# Patient Record
Sex: Female | Born: 1995 | Race: Black or African American | Hispanic: No | Marital: Single | Smoking: Never smoker
Health system: Southern US, Community
[De-identification: ages and names within clinical notes are randomized; demographics above are authoritative.]

## PROBLEM LIST (undated history)

## (undated) ENCOUNTER — Inpatient Hospital Stay (HOSPITAL_COMMUNITY): Payer: Self-pay

## (undated) DIAGNOSIS — M810 Age-related osteoporosis without current pathological fracture: Secondary | ICD-10-CM

## (undated) DIAGNOSIS — K56609 Unspecified intestinal obstruction, unspecified as to partial versus complete obstruction: Secondary | ICD-10-CM

## (undated) DIAGNOSIS — K5090 Crohn's disease, unspecified, without complications: Secondary | ICD-10-CM

## (undated) DIAGNOSIS — H209 Unspecified iridocyclitis: Secondary | ICD-10-CM

## (undated) DIAGNOSIS — E559 Vitamin D deficiency, unspecified: Secondary | ICD-10-CM

## (undated) DIAGNOSIS — D649 Anemia, unspecified: Secondary | ICD-10-CM

## (undated) DIAGNOSIS — Z3400 Encounter for supervision of normal first pregnancy, unspecified trimester: Secondary | ICD-10-CM

## (undated) HISTORY — DX: Vitamin D deficiency, unspecified: E55.9

## (undated) HISTORY — PX: WISDOM TOOTH EXTRACTION: SHX21

## (undated) HISTORY — PX: INCISION AND DRAINAGE: SHX5863

## (undated) HISTORY — DX: Unspecified intestinal obstruction, unspecified as to partial versus complete obstruction: K56.609

## (undated) HISTORY — DX: Unspecified iridocyclitis: H20.9

## (undated) HISTORY — PX: SMALL INTESTINE SURGERY: SHX150

---
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
## 2013-01-23 LAB — OB RESULTS CONSOLE GC/CHLAMYDIA
Chlamydia: NEGATIVE
Gonorrhea: NEGATIVE

## 2013-01-23 LAB — OB RESULTS CONSOLE ABO/RH: RH Type: POSITIVE

## 2013-01-23 LAB — OB RESULTS CONSOLE VARICELLA ZOSTER ANTIBODY, IGG: Varicella: IMMUNE

## 2013-01-23 LAB — OB RESULTS CONSOLE TSH: TSH: 2.19

## 2013-01-23 LAB — OB RESULTS CONSOLE HIV ANTIBODY (ROUTINE TESTING): HIV: NONREACTIVE

## 2013-01-23 LAB — OB RESULTS CONSOLE RPR: RPR: NONREACTIVE

## 2013-01-23 LAB — OB RESULTS CONSOLE ANTIBODY SCREEN: Antibody Screen: NEGATIVE

## 2013-01-23 LAB — OB RESULTS CONSOLE HEPATITIS B SURFACE ANTIGEN: Hepatitis B Surface Ag: NEGATIVE

## 2013-01-23 LAB — OB RESULTS CONSOLE PLATELET COUNT: Platelets: 226 10*3/uL

## 2013-01-23 LAB — OB RESULTS CONSOLE HGB/HCT, BLOOD
HCT: 33 %
Hemoglobin: 11 g/dL

## 2013-01-23 LAB — OB RESULTS CONSOLE RUBELLA ANTIBODY, IGM: Rubella: UNDETERMINED

## 2013-04-27 ENCOUNTER — Encounter (HOSPITAL_COMMUNITY): Payer: Self-pay | Admitting: Emergency Medicine

## 2013-04-27 ENCOUNTER — Inpatient Hospital Stay (EMERGENCY_DEPARTMENT_HOSPITAL)
Admission: AD | Admit: 2013-04-27 | Discharge: 2013-04-27 | Disposition: A | Discharge status: Home / Self Care | Payer: Medicaid - Out of State | Source: Ambulatory Visit | Attending: Obstetrics & Gynecology | Admitting: Obstetrics & Gynecology

## 2013-04-27 ENCOUNTER — Encounter (HOSPITAL_COMMUNITY): Payer: Self-pay | Admitting: *Deleted

## 2013-04-27 ENCOUNTER — Emergency Department (HOSPITAL_COMMUNITY)
Admission: EM | Admit: 2013-04-27 | Discharge: 2013-04-27 | Disposition: A | Discharge status: Home / Self Care | Payer: Medicaid - Out of State | Attending: Emergency Medicine | Admitting: Emergency Medicine

## 2013-04-27 DIAGNOSIS — Y833 Surgical operation with formation of external stoma as the cause of abnormal reaction of the patient, or of later complication, without mention of misadventure at the time of the procedure: Secondary | ICD-10-CM

## 2013-04-27 DIAGNOSIS — O4700 False labor before 37 completed weeks of gestation, unspecified trimester: Secondary | ICD-10-CM

## 2013-04-27 DIAGNOSIS — O9989 Other specified diseases and conditions complicating pregnancy, childbirth and the puerperium: Secondary | ICD-10-CM | POA: Insufficient documentation

## 2013-04-27 DIAGNOSIS — O99891 Other specified diseases and conditions complicating pregnancy: Secondary | ICD-10-CM | POA: Insufficient documentation

## 2013-04-27 DIAGNOSIS — K9403 Colostomy malfunction: Secondary | ICD-10-CM | POA: Insufficient documentation

## 2013-04-27 DIAGNOSIS — K9413 Enterostomy malfunction: Secondary | ICD-10-CM | POA: Insufficient documentation

## 2013-04-27 DIAGNOSIS — K5090 Crohn's disease, unspecified, without complications: Secondary | ICD-10-CM | POA: Insufficient documentation

## 2013-04-27 DIAGNOSIS — K91858 Other complications of intestinal pouch: Secondary | ICD-10-CM | POA: Insufficient documentation

## 2013-04-27 DIAGNOSIS — L259 Unspecified contact dermatitis, unspecified cause: Secondary | ICD-10-CM | POA: Insufficient documentation

## 2013-04-27 DIAGNOSIS — Z8719 Personal history of other diseases of the digestive system: Secondary | ICD-10-CM | POA: Insufficient documentation

## 2013-04-27 DIAGNOSIS — L309 Dermatitis, unspecified: Secondary | ICD-10-CM

## 2013-04-27 HISTORY — DX: Crohn's disease, unspecified, without complications: K50.90

## 2013-04-27 LAB — URINE MICROSCOPIC-ADD ON

## 2013-04-27 LAB — URINALYSIS, ROUTINE W REFLEX MICROSCOPIC
Bilirubin Urine: NEGATIVE
Glucose, UA: NEGATIVE mg/dL
Hgb urine dipstick: NEGATIVE
Ketones, ur: NEGATIVE mg/dL
Nitrite: NEGATIVE
Protein, ur: NEGATIVE mg/dL
Specific Gravity, Urine: 1.025 (ref 1.005–1.030)
Urobilinogen, UA: 0.2 mg/dL (ref 0.0–1.0)
pH: 6 (ref 5.0–8.0)

## 2013-04-27 MED ORDER — LIDOCAINE HCL 2 % EX GEL
Freq: Once | CUTANEOUS | Status: AC
  Administered 2013-04-27: 10 via TOPICAL
  Filled 2013-04-27: qty 10

## 2013-04-27 NOTE — ED Notes (Signed)
Pt c/o pain to ostomy site x 1 week. Pt evaluated at [facility], and sent here for further evaluation, pt is 27 weeks preg.

## 2013-04-27 NOTE — MAU Provider Note (Signed)
History     CSN: [id number]  Arrival date and time: 04/27/13 0027   None     No chief complaint on file.  HPI Jessica [name] is an 17yo female G1P0 at 27.1wks who presents for eval of concerns re stoma site. She has an ostomy due to Crohn's Dx and states that there is now leaking x 2wks and possible bldg near the stoma. Also c/o superficial discomfort in her right abd above the stoma. Denies dysuria, ctx, leak or bldg. She moved here from [facility] x 2wks ago and is awaiting [place] MCD prior to establishing care.  OB History   Grav Para Term Preterm Abortions TAB SAB Ect Mult Living   1               Past Medical History  Diagnosis Date  . Crohn's disease     Past Surgical History  Procedure Laterality Date  . Small intestine surgery      Family History  Problem Relation Age of Onset  . Diabetes Mother   . Heart disease Mother   . Hypertension Mother   . Kidney disease Mother   . Cancer Father   . Hyperlipidemia Father   . Stroke Neg Hx     History  Substance Use Topics  . Smoking status: Never Smoker   . Smokeless tobacco: Not on file  . Alcohol Use: No    Allergies: No Known Allergies  Prescriptions prior to admission  Medication Sig Dispense Refill  . Prenatal Vit-Fe Fumarate-FA (MULTIVITAMIN-PRENATAL) 27-0.8 MG TABS Take 1 tablet by mouth daily at 12 noon.        ROS Physical Exam   Blood pressure 118/73, pulse 97, temperature 98.2 F (36.8 C), temperature source Oral, resp. rate 18, height 5' 5"  (1.651 m), weight 180 lb (81.647 kg).  Physical Exam  Constitutional: She is oriented to person, place, and time. She appears well-developed.  HENT:  Head: Normocephalic.  Cardiovascular: Normal rate.   Respiratory: Effort normal.  GI: Soft.  FHR 135 +accels, no decels, occ mi variables; appropriate for gest age No ctx per toco  Ostomy bag in RUQ, stoma pink, sm bldg noted  Genitourinary: Vagina normal.  Cx closed/long/high  Musculoskeletal: Normal range of  motion.  Neurological: She is alert and oriented to person, place, and time.  Skin: Skin is warm and dry.  Psychiatric: She has a normal mood and affect. Her behavior is normal. Thought content normal.   Urinalysis    Component Value Date/Time   COLORURINE YELLOW 04/27/2013 0040   APPEARANCEUR CLEAR 04/27/2013 0040   LABSPEC 1.025 04/27/2013 0040   PHURINE 6.0 04/27/2013 0040   GLUCOSEU NEGATIVE 04/27/2013 0040   HGBUR NEGATIVE 04/27/2013 0040   BILIRUBINUR NEGATIVE 04/27/2013 0040   KETONESUR NEGATIVE 04/27/2013 0040   PROTEINUR NEGATIVE 04/27/2013 0040   UROBILINOGEN 0.2 04/27/2013 0040   NITRITE NEGATIVE 04/27/2013 0040   LEUKOCYTESUR SMALL* 04/27/2013 0040      MAU Course  Procedures  MDM Rev'd with pt and mother that this facility is not equipped both in personnel nor proper equipment to evaluate and treat concerns re her stoma. Preterm labor has been ruled out and it has been recommended that she seek care at [facility] or [facility] for complete eval of stoma. They are in agreement and they decline [facility] tx.  Assessment and Plan  IUP at 27.1wks Stoma with bleeding  D/C from [facility] and rec she seek care at [facility] or [facility]  ERs for stoma concerns [facility] soon as she has  MCD established.  Kersten Salmons 04/27/2013, 1:24 AM

## 2013-04-27 NOTE — MAU Note (Signed)
Pt reports leaking around stoma site for 2 weeks and noticed some bleeding from around stoma tonight. Pt reports some sharp pain srounf the stoma site for one week.

## 2013-04-27 NOTE — ED Notes (Signed)
Lidocaine jelly applied to skin surrounding stoma, as directed by Collier Salina, PA

## 2013-04-27 NOTE — ED Provider Notes (Signed)
History     CSN: [id number]  Arrival date & time 04/27/13  0218   First MD Initiated Contact with Patient 04/27/13 [phone]      Chief Complaint  Patient presents with  . Ostomy pain    HPI  History provided by the patient. Patient is 17 year old female currently [redacted] weeks pregnant with past history of Crohn's disease and right lower ileostomy who presents with complaints of leakage and pain around her ostomy site. Patient has had increased leakage with poor seal of her ostomy bag for the past week. She has had increased irritation around the skin and stoma. Pain is described as a burning pain is persistent. She does not take any medications for this. [facility]. She was evaluated for her pregnancy which appears normal without any concerns for preterm labor. She denies any diffuse abdominal pain, pelvic pain, vaginal bleeding or vaginal discharge. There has been no fever, chills or sweats. No other aggravating or alleviating factors. No other associated symptoms.    Past Medical History  Diagnosis Date  . Crohn's disease     Past Surgical History  Procedure Laterality Date  . Small intestine surgery      Family History  Problem Relation Age of Onset  . Diabetes Mother   . Heart disease Mother   . Hypertension Mother   . Kidney disease Mother   . Cancer Father   . Hyperlipidemia Father   . Stroke Neg Hx     History  Substance Use Topics  . Smoking status: Never Smoker   . Smokeless tobacco: Not on file  . Alcohol Use: No    OB History   Grav Para Term Preterm Abortions TAB SAB Ect Mult Living   1               Review of Systems  Constitutional: Negative for fever, chills and diaphoresis.  Gastrointestinal: Negative for nausea and vomiting.  Genitourinary: Negative for vaginal bleeding and vaginal discharge.  All other systems reviewed and are negative.    Allergies  Review of patient's allergies  indicates no known allergies.  Home Medications   Current Outpatient Rx  Name  Route  Sig  Dispense  Refill  . Prenatal Vit-Fe Fumarate-FA (MULTIVITAMIN-PRENATAL) 27-0.8 MG TABS   Oral   Take 1 tablet by mouth daily at 12 noon.           BP 120/78  Pulse 96  Temp(Src) 99.2 F (37.3 C) (Oral)  Resp 18  Wt 181 lb (82.101 kg)  BMI 30.12 kg/m2  SpO2 100%  Physical Exam  Nursing note and vitals reviewed. Constitutional: She is oriented to person, place, and time. She appears well-developed and well-nourished. No distress.  HENT:  Head: Normocephalic.  Cardiovascular: Normal rate and regular rhythm.   Pulmonary/Chest: Effort normal and breath sounds normal.  Abdominal: Soft. There is no tenderness. There is no rebound and no guarding.  Gravid. Ostomy to the right lower abdomen with normal drainage. There is irritation and stoma dermatitis at inferior aspect and some of the surrounding skin of abdomen. No signs concerning for cellulitis.  Neurological: She is alert and oriented to person, place, and time.  Skin: Skin is warm and dry. No rash noted.  Psychiatric: She has a normal mood and affect. Her behavior is normal.    ED Course  Procedures      1. Stoma dermatitis       MDM  3:05AM she seen and evaluated. Patient well-appearing in no acute distress        Martie Lee, PA-C 04/27/13 [phone]

## 2013-04-28 NOTE — ED Provider Notes (Signed)
Medical screening examination/treatment/procedure(s) were performed by non-physician practitioner and as supervising physician I was immediately available for consultation/collaboration.   Sharyon Cable, MD 04/28/13 [phone]

## 2013-05-02 ENCOUNTER — Encounter: Payer: Self-pay | Admitting: Obstetrics & Gynecology

## 2013-05-02 NOTE — MAU Provider Note (Signed)
Pt needs to start prenatal care ASAP.  Will get her an appt and also MFM consult.

## 2013-05-03 ENCOUNTER — Ambulatory Visit (INDEPENDENT_AMBULATORY_CARE_PROVIDER_SITE_OTHER): Payer: Medicaid Other | Admitting: Obstetrics & Gynecology

## 2013-05-03 ENCOUNTER — Encounter: Payer: Self-pay | Admitting: Obstetrics & Gynecology

## 2013-05-03 VITALS — BP 116/79 | Temp 98.4°F | Wt 179.0 lb

## 2013-05-03 DIAGNOSIS — Z3400 Encounter for supervision of normal first pregnancy, unspecified trimester: Secondary | ICD-10-CM

## 2013-05-03 DIAGNOSIS — Z3403 Encounter for supervision of normal first pregnancy, third trimester: Secondary | ICD-10-CM

## 2013-05-03 DIAGNOSIS — Z932 Ileostomy status: Secondary | ICD-10-CM

## 2013-05-03 DIAGNOSIS — K5090 Crohn's disease, unspecified, without complications: Secondary | ICD-10-CM

## 2013-05-03 HISTORY — DX: Encounter for supervision of normal first pregnancy, unspecified trimester: Z34.00

## 2013-05-03 LAB — POCT URINALYSIS DIPSTICK
Bilirubin, UA: NEGATIVE
Blood, UA: NEGATIVE
Glucose, UA: NEGATIVE
Ketones, UA: NEGATIVE
Nitrite, UA: NEGATIVE
Protein, UA: NEGATIVE
Spec Grav, UA: 1.015
Urobilinogen, UA: NEGATIVE
pH, UA: 5

## 2013-05-03 NOTE — Progress Notes (Signed)
Doing well 

## 2013-05-03 NOTE — Patient Instructions (Addendum)
Glucose Tolerance Test This is a test to see how your body processes carbohydrates. This test is often done to check patients for diabetes or the possibility of developing it. PREPARATION FOR TEST You should have nothing to eat or drink 12 hours before the test. You will be given a form of sugar (glucose) and then blood samples will be drawn from your vein to determine the level of sugar in your blood. Alternatively, blood may be drawn from your finger for testing. You should not smoke or exercise during the test. NORMAL FINDINGS  Fasting: 70-115 mg/dL  30 minutes: less than 200 mg/dL  1 hour: less than 200 mg/dL  2 hours: less than 140 mg/dL  3 hours: 70-115 mg/dL  4 hours: 70-115 mg/dL Ranges for normal findings may vary among different laboratories and hospitals. You should always check with your doctor after having lab work or other tests done to discuss the meaning of your test results and whether your values are considered within normal limits. MEANING OF TEST Your caregiver will go over the test results with you and discuss the importance and meaning of your results, as well as treatment options and the need for additional tests. OBTAINING THE TEST RESULTS It is your responsibility to obtain your test results. Ask the lab or department performing the test when and how you will get your results. Document Released: 12/28/2004 Document Revised: 02/27/2012 Document Reviewed: 11/15/2008 [facility] Patient Information 2013 [place].

## 2013-05-03 NOTE — Progress Notes (Signed)
Pulse- 94  . Subjective:    MADA SADIK is being seen today for her first obstetrical visit.  This is not a planned pregnancy. She is at 33w0dgestation.  Relationship with FOB: significant other, not living together. Patient does not intend to breast feed. Pregnancy history fully reviewed.  Menstrual History: OB History   Grav Para Term Preterm Abortions TAB SAB Ect Mult Living   1               Menarche age: [age]No LMP recorded. Patient is pregnant.    The following portions of the patient's history were reviewed and updated as appropriate: allergies, current medications, past family history, past medical history, past social history, past surgical history and problem list.  Review of Systems Pertinent items are noted in HPI.    Objective:     Abd: ileostomy present      Assessment:    Pregnancy at 265w0deeks   H/O Crohn's disease w/an ileostomy; disease is quiescent; no medications Plan:   Review previous records Counseled re: TDAP/2 hr GTT Problem list reviewed and updated. Referral-->MFM U/S for growth ?Delivery route Follow up in 2 weeks. 50% of 20 min visit spent on counseling and coordination of care.

## 2013-05-06 ENCOUNTER — Encounter: Payer: Self-pay | Admitting: *Deleted

## 2013-05-06 ENCOUNTER — Encounter: Payer: Self-pay | Admitting: Obstetrics & Gynecology

## 2013-05-06 ENCOUNTER — Other Ambulatory Visit: Payer: Self-pay | Admitting: Obstetrics & Gynecology

## 2013-05-06 DIAGNOSIS — Z09 Encounter for follow-up examination after completed treatment for conditions other than malignant neoplasm: Secondary | ICD-10-CM

## 2013-05-06 NOTE — Progress Notes (Signed)
U/S SCHEDULED @ [facility]OM[facility] FOR 05/09/13

## 2013-05-08 ENCOUNTER — Other Ambulatory Visit: Payer: Medicaid Other | Admitting: *Deleted

## 2013-05-08 DIAGNOSIS — Z3402 Encounter for supervision of normal first pregnancy, second trimester: Secondary | ICD-10-CM

## 2013-05-08 DIAGNOSIS — Z3400 Encounter for supervision of normal first pregnancy, unspecified trimester: Secondary | ICD-10-CM

## 2013-05-08 LAB — CBC
HCT: 37 % (ref 36.0–46.0)
Hemoglobin: 12.4 g/dL (ref 12.0–15.0)
MCH: 28.6 pg (ref 26.0–34.0)
MCHC: 33.5 g/dL (ref 30.0–36.0)
MCV: 85.5 fL (ref 78.0–100.0)
Platelets: 220 10*3/uL (ref 150–400)
RBC: 4.33 MIL/uL (ref 3.87–5.11)
RDW: 16.3 % — ABNORMAL HIGH (ref 11.5–15.5)
WBC: 8.4 10*3/uL (ref 4.0–10.5)

## 2013-05-09 ENCOUNTER — Ambulatory Visit (HOSPITAL_COMMUNITY)
Admission: RE | Admit: 2013-05-09 | Discharge: 2013-05-09 | Disposition: A | Discharge status: Home / Self Care | Payer: Medicaid Other | Source: Ambulatory Visit | Attending: Obstetrics & Gynecology | Admitting: Obstetrics & Gynecology

## 2013-05-09 ENCOUNTER — Other Ambulatory Visit: Payer: Self-pay | Admitting: Obstetrics & Gynecology

## 2013-05-09 DIAGNOSIS — Z09 Encounter for follow-up examination after completed treatment for conditions other than malignant neoplasm: Secondary | ICD-10-CM

## 2013-05-09 DIAGNOSIS — Z3403 Encounter for supervision of normal first pregnancy, third trimester: Secondary | ICD-10-CM

## 2013-05-09 DIAGNOSIS — O99891 Other specified diseases and conditions complicating pregnancy: Secondary | ICD-10-CM | POA: Insufficient documentation

## 2013-05-09 DIAGNOSIS — Z932 Ileostomy status: Secondary | ICD-10-CM

## 2013-05-09 DIAGNOSIS — Z3689 Encounter for other specified antenatal screening: Secondary | ICD-10-CM | POA: Insufficient documentation

## 2013-05-09 DIAGNOSIS — K5090 Crohn's disease, unspecified, without complications: Secondary | ICD-10-CM | POA: Insufficient documentation

## 2013-05-09 LAB — RPR

## 2013-05-09 LAB — GLUCOSE TOLERANCE, 2 HOURS W/ 1HR
Glucose, 1 hour: 118 mg/dL (ref 70–170)
Glucose, 2 hour: 95 mg/dL (ref 70–139)
Glucose, Fasting: 52 mg/dL — ABNORMAL LOW (ref 70–99)

## 2013-05-09 LAB — HIV ANTIBODY (ROUTINE TESTING W REFLEX): HIV: NONREACTIVE

## 2013-05-14 ENCOUNTER — Institutional Professional Consult (permissible substitution): Payer: Medicaid Other

## 2013-05-15 ENCOUNTER — Encounter: Payer: Self-pay | Admitting: Obstetrics

## 2013-05-15 ENCOUNTER — Ambulatory Visit (INDEPENDENT_AMBULATORY_CARE_PROVIDER_SITE_OTHER): Payer: Medicaid Other | Admitting: Obstetrics

## 2013-05-15 ENCOUNTER — Telehealth: Payer: Self-pay | Admitting: *Deleted

## 2013-05-15 VITALS — BP 128/84 | Temp 98.4°F | Wt 181.4 lb

## 2013-05-15 DIAGNOSIS — Z3403 Encounter for supervision of normal first pregnancy, third trimester: Secondary | ICD-10-CM

## 2013-05-15 DIAGNOSIS — Z3400 Encounter for supervision of normal first pregnancy, unspecified trimester: Secondary | ICD-10-CM

## 2013-05-15 LAB — POCT URINALYSIS DIPSTICK
Bilirubin, UA: NEGATIVE
Blood, UA: NEGATIVE
Glucose, UA: NEGATIVE
Ketones, UA: NEGATIVE
Nitrite, UA: NEGATIVE
Spec Grav, UA: 1.02
Urobilinogen, UA: NEGATIVE
pH, UA: 5

## 2013-05-15 MED ORDER — OB COMPLETE PETITE 35-5-1-200 MG PO CAPS: 1.0000 | ORAL_CAPSULE | Freq: Every day | ORAL | Status: DC

## 2013-05-15 NOTE — Telephone Encounter (Signed)
error 

## 2013-05-15 NOTE — Progress Notes (Signed)
Pulse-105

## 2013-06-05 ENCOUNTER — Inpatient Hospital Stay (HOSPITAL_COMMUNITY)
Admission: AD | Admit: 2013-06-05 | Discharge: 2013-06-06 | Disposition: A | Discharge status: Home / Self Care | Payer: Medicaid Other | Source: Ambulatory Visit | Attending: Obstetrics & Gynecology | Admitting: Obstetrics & Gynecology

## 2013-06-05 ENCOUNTER — Encounter (HOSPITAL_COMMUNITY): Payer: Self-pay | Admitting: *Deleted

## 2013-06-05 DIAGNOSIS — R109 Unspecified abdominal pain: Secondary | ICD-10-CM | POA: Insufficient documentation

## 2013-06-05 DIAGNOSIS — B3731 Acute candidiasis of vulva and vagina: Secondary | ICD-10-CM | POA: Insufficient documentation

## 2013-06-05 DIAGNOSIS — O26899 Other specified pregnancy related conditions, unspecified trimester: Secondary | ICD-10-CM

## 2013-06-05 DIAGNOSIS — O2390 Unspecified genitourinary tract infection in pregnancy, unspecified trimester: Secondary | ICD-10-CM | POA: Insufficient documentation

## 2013-06-05 DIAGNOSIS — Z932 Ileostomy status: Secondary | ICD-10-CM

## 2013-06-05 DIAGNOSIS — B373 Candidiasis of vulva and vagina: Secondary | ICD-10-CM

## 2013-06-05 DIAGNOSIS — O212 Late vomiting of pregnancy: Secondary | ICD-10-CM | POA: Insufficient documentation

## 2013-06-05 NOTE — MAU Note (Signed)
PT SAYS SHE HAS AN APPOINTMENT AT 5 PM TOMORROW.   PT SAYS SHE HAS BEEN HAVING BAD LOWER ABD PAIN -  STARTED 2 WEEKS AGO-   HAS BEEN VOMITING  X1 WEEK- VERY SPIRADIC.   PT HAS ILEOSTOMY BAG- HAS CHRON'S  DISEASE.    LAST SEEN IN OFFICE - 2 WEEKS AGO- NO S/S  THEN.     TODAY-  NO VOMITING.   YESTERDAY-  VOMITED X2

## 2013-06-05 NOTE — MAU Provider Note (Signed)
History     CSN: [id number]  Arrival date and time: 06/05/13 2309   First Provider Initiated Contact with Patient 06/05/13 2341      No chief complaint on file.  HPI Ms. Jessica [name] is a 17 y.o. G1P0 at 53w6dwho presents to MAU today with N/V and abdominal cramping x 1 week. The patient states that the cramps are different from what she is used to with Crohns. She states some mucus discharge with blood on Thursday or Friday of last week that she thinks was a mucus plug. She states vomiting x 2 yesterday and none today. She denies bleeding since. She denies other discharge, LOF, contractions, fever, diarrhea, constipation of UTI symptoms. She reports good fetal movement.   OB History   Grav Para Term Preterm Abortions TAB SAB Ect Mult Living   1               Past Medical History  Diagnosis Date  . Crohn's disease     Past Surgical History  Procedure Laterality Date  . Small intestine surgery      Family History  Problem Relation Age of Onset  . Diabetes Father   . Stroke Neg Hx   . Diabetes Maternal Grandmother   . Heart disease Maternal Grandmother   . Hypertension Maternal Grandmother   . Kidney disease Maternal Grandmother   . Cancer Maternal Grandfather     lung  . Hyperlipidemia Maternal Grandfather     History  Substance Use Topics  . Smoking status: Never Smoker   . Smokeless tobacco: Not on file  . Alcohol Use: No    Allergies: No Known Allergies  Prescriptions prior to admission  Medication Sig Dispense Refill  . Prenat-FeCbn-FeAspGl-FA-Omega (OB COMPLETE PETITE) 35-5-1-200 MG CAPS Take 1 capsule by mouth daily before breakfast.  90 capsule  3  . Prenatal Vit-Fe Fumarate-FA (MULTIVITAMIN-PRENATAL) 27-0.8 MG TABS Take 1 tablet by mouth daily at 12 noon.        Review of Systems  Constitutional: Negative for fever and malaise/fatigue.  Gastrointestinal: Positive for nausea, vomiting and abdominal pain. Negative for constipation.   Genitourinary: Negative for dysuria, urgency and frequency.       + vaginal bleeding, discharge Neg - LOF   Physical Exam   Blood pressure 113/82, pulse 88, temperature 99.1 F (37.3 C), temperature source Oral, resp. rate 20, height 5' 6"  (1.676 m), weight 188 lb 2 oz (85.333 kg).  Physical Exam  Constitutional: She is oriented to person, place, and time. She appears well-developed and well-nourished. No distress.  HENT:  Head: Normocephalic and atraumatic.  Cardiovascular: Normal rate, regular rhythm and normal heart sounds.   Respiratory: Effort normal and breath sounds normal. No respiratory distress.  GI: Soft. Bowel sounds are normal. She exhibits no distension and no mass. Tenderness: very mild tenderness to palpation of the lower abdomen. There is no rebound and no guarding.  Genitourinary: Uterus is enlarged (appropriate for GA). Uterus is not tender. Cervix exhibits discharge (scant mucus discharge noted) and friability. Cervix exhibits no motion tenderness. No bleeding around the vagina. Vaginal discharge (moderate amount of thick, white discharge) found.  Neurological: She is alert and oriented to person, place, and time.  Skin: Skin is warm and dry. No erythema.  Psychiatric: She has a normal mood and affect.  Dilation: Closed Effacement (%): Thick Exam by:: Ashaya Raftery, PA  Results for orders placed during the hospital encounter of 06/05/13 (from the past 24 hour(s))  URINALYSIS, ROUTINE  W REFLEX MICROSCOPIC     Status: Abnormal   Collection Time    06/05/13 11:31 PM      Result Value Range   Color, Urine YELLOW  YELLOW   APPearance CLEAR  CLEAR   Specific Gravity, Urine 1.025  1.005 - 1.030   pH 6.0  5.0 - 8.0   Glucose, UA NEGATIVE  NEGATIVE mg/dL   Hgb urine dipstick NEGATIVE  NEGATIVE   Bilirubin Urine NEGATIVE  NEGATIVE   Ketones, ur 15 (*) NEGATIVE mg/dL   Protein, ur NEGATIVE  NEGATIVE mg/dL   Urobilinogen, UA 0.2  0.0 - 1.0 mg/dL   Nitrite NEGATIVE  NEGATIVE    Leukocytes, UA NEGATIVE  NEGATIVE  WET PREP, GENITAL     Status: Abnormal   Collection Time    06/05/13 11:55 PM      Result Value Range   Yeast Wet Prep HPF POC FEW (*) NONE SEEN   Trich, Wet Prep NONE SEEN  NONE SEEN   Clue Cells Wet Prep HPF POC NONE SEEN  NONE SEEN   WBC, Wet Prep HPF POC MODERATE (*) NONE SEEN    Fetal Monitoring: Baseline: 125 bpm, moderate variability, + accelerations, no decelerations Contractions: None  MAU Course  Procedures None  MDM UA, Wet prep, GC/Chlamydia today Diflucan given in MAU  Assessment and Plan  A: Nausea and vomiting in pregnancy Yeast vulvovaginitis  P: Discharge home Rx for Zofran sent to patient's pharmacy Patient encouraged to increase PO hydration as tolerated Patient encouraged to keep prenatal appointment for tomorrow Patient may return to MAU as needed  Farris Has, PA-C  06/06/2013, 12:24 AM

## 2013-06-06 ENCOUNTER — Ambulatory Visit (INDEPENDENT_AMBULATORY_CARE_PROVIDER_SITE_OTHER): Payer: Medicaid Other | Admitting: Obstetrics & Gynecology

## 2013-06-06 ENCOUNTER — Encounter: Payer: Self-pay | Admitting: Obstetrics & Gynecology

## 2013-06-06 VITALS — BP 119/79 | Temp 98.8°F | Wt 185.6 lb

## 2013-06-06 DIAGNOSIS — Z3400 Encounter for supervision of normal first pregnancy, unspecified trimester: Secondary | ICD-10-CM

## 2013-06-06 DIAGNOSIS — Z3403 Encounter for supervision of normal first pregnancy, third trimester: Secondary | ICD-10-CM

## 2013-06-06 LAB — URINALYSIS, ROUTINE W REFLEX MICROSCOPIC
Bilirubin Urine: NEGATIVE
Glucose, UA: NEGATIVE mg/dL
Hgb urine dipstick: NEGATIVE
Ketones, ur: 15 mg/dL — AB
Leukocytes, UA: NEGATIVE
Nitrite: NEGATIVE
Protein, ur: NEGATIVE mg/dL
Specific Gravity, Urine: 1.025 (ref 1.005–1.030)
Urobilinogen, UA: 0.2 mg/dL (ref 0.0–1.0)
pH: 6 (ref 5.0–8.0)

## 2013-06-06 LAB — POCT URINALYSIS DIPSTICK
Bilirubin, UA: NEGATIVE
Blood, UA: NEGATIVE
Glucose, UA: NEGATIVE
Ketones, UA: NEGATIVE
Leukocytes, UA: NEGATIVE
Nitrite, UA: NEGATIVE
Spec Grav, UA: 1.015
Urobilinogen, UA: NEGATIVE
pH, UA: 5

## 2013-06-06 LAB — WET PREP, GENITAL
Clue Cells Wet Prep HPF POC: NONE SEEN
Trich, Wet Prep: NONE SEEN

## 2013-06-06 MED ORDER — ONDANSETRON HCL 4 MG PO TABS: 4.0000 mg | ORAL_TABLET | Freq: Four times a day (QID) | ORAL | Status: DC

## 2013-06-06 MED ORDER — FLUCONAZOLE 150 MG PO TABS
150.0000 mg | ORAL_TABLET | Freq: Once | ORAL | Status: AC
  Administered 2013-06-06: 150 mg via ORAL
  Filled 2013-06-06: qty 1

## 2013-06-06 NOTE — Progress Notes (Signed)
Doing well 

## 2013-06-06 NOTE — Patient Instructions (Signed)

## 2013-06-06 NOTE — Progress Notes (Signed)
Pulse- 105.  Patient was treated yesterday for a yeast infection and dehydration at [facility].

## 2013-06-11 ENCOUNTER — Encounter: Payer: Self-pay | Admitting: Obstetrics & Gynecology

## 2013-06-20 ENCOUNTER — Ambulatory Visit (INDEPENDENT_AMBULATORY_CARE_PROVIDER_SITE_OTHER): Payer: Medicaid Other | Admitting: Obstetrics & Gynecology

## 2013-06-20 VITALS — BP 126/81 | Temp 98.6°F | Wt 189.0 lb

## 2013-06-20 DIAGNOSIS — Z3400 Encounter for supervision of normal first pregnancy, unspecified trimester: Secondary | ICD-10-CM

## 2013-06-20 DIAGNOSIS — Z3403 Encounter for supervision of normal first pregnancy, third trimester: Secondary | ICD-10-CM

## 2013-06-20 LAB — POCT URINALYSIS DIPSTICK
Bilirubin, UA: NEGATIVE
Blood, UA: NEGATIVE
Glucose, UA: NEGATIVE
Ketones, UA: NEGATIVE
Nitrite, UA: NEGATIVE
Protein, UA: NEGATIVE
Spec Grav, UA: 1.02
Urobilinogen, UA: NEGATIVE
pH, UA: 6

## 2013-06-20 NOTE — Patient Instructions (Addendum)
Patient information: Group B streptococcus and pregnancy (Beyond the Basics)  Authors Craig Guess, MD, PhD Arta Silence, MD Section Editors Kathrin Greathouse, MD Alton Revere, MD Deputy Editor Mardelle Matte, MD Disclosures  All topics are updated as new evidence becomes available and our peer review process is complete.  Literature review current through: Feb 2014.  This topic last updated: Jun 18, 2012.  INTRODUCTION - Group B streptococcus (GBS) is a bacterium that can cause serious infections in pregnant women and newborn babies. GBS is one of many types of streptococcal bacteria, sometimes called "strep." This article discusses GBS, its effect on pregnant women and infants, and ways to prevent complications of GBS. More detailed information about GBS is available by subscription. (See "Group B streptococcal infection in pregnant women".) WHAT IS GROUP B STREP INFECTION? - GBS is commonly found in the digestive system and the vagina. In healthy adults, GBS is not harmful and does not cause problems. But in pregnant women and newborn infants, being infected with GBS can cause serious illness. Approximately one in three to four pregnant women in the [place] carries GBS in their gastrointestinal system and/or in their vagina. Carrying GBS is not the same as being infected. Carriers are not sick and do not need treatment during pregnancy. There is no treatment that can stop you from carrying GBS.  Pregnant women who are carriers of GBS infrequently become infected with GBS. GBS can cause urinary tract infections, infection of the amniotic fluid (bag of water), and infection of the uterus after delivery. GBS infections during pregnancy may lead to preterm labor.  Pregnant women who carry GBS can pass on the bacteria to their newborns, and some of those babies become infected with GBS. Newborns who are infected with GBS can develop pneumonia (lung infection), septicemia (blood infection), or  meningitis (infection of the lining of the brain and spinal cord). These complications can be prevented by giving intravenous antibiotics during labor to any woman who is at risk of GBS infection. You are at risk of GBS infection if: You have a urine culture during your current pregnancy showing GBS  You have a vaginal and rectal culture during your current pregnancy showing GBS  You had an infant infected with GBS in the past GROUP B STREP PREVENTION - Most doctors and nurses recommend a urine culture early in your pregnancy to be sure that you do not have a bladder infection without symptoms. If you urine culture shows GBS or other bacteria, you may be treated with an antibiotic. If you have symptoms of urinary infection, such as pain with urination, any time during your pregnancy, a urine culture is done. If GBS grows from the urine culture, it should be treated with an antibiotic, and you should also receive intravenous antibiotics during labor. Expert groups recommend that all pregnant women have a GBS culture at 35 to 37 weeks of pregnancy. The culture is done by swabbing the vagina and rectum. If your GBS culture is positive, you will be given an intravenous antibiotic during labor. If you have preterm labor, the culture is done then and an intravenous antibiotic is given until the baby is born or the labor is stopped by your health care provider. If you have a positive GBS culture and you have an allergy to penicillin, be sure your doctor and nurse are aware of this allergy and tell them what happened with the allergy. If you had only a rash or itching, this  is not a serious allergy, and you can receive a common drug related to the penicillin. If you had a serious allergy (for example, trouble breathing, swelling of your face) you may need an additional test to determine which antibiotic should be used during labor. Being treated with an antibiotic during labor greatly reduces the chance that you or  your newborn will develop infections related to GBS. It is important to note that young infants up to age 19 months can also develop septicemia, meningitis and other serious infections from GBS. Being treated with an antibiotic during labor does not reduce the chance that your baby will develop this later type of infection. There is currently no known way of preventing this later-onset GBS disease. WHERE TO GET MORE INFORMATION - Your healthcare provider is the best source of information for questions and concerns related to your medical problem.

## 2013-06-20 NOTE — Progress Notes (Signed)
Pulse-92 No complaints

## 2013-06-24 ENCOUNTER — Encounter: Payer: Self-pay | Admitting: Obstetrics & Gynecology

## 2013-06-24 NOTE — Progress Notes (Signed)
Doing well 

## 2013-06-25 ENCOUNTER — Encounter: Payer: Self-pay | Admitting: Obstetrics

## 2013-06-25 ENCOUNTER — Ambulatory Visit (INDEPENDENT_AMBULATORY_CARE_PROVIDER_SITE_OTHER): Payer: Medicaid Other | Admitting: Obstetrics

## 2013-06-25 ENCOUNTER — Institutional Professional Consult (permissible substitution): Payer: Medicaid Other

## 2013-06-25 VITALS — BP 138/90 | Temp 98.3°F | Wt 190.0 lb

## 2013-06-25 DIAGNOSIS — Z3403 Encounter for supervision of normal first pregnancy, third trimester: Secondary | ICD-10-CM

## 2013-06-25 DIAGNOSIS — Z3400 Encounter for supervision of normal first pregnancy, unspecified trimester: Secondary | ICD-10-CM

## 2013-06-25 LAB — POCT URINALYSIS DIPSTICK
Bilirubin, UA: NEGATIVE
Blood, UA: NEGATIVE
Glucose, UA: NEGATIVE
Ketones, UA: NEGATIVE
Leukocytes, UA: NEGATIVE
Nitrite, UA: NEGATIVE
Spec Grav, UA: 1.015
Urobilinogen, UA: NEGATIVE
pH, UA: 7.5

## 2013-06-25 NOTE — Progress Notes (Signed)
Pulse-89 Pt c/o abdominal pain, pink after wiping this morning, and mucous discharge.

## 2013-06-25 NOTE — Progress Notes (Signed)
126/87 

## 2013-06-26 ENCOUNTER — Encounter: Payer: Self-pay | Admitting: Obstetrics & Gynecology

## 2013-06-27 ENCOUNTER — Encounter: Payer: Self-pay | Admitting: Obstetrics & Gynecology

## 2013-06-27 LAB — STREP B DNA PROBE: GBSP: NEGATIVE

## 2013-07-04 ENCOUNTER — Ambulatory Visit (INDEPENDENT_AMBULATORY_CARE_PROVIDER_SITE_OTHER): Payer: Medicaid Other | Admitting: Obstetrics & Gynecology

## 2013-07-04 VITALS — BP 133/89 | Temp 98.8°F | Wt 194.0 lb

## 2013-07-04 DIAGNOSIS — Z23 Encounter for immunization: Secondary | ICD-10-CM

## 2013-07-04 DIAGNOSIS — Z3400 Encounter for supervision of normal first pregnancy, unspecified trimester: Secondary | ICD-10-CM

## 2013-07-04 DIAGNOSIS — Z3403 Encounter for supervision of normal first pregnancy, third trimester: Secondary | ICD-10-CM

## 2013-07-04 LAB — POCT URINALYSIS DIPSTICK
Blood, UA: NEGATIVE
Glucose, UA: NEGATIVE
Ketones, UA: NEGATIVE
Nitrite, UA: NEGATIVE
Spec Grav, UA: 1.02
Urobilinogen, UA: NEGATIVE
pH, UA: 5

## 2013-07-04 NOTE — Progress Notes (Signed)
Pulse- 96

## 2013-07-08 ENCOUNTER — Encounter: Payer: Self-pay | Admitting: Obstetrics & Gynecology

## 2013-07-08 ENCOUNTER — Inpatient Hospital Stay (HOSPITAL_COMMUNITY): Payer: Medicaid Other

## 2013-07-08 ENCOUNTER — Ambulatory Visit (INDEPENDENT_AMBULATORY_CARE_PROVIDER_SITE_OTHER): Payer: Medicaid Other | Admitting: Obstetrics & Gynecology

## 2013-07-08 ENCOUNTER — Encounter (HOSPITAL_COMMUNITY): Payer: Self-pay | Admitting: *Deleted

## 2013-07-08 ENCOUNTER — Inpatient Hospital Stay (HOSPITAL_COMMUNITY)
Admission: AD | Admit: 2013-07-08 | Discharge: 2013-07-08 | Disposition: A | Discharge status: Home / Self Care | Payer: Medicaid Other | Source: Ambulatory Visit | Attending: Obstetrics | Admitting: Obstetrics

## 2013-07-08 VITALS — BP 137/96 | Temp 98.7°F | Wt 192.2 lb

## 2013-07-08 DIAGNOSIS — Z3400 Encounter for supervision of normal first pregnancy, unspecified trimester: Secondary | ICD-10-CM

## 2013-07-08 DIAGNOSIS — IMO0001 Reserved for inherently not codable concepts without codable children: Secondary | ICD-10-CM

## 2013-07-08 DIAGNOSIS — R51 Headache: Secondary | ICD-10-CM | POA: Insufficient documentation

## 2013-07-08 DIAGNOSIS — Z3403 Encounter for supervision of normal first pregnancy, third trimester: Secondary | ICD-10-CM

## 2013-07-08 DIAGNOSIS — O99891 Other specified diseases and conditions complicating pregnancy: Secondary | ICD-10-CM | POA: Insufficient documentation

## 2013-07-08 DIAGNOSIS — N949 Unspecified condition associated with female genital organs and menstrual cycle: Secondary | ICD-10-CM | POA: Insufficient documentation

## 2013-07-08 DIAGNOSIS — R030 Elevated blood-pressure reading, without diagnosis of hypertension: Secondary | ICD-10-CM

## 2013-07-08 LAB — US OB DETAIL + 14 WK

## 2013-07-08 LAB — CBC
HCT: 34.4 % — ABNORMAL LOW (ref 36.0–46.0)
Hemoglobin: 11.9 g/dL — ABNORMAL LOW (ref 12.0–15.0)
MCH: 29.9 pg (ref 26.0–34.0)
MCHC: 34.6 g/dL (ref 30.0–36.0)
MCV: 86.4 fL (ref 78.0–100.0)
Platelets: 188 10*3/uL (ref 150–400)
RBC: 3.98 MIL/uL (ref 3.87–5.11)
RDW: 13.8 % (ref 11.5–15.5)
WBC: 8.6 10*3/uL (ref 4.0–10.5)

## 2013-07-08 LAB — POCT URINALYSIS DIPSTICK
Bilirubin, UA: NEGATIVE
Blood, UA: NEGATIVE
Glucose, UA: NEGATIVE
Ketones, UA: NEGATIVE
Nitrite, UA: NEGATIVE
Spec Grav, UA: 1.02
Urobilinogen, UA: NEGATIVE
pH, UA: 6

## 2013-07-08 LAB — COMPREHENSIVE METABOLIC PANEL
ALT: 9 U/L (ref 0–35)
AST: 16 U/L (ref 0–37)
Albumin: 2.8 g/dL — ABNORMAL LOW (ref 3.5–5.2)
Alkaline Phosphatase: 129 U/L — ABNORMAL HIGH (ref 39–117)
BUN: 6 mg/dL (ref 6–23)
CO2: 19 mEq/L (ref 19–32)
Calcium: 9 mg/dL (ref 8.4–10.5)
Chloride: 102 mEq/L (ref 96–112)
Creatinine, Ser: 0.43 mg/dL — ABNORMAL LOW (ref 0.50–1.10)
GFR calc Af Amer: >90 mL/min (ref >90)
GFR calc non Af Amer: >90 mL/min (ref >90)
Glucose, Bld: 77 mg/dL (ref 70–99)
Potassium: 3.5 mEq/L (ref 3.5–5.1)
Sodium: 134 mEq/L — ABNORMAL LOW (ref 135–145)
Total Bilirubin: 0.2 mg/dL — ABNORMAL LOW (ref 0.3–1.2)
Total Protein: 6.5 g/dL (ref 6.0–8.3)

## 2013-07-08 LAB — PROTEIN / CREATININE RATIO, URINE
Creatinine, Urine: 263.68 mg/dL
Protein Creatinine Ratio: 0.16 — ABNORMAL HIGH (ref 0.00–0.15)
Total Protein, Urine: 43.1 mg/dL

## 2013-07-08 LAB — WET PREP, GENITAL
Clue Cells Wet Prep HPF POC: NONE SEEN
Trich, Wet Prep: NONE SEEN
Yeast Wet Prep HPF POC: NONE SEEN

## 2013-07-08 LAB — LACTATE DEHYDROGENASE: LDH: 125 U/L (ref 94–250)

## 2013-07-08 NOTE — MAU Provider Note (Signed)
Jessica [name] is a 17 y.o. G1P0 at 76w3dwho presents to MAU today from the office for evaluation of ROM and possible PIH. The patient complains of headache, but denies RUQ pain, peripheral edema, blurred vision, contractions or vaginal bleeding.   BP 120/85  Pulse 95  Temp(Src) 98.7 F (37.1 C)  Resp 16  Ht 5' 6.5" (1.689 m)  Wt 192 lb (87.091 kg)  BMI 30.53 kg/m2  SpO2 100% GENERAL: Well-developed, well-nourished female in no acute distress.  HEENT: Normocephalic, atraumatic.   LUNGS: Effort normal HEART: Regular rate  SKIN: Warm, dry and without erythema PSYCH: Normal mood and affect PELVIC: Normal external genitalia. Vagina is pink and ruggatted. Small amount of thin, white discharge noted.   Negative - pooling Negative - ferning  Fetal Monitoring: Baseline: 120 bpm, moderate variability, + accelerations, no decelerations Contractions: none  Results for orders placed during the hospital encounter of 07/08/13 (from the past 24 hour(s))  PROTEIN / CREATININE RATIO, URINE     Status: Abnormal   Collection Time    07/08/13 10:52 AM      Result Value Range   Creatinine, Urine 263.68     Total Protein, Urine 43.1     PROTEIN CREATININE RATIO 0.16 (*) 0.00 - 0.15  CBC     Status: Abnormal   Collection Time    07/08/13 11:30 AM      Result Value Range   WBC 8.6  4.0 - 10.5 K/uL   RBC 3.98  3.87 - 5.11 MIL/uL   Hemoglobin 11.9 (*) 12.0 - 15.0 g/dL   HCT 34.4 (*) 36.0 - 46.0 %   MCV 86.4  78.0 - 100.0 fL   MCH 29.9  26.0 - 34.0 pg   MCHC 34.6  30.0 - 36.0 g/dL   RDW 13.8  11.5 - 15.5 %   Platelets 188  150 - 400 K/uL  COMPREHENSIVE METABOLIC PANEL     Status: Abnormal   Collection Time    07/08/13 11:30 AM      Result Value Range   Sodium 134 (*) 135 - 145 mEq/L   Potassium 3.5  3.5 - 5.1 mEq/L   Chloride 102  96 - 112 mEq/L   CO2 19  19 - 32 mEq/L   Glucose, Bld 77  70 - 99 mg/dL   BUN 6  6 - 23 mg/dL   Creatinine, Ser 0.43 (*) 0.50 - 1.10 mg/dL   Calcium  9.0  8.4 - 10.5 mg/dL   Total Protein 6.5  6.0 - 8.3 g/dL   Albumin 2.8 (*) 3.5 - 5.2 g/dL   AST 16  0 - 37 U/L   ALT 9  0 - 35 U/L   Alkaline Phosphatase 129 (*) 39 - 117 U/L   Total Bilirubin 0.2 (*) 0.3 - 1.2 mg/dL   GFR calc non Af Amer >90  >90 mL/min   GFR calc Af Amer >90  >90 mL/min  LACTATE DEHYDROGENASE     Status: None   Collection Time    07/08/13 11:30 AM      Result Value Range   LDH 125  94 - 250 U/L  WET PREP, GENITAL     Status: Abnormal   Collection Time    07/08/13 11:35 AM      Result Value Range   Yeast Wet Prep HPF POC NONE SEEN  NONE SEEN   Trich, Wet Prep NONE SEEN  NONE SEEN   Clue Cells Wet Prep HPF  POC NONE SEEN  NONE SEEN   WBC, Wet Prep HPF POC FEW (*) NONE SEEN    Patient Vitals for the past 24 hrs:  BP Temp Pulse Resp SpO2 Height Weight  07/08/13 1401 134/82 mmHg - 76 16 - - -  07/08/13 1241 114/78 mmHg - - 16 - - -  07/08/13 1153 127/86 mmHg - 89 - - - -  07/08/13 1116 120/85 mmHg - 95 - - - -  07/08/13 1107 124/92 mmHg 98.7 F (37.1 C) 93 16 100 % 5' 6.5" (1.689 m) 192 lb (87.091 kg)  07/08/13 1102 - - - - - 5' 6"  (1.676 m) -    MDM Dr. Jodi Mourning called lab orders to MAU.  Patient is negative for ROM PIH work-up is essentially normal. Patient's BPs in MAU have all been acceptable.  Reactive NST and BPP 8/8 RN reports that she has attempted to contact Dr. Delsa Sale numerous times on different contact numbers without any answer or return call Patient has scheduled follow-up for routine prenatal care with Dr. Delsa Sale on Thursday.  Will discharge patient to home. Review labor precautions and s/s of pre-eclampsia prior to discharge  A: Vaginal discharge Headache  P: Discharge home Pre-eclampsia warning signs reviewed. Labor precautions reviewed.  Patient advised to take tylenol PRN for headache Patient instructed to keep scheduled follow-up with Dr. Delsa Sale as scheduled or call for earlier appointment if symptoms  worsen Patient may return to MAU as needed or if her condition were to change or worsen  Farris Has, PA-C 07/08/2013 11:36 AM

## 2013-07-08 NOTE — Progress Notes (Signed)
Pulse- 75

## 2013-07-08 NOTE — Discharge Instructions (Signed)
Hypertension During Pregnancy Hypertension is also called high blood pressure. Blood pressure moves blood in your body. Sometimes, the force that moves the blood becomes too strong. When you are pregnant, this condition should be watched carefully. It can cause problems for you and your baby. HOME CARE   Make and keep all of your doctor visits.  Take medicine as told by your doctor. Tell your doctor about all medicines you take.  Eat very little salt.  Exercise regularly.  Do not drink alcohol.  Do not smoke.  Do not have drinks with caffeine.  Lie on your left side when resting. GET HELP RIGHT AWAY IF:  You have bad belly (abdominal) pain.  You have sudden puffiness (swelling) in the hands, ankles, or face.  You gain 4 pounds (1.8 kilograms) or more in 1 week.  You throw up (vomit) repeatedly.  You have bleeding from the vagina.  You do not feel the baby moving as much.  You have a headache.  You have blurred or double vision.  You have muscle twitching or spasms.  You have shortness of breath.  You have blue fingernails and lips.  You have blood in your pee (urine). MAKE SURE YOU:  Understand these instructions.  Will watch your condition.  Will get help right away if you are not doing well. Document Released: 01/07/2011 Document Revised: 02/27/2012 Document Reviewed: 07/22/2011 [facility] Patient Information 2014 [place].

## 2013-07-08 NOTE — MAU Note (Signed)
Pt states was seen by Dr. Jodi Mourning this am, sent here for possible rupture of membranes. Also bp was elevated in office 130/96.

## 2013-07-11 ENCOUNTER — Ambulatory Visit (INDEPENDENT_AMBULATORY_CARE_PROVIDER_SITE_OTHER): Payer: Medicaid Other | Admitting: Obstetrics & Gynecology

## 2013-07-11 VITALS — BP 145/93 | Temp 98.3°F | Wt 196.0 lb

## 2013-07-11 DIAGNOSIS — O139 Gestational [pregnancy-induced] hypertension without significant proteinuria, unspecified trimester: Secondary | ICD-10-CM

## 2013-07-11 DIAGNOSIS — Z3400 Encounter for supervision of normal first pregnancy, unspecified trimester: Secondary | ICD-10-CM

## 2013-07-11 DIAGNOSIS — O133 Gestational [pregnancy-induced] hypertension without significant proteinuria, third trimester: Secondary | ICD-10-CM

## 2013-07-11 DIAGNOSIS — Z3403 Encounter for supervision of normal first pregnancy, third trimester: Secondary | ICD-10-CM

## 2013-07-11 LAB — POCT URINALYSIS DIPSTICK
Bilirubin, UA: NEGATIVE
Blood, UA: NEGATIVE
Glucose, UA: NEGATIVE
Ketones, UA: NEGATIVE
Leukocytes, UA: NEGATIVE
Nitrite, UA: NEGATIVE
Spec Grav, UA: <=1.005
Urobilinogen, UA: NEGATIVE
pH, UA: 6

## 2013-07-11 NOTE — Progress Notes (Signed)
Pulse- 83 Pt states she is having pain in her lower abdomen.

## 2013-07-12 ENCOUNTER — Other Ambulatory Visit: Payer: Self-pay | Admitting: Obstetrics & Gynecology

## 2013-07-12 ENCOUNTER — Ambulatory Visit (HOSPITAL_COMMUNITY)
Admission: RE | Admit: 2013-07-12 | Discharge: 2013-07-12 | Disposition: A | Discharge status: Home / Self Care | Payer: Medicaid Other | Source: Ambulatory Visit | Attending: Obstetrics & Gynecology | Admitting: Obstetrics & Gynecology

## 2013-07-12 DIAGNOSIS — I10 Essential (primary) hypertension: Secondary | ICD-10-CM

## 2013-07-12 DIAGNOSIS — O24419 Gestational diabetes mellitus in pregnancy, unspecified control: Secondary | ICD-10-CM

## 2013-07-12 DIAGNOSIS — O139 Gestational [pregnancy-induced] hypertension without significant proteinuria, unspecified trimester: Secondary | ICD-10-CM | POA: Insufficient documentation

## 2013-07-12 DIAGNOSIS — Z3689 Encounter for other specified antenatal screening: Secondary | ICD-10-CM | POA: Insufficient documentation

## 2013-07-14 ENCOUNTER — Encounter: Payer: Self-pay | Admitting: Obstetrics & Gynecology

## 2013-07-14 DIAGNOSIS — O133 Gestational [pregnancy-induced] hypertension without significant proteinuria, third trimester: Secondary | ICD-10-CM | POA: Insufficient documentation

## 2013-07-14 NOTE — Patient Instructions (Signed)

## 2013-07-14 NOTE — Progress Notes (Signed)
Likely gestational hypertension.  Will schedule IOL.

## 2013-07-15 ENCOUNTER — Encounter (HOSPITAL_COMMUNITY): Payer: Self-pay | Admitting: *Deleted

## 2013-07-15 ENCOUNTER — Inpatient Hospital Stay (HOSPITAL_COMMUNITY)
Admission: AD | Admit: 2013-07-15 | Discharge: 2013-07-18 | DRG: 775 | Disposition: A | Discharge status: Home / Self Care | Payer: Medicaid Other | Source: Ambulatory Visit | Attending: Obstetrics & Gynecology | Admitting: Obstetrics & Gynecology

## 2013-07-15 DIAGNOSIS — O139 Gestational [pregnancy-induced] hypertension without significant proteinuria, unspecified trimester: Principal | ICD-10-CM | POA: Diagnosis present

## 2013-07-15 DIAGNOSIS — Z932 Ileostomy status: Secondary | ICD-10-CM

## 2013-07-15 DIAGNOSIS — O133 Gestational [pregnancy-induced] hypertension without significant proteinuria, third trimester: Secondary | ICD-10-CM

## 2013-07-15 DIAGNOSIS — Z3403 Encounter for supervision of normal first pregnancy, third trimester: Secondary | ICD-10-CM

## 2013-07-15 LAB — URINALYSIS, ROUTINE W REFLEX MICROSCOPIC
Bilirubin Urine: NEGATIVE
Glucose, UA: NEGATIVE mg/dL
Ketones, ur: NEGATIVE mg/dL
Leukocytes, UA: NEGATIVE
Nitrite: NEGATIVE
Protein, ur: 30 mg/dL — AB
Specific Gravity, Urine: >1.03 — ABNORMAL HIGH (ref 1.005–1.030)
Urobilinogen, UA: 0.2 mg/dL (ref 0.0–1.0)
pH: 6.5 (ref 5.0–8.0)

## 2013-07-15 LAB — COMPREHENSIVE METABOLIC PANEL
ALT: 9 U/L (ref 0–35)
AST: 15 U/L (ref 0–37)
Albumin: 2.6 g/dL — ABNORMAL LOW (ref 3.5–5.2)
Alkaline Phosphatase: 138 U/L — ABNORMAL HIGH (ref 39–117)
BUN: 9 mg/dL (ref 6–23)
CO2: 21 mEq/L (ref 19–32)
Calcium: 9.3 mg/dL (ref 8.4–10.5)
Chloride: 105 mEq/L (ref 96–112)
Creatinine, Ser: 0.47 mg/dL — ABNORMAL LOW (ref 0.50–1.10)
GFR calc Af Amer: >90 mL/min (ref >90)
GFR calc non Af Amer: >90 mL/min (ref >90)
Glucose, Bld: 118 mg/dL — ABNORMAL HIGH (ref 70–99)
Potassium: 3.4 mEq/L — ABNORMAL LOW (ref 3.5–5.1)
Sodium: 136 mEq/L (ref 135–145)
Total Bilirubin: 0.2 mg/dL — ABNORMAL LOW (ref 0.3–1.2)
Total Protein: 6.1 g/dL (ref 6.0–8.3)

## 2013-07-15 LAB — CBC
HCT: 33.1 % — ABNORMAL LOW (ref 36.0–46.0)
HCT: 34.5 % — ABNORMAL LOW (ref 36.0–46.0)
Hemoglobin: 11.2 g/dL — ABNORMAL LOW (ref 12.0–15.0)
Hemoglobin: 11.9 g/dL — ABNORMAL LOW (ref 12.0–15.0)
MCH: 29.9 pg (ref 26.0–34.0)
MCH: 30 pg (ref 26.0–34.0)
MCHC: 33.8 g/dL (ref 30.0–36.0)
MCHC: 34.5 g/dL (ref 30.0–36.0)
MCV: 88.5 fL (ref 78.0–100.0)
MCV: 86.9 fL (ref 78.0–100.0)
Platelets: 199 10*3/uL (ref 150–400)
Platelets: 219 10*3/uL (ref 150–400)
RBC: 3.74 MIL/uL — ABNORMAL LOW (ref 3.87–5.11)
RBC: 3.97 MIL/uL (ref 3.87–5.11)
RDW: 13.4 % (ref 11.5–15.5)
RDW: 13.6 % (ref 11.5–15.5)
WBC: 8.3 10*3/uL (ref 4.0–10.5)
WBC: 8.3 10*3/uL (ref 4.0–10.5)

## 2013-07-15 LAB — TYPE AND SCREEN
ABO/RH(D): AB POS
Antibody Screen: NEGATIVE

## 2013-07-15 LAB — LACTATE DEHYDROGENASE: LDH: 112 U/L (ref 94–250)

## 2013-07-15 LAB — URINE MICROSCOPIC-ADD ON

## 2013-07-15 LAB — RPR: RPR Ser Ql: NONREACTIVE

## 2013-07-15 LAB — URIC ACID: Uric Acid, Serum: 4.2 mg/dL (ref 2.4–7.0)

## 2013-07-15 MED ORDER — LIDOCAINE HCL (PF) 1 % IJ SOLN
30.0000 mL | INTRAMUSCULAR | Status: DC | PRN
  Filled 2013-07-15: qty 30

## 2013-07-15 MED ORDER — TERBUTALINE SULFATE 1 MG/ML IJ SOLN: 0.2500 mg | Freq: Once | INTRAMUSCULAR | Status: AC | PRN

## 2013-07-15 MED ORDER — OXYTOCIN 40 UNITS IN LACTATED RINGERS INFUSION - SIMPLE MED
62.5000 mL/h | INTRAVENOUS | Status: DC
  Administered 2013-07-16: 999 mL/h via INTRAVENOUS
  Filled 2013-07-15: qty 1000

## 2013-07-15 MED ORDER — ACETAMINOPHEN 325 MG PO TABS: 650.0000 mg | ORAL_TABLET | ORAL | Status: DC | PRN

## 2013-07-15 MED ORDER — CITRIC ACID-SODIUM CITRATE 334-500 MG/5ML PO SOLN: 30.0000 mL | ORAL | Status: DC | PRN

## 2013-07-15 MED ORDER — FLEET ENEMA 7-19 GM/118ML RE ENEM: 1.0000 | ENEMA | RECTAL | Status: DC | PRN

## 2013-07-15 MED ORDER — OXYTOCIN BOLUS FROM INFUSION: 500.0000 mL | INTRAVENOUS | Status: DC

## 2013-07-15 MED ORDER — LACTATED RINGERS IV SOLN
INTRAVENOUS | Status: DC
  Administered 2013-07-15 – 2013-07-16 (×3): via INTRAVENOUS

## 2013-07-15 MED ORDER — IBUPROFEN 600 MG PO TABS
600.0000 mg | ORAL_TABLET | Freq: Four times a day (QID) | ORAL | Status: DC | PRN
  Administered 2013-07-16: 600 mg via ORAL
  Filled 2013-07-15: qty 1

## 2013-07-15 MED ORDER — LACTATED RINGERS IV SOLN: 500.0000 mL | INTRAVENOUS | Status: DC | PRN

## 2013-07-15 MED ORDER — MISOPROSTOL 25 MCG QUARTER TABLET
25.0000 ug | ORAL_TABLET | ORAL | Status: DC | PRN
  Administered 2013-07-15 – 2013-07-16 (×4): 25 ug via VAGINAL
  Filled 2013-07-15 (×4): qty 0.25

## 2013-07-15 MED ORDER — ONDANSETRON HCL 4 MG/2ML IJ SOLN
4.0000 mg | Freq: Four times a day (QID) | INTRAMUSCULAR | Status: DC | PRN
  Administered 2013-07-16: 4 mg via INTRAVENOUS
  Filled 2013-07-15: qty 2

## 2013-07-15 MED ORDER — OXYCODONE-ACETAMINOPHEN 5-325 MG PO TABS: 1.0000 | ORAL_TABLET | ORAL | Status: DC | PRN

## 2013-07-15 MED ORDER — BUTORPHANOL TARTRATE 1 MG/ML IJ SOLN
2.0000 mg | INTRAMUSCULAR | Status: DC | PRN
  Administered 2013-07-15 – 2013-07-16 (×2): 2 mg via INTRAVENOUS
  Filled 2013-07-15 (×4): qty 1

## 2013-07-15 NOTE — MAU Note (Signed)
Pt G1 at 38.3wks with gestational Hypertension,  This am BP 150/99.  Denies headache, blurred vision or swelling.

## 2013-07-15 NOTE — Progress Notes (Signed)
Called Dr. Delsa Sale to report cervical exam, uc pattern and pt wanting iv pain meds.  Orders received for iv pain medicine.

## 2013-07-15 NOTE — Progress Notes (Signed)
Not tracing well, pt in br voiding, tracing maternal

## 2013-07-15 NOTE — MAU Provider Note (Signed)
History     CSN: [id number]  Arrival date and time: 07/15/13 6378   First Provider Initiated Contact with Patient 07/15/13 0800      Chief Complaint  Patient presents with  . Hypertension   HPI Ms. Jessica [name] is a 17 y.o. G1P0 at 73w3dwho presents to MAU today with HTN. The patient states BP at home was150/99 this morning. Patient states that she called Dr. JDelsa Saleand was told to come in for evaluation and possible IOL. She denies headache, blurred vision, RUQ pain. She does have occasional LE edema that improves with rest and elevation. She endorses occasional contractions. She denies vaginal bleeding, discharge or LOF. She reports good fetal movement.   OB History   Grav Para Term Preterm Abortions TAB SAB Ect Mult Living   1               Past Medical History  Diagnosis Date  . Crohn's disease     Past Surgical History  Procedure Laterality Date  . Small intestine surgery      Family History  Problem Relation Age of Onset  . Diabetes Father   . Stroke Neg Hx   . Diabetes Maternal Grandmother   . Heart disease Maternal Grandmother   . Hypertension Maternal Grandmother   . Kidney disease Maternal Grandmother   . Cancer Maternal Grandfather     lung  . Hyperlipidemia Maternal Grandfather     History  Substance Use Topics  . Smoking status: Never Smoker   . Smokeless tobacco: Never Used  . Alcohol Use: No    Allergies: No Known Allergies  Prescriptions prior to admission  Medication Sig Dispense Refill  . acetaminophen (TYLENOL) 500 MG tablet Take 1,000 mg by mouth every 6 (six) hours as needed for pain.      .Marland Kitchenondansetron (ZOFRAN) 4 MG tablet Take 1 tablet (4 mg total) by mouth every 6 (six) hours.  12 tablet  0  . Prenatal Vit-Fe Fumarate-FA (PRENATAL MULTIVITAMIN) TABS Take 1 tablet by mouth daily at 12 noon.        Review of Systems  Gastrointestinal: Positive for abdominal pain.  Genitourinary: Negative for dysuria, urgency and  frequency.       Neg - vaginal bleeding, discharge, LOF   Physical Exam   Blood pressure 125/90.  Physical Exam  Constitutional: She is oriented to person, place, and time. She appears well-developed and well-nourished. No distress.  HENT:  Head: Normocephalic and atraumatic.  Cardiovascular: Normal rate, regular rhythm and normal heart sounds.   Respiratory: Effort normal and breath sounds normal. No respiratory distress.  GI: Soft. Bowel sounds are normal. She exhibits no distension and no mass. There is no tenderness. There is no rebound and no guarding.  Musculoskeletal: She exhibits no edema.  Neurological: She is alert and oriented to person, place, and time. She has normal reflexes.  No clonus  Skin: Skin is warm and dry. No erythema.  Psychiatric: She has a normal mood and affect.   Results for orders placed during the hospital encounter of 07/15/13 (from the past 24 hour(s))  URINALYSIS, ROUTINE W REFLEX MICROSCOPIC     Status: Abnormal   Collection Time    07/15/13  6:50 AM      Result Value Range   Color, Urine YELLOW  YELLOW   APPearance HAZY (*) CLEAR   Specific Gravity, Urine >1.030 (*) 1.005 - 1.030   pH 6.5  5.0 - 8.0   Glucose, UA  NEGATIVE  NEGATIVE mg/dL   Hgb urine dipstick TRACE (*) NEGATIVE   Bilirubin Urine NEGATIVE  NEGATIVE   Ketones, ur NEGATIVE  NEGATIVE mg/dL   Protein, ur 30 (*) NEGATIVE mg/dL   Urobilinogen, UA 0.2  0.0 - 1.0 mg/dL   Nitrite NEGATIVE  NEGATIVE   Leukocytes, UA NEGATIVE  NEGATIVE  URINE MICROSCOPIC-ADD ON     Status: Abnormal   Collection Time    07/15/13  6:50 AM      Result Value Range   Squamous Epithelial / LPF MANY (*) RARE   WBC, UA 0-2  <3 WBC/hpf   Bacteria, UA FEW (*) RARE   Urine-Other MUCOUS PRESENT    CBC     Status: Abnormal   Collection Time    07/15/13  7:59 AM      Result Value Range   WBC 8.3  4.0 - 10.5 K/uL   RBC 3.74 (*) 3.87 - 5.11 MIL/uL   Hemoglobin 11.2 (*) 12.0 - 15.0 g/dL   HCT 33.1 (*) 36.0 -  46.0 %   MCV 88.5  78.0 - 100.0 fL   MCH 29.9  26.0 - 34.0 pg   MCHC 33.8  30.0 - 36.0 g/dL   RDW 13.4  11.5 - 15.5 %   Platelets 199  150 - 400 K/uL  COMPREHENSIVE METABOLIC PANEL     Status: Abnormal (Preliminary result)   Collection Time    07/15/13  7:59 AM      Result Value Range   Sodium PENDING  135 - 145 mEq/L   Potassium PENDING  3.5 - 5.1 mEq/L   Chloride PENDING  96 - 112 mEq/L   CO2 21  19 - 32 mEq/L   Glucose, Bld 118 (*) 70 - 99 mg/dL   BUN 9  6 - 23 mg/dL   Creatinine, Ser 0.47 (*) 0.50 - 1.10 mg/dL   Calcium 9.3  8.4 - 10.5 mg/dL   Total Protein 6.1  6.0 - 8.3 g/dL   Albumin 2.6 (*) 3.5 - 5.2 g/dL   AST 15  0 - 37 U/L   ALT 9  0 - 35 U/L   Alkaline Phosphatase 138 (*) 39 - 117 U/L   Total Bilirubin 0.2 (*) 0.3 - 1.2 mg/dL   GFR calc non Af Amer >90  >90 mL/min   GFR calc Af Amer >90  >90 mL/min  URIC ACID     Status: None   Collection Time    07/15/13  7:59 AM      Result Value Range   Uric Acid, Serum 4.2  2.4 - 7.0 mg/dL  LACTATE DEHYDROGENASE     Status: None   Collection Time    07/15/13  7:59 AM      Result Value Range   LDH 112  94 - 250 U/L    MAU Course  Procedures None  MDM Discussed with Dr. Delsa Sale. Admit for IOL.   Assessment and Plan  A: Gestational HTN  P: Admit to L&D for IOL  Farris Has, PA-C  07/15/2013, 9:38 AM

## 2013-07-15 NOTE — Progress Notes (Signed)
Pt eating supper, ask pt to call when done eating so I can give IV pain medicine.  Explain to pt that she is to be on clear liquid diet from here out due to pain.  Pt verb. understanding

## 2013-07-15 NOTE — H&P (Signed)
Jessica [name] is a 17 y.o. female presenting for IOL. Maternal Medical History:  Reason for admission: The patient has had elevated B/Ps over the last several prenatal visits in the 140s/90s range.  Denied any neurological symptoms.  Antenatal testing including a recent growth scan have been reassuring.  Labs have not been consistent with a HELLP picture.  Fetal activity: Perceived fetal activity is normal.    Prenatal complications: See above  Prenatal Complications - Diabetes: none.    OB History   Grav Para Term Preterm Abortions TAB SAB Ect Mult Living   1              Past Medical History  Diagnosis Date  . Crohn's disease    Past Surgical History  Procedure Laterality Date  . Small intestine surgery     Family History: family history includes Cancer in her maternal grandfather; Diabetes in her father and maternal grandmother; Heart disease in her maternal grandmother; Hyperlipidemia in her maternal grandfather; Hypertension in her maternal grandmother; and Kidney disease in her maternal grandmother.  There is no history of Stroke. Social History:  reports that she has never smoked. She has never used smokeless tobacco. She reports that she does not drink alcohol or use illicit drugs.     Review of Systems  Constitutional: Negative for fever.  Eyes: Negative for blurred vision.  Respiratory: Negative for shortness of breath.   Gastrointestinal: Negative for vomiting.  Skin: Negative for rash.  Neurological: Negative for headaches.    Dilation: 1 Effacement (%): Thick Station: -3 Exam by:: Pearletha Forge, RN Blood pressure 143/99, pulse 70, temperature 98.2 F (36.8 C), temperature source Oral, resp. rate 18, height 5' 6.5" (1.689 m), weight 191 lb (86.637 kg). Maternal Exam:  Abdomen: Fetal presentation: vertex Stoma present  Introitus: Normal vulva. Pelvis: adequate for delivery.   Cervix: Cervix evaluated by digital exam.     Fetal Exam Fetal Monitor  Review: Variability: moderate (6-25 bpm).   Pattern: accelerations present and no decelerations.    Fetal State Assessment: Category I - tracings are normal.     Physical Exam  Constitutional: She appears well-developed.  HENT:  Head: Normocephalic.  Neck: Neck supple. No thyromegaly present.  Cardiovascular: Normal rate and regular rhythm.   Respiratory: Breath sounds normal.  GI: Soft. Bowel sounds are normal.  Skin: No rash noted.    Prenatal labs: ABO, Rh: --/--/AB POS, AB POS (07/28 1035) Antibody: NEG (07/28 1035) Rubella: Equivocal (02/05 0000) RPR: NON REACTIVE (07/28 1035)  HBsAg: Negative (02/05 0000)  HIV: NON REACTIVE (05/21 1012)  GBS: NEGATIVE (07/08 1538)   Results for orders placed during the hospital encounter of 07/15/13 (from the past 24 hour(s))  URINALYSIS, ROUTINE W REFLEX MICROSCOPIC     Status: Abnormal   Collection Time    07/15/13  6:50 AM      Result Value Range   Color, Urine YELLOW  YELLOW   APPearance HAZY (*) CLEAR   Specific Gravity, Urine >1.030 (*) 1.005 - 1.030   pH 6.5  5.0 - 8.0   Glucose, UA NEGATIVE  NEGATIVE mg/dL   Hgb urine dipstick TRACE (*) NEGATIVE   Bilirubin Urine NEGATIVE  NEGATIVE   Ketones, ur NEGATIVE  NEGATIVE mg/dL   Protein, ur 30 (*) NEGATIVE mg/dL   Urobilinogen, UA 0.2  0.0 - 1.0 mg/dL   Nitrite NEGATIVE  NEGATIVE   Leukocytes, UA NEGATIVE  NEGATIVE  URINE MICROSCOPIC-ADD ON     Status: Abnormal  Collection Time    07/15/13  6:50 AM      Result Value Range   Squamous Epithelial / LPF MANY (*) RARE   WBC, UA 0-2  <3 WBC/hpf   Bacteria, UA FEW (*) RARE   Urine-Other MUCOUS PRESENT    CBC     Status: Abnormal   Collection Time    07/15/13  7:59 AM      Result Value Range   WBC 8.3  4.0 - 10.5 K/uL   RBC 3.74 (*) 3.87 - 5.11 MIL/uL   Hemoglobin 11.2 (*) 12.0 - 15.0 g/dL   HCT 33.1 (*) 36.0 - 46.0 %   MCV 88.5  78.0 - 100.0 fL   MCH 29.9  26.0 - 34.0 pg   MCHC 33.8  30.0 - 36.0 g/dL   RDW 13.4  11.5 -  15.5 %   Platelets 199  150 - 400 K/uL  COMPREHENSIVE METABOLIC PANEL     Status: Abnormal   Collection Time    07/15/13  7:59 AM      Result Value Range   Sodium 136  135 - 145 mEq/L   Potassium 3.4 (*) 3.5 - 5.1 mEq/L   Chloride 105  96 - 112 mEq/L   CO2 21  19 - 32 mEq/L   Glucose, Bld 118 (*) 70 - 99 mg/dL   BUN 9  6 - 23 mg/dL   Creatinine, Ser 0.47 (*) 0.50 - 1.10 mg/dL   Calcium 9.3  8.4 - 10.5 mg/dL   Total Protein 6.1  6.0 - 8.3 g/dL   Albumin 2.6 (*) 3.5 - 5.2 g/dL   AST 15  0 - 37 U/L   ALT 9  0 - 35 U/L   Alkaline Phosphatase 138 (*) 39 - 117 U/L   Total Bilirubin 0.2 (*) 0.3 - 1.2 mg/dL   GFR calc non Af Amer >90  >90 mL/min   GFR calc Af Amer >90  >90 mL/min  URIC ACID     Status: None   Collection Time    07/15/13  7:59 AM      Result Value Range   Uric Acid, Serum 4.2  2.4 - 7.0 mg/dL  LACTATE DEHYDROGENASE     Status: None   Collection Time    07/15/13  7:59 AM      Result Value Range   LDH 112  94 - 250 U/L  CBC     Status: Abnormal   Collection Time    07/15/13 10:35 AM      Result Value Range   WBC 8.3  4.0 - 10.5 K/uL   RBC 3.97  3.87 - 5.11 MIL/uL   Hemoglobin 11.9 (*) 12.0 - 15.0 g/dL   HCT 34.5 (*) 36.0 - 46.0 %   MCV 86.9  78.0 - 100.0 fL   MCH 30.0  26.0 - 34.0 pg   MCHC 34.5  30.0 - 36.0 g/dL   RDW 13.6  11.5 - 15.5 %   Platelets 219  150 - 400 K/uL  RPR     Status: None   Collection Time    07/15/13 10:35 AM      Result Value Range   RPR NON REACTIVE  NON REACTIVE  TYPE AND SCREEN     Status: None   Collection Time    07/15/13 10:35 AM      Result Value Range   ABO/RH(D) AB POS     Antibody Screen NEG     Sample  Expiration 07/18/2013    ABO/RH     Status: None   Collection Time    07/15/13 10:35 AM      Result Value Range   ABO/RH(D) AB POS      Assessment/Plan: [place] @ [redacted]w[redacted]d  Gestational hypertension.  Unfavorable Bishop's score.  H/O Crohn's disease with an ileostomy Category I FHT  Admit Two-stage  IOL   JACKSON-MOORE,Denario Bagot A 07/15/2013, 8:26 PM

## 2013-07-16 ENCOUNTER — Inpatient Hospital Stay (HOSPITAL_COMMUNITY): Payer: Medicaid Other | Admitting: Anesthesiology

## 2013-07-16 ENCOUNTER — Encounter (HOSPITAL_COMMUNITY): Payer: Self-pay | Admitting: *Deleted

## 2013-07-16 ENCOUNTER — Encounter (HOSPITAL_COMMUNITY): Payer: Self-pay | Admitting: Anesthesiology

## 2013-07-16 LAB — CBC
HCT: 33.9 % — ABNORMAL LOW (ref 36.0–46.0)
HCT: 33.6 % — ABNORMAL LOW (ref 36.0–46.0)
Hemoglobin: 11.9 g/dL — ABNORMAL LOW (ref 12.0–15.0)
Hemoglobin: 11.6 g/dL — ABNORMAL LOW (ref 12.0–15.0)
MCH: 30.1 pg (ref 26.0–34.0)
MCH: 30.1 pg (ref 26.0–34.0)
MCHC: 35.1 g/dL (ref 30.0–36.0)
MCHC: 34.5 g/dL (ref 30.0–36.0)
MCV: 85.8 fL (ref 78.0–100.0)
MCV: 87 fL (ref 78.0–100.0)
Platelets: 297 10*3/uL (ref 150–400)
Platelets: 190 10*3/uL (ref 150–400)
RBC: 3.95 MIL/uL (ref 3.87–5.11)
RBC: 3.86 MIL/uL — ABNORMAL LOW (ref 3.87–5.11)
RDW: 13.9 % (ref 11.5–15.5)
RDW: 13.6 % (ref 11.5–15.5)
WBC: 11.8 10*3/uL — ABNORMAL HIGH (ref 4.0–10.5)
WBC: 13.5 10*3/uL — ABNORMAL HIGH (ref 4.0–10.5)

## 2013-07-16 LAB — ABO/RH: ABO/RH(D): AB POS

## 2013-07-16 MED ORDER — SENNOSIDES-DOCUSATE SODIUM 8.6-50 MG PO TABS: 2.0000 | ORAL_TABLET | Freq: Every day | ORAL | Status: DC

## 2013-07-16 MED ORDER — WITCH HAZEL-GLYCERIN EX PADS: 1.0000 "application " | MEDICATED_PAD | CUTANEOUS | Status: DC | PRN

## 2013-07-16 MED ORDER — OXYCODONE-ACETAMINOPHEN 5-325 MG PO TABS
1.0000 | ORAL_TABLET | ORAL | Status: DC | PRN
  Administered 2013-07-16 – 2013-07-17 (×2): 1 via ORAL
  Filled 2013-07-16 (×2): qty 1

## 2013-07-16 MED ORDER — LACTATED RINGERS IV SOLN
500.0000 mL | Freq: Once | INTRAVENOUS | Status: AC
Start: 2013-07-16 — End: 2013-07-16
  Administered 2013-07-16: 500 mL via INTRAVENOUS

## 2013-07-16 MED ORDER — LIDOCAINE HCL (PF) 1 % IJ SOLN
INTRAMUSCULAR | Status: DC | PRN
  Administered 2013-07-16 (×2): 5 mL

## 2013-07-16 MED ORDER — DIPHENHYDRAMINE HCL 25 MG PO CAPS: 25.0000 mg | ORAL_CAPSULE | Freq: Four times a day (QID) | ORAL | Status: DC | PRN

## 2013-07-16 MED ORDER — LANOLIN HYDROUS EX OINT: TOPICAL_OINTMENT | CUTANEOUS | Status: DC | PRN

## 2013-07-16 MED ORDER — SIMETHICONE 80 MG PO CHEW: 80.0000 mg | CHEWABLE_TABLET | ORAL | Status: DC | PRN

## 2013-07-16 MED ORDER — TETANUS-DIPHTH-ACELL PERTUSSIS 5-2.5-18.5 LF-MCG/0.5 IM SUSP: 0.5000 mL | Freq: Once | INTRAMUSCULAR | Status: DC

## 2013-07-16 MED ORDER — PHENYLEPHRINE 40 MCG/ML (10ML) SYRINGE FOR IV PUSH (FOR BLOOD PRESSURE SUPPORT): 80.0000 ug | PREFILLED_SYRINGE | INTRAVENOUS | Status: DC | PRN

## 2013-07-16 MED ORDER — EPHEDRINE 5 MG/ML INJ
10.0000 mg | INTRAVENOUS | Status: DC | PRN
  Filled 2013-07-16: qty 4

## 2013-07-16 MED ORDER — ZOLPIDEM TARTRATE 5 MG PO TABS: 5.0000 mg | ORAL_TABLET | Freq: Every evening | ORAL | Status: DC | PRN

## 2013-07-16 MED ORDER — BENZOCAINE-MENTHOL 20-0.5 % EX AERO
1.0000 "application " | INHALATION_SPRAY | CUTANEOUS | Status: DC | PRN
  Administered 2013-07-16: 1 via TOPICAL
  Filled 2013-07-16: qty 56

## 2013-07-16 MED ORDER — DIPHENHYDRAMINE HCL 50 MG/ML IJ SOLN: 12.5000 mg | INTRAMUSCULAR | Status: DC | PRN

## 2013-07-16 MED ORDER — OXYTOCIN 40 UNITS IN LACTATED RINGERS INFUSION - SIMPLE MED: 62.5000 mL/h | INTRAVENOUS | Status: DC | PRN

## 2013-07-16 MED ORDER — OXYTOCIN 40 UNITS IN LACTATED RINGERS INFUSION - SIMPLE MED
1.0000 m[IU]/min | INTRAVENOUS | Status: DC
  Administered 2013-07-16: 1 m[IU]/min via INTRAVENOUS

## 2013-07-16 MED ORDER — FENTANYL 2.5 MCG/ML BUPIVACAINE 1/10 % EPIDURAL INFUSION (WH - ANES)
14.0000 mL/h | INTRAMUSCULAR | Status: DC | PRN
  Administered 2013-07-16 (×2): 14 mL/h via EPIDURAL
  Filled 2013-07-16 (×2): qty 125

## 2013-07-16 MED ORDER — PRENATAL MULTIVITAMIN CH
1.0000 | ORAL_TABLET | Freq: Every day | ORAL | Status: DC
  Administered 2013-07-17: 1 via ORAL
  Filled 2013-07-16 (×2): qty 1

## 2013-07-16 MED ORDER — IBUPROFEN 600 MG PO TABS
600.0000 mg | ORAL_TABLET | Freq: Four times a day (QID) | ORAL | Status: DC
  Administered 2013-07-16 – 2013-07-18 (×7): 600 mg via ORAL
  Filled 2013-07-16 (×7): qty 1

## 2013-07-16 MED ORDER — ONDANSETRON HCL 4 MG/2ML IJ SOLN: 4.0000 mg | INTRAMUSCULAR | Status: DC | PRN

## 2013-07-16 MED ORDER — EPHEDRINE 5 MG/ML INJ: 10.0000 mg | INTRAVENOUS | Status: DC | PRN

## 2013-07-16 MED ORDER — ONDANSETRON HCL 4 MG PO TABS: 4.0000 mg | ORAL_TABLET | ORAL | Status: DC | PRN

## 2013-07-16 MED ORDER — DIBUCAINE 1 % RE OINT: 1.0000 "application " | TOPICAL_OINTMENT | RECTAL | Status: DC | PRN

## 2013-07-16 MED ORDER — TERBUTALINE SULFATE 1 MG/ML IJ SOLN: 0.2500 mg | Freq: Once | INTRAMUSCULAR | Status: DC | PRN

## 2013-07-16 MED ORDER — PHENYLEPHRINE 40 MCG/ML (10ML) SYRINGE FOR IV PUSH (FOR BLOOD PRESSURE SUPPORT)
80.0000 ug | PREFILLED_SYRINGE | INTRAVENOUS | Status: DC | PRN
  Filled 2013-07-16: qty 5

## 2013-07-16 MED ORDER — MEDROXYPROGESTERONE ACETATE 150 MG/ML IM SUSP: 150.0000 mg | INTRAMUSCULAR | Status: DC | PRN

## 2013-07-16 NOTE — Progress Notes (Signed)
Jessica [name] is a 17 y.o. G1P0 at 28w4dby LMP admitted for induction of labor due to Hypertension.  Subjective:   Objective: BP 136/94  Pulse 72  Temp(Src) 98.4 F (36.9 C) (Oral)  Resp 16  Ht 5' 6.5" (1.689 m)  Wt 191 lb (86.637 kg)  BMI 30.37 kg/m2  SpO2 99%      FHT:  FHR: 150 bpm, variability: moderate,  accelerations:  Present,  decelerations:  Absent UC:   irregular, every 3-6 minutes SVE:   Dilation: 3 Effacement (%): 90 Station: -2 Exam by:: SCarter KittenRN  Labs: Lab Results  Component Value Date   WBC 11.8* 07/16/2013   HGB 11.9* 07/16/2013   HCT 33.9* 07/16/2013   MCV 85.8 07/16/2013   PLT 297 07/16/2013    Assessment / Plan: 38 weeks.  HTN.  2 stage IOL.  Good response to Cytotec cervical ripening.  Start low dose pitocin.  Labor: Latent phase Preeclampsia:  labs stable Fetal Wellbeing:  Category I Pain Control:  Epidural I/D:  n/a Anticipated MOD:  NSVD  Ghassan Coggeshall A 07/16/2013, 9:49 AM

## 2013-07-16 NOTE — Anesthesia Procedure Notes (Signed)
Epidural Patient location during procedure: OB Start time: 07/16/2013 5:54 AM  Staffing Anesthesiologist: Royce Macadamia., Mickle Asper. Performed by: anesthesiologist   Preanesthetic Checklist Completed: patient identified, site marked, surgical consent, pre-op evaluation, timeout performed, IV checked, risks and benefits discussed and monitors and equipment checked  Epidural Patient position: sitting Prep: site prepped and draped and DuraPrep Patient monitoring: continuous pulse ox and blood pressure Approach: midline Injection technique: LOR air and LOR saline  Needle:  Needle type: Tuohy  Needle gauge: 17 G Needle length: 9 cm and 9 Needle insertion depth: 6 cm Catheter type: closed end flexible Catheter size: 19 Gauge Catheter at skin depth: 12 cm Test dose: negative  Assessment Events: blood not aspirated, injection not painful, no injection resistance, negative IV test and no paresthesia  Additional Notes Patient identified.  Risk benefits discussed including failed block, incomplete pain control, headache, nerve damage, paralysis, blood pressure changes, nausea, vomiting, reactions to medication both toxic or allergic, and postpartum back pain.  Patient expressed understanding and wished to proceed.  All questions were answered.  Sterile technique used throughout procedure and epidural site dressed with sterile barrier dressing. No paresthesia or other complications noted.The patient did not experience any signs of intravascular injection such as tinnitus or metallic taste in mouth nor signs of intrathecal spread such as rapid motor block. Please see nursing notes for vital signs.

## 2013-07-16 NOTE — Progress Notes (Signed)
Jessica [name] is a 17 y.o. G1P0 at 82w4dby LMP admitted for induction of labor due to Hypertension.  Subjective:   Objective: BP 146/87  Pulse 65  Temp(Src) 99 F (37.2 C) (Oral)  Resp 16  Ht 5' 6.5" (1.689 m)  Wt 191 lb (86.637 kg)  BMI 30.37 kg/m2  SpO2 99%   Total I/O In: -  Out: 300 [Urine:300]  FHT:  FHR: 150 bpm, variability: moderate,  accelerations:  Present,  decelerations:  Absent UC:   regular, every 3 minutes SVE:   Dilation: 10 Effacement (%): 100 Station: +2 Exam by:: SCarter KittenRN  Labs: Lab Results  Component Value Date   WBC 11.8* 07/16/2013   HGB 11.9* 07/16/2013   HCT 33.9* 07/16/2013   MCV 85.8 07/16/2013   PLT 297 07/16/2013    Assessment / Plan: Induction of labor due to gestational hypertension,  progressing well on pitocin  Labor: Progressing normally Preeclampsia:  labs stable Fetal Wellbeing:  Category I Pain Control:  Epidural I/D:  n/a Anticipated MOD:  NSVD  Blayn Whetsell A 07/16/2013, 1:44 PM

## 2013-07-16 NOTE — Anesthesia Preprocedure Evaluation (Signed)
Anesthesia Evaluation  Patient identified by MRN, date of birth, ID band Patient awake    Reviewed: Allergy & Precautions, H&P , Patient's Chart, lab work & pertinent test results  Airway Mallampati: II TM Distance: >3 FB Neck ROM: full    Dental no notable dental hx.    Pulmonary neg pulmonary ROS,  breath sounds clear to auscultation  Pulmonary exam normal       Cardiovascular hypertension, negative cardio ROS  Rhythm:regular Rate:Normal     Neuro/Psych negative neurological ROS  negative psych ROS   GI/Hepatic negative GI ROS, Neg liver ROS,   Endo/Other  negative endocrine ROS  Renal/GU negative Renal ROS     Musculoskeletal   Abdominal   Peds  Hematology negative hematology ROS (+)   Anesthesia Other Findings crohns  Reproductive/Obstetrics (+) Pregnancy                           Anesthesia Physical Anesthesia Plan  ASA: III  Anesthesia Plan: Epidural   Post-op Pain Management:    Induction:   Airway Management Planned:   Additional Equipment:   Intra-op Plan:   Post-operative Plan:   Informed Consent: I have reviewed the patients History and Physical, chart, labs and discussed the procedure including the risks, benefits and alternatives for the proposed anesthesia with the patient or authorized representative who has indicated his/her understanding and acceptance.     Plan Discussed with:   Anesthesia Plan Comments:         Anesthesia Quick Evaluation

## 2013-07-17 ENCOUNTER — Inpatient Hospital Stay (HOSPITAL_COMMUNITY): Admission: RE | Admit: 2013-07-17 | Payer: Medicaid Other | Source: Ambulatory Visit

## 2013-07-17 LAB — CBC
HCT: 29.1 % — ABNORMAL LOW (ref 36.0–46.0)
Hemoglobin: 9.9 g/dL — ABNORMAL LOW (ref 12.0–15.0)
MCH: 29.8 pg (ref 26.0–34.0)
MCHC: 34 g/dL (ref 30.0–36.0)
MCV: 87.7 fL (ref 78.0–100.0)
Platelets: 158 10*3/uL (ref 150–400)
RBC: 3.32 MIL/uL — ABNORMAL LOW (ref 3.87–5.11)
RDW: 13.7 % (ref 11.5–15.5)
WBC: 9 10*3/uL (ref 4.0–10.5)

## 2013-07-17 NOTE — Anesthesia Postprocedure Evaluation (Signed)
  Anesthesia Post Note  Patient: Jessica [name]  Procedure(s) Performed: * No procedures listed *  Anesthesia type: Epidural  Patient location: Mother/Baby  Post pain: Pain level controlled  Post assessment: Post-op Vital signs reviewed  Last Vitals:  Filed Vitals:   07/16/13 1945  BP: 143/91  Pulse: 79  Temp: 36.4 C  Resp: 18    Post vital signs: Reviewed  Level of consciousness: awake  Complications: No apparent anesthesia complications

## 2013-07-17 NOTE — Anesthesia Postprocedure Evaluation (Signed)
  Anesthesia Post-op Note  Anesthesia Post Note  Patient: Jessica [name]  Procedure(s) Performed: * No procedures listed *  Anesthesia type: Epidural  Patient location: Mother/Baby  Post pain: Pain level controlled  Post assessment: Post-op Vital signs reviewed  Last Vitals:  Filed Vitals:   07/17/13 0528  BP: 118/80  Pulse: 75  Temp: 36.3 C  Resp: 18    Post vital signs: Reviewed  Level of consciousness:alert  Complications: No apparent anesthesia complications

## 2013-07-17 NOTE — Progress Notes (Signed)
Post Partum Day 1 Subjective: no complaints  Objective: Blood pressure 118/80, pulse 75, temperature 97.4 F (36.3 C), temperature source Oral, resp. rate 18, height 5' 6.5" (1.689 m), weight 191 lb (86.637 kg), SpO2 99.00%, unknown if currently breastfeeding.  Physical Exam:  General: alert and no distress Lochia: appropriate Uterine Fundus: firm Incision: healing well DVT Evaluation: No evidence of DVT seen on physical exam.   Recent Labs  07/16/13 1342 07/17/13 0630  HGB 11.6* 9.9*  HCT 33.6* 29.1*    Assessment/Plan: Plan for discharge tomorrow   LOS: 2 days   Travius Crochet A 07/17/2013, 8:03 AM

## 2013-07-17 NOTE — Progress Notes (Signed)
UR chart review completed.  

## 2013-07-18 MED ORDER — IBUPROFEN 600 MG PO TABS: 600.0000 mg | ORAL_TABLET | Freq: Four times a day (QID) | ORAL | Status: DC | PRN

## 2013-07-18 MED ORDER — OXYCODONE-ACETAMINOPHEN 5-325 MG PO TABS: 1.0000 | ORAL_TABLET | ORAL | Status: DC | PRN

## 2013-07-18 NOTE — Discharge Summary (Signed)
Obstetric Discharge Summary Reason for Admission: induction of labor Prenatal Procedures: ultrasound Intrapartum Procedures: spontaneous vaginal delivery Postpartum Procedures: none Complications-Operative and Postpartum: none Hemoglobin  Date Value Range Status  07/17/2013 9.9* 12.0 - 15.0 g/dL Final  01/23/2013 11.0   Final     HCT  Date Value Range Status  07/17/2013 29.1* 36.0 - 46.0 % Final  01/23/2013 33   Final    Physical Exam:  General: alert and no distress Lochia: appropriate Uterine Fundus: firm Incision: healing well DVT Evaluation: No evidence of DVT seen on physical exam.  Discharge Diagnoses: Term Pregnancy-delivered  Discharge Information: Date: 07/18/2013 Activity: pelvic rest Diet: routine Medications: PNV, Ibuprofen, Colace and Percocet Condition: stable Instructions: refer to practice specific booklet Discharge to: home Follow-up Information   Follow up with Lahoma Crocker A, MD. Schedule an appointment as soon as possible for a visit in 2 weeks.   Contact information:   [address] [place] [address] [phone]       Newborn Data: Live born female  Birth Weight: 6 lb 5.8 oz (2885 g) APGAR: 9, 9  Home with mother.  Kylena Mole A 07/18/2013, 8:22 AM

## 2013-07-18 NOTE — Progress Notes (Signed)
Post Partum Day 2 Subjective: no complaints  Objective: Blood pressure 124/88, pulse 67, temperature 98.2 F (36.8 C), temperature source Oral, resp. rate 16, height 5' 6.5" (1.689 m), weight 191 lb (86.637 kg), SpO2 100.00%, unknown if currently breastfeeding.  Physical Exam:  General: alert and no distress Lochia: appropriate Uterine Fundus: firm Incision: healing well DVT Evaluation: No evidence of DVT seen on physical exam.   Recent Labs  07/16/13 1342 07/17/13 0630  HGB 11.6* 9.9*  HCT 33.6* 29.1*    Assessment/Plan: Discharge home   LOS: 3 days   Shereda Graw A 07/18/2013, 8:17 AM

## 2013-07-18 NOTE — Progress Notes (Signed)
Discussed with pt rubella status and that she was equivocal, pt declined MMR at this time

## 2013-07-26 ENCOUNTER — Encounter: Payer: Self-pay | Admitting: Obstetrics & Gynecology

## 2013-07-26 NOTE — Progress Notes (Signed)
Doing well 

## 2013-08-01 ENCOUNTER — Ambulatory Visit: Payer: Medicaid Other | Admitting: Obstetrics & Gynecology

## 2013-08-08 ENCOUNTER — Encounter: Payer: Self-pay | Admitting: Obstetrics & Gynecology

## 2013-08-22 ENCOUNTER — Ambulatory Visit: Payer: Medicaid Other | Admitting: Obstetrics & Gynecology

## 2013-11-03 ENCOUNTER — Emergency Department (HOSPITAL_COMMUNITY)
Admission: EM | Admit: 2013-11-03 | Discharge: 2013-11-03 | Disposition: A | Discharge status: Home / Self Care | Payer: Medicaid Other | Attending: Emergency Medicine | Admitting: Emergency Medicine

## 2013-11-03 ENCOUNTER — Encounter (HOSPITAL_COMMUNITY): Payer: Self-pay | Admitting: Emergency Medicine

## 2013-11-03 DIAGNOSIS — Z8719 Personal history of other diseases of the digestive system: Secondary | ICD-10-CM | POA: Insufficient documentation

## 2013-11-03 DIAGNOSIS — R040 Epistaxis: Secondary | ICD-10-CM | POA: Insufficient documentation

## 2013-11-03 NOTE — ED Notes (Signed)
Pt reports she started having a nosebleed about 30 minutes ago. Reports that it lasted a couple of minutes and placed pressure on it to stop the bleeding. No hx of nosebleed. No bleeding a triage. Skin warm and dry

## 2013-11-03 NOTE — ED Provider Notes (Signed)
CSN: [id number]     Arrival date & time 11/03/13  1453 History  This chart was scribed for non-physician practitioner, Glendell Docker, NP working with Shaune Pollack, MD by Frederich Balding, ED scribe. This patient was seen in room TR11C/TR11C and the patient's care was started at 3:47 PM.   Chief Complaint  Patient presents with  . Epistaxis   The history is provided by the patient. No language interpreter was used.   HPI Comments: Jessica [name] is a 17 y.o. female who presents to the Emergency Department complaining of epistaxis that started 30 minutes ago. She states she placed pressure on it to stop the bleeding with relief after 10 minutes. Pt states she coughed up blood after she tilt her head back. She denies history of nosebleeds.   Past Medical History  Diagnosis Date  . Crohn's disease    Past Surgical History  Procedure Laterality Date  . Small intestine surgery     Family History  Problem Relation Age of Onset  . Diabetes Father   . Stroke Neg Hx   . Diabetes Maternal Grandmother   . Heart disease Maternal Grandmother   . Hypertension Maternal Grandmother   . Kidney disease Maternal Grandmother   . Cancer Maternal Grandfather     lung  . Hyperlipidemia Maternal Grandfather    History  Substance Use Topics  . Smoking status: Never Smoker   . Smokeless tobacco: Never Used  . Alcohol Use: No   OB History   Grav Para Term Preterm Abortions TAB SAB Ect Mult Living   1 1 1       1      Review of Systems  HENT: Positive for nosebleeds.   All other systems reviewed and are negative.    Allergies  Review of patient's allergies indicates no known allergies.  Home Medications  No current outpatient prescriptions on file.  BP 129/81  Pulse 91  Temp(Src) 98.9 F (37.2 C) (Oral)  Resp 16  Wt 169 lb 6.4 oz (76.839 kg)  SpO2 100%  LMP 10/19/2013  Physical Exam  Nursing note and vitals reviewed. Constitutional: She is oriented to person, place, and time.  She appears well-developed and well-nourished. No distress.  HENT:  Head: Normocephalic and atraumatic.  Right Ear: External ear normal.  Left Ear: External ear normal.  Nose: Mucosal edema present. No epistaxis.  Mouth/Throat: Oropharynx is clear and moist and mucous membranes are normal.  Eyes: EOM are normal.  Neck: Neck supple. No tracheal deviation present.  Cardiovascular: Normal rate.   Pulmonary/Chest: Effort normal. No respiratory distress.  Musculoskeletal: Normal range of motion.  Neurological: She is alert and oriented to person, place, and time.  Skin: Skin is warm and dry.  Psychiatric: She has a normal mood and affect. Her behavior is normal.    ED Course  Procedures (including critical care time)  DIAGNOSTIC STUDIES: Oxygen Saturation is 100% on RA, normal by my interpretation.    COORDINATION OF CARE: 3:49 PM-Discussed treatment plan which includes discharge with pt at bedside and pt agreed to plan.   Labs Review Labs Reviewed - No data to display Imaging Review No results found.  EKG Interpretation   None       MDM   1. Epistaxis    No active bleeding noted a this time:discussed methods to prevent bleeding    I personally performed the services described in this documentation, which was scribed in my presence. The recorded information has been reviewed and  is accurate.   Glendell Docker, NP 11/03/13 1555  Glendell Docker, NP 11/03/13 1556

## 2013-11-05 NOTE — ED Provider Notes (Signed)
History/physical exam/procedure(s) were performed by non-physician practitioner and as supervising physician I was immediately available for consultation/collaboration. I have reviewed all notes and am in agreement with care and plan.   Shaune Pollack, MD 11/05/13 [phone]

## 2014-10-20 ENCOUNTER — Other Ambulatory Visit: Payer: Medicaid Other

## 2014-10-20 ENCOUNTER — Encounter (HOSPITAL_COMMUNITY): Payer: Self-pay | Admitting: Emergency Medicine

## 2014-10-21 ENCOUNTER — Other Ambulatory Visit (INDEPENDENT_AMBULATORY_CARE_PROVIDER_SITE_OTHER): Payer: Medicaid Other | Admitting: *Deleted

## 2014-10-21 VITALS — BP 136/83 | HR 94 | Wt 161.0 lb

## 2014-10-21 DIAGNOSIS — Z3200 Encounter for pregnancy test, result unknown: Secondary | ICD-10-CM

## 2014-10-21 LAB — POCT URINE PREGNANCY: Preg Test, Ur: POSITIVE

## 2014-10-21 MED ORDER — OB COMPLETE PETITE 35-5-1-200 MG PO CAPS: 1.0000 | ORAL_CAPSULE | Freq: Every day | ORAL | Status: DC

## 2014-10-21 NOTE — Progress Notes (Signed)
Pt is in office today for UPT.  Pt states that she has had positive at home test.  Pt states that she does not know her LMP, stating the last cycle she remembers having was in June.  A HCG Quant level is drawn today for possible dating.  Pt advised that she may follow up with office tomorrow for results.  Pt advised that lab results will be needed in order to schedule her NOB appt.  Prenatal vitamins, OB Complete Petite, were sent to pharmacy as well as samples given.

## 2014-10-22 ENCOUNTER — Telehealth: Payer: Self-pay | Admitting: *Deleted

## 2014-10-22 LAB — HCG, QUANTITATIVE, PREGNANCY: hCG, Beta Chain, Quant, S: 52096.3 m[IU]/mL

## 2014-10-22 NOTE — Telephone Encounter (Signed)
Pt placed call to office regarding lab results. Return call to pt. No answer, no voicemail.

## 2014-10-23 ENCOUNTER — Other Ambulatory Visit: Payer: Self-pay | Admitting: *Deleted

## 2014-10-23 DIAGNOSIS — Z3481 Encounter for supervision of other normal pregnancy, first trimester: Secondary | ICD-10-CM

## 2014-10-23 NOTE — Progress Notes (Signed)
Pt has been made aware of lab results and u/s has been ordered.  Pt is to come to office on 10-29-14 for u/s for dating.

## 2014-10-24 ENCOUNTER — Other Ambulatory Visit: Payer: Self-pay | Admitting: Obstetrics & Gynecology

## 2014-10-24 DIAGNOSIS — O3680X1 Pregnancy with inconclusive fetal viability, fetus 1: Secondary | ICD-10-CM

## 2014-10-28 NOTE — Telephone Encounter (Signed)
Pt has u/s appt for 10-29-14.

## 2014-10-29 ENCOUNTER — Other Ambulatory Visit: Payer: Medicaid Other

## 2014-10-29 ENCOUNTER — Encounter: Payer: Self-pay | Admitting: Obstetrics & Gynecology

## 2014-10-29 ENCOUNTER — Encounter (HOSPITAL_COMMUNITY): Payer: Self-pay

## 2014-10-29 ENCOUNTER — Ambulatory Visit (INDEPENDENT_AMBULATORY_CARE_PROVIDER_SITE_OTHER): Payer: Medicaid Other

## 2014-10-29 ENCOUNTER — Ambulatory Visit (INDEPENDENT_AMBULATORY_CARE_PROVIDER_SITE_OTHER): Payer: Medicaid Other | Admitting: Obstetrics & Gynecology

## 2014-10-29 ENCOUNTER — Other Ambulatory Visit: Payer: Self-pay | Admitting: Obstetrics & Gynecology

## 2014-10-29 VITALS — BP 132/83 | HR 84 | Ht 66.0 in | Wt 159.8 lb

## 2014-10-29 DIAGNOSIS — O020 Blighted ovum and nonhydatidiform mole: Secondary | ICD-10-CM

## 2014-10-29 DIAGNOSIS — O3680X1 Pregnancy with inconclusive fetal viability, fetus 1: Secondary | ICD-10-CM

## 2014-10-29 DIAGNOSIS — O021 Missed abortion: Secondary | ICD-10-CM

## 2014-10-29 LAB — US OB TRANSVAGINAL

## 2014-10-29 NOTE — Progress Notes (Signed)
Patient ID: Jessica [name], female   DOB: 10-Dec-1996, 18 y.o.   MRN: [id number]  Chief Complaint  Patient presents with  . Advice Only    HPI Jessica [name] is [name] 18 y.o. female.  U/S c/w an anembryonic pregnancy.  HPI  Past Medical History  Diagnosis Date  . Crohn's disease   . Anemia   . Ileostomy in place     Past Surgical History  Procedure Laterality Date  . Small intestine surgery    . Incision and drainage Left     arm    Family History  Problem Relation Age of Onset  . Diabetes Father   . Stroke Neg Hx   . Diabetes Maternal Grandmother   . Heart disease Maternal Grandmother   . Hypertension Maternal Grandmother   . Kidney disease Maternal Grandmother   . Cancer Maternal Grandfather     lung  . Hyperlipidemia Maternal Grandfather     Social History History  Substance Use Topics  . Smoking status: Never Smoker   . Smokeless tobacco: Never Used  . Alcohol Use: No    No Known Allergies  Current Outpatient Prescriptions  Medication Sig Dispense Refill  . Prenat-FeCbn-FeAspGl-FA-Omega (OB COMPLETE PETITE) 35-5-1-200 MG CAPS Take 1 capsule by mouth daily. 30 capsule 11  . PATADAY 0.2 % SOLN   3   No current facility-administered medications for this visit.    Review of Systems Review of Systems Constitutional: negative for fatigue and weight loss Respiratory: negative for cough and wheezing Cardiovascular: negative for chest pain, fatigue and palpitations Gastrointestinal: negative for abdominal pain and change in bowel habits Genitourinary:positive for cramping Integument/breast: negative for nipple discharge Musculoskeletal:negative for myalgias Neurological: negative for gait problems and tremors Behavioral/Psych: negative for abusive relationship, depression Endocrine: negative for temperature intolerance     Blood pressure 132/83, pulse 84, height 5' 6"  (1.676 m), weight 72.485 kg (159 lb 12.8 oz), last menstrual period 06/25/2014.  Physical  Exam Physical Exam   50% of 15 min visit spent on counseling and coordination of care.   Data Reviewed U/S  Assessment    Early pregnancy failure, ?threatened abortion     Plan    She elects to proceed with [name] suction D&C Meds ordered this encounter  Medications  . PATADAY 0.2 % SOLN    Sig:     Refill:  3    Follow up as needed.         Jessica [name] 10/29/2014, 9:19 PM

## 2014-10-29 NOTE — Patient Instructions (Signed)
Blighted Ovum A blighted ovum (anembryonic pregnancy) happens when a fertilized egg (embryo) attaches itself to the uterine wall, but the embryo does not develop. The pregnancy sac (placenta) continues to grow even though the embryo does not grow and develop.The pregnancy hormone is still secreted because the placenta has formed. This will result in a positive pregnancy test despite having an abnormal pregnancy. A blighted ovum occurs within the first trimester, sometimes before a woman knows she is pregnant.  CAUSES A blighted ovum is usually the result of chromosomal problems. This can be caused by abnormal cell division or poor quality sperm or egg. SYMPTOMS Early on, signs of pregnancy may be experienced, such as:  A missed menstrual period.  Fatigue.  Feeling sick to your stomach (nauseous).  Sore breasts.  A positive pregnancy test. Then, signs of miscarriage may develop, such as:  Abdominal cramps.  Vaginal bleeding or spotting.  A menstrual period that is heavier than usual. DIAGNOSIS The diagnosis of a blighted ovum is made with an ultrasound test that shows an empty uterus or an empty gestational sac. TREATMENT Your caregiver will help you decide what the best treatment is for you. Treatment for a blighted ovum includes:   Letting your body naturally pass the tissue of a blighted ovum.  Taking medicine to trigger the miscarriage.  Having a procedure called a dilation and curettage (D&C) to remove the placental tissues.  A D&C may be helpful if you would like the tissue examined to determine the reason for a miscarriage. Talk to your caregiver about the risks involved with this procedure. HOME CARE INSTRUCTIONS   Follow up with your caregiver to make sure that your pregnancy hormone returns to zero.  Wait at least 1 to 3 regular menstrual cycles before trying to get pregnant again, or as recommended by your caregiver. SEEK IMMEDIATE MEDICAL CARE IF:  You have  worsening abdominal pain.  You have very heavy bleeding or use 1 to 2 pads every hour, for more than 2 hours.  You are dizzy, feel faint, or pass out. Document Released: 03/22/2011 Document Revised: 02/27/2012 Document Reviewed: 03/22/2011 [facility] Patient Information 2015 [place], [place]. This information is not intended to replace advice given to you by your health care provider. Make sure you discuss any questions you have with your health care provider.

## 2014-10-31 ENCOUNTER — Ambulatory Visit (HOSPITAL_COMMUNITY)
Admission: RE | Admit: 2014-10-31 | Discharge: 2014-10-31 | Disposition: A | Discharge status: Home / Self Care | Payer: Medicaid Other | Source: Ambulatory Visit | Attending: Obstetrics & Gynecology | Admitting: Obstetrics & Gynecology

## 2014-10-31 ENCOUNTER — Ambulatory Visit (HOSPITAL_COMMUNITY): Payer: Medicaid Other | Admitting: Anesthesiology

## 2014-10-31 ENCOUNTER — Encounter (HOSPITAL_COMMUNITY)
Admission: RE | Disposition: A | Discharge status: Home / Self Care | Payer: Self-pay | Source: Ambulatory Visit | Attending: Obstetrics & Gynecology

## 2014-10-31 ENCOUNTER — Encounter (HOSPITAL_COMMUNITY): Payer: Self-pay | Admitting: Anesthesiology

## 2014-10-31 DIAGNOSIS — O021 Missed abortion: Secondary | ICD-10-CM | POA: Diagnosis present

## 2014-10-31 DIAGNOSIS — K5090 Crohn's disease, unspecified, without complications: Secondary | ICD-10-CM | POA: Diagnosis not present

## 2014-10-31 HISTORY — DX: Anemia, unspecified: D64.9

## 2014-10-31 HISTORY — PX: DILATION AND EVACUATION: SHX1459

## 2014-10-31 LAB — CBC
HCT: 37.5 % (ref 36.0–46.0)
Hemoglobin: 12.6 g/dL (ref 12.0–15.0)
MCH: 29.4 pg (ref 26.0–34.0)
MCHC: 33.6 g/dL (ref 30.0–36.0)
MCV: 87.6 fL (ref 78.0–100.0)
Platelets: 279 10*3/uL (ref 150–400)
RBC: 4.28 MIL/uL (ref 3.87–5.11)
RDW: 13.1 % (ref 11.5–15.5)
WBC: 8.6 10*3/uL (ref 4.0–10.5)

## 2014-10-31 SURGERY — DILATION AND EVACUATION, UTERUS
Anesthesia: Monitor Anesthesia Care

## 2014-10-31 MED ORDER — SODIUM CHLORIDE 0.9 % IV SOLN: 250.0000 mL | INTRAVENOUS | Status: DC | PRN

## 2014-10-31 MED ORDER — MIDAZOLAM HCL 2 MG/2ML IJ SOLN
INTRAMUSCULAR | Status: AC
  Filled 2014-10-31: qty 2

## 2014-10-31 MED ORDER — OXYCODONE-ACETAMINOPHEN 5-325 MG PO TABS: 2.0000 | ORAL_TABLET | Freq: Four times a day (QID) | ORAL | Status: DC | PRN

## 2014-10-31 MED ORDER — FENTANYL CITRATE 0.05 MG/ML IJ SOLN
INTRAMUSCULAR | Status: DC | PRN
  Administered 2014-10-31 (×2): 50 ug via INTRAVENOUS

## 2014-10-31 MED ORDER — SODIUM CHLORIDE 0.9 % IJ SOLN: 3.0000 mL | INTRAMUSCULAR | Status: DC | PRN

## 2014-10-31 MED ORDER — DOXYCYCLINE HYCLATE 100 MG IV SOLR
200.0000 mg | Freq: Once | INTRAVENOUS | Status: AC
  Administered 2014-10-31: 200 mg via INTRAVENOUS
  Filled 2014-10-31: qty 200

## 2014-10-31 MED ORDER — MEPERIDINE HCL 25 MG/ML IJ SOLN: 6.2500 mg | INTRAMUSCULAR | Status: DC | PRN

## 2014-10-31 MED ORDER — LACTATED RINGERS IV SOLN
INTRAVENOUS | Status: DC
  Administered 2014-10-31 (×2): via INTRAVENOUS

## 2014-10-31 MED ORDER — PROMETHAZINE HCL 25 MG/ML IJ SOLN: 6.2500 mg | INTRAMUSCULAR | Status: DC | PRN

## 2014-10-31 MED ORDER — MIDAZOLAM HCL 2 MG/2ML IJ SOLN: 0.5000 mg | Freq: Once | INTRAMUSCULAR | Status: DC | PRN

## 2014-10-31 MED ORDER — KETOROLAC TROMETHAMINE 30 MG/ML IJ SOLN
INTRAMUSCULAR | Status: AC
  Filled 2014-10-31: qty 1

## 2014-10-31 MED ORDER — LIDOCAINE HCL 1 % IJ SOLN
INTRAMUSCULAR | Status: DC | PRN
  Administered 2014-10-31: 10 mL

## 2014-10-31 MED ORDER — PROPOFOL 10 MG/ML IV EMUL
INTRAVENOUS | Status: DC | PRN
  Administered 2014-10-31: 50 mg via INTRAVENOUS
  Administered 2014-10-31: 20 mg via INTRAVENOUS
  Administered 2014-10-31 (×2): 30 mg via INTRAVENOUS

## 2014-10-31 MED ORDER — PROPOFOL INFUSION 10 MG/ML OPTIME
INTRAVENOUS | Status: DC | PRN
  Administered 2014-10-31: 50 ug/kg/min via INTRAVENOUS

## 2014-10-31 MED ORDER — MIDAZOLAM HCL 2 MG/2ML IJ SOLN
INTRAMUSCULAR | Status: DC | PRN
  Administered 2014-10-31: 2 mg via INTRAVENOUS

## 2014-10-31 MED ORDER — SODIUM CHLORIDE 0.9 % IJ SOLN: 3.0000 mL | Freq: Two times a day (BID) | INTRAMUSCULAR | Status: DC

## 2014-10-31 MED ORDER — DEXAMETHASONE SODIUM PHOSPHATE 10 MG/ML IJ SOLN
INTRAMUSCULAR | Status: DC | PRN
  Administered 2014-10-31: 4 mg via INTRAVENOUS

## 2014-10-31 MED ORDER — NORETHINDRONE ACET-ETHINYL EST 1-20 MG-MCG PO TABS
1.0000 | ORAL_TABLET | Freq: Every day | ORAL | Status: DC
Start: 2014-10-31 — End: 2015-12-18

## 2014-10-31 MED ORDER — PANTOPRAZOLE SODIUM 40 MG PO TBEC
DELAYED_RELEASE_TABLET | ORAL | Status: AC
  Administered 2014-10-31: 40 mg via ORAL
  Filled 2014-10-31: qty 1

## 2014-10-31 MED ORDER — LIDOCAINE HCL (CARDIAC) 20 MG/ML IV SOLN
INTRAVENOUS | Status: AC
  Filled 2014-10-31: qty 5

## 2014-10-31 MED ORDER — ONDANSETRON HCL 4 MG/2ML IJ SOLN
INTRAMUSCULAR | Status: DC | PRN
  Administered 2014-10-31: 4 mg via INTRAVENOUS

## 2014-10-31 MED ORDER — KETOROLAC TROMETHAMINE 30 MG/ML IJ SOLN: 15.0000 mg | Freq: Once | INTRAMUSCULAR | Status: DC | PRN

## 2014-10-31 MED ORDER — FENTANYL CITRATE 0.05 MG/ML IJ SOLN: 25.0000 ug | INTRAMUSCULAR | Status: DC | PRN

## 2014-10-31 MED ORDER — OXYCODONE HCL 5 MG PO TABS: 5.0000 mg | ORAL_TABLET | ORAL | Status: DC | PRN

## 2014-10-31 MED ORDER — SCOPOLAMINE 1 MG/3DAYS TD PT72
1.0000 | MEDICATED_PATCH | Freq: Once | TRANSDERMAL | Status: DC
  Administered 2014-10-31: 1.5 mg via TRANSDERMAL

## 2014-10-31 MED ORDER — ACETAMINOPHEN 325 MG PO TABS: 650.0000 mg | ORAL_TABLET | ORAL | Status: DC | PRN

## 2014-10-31 MED ORDER — PROPOFOL 10 MG/ML IV EMUL
INTRAVENOUS | Status: AC
  Filled 2014-10-31: qty 20

## 2014-10-31 MED ORDER — LIDOCAINE HCL 1 % IJ SOLN
INTRAMUSCULAR | Status: AC
  Filled 2014-10-31: qty 20

## 2014-10-31 MED ORDER — SCOPOLAMINE 1 MG/3DAYS TD PT72
MEDICATED_PATCH | TRANSDERMAL | Status: AC
  Filled 2014-10-31: qty 1

## 2014-10-31 MED ORDER — KETOROLAC TROMETHAMINE 30 MG/ML IJ SOLN
INTRAMUSCULAR | Status: DC | PRN
  Administered 2014-10-31: 30 mg via INTRAVENOUS

## 2014-10-31 MED ORDER — LIDOCAINE HCL (CARDIAC) 20 MG/ML IV SOLN
INTRAVENOUS | Status: DC | PRN
  Administered 2014-10-31: 80 mg via INTRAVENOUS

## 2014-10-31 MED ORDER — ACETAMINOPHEN 650 MG RE SUPP
650.0000 mg | RECTAL | Status: DC | PRN
  Filled 2014-10-31: qty 1

## 2014-10-31 MED ORDER — PANTOPRAZOLE SODIUM 40 MG PO TBEC: 40.0000 mg | DELAYED_RELEASE_TABLET | Freq: Once | ORAL | Status: AC

## 2014-10-31 MED ORDER — FENTANYL CITRATE 0.05 MG/ML IJ SOLN
INTRAMUSCULAR | Status: AC
  Filled 2014-10-31: qty 2

## 2014-10-31 SURGICAL SUPPLY — 17 items
CATH ROBINSON RED A/P 16FR (CATHETERS) ×2 IMPLANT
CLOTH BEACON ORANGE TIMEOUT ST (SAFETY) ×2 IMPLANT
DECANTER SPIKE VIAL GLASS SM (MISCELLANEOUS) ×2 IMPLANT
GLOVE BIO SURGEON STRL SZ 6.5 (GLOVE) ×2 IMPLANT
GOWN STRL REUS W/TWL LRG LVL3 (GOWN DISPOSABLE) ×4 IMPLANT
KIT BERKELEY 1ST TRIMESTER 3/8 (MISCELLANEOUS) ×2 IMPLANT
NS IRRIG 1000ML POUR BTL (IV SOLUTION) ×2 IMPLANT
PACK VAGINAL MINOR WOMEN LF (CUSTOM PROCEDURE TRAY) ×2 IMPLANT
PAD OB MATERNITY 4.3X12.25 (PERSONAL CARE ITEMS) ×2 IMPLANT
PAD PREP 24X48 CUFFED NSTRL (MISCELLANEOUS) ×2 IMPLANT
SCRUB PCMX 4 OZ (MISCELLANEOUS) ×2 IMPLANT
SET BERKELEY SUCTION TUBING (SUCTIONS) ×2 IMPLANT
TOWEL OR 17X24 6PK STRL BLUE (TOWEL DISPOSABLE) ×4 IMPLANT
VACURETTE 10 RIGID CVD (CANNULA) IMPLANT
VACURETTE 7MM CVD STRL WRAP (CANNULA) IMPLANT
VACURETTE 8 RIGID CVD (CANNULA) IMPLANT
VACURETTE 9 RIGID CVD (CANNULA) IMPLANT

## 2014-10-31 NOTE — Discharge Instructions (Signed)
Dilation and Curettage or Vacuum Curettage Dilation and curettage (D&C) and vacuum curettage are minor procedures. A D&C involves stretching (dilation) the cervix and scraping (curettage) the inside lining of the womb (uterus). During a D&C, tissue is gently scraped from the inside lining of the uterus. During a vacuum curettage, the lining and tissue in the uterus are removed with the use of gentle suction.  Curettage may be performed to either diagnose or treat a problem. As a diagnostic procedure, curettage is performed to examine tissues from the uterus. A diagnostic curettage may be performed for the following symptoms:   Irregular bleeding in the uterus.   Bleeding with the development of clots.   Spotting between menstrual periods.   Prolonged menstrual periods.   Bleeding after menopause.   No menstrual period (amenorrhea).   A change in size and shape of the uterus.  As a treatment procedure, curettage may be performed for the following reasons:   Removal of an IUD (intrauterine device).   Removal of retained placenta after giving birth. Retained placenta can cause an infection or bleeding severe enough to require transfusions.   Abortion.   Miscarriage.   Removal of polyps inside the uterus.   Removal of uncommon types of noncancerous lumps (fibroids).  LET [facility] CARE PROVIDER KNOW ABOUT:   Any allergies you have.   All medicines you are taking, including vitamins, herbs, eye drops, creams, and over-the-counter medicines.   Previous problems you or members of your family have had with the use of anesthetics.   Any blood disorders you have.   Previous surgeries you have had.   Medical conditions you have. RISKS AND COMPLICATIONS  Generally, this is a safe procedure. However, as with any procedure, complications can occur. Possible complications include:  Excessive bleeding.   Infection of the uterus.   Damage to the cervix.    Development of scar tissue (adhesions) inside the uterus, later causing abnormal amounts of menstrual bleeding.   Complications from the general anesthetic, if a general anesthetic is used.   Putting a hole (perforation) in the uterus. This is rare.  BEFORE THE PROCEDURE   Eat and drink before the procedure only as directed by your health care provider.   Arrange for someone to take you home.  PROCEDURE  This procedure usually takes about 15-30 minutes.  You will be given one of the following:  A medicine that numbs the area in and around the cervix (local anesthetic).   A medicine to make you sleep through the procedure (general anesthetic).  You will lie on your back with your legs in stirrups.   A warm metal or plastic instrument (speculum) will be placed in your vagina to keep it open and to allow the health care provider to see the cervix.  There are two ways in which your cervix can be softened and dilated. These include:   Taking a medicine.   Having thin rods (laminaria) inserted into your cervix.   A curved tool (curette) will be used to scrape cells from the inside lining of the uterus. In some cases, gentle suction is applied with the curette. The curette will then be removed.  AFTER THE PROCEDURE   You will rest in the recovery area until you are stable and are ready to go home.   You may feel sick to your stomach (nauseous) or throw up (vomit) if you were given a general anesthetic.   You may have a sore throat if a tube  was placed in your throat during general anesthesia.   You may have light cramping and bleeding. This may last for 2 days to 2 weeks after the procedure.   Your uterus needs to make a new lining after the procedure. This may make your next period late. Document Released: 12/05/2005 Document Revised: 08/07/2013 Document Reviewed: 07/04/2013 [facility] Patient Information 2015 [place], [place]. This information is not intended to  replace advice given to you by your health care provider. Make sure you discuss any questions you have with your health care provider. DISCHARGE INSTRUCTIONS: D&C / D&E The following instructions have been prepared to help you care for yourself upon your return home.   Personal hygiene:  Use sanitary pads for vaginal drainage, not tampons.  Shower the day after your procedure.  NO tub baths, pools or Jacuzzis for 2-3 weeks.  Wipe front to back after using the bathroom.  Activity and limitations:  Do NOT drive or operate any equipment for 24 hours. The effects of anesthesia are still present and drowsiness may result.  Do NOT rest in bed all day.  Walking is encouraged.  Walk up and down stairs slowly.  You may resume your normal activity in one to two days or as indicated by your physician.  Sexual activity: NO intercourse for at least 2 weeks after the procedure, or as indicated by your physician.  Diet: Eat a light meal as desired this evening. You may resume your usual diet tomorrow.  Return to work: You may resume your work activities in one to two days or as indicated by your doctor.  What to expect after your surgery: Expect to have vaginal bleeding/discharge for 2-3 days and spotting for up to 10 days. It is not unusual to have soreness for up to 1-2 weeks. You may have a slight burning sensation when you urinate for the first day. Mild cramps may continue for a couple of days. You may have a regular period in 2-6 weeks.  Call your doctor for any of the following:  Excessive vaginal bleeding, saturating and changing one pad every hour.  Inability to urinate 6 hours after discharge from hospital.  Pain not relieved by pain medication.  Fever of 100.4 F or greater.  Unusual vaginal discharge or odor.   Call for an appointment:    Patients signature: ______________________  Nurses signature ________________________  Support person's  signature_______________________

## 2014-10-31 NOTE — Transfer of Care (Signed)
Immediate Anesthesia Transfer of Care Note  Patient: Jessica [name]  Procedure(s) Performed: Procedure(s): DILATATION AND EVACUATION (N/A)  Patient Location: PACU  Anesthesia Type:MAC  Level of Consciousness: awake  Airway & Oxygen Therapy: Patient Spontanous Breathing and Patient connected to nasal cannula oxygen  Post-op Assessment: Report given to PACU RN, Post -op Vital signs reviewed and stable and Patient moving all extremities  Post vital signs: Reviewed and stable  Complications: No apparent anesthesia complications

## 2014-10-31 NOTE — Anesthesia Postprocedure Evaluation (Signed)
  Anesthesia Post-op Note  Anesthesia Post Note  Patient: Jessica [name]  Procedure(s) Performed: Procedure(s) (LRB): DILATATION AND EVACUATION (N/A)  Anesthesia type: MAC  Patient location: PACU  Post pain: Pain level controlled  Post assessment: Post-op Vital signs reviewed  Last Vitals:  Filed Vitals:   10/31/14 1342  BP:   Pulse: 75  Temp: 36.6 C  Resp: 16    Post vital signs: Reviewed  Level of consciousness: sedated  Complications: No apparent anesthesia complications

## 2014-10-31 NOTE — Anesthesia Preprocedure Evaluation (Signed)
Anesthesia Evaluation  Patient identified by MRN, date of birth, ID band Patient awake    Reviewed: Allergy & Precautions, H&P , Patient's Chart, lab work & pertinent test results, reviewed documented beta blocker date and time   History of Anesthesia Complications Negative for: history of anesthetic complications  Airway Mallampati: II  TM Distance: >3 FB Neck ROM: full    Dental   Pulmonary  breath sounds clear to auscultation        Cardiovascular Exercise Tolerance: Good Rhythm:regular Rate:Normal     Neuro/Psych negative psych ROS   GI/Hepatic   Endo/Other    Renal/GU      Musculoskeletal   Abdominal   Peds  Hematology  (+) anemia ,   Anesthesia Other Findings crohns disease  Reproductive/Obstetrics                             Anesthesia Physical Anesthesia Plan  ASA: III  Anesthesia Plan: MAC   Post-op Pain Management:    Induction:   Airway Management Planned:   Additional Equipment:   Intra-op Plan:   Post-operative Plan:   Informed Consent: I have reviewed the patients History and Physical, chart, labs and discussed the procedure including the risks, benefits and alternatives for the proposed anesthesia with the patient or authorized representative who has indicated his/her understanding and acceptance.   Dental Advisory Given  Plan Discussed with: CRNA, Surgeon and Anesthesiologist  Anesthesia Plan Comments:         Anesthesia Quick Evaluation

## 2014-10-31 NOTE — H&P (Signed)
Chief Complaint: 18 y.o. who presents with a missed abortion  Details of Present Illness: An ultrasound this week showed an embryonic demise.  BP 135/89 mmHg  Pulse 103  Temp(Src) 98.1 F (36.7 C) (Oral)  Resp 20  SpO2 100%  LMP 06/25/2014  Past Medical History  Diagnosis Date  . Crohn's disease   . Anemia   . Ileostomy in place    History   Social History  . Marital Status: Single    Spouse Name: N/A    Number of Children: N/A  . Years of Education: N/A   Occupational History  . Not on file.   Social History Main Topics  . Smoking status: Never Smoker   . Smokeless tobacco: Never Used  . Alcohol Use: No  . Drug Use: No  . Sexual Activity: Not Currently   Other Topics Concern  . Not on file   Social History Narrative   Family History  Problem Relation Age of Onset  . Diabetes Father   . Stroke Neg Hx   . Diabetes Maternal Grandmother   . Heart disease Maternal Grandmother   . Hypertension Maternal Grandmother   . Kidney disease Maternal Grandmother   . Cancer Maternal Grandfather     lung  . Hyperlipidemia Maternal Grandfather     Pertinent items are noted in HPI.  Pre-Op Diagnosis: Suction D&C Missed abortion   Planned Procedure: Procedure(s): DILATATION AND EVACUATION  I have reviewed the patient's history and have completed the physical exam and Jessica [name] is acceptable for surgery.  Agnes [name], MD 10/31/2014 12:16 PM

## 2014-10-31 NOTE — Op Note (Signed)
Preoperative diagnosis: Missed abortion  Postoperative diagnosis: Same  Procedure: Suction dilatation and curretage Surgeon: Lahoma Crocker A  Anesthesia: Managed Anesthesia Care/Paracervical Block  Estimated blood loss: 50 ml  Urine output: per Anesthesiology  IV Fluids: per Anesthesiology  Complications: None  Specimen: PATHOLOGY  Operative Findings: Moderate products of conception, retroverted uterus  Description of procedure:   The patient was taken to the operating room and placed on the operating table in the semi-lithotomy position in [place].  Examination under anesthesia was performed.  The patient was prepped and draped in the usual manner.  After a time-out had been completed, a speculum was placed in the vagina.  The anterior lip of the cervix was grasped with a single-toothed tenaculum.  A paracervical block was performed using 10 ml of 1% lidocaine.  The block was performed at 4 and 8 o'clock at the cervical vaginal junction. The cervix was dilated with Kennon Rounds dilators.  A 7 -mm suction curet was inserted in the uterine cavity.  The device was activated and the curet rotated to evacuate the products of conception.   All the instruments were removed from the vagina.  Final instrument counts were correct.  The patient was taken to the PACU in stable condition.

## 2014-11-01 ENCOUNTER — Inpatient Hospital Stay (HOSPITAL_COMMUNITY)
Admission: EM | Admit: 2014-11-01 | Discharge: 2014-11-07 | DRG: 386 | Disposition: A | Discharge status: Home / Self Care | Payer: Medicaid Other | Attending: Internal Medicine | Admitting: Internal Medicine

## 2014-11-01 ENCOUNTER — Encounter (HOSPITAL_COMMUNITY): Payer: Self-pay | Admitting: Cardiology

## 2014-11-01 ENCOUNTER — Emergency Department (HOSPITAL_COMMUNITY): Payer: Medicaid Other

## 2014-11-01 DIAGNOSIS — K50111 Crohn's disease of large intestine with rectal bleeding: Secondary | ICD-10-CM

## 2014-11-01 DIAGNOSIS — D649 Anemia, unspecified: Secondary | ICD-10-CM | POA: Diagnosis present

## 2014-11-01 DIAGNOSIS — K50911 Crohn's disease, unspecified, with rectal bleeding: Secondary | ICD-10-CM

## 2014-11-01 DIAGNOSIS — O021 Missed abortion: Secondary | ICD-10-CM

## 2014-11-01 DIAGNOSIS — K5090 Crohn's disease, unspecified, without complications: Secondary | ICD-10-CM | POA: Insufficient documentation

## 2014-11-01 DIAGNOSIS — K50813 Crohn's disease of both small and large intestine with fistula: Principal | ICD-10-CM | POA: Diagnosis present

## 2014-11-01 DIAGNOSIS — Z932 Ileostomy status: Secondary | ICD-10-CM

## 2014-11-01 DIAGNOSIS — K625 Hemorrhage of anus and rectum: Secondary | ICD-10-CM | POA: Diagnosis present

## 2014-11-01 DIAGNOSIS — K5010 Crohn's disease of large intestine without complications: Secondary | ICD-10-CM | POA: Diagnosis present

## 2014-11-01 DIAGNOSIS — K5900 Constipation, unspecified: Secondary | ICD-10-CM | POA: Diagnosis present

## 2014-11-01 LAB — CBC WITH DIFFERENTIAL/PLATELET
Basophils Absolute: 0 10*3/uL (ref 0.0–0.1)
Basophils Relative: 0 % (ref 0–1)
Eosinophils Absolute: 0.1 10*3/uL (ref 0.0–0.7)
Eosinophils Relative: 1 % (ref 0–5)
HCT: 34.5 % — ABNORMAL LOW (ref 36.0–46.0)
Hemoglobin: 11.3 g/dL — ABNORMAL LOW (ref 12.0–15.0)
Lymphocytes Relative: 29 % (ref 12–46)
Lymphs Abs: 2.9 10*3/uL (ref 0.7–4.0)
MCH: 28.9 pg (ref 26.0–34.0)
MCHC: 32.8 g/dL (ref 30.0–36.0)
MCV: 88.2 fL (ref 78.0–100.0)
Monocytes Absolute: 1 10*3/uL (ref 0.1–1.0)
Monocytes Relative: 10 % (ref 3–12)
Neutro Abs: 6 10*3/uL (ref 1.7–7.7)
Neutrophils Relative %: 60 % (ref 43–77)
Platelets: 300 10*3/uL (ref 150–400)
RBC: 3.91 MIL/uL (ref 3.87–5.11)
RDW: 13.1 % (ref 11.5–15.5)
WBC: 10 10*3/uL (ref 4.0–10.5)

## 2014-11-01 LAB — URINALYSIS, ROUTINE W REFLEX MICROSCOPIC
Bilirubin Urine: NEGATIVE
Glucose, UA: NEGATIVE mg/dL
Ketones, ur: NEGATIVE mg/dL
Nitrite: NEGATIVE
Protein, ur: NEGATIVE mg/dL
Specific Gravity, Urine: 1.013 (ref 1.005–1.030)
Urobilinogen, UA: 0.2 mg/dL (ref 0.0–1.0)
pH: 6 (ref 5.0–8.0)

## 2014-11-01 LAB — POC OCCULT BLOOD, ED: Fecal Occult Bld: POSITIVE — AB

## 2014-11-01 LAB — URINE MICROSCOPIC-ADD ON

## 2014-11-01 LAB — COMPREHENSIVE METABOLIC PANEL
ALT: 15 U/L (ref 0–35)
AST: 14 U/L (ref 0–37)
Albumin: 3.1 g/dL — ABNORMAL LOW (ref 3.5–5.2)
Alkaline Phosphatase: 81 U/L (ref 39–117)
Anion gap: 15 (ref 5–15)
BUN: 6 mg/dL (ref 6–23)
CO2: 21 mEq/L (ref 19–32)
Calcium: 8.9 mg/dL (ref 8.4–10.5)
Chloride: 104 mEq/L (ref 96–112)
Creatinine, Ser: 0.58 mg/dL (ref 0.50–1.10)
GFR calc Af Amer: >90 mL/min (ref >90)
GFR calc non Af Amer: >90 mL/min (ref >90)
Glucose, Bld: 97 mg/dL (ref 70–99)
Potassium: 3.4 mEq/L — ABNORMAL LOW (ref 3.7–5.3)
Sodium: 140 mEq/L (ref 137–147)
Total Bilirubin: <0.2 mg/dL — ABNORMAL LOW (ref 0.3–1.2)
Total Protein: 7.6 g/dL (ref 6.0–8.3)

## 2014-11-01 LAB — LIPASE, BLOOD: Lipase: 30 U/L (ref 11–59)

## 2014-11-01 MED ORDER — HYDROMORPHONE HCL 1 MG/ML IJ SOLN
1.0000 mg | Freq: Once | INTRAMUSCULAR | Status: AC
  Administered 2014-11-01: 1 mg via INTRAVENOUS
  Filled 2014-11-01: qty 1

## 2014-11-01 MED ORDER — SODIUM CHLORIDE 0.9 % IV BOLUS (SEPSIS)
1000.0000 mL | Freq: Once | INTRAVENOUS | Status: AC
  Administered 2014-11-01: 1000 mL via INTRAVENOUS

## 2014-11-01 MED ORDER — DEXAMETHASONE SODIUM PHOSPHATE 10 MG/ML IJ SOLN
10.0000 mg | Freq: Once | INTRAMUSCULAR | Status: AC
  Administered 2014-11-01: 10 mg via INTRAVENOUS
  Filled 2014-11-01: qty 1

## 2014-11-01 MED ORDER — IOHEXOL 300 MG/ML  SOLN
25.0000 mL | INTRAMUSCULAR | Status: AC
  Administered 2014-11-01: 25 mL via ORAL

## 2014-11-01 MED ORDER — ONDANSETRON HCL 4 MG/2ML IJ SOLN
4.0000 mg | Freq: Once | INTRAMUSCULAR | Status: DC
  Filled 2014-11-01: qty 2

## 2014-11-01 MED ORDER — IOHEXOL 300 MG/ML  SOLN
100.0000 mL | Freq: Once | INTRAMUSCULAR | Status: AC | PRN
  Administered 2014-11-01: 100 mL via INTRAVENOUS

## 2014-11-01 MED ORDER — ONDANSETRON HCL 4 MG/2ML IJ SOLN
4.0000 mg | Freq: Once | INTRAMUSCULAR | Status: AC
  Administered 2014-11-01: 4 mg via INTRAVENOUS

## 2014-11-01 MED ORDER — CIPROFLOXACIN IN D5W 400 MG/200ML IV SOLN
400.0000 mg | Freq: Once | INTRAVENOUS | Status: AC
  Administered 2014-11-01: 400 mg via INTRAVENOUS
  Filled 2014-11-01: qty 200

## 2014-11-01 MED ORDER — MORPHINE SULFATE 4 MG/ML IJ SOLN
4.0000 mg | Freq: Once | INTRAMUSCULAR | Status: AC
Start: 2014-11-01 — End: 2014-11-01
  Administered 2014-11-01: 4 mg via INTRAVENOUS
  Filled 2014-11-01: qty 1

## 2014-11-01 MED ORDER — METRONIDAZOLE IN NACL 5-0.79 MG/ML-% IV SOLN
500.0000 mg | Freq: Once | INTRAVENOUS | Status: AC
  Administered 2014-11-02: 500 mg via INTRAVENOUS
  Filled 2014-11-01: qty 100

## 2014-11-01 NOTE — ED Notes (Signed)
Pt reports she has Crohns disease with an ileostomy. States she has been having abd pain with burning and has been having bright red blood from her rectum and bowel movements.

## 2014-11-01 NOTE — H&P (Signed)
[facility] History and Physical  Patient: Jessica [name]  DUK:[id number]  DOB: [birth year]  DOS: the patient was seen and examined on 11/01/2014 PCP: No PCP Per Patient  Chief Complaint: Rectal bleeding  HPI: Jessica [name] is a 18 y.o. female with Past medical history of Crohn's disease and ileostomy. The patient is presenting with complaints of rectal bleeding. She was diagnosed with Crohn's at age 34 by Dr. Rodman Pickle. She underwent a using the resection in 2012 at [facility]. She has been on low-dose steroids and has never been on any immunosuppressive therapy. Since 2012 she has stopped following at pediatric GI at [facility]. Since last one year she is not on any medication for Crohn's disease. She mentions since last 2 weeks she has been having chronic abdominal pain with 1 mL episodes of rectal bleeding. She mentions she is not supposed to have any bowel movements from her rectum but she still continues to have them and they're occasionally has some blood but since last 2 weeks she has blood on the time. She denies any fever or chills denies any vomiting but complains of nausea. She complains of some acid reflux. She denies any chest pain shortness of breath. She was diagnosed recently with a uterine pregnancy and a missed abortion and has undergone D&C on 10/31/2014.  The patient is coming from home. And at her baseline independent for most of her ADL.  Review of Systems: as mentioned in the history of present illness.  A Comprehensive review of the other systems is negative.  Past Medical History  Diagnosis Date  . Crohn's disease   . Anemia   . Ileostomy in place    Past Surgical History  Procedure Laterality Date  . Small intestine surgery    . Incision and drainage Left     arm   Social History:  reports that she has never smoked. She has never used smokeless tobacco. She reports that she does not drink alcohol or use illicit drugs.  No Known Allergies  Family History   Problem Relation Age of Onset  . Diabetes Father   . Stroke Neg Hx   . Diabetes Maternal Grandmother   . Heart disease Maternal Grandmother   . Hypertension Maternal Grandmother   . Kidney disease Maternal Grandmother   . Cancer Maternal Grandfather     lung  . Hyperlipidemia Maternal Grandfather     Prior to Admission medications   Medication Sig Start Date End Date Taking? Authorizing Provider  acetaminophen (TYLENOL) 500 MG tablet Take 500 mg by mouth every 6 (six) hours as needed for mild pain.   Yes Historical Provider, MD  oxyCODONE-acetaminophen (PERCOCET) 5-325 MG per tablet Take 2 tablets by mouth every 6 (six) hours as needed for severe pain. 10/31/14  Yes Lahoma Crocker, MD  PATADAY 0.2 % SOLN Place 1 drop into both eyes every other day.  08/29/14  Yes Historical Provider, MD  norethindrone-ethinyl estradiol (MICROGESTIN,JUNEL,LOESTRIN) 1-20 MG-MCG tablet Take 1 tablet by mouth daily. Start in 3 weeks 10/31/14   Lahoma Crocker, MD  Prenat-FeCbn-FeAspGl-FA-Omega (OB COMPLETE PETITE) 35-5-1-200 MG CAPS Take 1 capsule by mouth daily. 10/21/14   Lahoma Crocker, MD    Physical Exam: Filed Vitals:   11/01/14 2045 11/01/14 2130 11/01/14 2300 11/01/14 2315  BP: 124/75 113/79 119/74   Pulse: 25 81 92 80  Temp:      TempSrc:      Resp:      Height:      Weight:  SpO2: 97% 100% 99% 99%    General: Alert, Awake and Oriented to Time, Place and Person. Appear in mild distress Eyes: PERRL ENT: Oral Mucosa clear moist. Neck: no JVD Cardiovascular: S1 and S2 Present, no Murmur, Peripheral Pulses Present Respiratory: Bilateral Air entry equal and Decreased, Clear to Auscultation, noCrackles, no wheezes Abdomen: Bowel Sound present, Soft and diffusely tender Skin: no Rash Extremities: no Pedal edema, no calf tenderness Neurologic: Grossly no focal neuro deficit.  Labs on Admission:  CBC:  Recent Labs Lab 10/31/14 1115 11/01/14 1853  WBC 8.6 10.0  NEUTROABS   --  6.0  HGB 12.6 11.3*  HCT 37.5 34.5*  MCV 87.6 88.2  PLT 279 300    CMP     Component Value Date/Time   NA 140 11/01/2014 1853   K 3.4* 11/01/2014 1853   CL 104 11/01/2014 1853   CO2 21 11/01/2014 1853   GLUCOSE 97 11/01/2014 1853   BUN 6 11/01/2014 1853   CREATININE 0.58 11/01/2014 1853   CALCIUM 8.9 11/01/2014 1853   PROT 7.6 11/01/2014 1853   ALBUMIN 3.1* 11/01/2014 1853   AST 14 11/01/2014 1853   ALT 15 11/01/2014 1853   ALKPHOS 81 11/01/2014 1853   BILITOT <0.2* 11/01/2014 1853   GFRNONAA >90 11/01/2014 1853   GFRAA >90 11/01/2014 1853     Recent Labs Lab 11/01/14 1853  LIPASE 30   No results for input(s): AMMONIA in the last 168 hours.  No results for input(s): CKTOTAL, CKMB, CKMBINDEX, TROPONINI in the last 168 hours. BNP (last 3 results) No results for input(s): PROBNP in the last 8760 hours.  Radiological Exams on Admission: Ct Abdomen Pelvis W Contrast  11/01/2014   CLINICAL DATA:  Burning abdominal pain, bright red blood in bowel movements  EXAM: CT ABDOMEN AND PELVIS WITH CONTRAST  TECHNIQUE: Multidetector CT imaging of the abdomen and pelvis was performed using the standard protocol following bolus administration of intravenous contrast.  CONTRAST:  172m OMNIPAQUE IOHEXOL 300 MG/ML  SOLN  COMPARISON:  None.  FINDINGS: The lung bases are clear.  The liver demonstrates no focal abnormality. There is no intrahepatic or extrahepatic biliary ductal dilatation. The gallbladder is normal. The spleen demonstrates no focal abnormality. The kidneys, adrenal glands and pancreas are normal. The bladder is unremarkable.  There is no bowel dilatation. 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 There is ascending and transverse colon bowel wall thickening without significant pericolonic inflammatory changes. The descending colon is difficult evaluate secondary to underdistention. There is mild bowel wall  thickening involving the rectum which may reflect mild proctitis. There is a small fat containing umbilical hernia. There is no pneumoperitoneum, pneumatosis, or portal venous gas. There is no abdominal or pelvic free fluid. There is no lymphadenopathy.  The abdominal aorta is normal in caliber .  There are no lytic or sclerotic osseous lesions.  IMPRESSION: 1. Mild ascending and transverse colitis which may be secondary to an infectious etiologies versus Crohn's disease. There is mild bowel wall thickening involving the rectum which may reflect mild proctitis. 2. 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   Electronically Signed   By: HKathreen Devoid  On: 11/01/2014 22:38    Assessment/Plan Principal Problem:   CC (Crohn's colitis) Active Problems:   Ileostomy in place   Missed abortion   Rectal bleeding   1. CC (Crohn's colitis) The patient is  presenting with complaints of rectal bleeding and abdominal pain. CT scan of the abdomen also evidence ofmild ascending and transverse colitis and ileostomy with parastomal hernia. At present she will be admitted in the hospital for mild Crohn's flareup. I would keep her nothing by mouth except medication. We discussed the case with GI in the morning. I will give her IV Solu-Medrol 60 mg daily and also Cipro and Flagyl. We will check ESR and CRP in the morning. Continue monitoring H&H. Nothing by mouth except medications at present  2.ileostomy with hernia. Patient has not been following up with primary surgeon. At present continue close monitoring.  3.recent missed abortion. Continue close monitoring.  Advance goals of care discussion: full code   Consults: gastroenterology  DVT Prophylaxis: mechanical compression device Nutrition: nothing by mouth  Disposition: Admitted to inpatient in med-surge unit.  Author: Berle Mull, MD [facility] Pager: [phone] 11/01/2014,  11:58 PM    If 7PM-7AM, please contact night-coverage [email or web address] Password [email or web address]

## 2014-11-01 NOTE — ED Notes (Signed)
Pt ambulated to restroom without difficulty

## 2014-11-01 NOTE — ED Provider Notes (Addendum)
CSN: [id number]     Arrival date & time 11/01/14  1827 History   First MD Initiated Contact with Patient 11/01/14 1856     Chief Complaint  Patient presents with  . Abdominal Pain     (Consider location/radiation/quality/duration/timing/severity/associated sxs/prior Treatment) HPI.... Generalized abdominal pain for 2 weeks getting worse the past 2 days. Patient has Crohn's disease and is status post ileostomy [facility] in 2011. She has had no follow-up since that time. She has had some blood in her bowel movements. She is eating but not as much as normal. No vomiting. No fever or chills.  Pain is moderate. No radiation of pain.  Past Medical History  Diagnosis Date  . Crohn's disease   . Anemia   . Ileostomy in place    Past Surgical History  Procedure Laterality Date  . Small intestine surgery    . Incision and drainage Left     arm   Family History  Problem Relation Age of Onset  . Diabetes Father   . Stroke Neg Hx   . Diabetes Maternal Grandmother   . Heart disease Maternal Grandmother   . Hypertension Maternal Grandmother   . Kidney disease Maternal Grandmother   . Cancer Maternal Grandfather     lung  . Hyperlipidemia Maternal Grandfather    History  Substance Use Topics  . Smoking status: Never Smoker   . Smokeless tobacco: Never Used  . Alcohol Use: No   OB History    Gravida Para Term Preterm AB TAB SAB Ectopic Multiple Living   2 1 1       1      Review of Systems  All other systems reviewed and are negative.     Allergies  Review of patient's allergies indicates no known allergies.  Home Medications   Prior to Admission medications   Medication Sig Start Date End Date Taking? Authorizing Provider  acetaminophen (TYLENOL) 500 MG tablet Take 500 mg by mouth every 6 (six) hours as needed for mild pain.   Yes Historical Provider, MD  oxyCODONE-acetaminophen (PERCOCET) 5-325 MG per tablet Take 2 tablets by mouth every 6 (six) hours as needed for  severe pain. 10/31/14  Yes Lahoma Crocker, MD  PATADAY 0.2 % SOLN Place 1 drop into both eyes every other day.  08/29/14  Yes Historical Provider, MD  norethindrone-ethinyl estradiol (MICROGESTIN,JUNEL,LOESTRIN) 1-20 MG-MCG tablet Take 1 tablet by mouth daily. Start in 3 weeks 10/31/14   Lahoma Crocker, MD  Prenat-FeCbn-FeAspGl-FA-Omega (OB COMPLETE PETITE) 35-5-1-200 MG CAPS Take 1 capsule by mouth daily. 10/21/14   Lahoma Crocker, MD   BP 113/79 mmHg  Pulse 81  Temp(Src) 98.7 F (37.1 C) (Oral)  Resp 18  Ht 5' 6"  (1.676 m)  Wt 159 lb (72.122 kg)  BMI 25.68 kg/m2  SpO2 100%  LMP 06/25/2014  Breastfeeding? Unknown Physical Exam  Constitutional: She is oriented to person, place, and time. She appears well-developed and well-nourished.  HENT:  Head: Normocephalic and atraumatic.  Eyes: Conjunctivae and EOM are normal. Pupils are equal, round, and reactive to light.  Neck: Normal range of motion. Neck supple.  Cardiovascular: Normal rate, regular rhythm and normal heart sounds.   Pulmonary/Chest: Effort normal and breath sounds normal.  Abdominal: Soft. Bowel sounds are normal.  Ileostomy in right lower quadrant draining stool.  Genitourinary:  Rectal exam: No masses. No gross blood.  Musculoskeletal: Normal range of motion.  Neurological: She is alert and oriented to person, place, and time.  Skin:  Skin is warm and dry.  Psychiatric: She has a normal mood and affect. Her behavior is normal.  Nursing note and vitals reviewed.   ED Course  Procedures (including critical care time) Labs Review Labs Reviewed  CBC WITH DIFFERENTIAL - Abnormal; Notable for the following:    Hemoglobin 11.3 (*)    HCT 34.5 (*)    All other components within normal limits  COMPREHENSIVE METABOLIC PANEL - Abnormal; Notable for the following:    Potassium 3.4 (*)    Albumin 3.1 (*)    Total Bilirubin <0.2 (*)    All other components within normal limits  URINALYSIS, ROUTINE W REFLEX  MICROSCOPIC - Abnormal; Notable for the following:    Color, Urine RED (*)    APPearance CLOUDY (*)    Hgb urine dipstick LARGE (*)    Leukocytes, UA SMALL (*)    All other components within normal limits  URINE MICROSCOPIC-ADD ON - Abnormal; Notable for the following:    Squamous Epithelial / LPF FEW (*)    All other components within normal limits  LIPASE, BLOOD  POC OCCULT BLOOD, ED    Imaging Review Ct Abdomen Pelvis W Contrast  11/01/2014   CLINICAL DATA:  Burning abdominal pain, bright red blood in bowel movements  EXAM: CT ABDOMEN AND PELVIS WITH CONTRAST  TECHNIQUE: Multidetector CT imaging of the abdomen and pelvis was performed using the standard protocol following bolus administration of intravenous contrast.  CONTRAST:  170m OMNIPAQUE IOHEXOL 300 MG/ML  SOLN  COMPARISON:  None.  FINDINGS: The lung bases are clear.  The liver demonstrates no focal abnormality. There is no intrahepatic or extrahepatic biliary ductal dilatation. The gallbladder is normal. The spleen demonstrates no focal abnormality. The kidneys, adrenal glands and pancreas are normal. The bladder is unremarkable.  There is no bowel dilatation. 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 There is ascending and transverse colon bowel wall thickening without significant pericolonic inflammatory changes. The descending colon is difficult evaluate secondary to underdistention. There is mild bowel wall thickening involving the rectum which may reflect mild proctitis. There is a small fat containing umbilical hernia. There is no pneumoperitoneum, pneumatosis, or portal venous gas. There is no abdominal or pelvic free fluid. There is no lymphadenopathy.  The abdominal aorta is normal in caliber .  There are no lytic or sclerotic osseous lesions.  IMPRESSION: 1. Mild ascending and transverse colitis which may be secondary to an infectious etiologies versus Crohn's  disease. There is mild bowel wall thickening involving the rectum which may reflect mild proctitis. 2. 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   Electronically Signed   By: HKathreen Devoid  On: 11/01/2014 22:38     EKG Interpretation None      MDM   Final diagnoses:  Crohn disease    CT scan reveals mild ascending and transverse colitis and mild proctitis. Rx IV fluids, pain management, IV Flagyl, IV Cipro, IV Decadron. Admit to general medicine.    BNat Christen MD 11/01/14 2Bradshaw MD 11/01/14 [id number]

## 2014-11-02 DIAGNOSIS — K625 Hemorrhage of anus and rectum: Secondary | ICD-10-CM | POA: Diagnosis present

## 2014-11-02 DIAGNOSIS — K5900 Constipation, unspecified: Secondary | ICD-10-CM | POA: Diagnosis present

## 2014-11-02 DIAGNOSIS — Z932 Ileostomy status: Secondary | ICD-10-CM | POA: Diagnosis not present

## 2014-11-02 DIAGNOSIS — K5010 Crohn's disease of large intestine without complications: Secondary | ICD-10-CM | POA: Diagnosis present

## 2014-11-02 DIAGNOSIS — K50813 Crohn's disease of both small and large intestine with fistula: Secondary | ICD-10-CM | POA: Diagnosis present

## 2014-11-02 DIAGNOSIS — K5090 Crohn's disease, unspecified, without complications: Secondary | ICD-10-CM | POA: Diagnosis present

## 2014-11-02 DIAGNOSIS — D649 Anemia, unspecified: Secondary | ICD-10-CM | POA: Diagnosis present

## 2014-11-02 DIAGNOSIS — K50111 Crohn's disease of large intestine with rectal bleeding: Secondary | ICD-10-CM

## 2014-11-02 LAB — COMPREHENSIVE METABOLIC PANEL
ALT: 13 U/L (ref 0–35)
AST: 16 U/L (ref 0–37)
Albumin: 2.9 g/dL — ABNORMAL LOW (ref 3.5–5.2)
Alkaline Phosphatase: 72 U/L (ref 39–117)
Anion gap: 13 (ref 5–15)
BUN: 4 mg/dL — ABNORMAL LOW (ref 6–23)
CO2: 19 mEq/L (ref 19–32)
Calcium: 8.5 mg/dL (ref 8.4–10.5)
Chloride: 104 mEq/L (ref 96–112)
Creatinine, Ser: 0.5 mg/dL (ref 0.50–1.10)
GFR calc Af Amer: >90 mL/min (ref >90)
GFR calc non Af Amer: >90 mL/min (ref >90)
Glucose, Bld: 102 mg/dL — ABNORMAL HIGH (ref 70–99)
Potassium: 4 mEq/L (ref 3.7–5.3)
Sodium: 136 mEq/L — ABNORMAL LOW (ref 137–147)
Total Bilirubin: 0.2 mg/dL — ABNORMAL LOW (ref 0.3–1.2)
Total Protein: 7 g/dL (ref 6.0–8.3)

## 2014-11-02 LAB — SEDIMENTATION RATE: Sed Rate: 50 mm/hr — ABNORMAL HIGH (ref 0–22)

## 2014-11-02 LAB — C-REACTIVE PROTEIN: CRP: 1.1 mg/dL — ABNORMAL HIGH (ref <0.60)

## 2014-11-02 LAB — CBC
HCT: 31.7 % — ABNORMAL LOW (ref 36.0–46.0)
Hemoglobin: 10.4 g/dL — ABNORMAL LOW (ref 12.0–15.0)
MCH: 29.4 pg (ref 26.0–34.0)
MCHC: 32.8 g/dL (ref 30.0–36.0)
MCV: 89.5 fL (ref 78.0–100.0)
Platelets: 252 10*3/uL (ref 150–400)
RBC: 3.54 MIL/uL — ABNORMAL LOW (ref 3.87–5.11)
RDW: 13.2 % (ref 11.5–15.5)
WBC: 10 10*3/uL (ref 4.0–10.5)

## 2014-11-02 LAB — HEMOGLOBIN: Hemoglobin: 10.4 g/dL — ABNORMAL LOW (ref 12.0–15.0)

## 2014-11-02 LAB — PROTIME-INR
INR: 1.05 (ref 0.00–1.49)
Prothrombin Time: 13.8 seconds (ref 11.6–15.2)

## 2014-11-02 MED ORDER — ACETAMINOPHEN 325 MG PO TABS: 650.0000 mg | ORAL_TABLET | Freq: Four times a day (QID) | ORAL | Status: DC | PRN

## 2014-11-02 MED ORDER — MORPHINE SULFATE 2 MG/ML IJ SOLN
2.0000 mg | INTRAMUSCULAR | Status: DC | PRN
  Administered 2014-11-02 – 2014-11-05 (×17): 4 mg via INTRAVENOUS
  Administered 2014-11-05: 2 mg via INTRAVENOUS
  Administered 2014-11-05 – 2014-11-07 (×10): 4 mg via INTRAVENOUS
  Administered 2014-11-07: 2 mg via INTRAVENOUS
  Administered 2014-11-07: 4 mg via INTRAVENOUS
  Filled 2014-11-02 (×30): qty 2

## 2014-11-02 MED ORDER — SODIUM CHLORIDE 0.9 % IV SOLN
INTRAVENOUS | Status: DC
  Administered 2014-11-02 – 2014-11-06 (×9): via INTRAVENOUS

## 2014-11-02 MED ORDER — ACETAMINOPHEN 650 MG RE SUPP
650.0000 mg | Freq: Four times a day (QID) | RECTAL | Status: DC | PRN
Start: 2014-11-02 — End: 2014-11-07

## 2014-11-02 MED ORDER — PANTOPRAZOLE SODIUM 40 MG IV SOLR
40.0000 mg | Freq: Two times a day (BID) | INTRAVENOUS | Status: DC
  Administered 2014-11-02 – 2014-11-05 (×8): 40 mg via INTRAVENOUS
  Filled 2014-11-02 (×11): qty 40

## 2014-11-02 MED ORDER — ONDANSETRON HCL 4 MG/2ML IJ SOLN: 4.0000 mg | Freq: Three times a day (TID) | INTRAMUSCULAR | Status: DC | PRN

## 2014-11-02 MED ORDER — CHLORHEXIDINE GLUCONATE 0.12 % MT SOLN
15.0000 mL | Freq: Two times a day (BID) | OROMUCOSAL | Status: DC
  Administered 2014-11-02 – 2014-11-07 (×10): 15 mL via OROMUCOSAL
  Filled 2014-11-02 (×10): qty 15

## 2014-11-02 MED ORDER — ONDANSETRON HCL 4 MG/2ML IJ SOLN: 4.0000 mg | Freq: Four times a day (QID) | INTRAMUSCULAR | Status: DC | PRN

## 2014-11-02 MED ORDER — OXYCODONE-ACETAMINOPHEN 5-325 MG PO TABS
1.0000 | ORAL_TABLET | ORAL | Status: DC | PRN
  Administered 2014-11-02 – 2014-11-07 (×9): 2 via ORAL
  Filled 2014-11-02 (×10): qty 2

## 2014-11-02 MED ORDER — METHYLPREDNISOLONE SODIUM SUCC 125 MG IJ SOLR
60.0000 mg | INTRAMUSCULAR | Status: DC
  Administered 2014-11-02 – 2014-11-06 (×6): 60 mg via INTRAVENOUS
  Filled 2014-11-02: qty 2
  Filled 2014-11-02 (×2): qty 0.96
  Filled 2014-11-02: qty 2
  Filled 2014-11-02: qty 0.96
  Filled 2014-11-02 (×2): qty 2
  Filled 2014-11-02 (×5): qty 0.96

## 2014-11-02 MED ORDER — CETYLPYRIDINIUM CHLORIDE 0.05 % MT LIQD
7.0000 mL | Freq: Two times a day (BID) | OROMUCOSAL | Status: DC
  Administered 2014-11-02 – 2014-11-06 (×5): 7 mL via OROMUCOSAL

## 2014-11-02 MED ORDER — METRONIDAZOLE IN NACL 5-0.79 MG/ML-% IV SOLN
500.0000 mg | Freq: Three times a day (TID) | INTRAVENOUS | Status: DC
  Administered 2014-11-02 – 2014-11-07 (×15): 500 mg via INTRAVENOUS
  Filled 2014-11-02 (×19): qty 100

## 2014-11-02 MED ORDER — ONDANSETRON HCL 4 MG PO TABS
4.0000 mg | ORAL_TABLET | Freq: Four times a day (QID) | ORAL | Status: DC | PRN
Start: 2014-11-02 — End: 2014-11-07

## 2014-11-02 MED ORDER — CIPROFLOXACIN IN D5W 400 MG/200ML IV SOLN
400.0000 mg | Freq: Two times a day (BID) | INTRAVENOUS | Status: DC
  Administered 2014-11-02 (×2): 400 mg via INTRAVENOUS
  Filled 2014-11-02 (×4): qty 200

## 2014-11-02 NOTE — Consult Note (Signed)
Consult for [facility]  Reason for Consult: Hematochezia Referring Physician: [facility]  [name] HPI: This is a 18 year old female with a PMH of Crohn's disease diagnosed at the age of 29, s/p ileostomy for a Crohn's fistula, and s/p D&C for a missed abortion on 10/31/2014 admitted for hematochezia.  She was treated at [facility] until 2012, but she stopped going when her mother lost her insurance.  The patient was also supposed to have a reanastamosis of her intestines.  Per her report, she only has small bowel Crohn's disease.  In addition to prednisone, she was treated with budesonide.  During her initial presentation for Crohn's disease she had constipation, no reports of hematochezia.  The hematochezia only started after her ileostomy, but even then, it was a couple of years later.  Currently she reports that it is rather frequent.  A CT scan reveals colitis and a peristomoal hernia.  Past Medical History  Diagnosis Date  . Crohn's disease   . Anemia   . Ileostomy in place     Past Surgical History  Procedure Laterality Date  . Small intestine surgery    . Incision and drainage Left     arm    Family History  Problem Relation Age of Onset  . Diabetes Father   . Stroke Neg Hx   . Diabetes Maternal Grandmother   . Heart disease Maternal Grandmother   . Hypertension Maternal Grandmother   . Kidney disease Maternal Grandmother   . Cancer Maternal Grandfather     lung  . Hyperlipidemia Maternal Grandfather     Social History:  reports that she has never smoked. She has never used smokeless tobacco. She reports that she does not drink alcohol or use illicit drugs.  Allergies: No Known Allergies  Medications:  Scheduled: . antiseptic oral rinse  7 mL Mouth Rinse q12n4p  . chlorhexidine  15 mL Mouth Rinse BID  . ciprofloxacin  400 mg Intravenous Q12H  . methylPREDNISolone (SOLU-MEDROL) injection  60 mg Intravenous Q24H  . metronidazole  500 mg Intravenous  Q8H  . pantoprazole (PROTONIX) IV  40 mg Intravenous Q12H   Continuous: . sodium chloride 100 mL/hr at 11/02/14 0553    Results for orders placed or performed during the hospital encounter of 11/01/14 (from the past 24 hour(s))  CBC with Differential     Status: Abnormal   Collection Time: 11/01/14  6:53 PM  Result Value Ref Range   WBC 10.0 4.0 - 10.5 K/uL   RBC 3.91 3.87 - 5.11 MIL/uL   Hemoglobin 11.3 (L) 12.0 - 15.0 g/dL   HCT 34.5 (L) 36.0 - 46.0 %   MCV 88.2 78.0 - 100.0 fL   MCH 28.9 26.0 - 34.0 pg   MCHC 32.8 30.0 - 36.0 g/dL   RDW 13.1 11.5 - 15.5 %   Platelets 300 150 - 400 K/uL   Neutrophils Relative % 60 43 - 77 %   Neutro Abs 6.0 1.7 - 7.7 K/uL   Lymphocytes Relative 29 12 - 46 %   Lymphs Abs 2.9 0.7 - 4.0 K/uL   Monocytes Relative 10 3 - 12 %   Monocytes Absolute 1.0 0.1 - 1.0 K/uL   Eosinophils Relative 1 0 - 5 %   Eosinophils Absolute 0.1 0.0 - 0.7 K/uL   Basophils Relative 0 0 - 1 %   Basophils Absolute 0.0 0.0 - 0.1 K/uL  Comprehensive metabolic panel     Status: Abnormal  Collection Time: 11/01/14  6:53 PM  Result Value Ref Range   Sodium 140 137 - 147 mEq/L   Potassium 3.4 (L) 3.7 - 5.3 mEq/L   Chloride 104 96 - 112 mEq/L   CO2 21 19 - 32 mEq/L   Glucose, Bld 97 70 - 99 mg/dL   BUN 6 6 - 23 mg/dL   Creatinine, Ser 0.58 0.50 - 1.10 mg/dL   Calcium 8.9 8.4 - 10.5 mg/dL   Total Protein 7.6 6.0 - 8.3 g/dL   Albumin 3.1 (L) 3.5 - 5.2 g/dL   AST 14 0 - 37 U/L   ALT 15 0 - 35 U/L   Alkaline Phosphatase 81 39 - 117 U/L   Total Bilirubin <0.2 (L) 0.3 - 1.2 mg/dL   GFR calc non Af Amer >90 >90 mL/min   GFR calc Af Amer >90 >90 mL/min   Anion gap 15 5 - 15  Lipase, blood     Status: None   Collection Time: 11/01/14  6:53 PM  Result Value Ref Range   Lipase 30 11 - 59 U/L  Urinalysis, Routine w reflex microscopic     Status: Abnormal   Collection Time: 11/01/14  8:45 PM  Result Value Ref Range   Color, Urine RED (A) YELLOW   APPearance CLOUDY (A)  CLEAR   Specific Gravity, Urine 1.013 1.005 - 1.030   pH 6.0 5.0 - 8.0   Glucose, UA NEGATIVE NEGATIVE mg/dL   Hgb urine dipstick LARGE (A) NEGATIVE   Bilirubin Urine NEGATIVE NEGATIVE   Ketones, ur NEGATIVE NEGATIVE mg/dL   Protein, ur NEGATIVE NEGATIVE mg/dL   Urobilinogen, UA 0.2 0.0 - 1.0 mg/dL   Nitrite NEGATIVE NEGATIVE   Leukocytes, UA SMALL (A) NEGATIVE  Urine microscopic-add on     Status: Abnormal   Collection Time: 11/01/14  8:45 PM  Result Value Ref Range   Squamous Epithelial / LPF FEW (A) RARE   WBC, UA 3-6 <3 WBC/hpf   RBC / HPF TOO NUMEROUS TO COUNT <3 RBC/hpf  POC occult blood, ED Provider will collect     Status: Abnormal   Collection Time: 11/01/14 11:02 PM  Result Value Ref Range   Fecal Occult Bld POSITIVE (A) NEGATIVE  Comprehensive metabolic panel     Status: Abnormal   Collection Time: 11/02/14  2:30 AM  Result Value Ref Range   Sodium 136 (L) 137 - 147 mEq/L   Potassium 4.0 3.7 - 5.3 mEq/L   Chloride 104 96 - 112 mEq/L   CO2 19 19 - 32 mEq/L   Glucose, Bld 102 (H) 70 - 99 mg/dL   BUN 4 (L) 6 - 23 mg/dL   Creatinine, Ser 0.50 0.50 - 1.10 mg/dL   Calcium 8.5 8.4 - 10.5 mg/dL   Total Protein 7.0 6.0 - 8.3 g/dL   Albumin 2.9 (L) 3.5 - 5.2 g/dL   AST 16 0 - 37 U/L   ALT 13 0 - 35 U/L   Alkaline Phosphatase 72 39 - 117 U/L   Total Bilirubin 0.2 (L) 0.3 - 1.2 mg/dL   GFR calc non Af Amer >90 >90 mL/min   GFR calc Af Amer >90 >90 mL/min   Anion gap 13 5 - 15  CBC     Status: Abnormal   Collection Time: 11/02/14  2:30 AM  Result Value Ref Range   WBC 10.0 4.0 - 10.5 K/uL   RBC 3.54 (L) 3.87 - 5.11 MIL/uL   Hemoglobin 10.4 (L) 12.0 -  15.0 g/dL   HCT 31.7 (L) 36.0 - 46.0 %   MCV 89.5 78.0 - 100.0 fL   MCH 29.4 26.0 - 34.0 pg   MCHC 32.8 30.0 - 36.0 g/dL   RDW 13.2 11.5 - 15.5 %   Platelets 252 150 - 400 K/uL  Protime-INR     Status: None   Collection Time: 11/02/14  2:30 AM  Result Value Ref Range   Prothrombin Time 13.8 11.6 - 15.2 seconds   INR  1.05 0.00 - 1.49  Sedimentation rate     Status: Abnormal   Collection Time: 11/02/14  2:30 AM  Result Value Ref Range   Sed Rate 50 (H) 0 - 22 mm/hr     Ct Abdomen Pelvis W Contrast  11/01/2014   CLINICAL DATA:  Burning abdominal pain, bright red blood in bowel movements  EXAM: CT ABDOMEN AND PELVIS WITH CONTRAST  TECHNIQUE: Multidetector CT imaging of the abdomen and pelvis was performed using the standard protocol following bolus administration of intravenous contrast.  CONTRAST:  122m OMNIPAQUE IOHEXOL 300 MG/ML  SOLN  COMPARISON:  None.  FINDINGS: The lung bases are clear.  The liver demonstrates no focal abnormality. There is no intrahepatic or extrahepatic biliary ductal dilatation. The gallbladder is normal. The spleen demonstrates no focal abnormality. The kidneys, adrenal glands and pancreas are normal. The bladder is unremarkable.  There is no bowel dilatation. 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 There is ascending and transverse colon bowel wall thickening without significant pericolonic inflammatory changes. The descending colon is difficult evaluate secondary to underdistention. There is mild bowel wall thickening involving the rectum which may reflect mild proctitis. There is a small fat containing umbilical hernia. There is no pneumoperitoneum, pneumatosis, or portal venous gas. There is no abdominal or pelvic free fluid. There is no lymphadenopathy.  The abdominal aorta is normal in caliber .  There are no lytic or sclerotic osseous lesions.  IMPRESSION: 1. Mild ascending and transverse colitis which may be secondary to an infectious etiologies versus Crohn's disease. There is mild bowel wall thickening involving the rectum which may reflect mild proctitis. 2. 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   Electronically Signed   By: HKathreen Devoid  On: 11/01/2014  22:38    ROS:  As stated above in the HPI otherwise negative.  Blood pressure 109/66, pulse 72, temperature 97.7 F (36.5 C), temperature source Oral, resp. rate 16, height 5' 6"  (1.676 m), weight 73.143 kg (161 lb 4 oz), last menstrual period 06/25/2014, SpO2 97 %, unknown if currently breastfeeding.    PE: Gen: NAD, Alert and Oriented HEENT:  [place]/AT, EOMI Neck: Supple, no LAD Lungs: CTA Bilaterally CV: RRR without M/G/R ABM: Soft, NTND, +BS Ext: No C/C/E  Assessment/Plan: 1) Diversion colitis. 2) Small bowel Crohn's disease.   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  The best treatment is to restore continuity with her ileum, however, she can be treated with short-chain fatty acids for the diversion colitis.  Unfortunately, I am uncertain about how to order this treatment.  It is not unreasonable to evaluate her colon endoscopically, but only a FFS will be feasible as her colon cannot be prepped.  Plan: 1) Trial of short-chain fatty acids enemas, if this can be obtained. 2) Near future evaluation to restore continuity of her small and large intestines.   Dasha Kawabata D 11/02/2014, 10:06  AM

## 2014-11-02 NOTE — Progress Notes (Signed)
Patient Demographics  Jessica [name], is a 18 y.o. female, DOB - 04-01-1996, XNA:[id number]  Admit date - 11/01/2014   Admitting Physician Jessica Mull, MD  Outpatient Primary MD for the patient is No PCP Per Patient  LOS - 1   Chief Complaint  Patient presents with  . Abdominal Pain     Admission history of present illness/brief narrative: This is a 18 year old female with a PMH of Crohn's disease diagnosed at the age of 69, s/p ileostomy for a Crohn's fistula, and s/p D&C for a missed abortion on 10/31/2014 admitted for hematochezia. She was treated at [facility] until 2012, but she stopped going when her mother lost her insurance. The patient was also supposed to have a reanastamosis of her intestines. Per her report, she only has small bowel Crohn's disease. In addition to prednisone, she was treated with budesonide. During her initial presentation for Crohn's disease she had constipation, no reports of hematochezia. The hematochezia only started after her ileostomy, but even then, it was a couple of years later. Shunt was started empirically on IV Cipro, Flagyl, Solu-Medrol for her Crohn's flare, patient was seen by gastroenterology, were it felt is more likely that diversion colitis causing her hematochezia.   Subjective:   Jessica [name] today has, No headache, No chest pain, No abdominal pain - No Nausea, No new weakness tingling or numbness, No Cough - SOB.  Assessment & Plan    Principal Problem:   CC (Crohn's colitis) Active Problems:   Ileostomy in place   Missed abortion   Rectal bleeding  Hematochezia/abdominal pain: Acute Crohn's exacerbation versus diversion colitis, centrally consult is greatly appreciated, continue with IV ciprofloxacin, Flagyl, so limited role for the time being until further workup is done to rule out diversion  Colitis,  continue with when necessary pain and nausea medicine, continue with IV fluids.  Code Status: Full  Family Communication: patient is alert and oriented  Disposition Plan: remains inpatient   Procedures  none   Consults   Gastroenterology   Medications  Scheduled Meds: . antiseptic oral rinse  7 mL Mouth Rinse q12n4p  . chlorhexidine  15 mL Mouth Rinse BID  . ciprofloxacin  400 mg Intravenous Q12H  . methylPREDNISolone (SOLU-MEDROL) injection  60 mg Intravenous Q24H  . metronidazole  500 mg Intravenous Q8H  . pantoprazole (PROTONIX) IV  40 mg Intravenous Q12H   Continuous Infusions: . sodium chloride 100 mL/hr at 11/02/14 0553   PRN Meds:.acetaminophen **OR** acetaminophen, morphine injection, ondansetron **OR** ondansetron (ZOFRAN) IV, oxyCODONE-acetaminophen  DVT Prophylaxis SCDs  Lab Results  Component Value Date   PLT 252 11/02/2014    Antibiotics    Anti-infectives    Start     Dose/Rate Route Frequency Ordered Stop   11/02/14 1000  ciprofloxacin (CIPRO) IVPB 400 mg     400 mg200 mL/hr over 60 Minutes Intravenous Every 12 hours 11/02/14 0024     11/02/14 0030  metroNIDAZOLE (FLAGYL) IVPB 500 mg     500 mg100 mL/hr over 60 Minutes Intravenous Every 8 hours 11/02/14 0023     11/01/14 2300  ciprofloxacin (CIPRO) IVPB 400 mg     400 mg200 mL/hr over 60 Minutes Intravenous  Once 11/01/14 2251 11/02/14 0015  11/01/14 2300  metroNIDAZOLE (FLAGYL) IVPB 500 mg     500 mg100 mL/hr over 60 Minutes Intravenous  Once 11/01/14 2251 11/02/14 0122          Objective:   Filed Vitals:   11/01/14 2315 11/02/14 0053 11/02/14 0559 11/02/14 0602  BP:  125/87 109/66   Pulse: 80 82 72   Temp:  97.9 F (36.6 C) 97.7 F (36.5 C)   TempSrc:  Oral Oral   Resp:  16 16   Height:      Weight:    73.143 kg (161 lb 4 oz)  SpO2: 99% 100% 97%     Wt Readings from Last 3 Encounters:  11/02/14 73.143 kg (161 lb 4 oz)  10/29/14 72.485 kg (159 lb 12.8 oz)  10/21/14 73.029 kg  (161 lb) (88 %*, Z = 1.15)   * Growth percentiles are based on CDC 2-20 Years data.     Intake/Output Summary (Last 24 hours) at 11/02/14 1344 Last data filed at 11/02/14 0800  Gross per 24 hour  Intake    567 ml  Output    700 ml  Net   -133 ml     Physical Exam  Awake Alert, Oriented X 3, No new F.N deficits, Normal affect [name].AT,PERRAL Supple Neck,No JVD, No cervical lymphadenopathy appriciated.  Symmetrical Chest wall movement, Good air movement bilaterally, CTAB RRR,No Gallops,Rubs or new Murmurs, No Parasternal Heave +ve B.Sounds, Abd Soft, No tenderness, No organomegaly appriciated, No rebound - guarding or rigidity.Has iliostomy. No Cyanosis, Clubbing or edema, No new Rash or bruise    Data Review   Micro Results No results found for this or any previous visit (from the past 240 hour(s)).  Radiology Reports Ct Abdomen Pelvis W Contrast  11/01/2014   CLINICAL DATA:  Burning abdominal pain, bright red blood in bowel movements  EXAM: CT ABDOMEN AND PELVIS WITH CONTRAST  TECHNIQUE: Multidetector CT imaging of the abdomen and pelvis was performed using the standard protocol following bolus administration of intravenous contrast.  CONTRAST:  135m OMNIPAQUE IOHEXOL 300 MG/ML  SOLN  COMPARISON:  None.  FINDINGS: The lung bases are clear.  The liver demonstrates no focal abnormality. There is no intrahepatic or extrahepatic biliary ductal dilatation. The gallbladder is normal. The spleen demonstrates no focal abnormality. The kidneys, adrenal glands and pancreas are normal. The bladder is unremarkable.  There is no bowel dilatation. 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 There is ascending and transverse colon bowel wall thickening without significant pericolonic inflammatory changes. The descending colon is difficult evaluate secondary to underdistention. There is mild bowel wall thickening involving the rectum  which may reflect mild proctitis. There is a small fat containing umbilical hernia. There is no pneumoperitoneum, pneumatosis, or portal venous gas. There is no abdominal or pelvic free fluid. There is no lymphadenopathy.  The abdominal aorta is normal in caliber .  There are no lytic or sclerotic osseous lesions.  IMPRESSION: 1. Mild ascending and transverse colitis which may be secondary to an infectious etiologies versus Crohn's disease. There is mild bowel wall thickening involving the rectum which may reflect mild proctitis. 2. 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   Electronically Signed   By: HKathreen Devoid  On: 11/01/2014 22:38    CBC  Recent Labs Lab 10/31/14 1115 11/01/14 1853 11/02/14 0230  WBC 8.6 10.0 10.0  HGB 12.6 11.3*  10.4*  HCT 37.5 34.5* 31.7*  PLT 279 300 252  MCV 87.6 88.2 89.5  MCH 29.4 28.9 29.4  MCHC 33.6 32.8 32.8  RDW 13.1 13.1 13.2  LYMPHSABS  --  2.9  --   MONOABS  --  1.0  --   EOSABS  --  0.1  --   BASOSABS  --  0.0  --     Chemistries   Recent Labs Lab 11/01/14 1853 11/02/14 0230  NA 140 136*  K 3.4* 4.0  CL 104 104  CO2 21 19  GLUCOSE 97 102*  BUN 6 4*  CREATININE 0.58 0.50  CALCIUM 8.9 8.5  AST 14 16  ALT 15 13  ALKPHOS 81 72  BILITOT <0.2* 0.2*   ------------------------------------------------------------------------------------------------------------------ estimated creatinine clearance is 114.8 mL/min (by C-G formula based on Cr of 0.5). ------------------------------------------------------------------------------------------------------------------ No results for input(s): HGBA1C in the last 72 hours. ------------------------------------------------------------------------------------------------------------------ No results for input(s): CHOL, HDL, LDLCALC, TRIG, CHOLHDL, LDLDIRECT in the last 72  hours. ------------------------------------------------------------------------------------------------------------------ No results for input(s): TSH, T4TOTAL, T3FREE, THYROIDAB in the last 72 hours.  Invalid input(s): FREET3 ------------------------------------------------------------------------------------------------------------------ No results for input(s): VITAMINB12, FOLATE, FERRITIN, TIBC, IRON, RETICCTPCT in the last 72 hours.  Coagulation profile  Recent Labs Lab 11/02/14 0230  INR 1.05    No results for input(s): DDIMER in the last 72 hours.  Cardiac Enzymes No results for input(s): CKMB, TROPONINI, MYOGLOBIN in the last 168 hours.  Invalid input(s): CK ------------------------------------------------------------------------------------------------------------------ Invalid input(s): POCBNP     Time Spent in minutes   25 minutes    Azriella Mattia M.D on 11/02/2014 at 1:44 PM  Between 7am to 7pm - Pager - [phone]  After 7pm go to [email or web address] - password [email or web address]  And look for the night coverage person covering for me after hours  [facility] Office  [phone]   **Disclaimer: This note may have been dictated with voice recognition software. Similar sounding words can inadvertently be transcribed and this note may contain transcription errors which may not have been corrected upon publication of note.**

## 2014-11-03 ENCOUNTER — Encounter (HOSPITAL_COMMUNITY): Payer: Self-pay | Admitting: Obstetrics & Gynecology

## 2014-11-03 LAB — CBC
HCT: 30.2 % — ABNORMAL LOW (ref 36.0–46.0)
Hemoglobin: 10 g/dL — ABNORMAL LOW (ref 12.0–15.0)
MCH: 29.3 pg (ref 26.0–34.0)
MCHC: 33.1 g/dL (ref 30.0–36.0)
MCV: 88.6 fL (ref 78.0–100.0)
Platelets: 279 10*3/uL (ref 150–400)
RBC: 3.41 MIL/uL — ABNORMAL LOW (ref 3.87–5.11)
RDW: 13.3 % (ref 11.5–15.5)
WBC: 9 10*3/uL (ref 4.0–10.5)

## 2014-11-03 LAB — GI PATHOGEN PANEL BY PCR, STOOL
C difficile toxin A/B: NEGATIVE
Campylobacter by PCR: NEGATIVE
Cryptosporidium by PCR: NEGATIVE
E coli (ETEC) LT/ST: NEGATIVE
E coli (STEC): NEGATIVE
E coli 0157 by PCR: NEGATIVE
G lamblia by PCR: NEGATIVE
Norovirus GI/GII: POSITIVE
Rotavirus A by PCR: NEGATIVE
Salmonella by PCR: NEGATIVE
Shigella by PCR: NEGATIVE

## 2014-11-03 LAB — BASIC METABOLIC PANEL
Anion gap: 11 (ref 5–15)
BUN: 4 mg/dL — ABNORMAL LOW (ref 6–23)
CO2: 23 mEq/L (ref 19–32)
Calcium: 8.4 mg/dL (ref 8.4–10.5)
Chloride: 107 mEq/L (ref 96–112)
Creatinine, Ser: 0.43 mg/dL — ABNORMAL LOW (ref 0.50–1.10)
GFR calc Af Amer: >90 mL/min (ref >90)
GFR calc non Af Amer: >90 mL/min (ref >90)
Glucose, Bld: 135 mg/dL — ABNORMAL HIGH (ref 70–99)
Potassium: 3.7 mEq/L (ref 3.7–5.3)
Sodium: 141 mEq/L (ref 137–147)

## 2014-11-03 MED ORDER — FLEET ENEMA 7-19 GM/118ML RE ENEM
2.0000 | ENEMA | Freq: Once | RECTAL | Status: AC
  Administered 2014-11-04: 1 via RECTAL
  Filled 2014-11-03 (×2): qty 2

## 2014-11-03 MED ORDER — MESALAMINE 4 G RE ENEM
4.0000 g | ENEMA | Freq: Two times a day (BID) | RECTAL | Status: DC
  Administered 2014-11-03 – 2014-11-07 (×8): 4 g via RECTAL
  Filled 2014-11-03 (×12): qty 60

## 2014-11-03 NOTE — Progress Notes (Signed)
Patient ID: Jessica [name], female   DOB: February 26, 1996, 18 y.o.   MRN: [id number]  [facility] Progress Note  Subjective: Feels about the same- no bleeding through the night- still has abdominal discomfort which has been coming on for awhile HGB 10 Pt just had a D and C on 11/13 for missed AB No meds for Crohns for over 3 years Stool studies pending  Objective:  Vital signs in last 24 hours: Temp:  [97.6 F (36.4 C)-97.9 F (36.6 C)] 97.7 F (36.5 C) (11/16 0514) Pulse Rate:  [64-76] 64 (11/16 0514) Resp:  [16-18] 16 (11/16 0514) BP: (112-122)/(68-76) 112/68 mmHg (11/16 0514) SpO2:  [98 %-99 %] 99 % (11/16 0514) Last BM Date: 11/01/14 General:   Alert,  Well-developed,  AA female  in NAD Heart:  Regular rate and rhythm; no murmurs Pulm;clear Abdomen:  Soft, mild tenderness left abd and nondistended. Normal bowel sounds, without guarding, and without rebound.   Extremities:  Without edema. Neurologic:  Alert and  oriented x4;  grossly normal neurologically. Psych:  Alert and cooperative. Normal mood and affect.  Intake/Output from previous day: 11/15 0701 - 11/16 0700 In: 2687 [P.O.:720; I.V.:1767; IV Piggyback:200] Out: 100 [Stool:100] Intake/Output this shift:    Lab Results:  Recent Labs  11/01/14 1853 11/02/14 0230 11/02/14 1420 11/03/14 0538  WBC 10.0 10.0  --  9.0  HGB 11.3* 10.4* 10.4* 10.0*  HCT 34.5* 31.7*  --  30.2*  PLT 300 252  --  279   BMET  Recent Labs  11/01/14 1853 11/02/14 0230 11/03/14 0538  NA 140 136* 141  K 3.4* 4.0 3.7  CL 104 104 107  CO2 21 19 23   GLUCOSE 97 102* 135*  BUN 6 4* 4*  CREATININE 0.58 0.50 0.43*  CALCIUM 8.9 8.5 8.4   LFT  Recent Labs  11/02/14 0230  PROT 7.0  ALBUMIN 2.9*  AST 16  ALT 13  ALKPHOS 72  BILITOT 0.2*   PT/INR  Recent Labs  11/02/14 0230  LABPROT 13.8  INR 1.05    Assessment / Plan: #1 18 yo female with long hx of Crohns ileitis-s/p ileostomy several years ago- now with  gradually progressive abdominal pain, and mucoid blood from rectum- no change in output from ileostomy Rather diffuse colitis on Ct- ? Diversion colitis vs Crohns Doubt infectious- will stop cipro, continue flagyl for now On IV steroids Advance to full liquids  Await stool studies Start Rowasa enemas BID Will schedule  For Flex with bx in am tomorrow with Dr. Olevia Perches Principal Problem:   CC (Crohn's colitis) Active Problems:   Ileostomy in place   Missed abortion   Rectal bleeding     LOS: 2 days   Amy Esterwood  11/03/2014, 9:52 AM Attending MD note:   I have taken a history, examined the patient, and reviewed the chart. I agree with the Advanced Practitioner's impression and recommendations. For flex sigm tomorrow. Rowasa enemas started  [facility] Pager # [phone]

## 2014-11-03 NOTE — Progress Notes (Signed)
Patient Demographics  Jessica [name], is a 18 y.o. female, DOB - 1996/07/09, XJO:[id number]  Admit date - 11/01/2014   Admitting Physician Berle Mull, MD  Outpatient Primary MD for the patient is No PCP Per Patient  LOS - 2   Chief Complaint  Patient presents with  . Abdominal Pain     Admission history of present illness/brief narrative: This is a 18 year old female with a PMH of Crohn's disease diagnosed at the age of 66, s/p ileostomy for a Crohn's fistula, and s/p D&C for a missed abortion on 10/31/2014 admitted for hematochezia. She was treated at [facility] until 2012, but she stopped going when her mother lost her insurance. The patient was also supposed to have a reanastamosis of her intestines. Per her report, she only has small bowel Crohn's disease. In addition to prednisone, she was treated with budesonide. During her initial presentation for Crohn's disease she had constipation, no reports of hematochezia. The hematochezia only started after her ileostomy, but even then, it was a couple of years later. Shunt was started empirically on IV Cipro, Flagyl, Solu-Medrol for her Crohn's flare, patient was seen by gastroenterology, were it felt is more likely that diversion colitis causing her hematochezia.   Subjective:   Jessica [name] today has, No headache, No chest pain,  - No Nausea, No new weakness tingling or numbness, No Cough - SOB.The still complains of mild abdominal pain.  Assessment & Plan    Principal Problem:   CC (Crohn's colitis) Active Problems:   Ileostomy in place   Missed abortion   Rectal bleeding  Hematochezia/abdominal pain: - Acute Crohn's exacerbation versus diversion colitis,  - gastroenterology consult is greatly appreciated,plan is for sigmoidoscopy in a.m. - continue with IV Flagyl, stop IV ciprofloxacin (11/02/14 -  11/03/14) started on Rowasa enemas twice a day. -plan is for sigmoidoscopy in a.m. -continue with clear liquid diet  Code Status: Full  Family Communication: patient is alert and oriented  Disposition Plan: remains inpatient   Procedures  none   Consults   Gastroenterology   Medications  Scheduled Meds: . antiseptic oral rinse  7 mL Mouth Rinse q12n4p  . chlorhexidine  15 mL Mouth Rinse BID  . mesalamine  4 g Rectal BID  . methylPREDNISolone (SOLU-MEDROL) injection  60 mg Intravenous Q24H  . metronidazole  500 mg Intravenous Q8H  . pantoprazole (PROTONIX) IV  40 mg Intravenous Q12H  . [START ON 11/04/2014] sodium phosphate  2 enema Rectal Once   Continuous Infusions: . sodium chloride 100 mL/hr at 11/03/14 0555   PRN Meds:.acetaminophen **OR** acetaminophen, morphine injection, ondansetron **OR** ondansetron (ZOFRAN) IV, oxyCODONE-acetaminophen  DVT Prophylaxis SCDs  Lab Results  Component Value Date   PLT 279 11/03/2014    Antibiotics    Anti-infectives    Start     Dose/Rate Route Frequency Ordered Stop   11/02/14 1000  ciprofloxacin (CIPRO) IVPB 400 mg  Status:  Discontinued     400 mg200 mL/hr over 60 Minutes Intravenous Every 12 hours 11/02/14 0024 11/03/14 1013   11/02/14 0030  metroNIDAZOLE (FLAGYL) IVPB 500 mg     500 mg100 mL/hr over 60 Minutes Intravenous Every 8 hours 11/02/14 0023     11/01/14 2300  ciprofloxacin (  CIPRO) IVPB 400 mg     400 mg200 mL/hr over 60 Minutes Intravenous  Once 11/01/14 2251 11/02/14 0015   11/01/14 2300  metroNIDAZOLE (FLAGYL) IVPB 500 mg     500 mg100 mL/hr over 60 Minutes Intravenous  Once 11/01/14 2251 11/02/14 0122          Objective:   Filed Vitals:   11/02/14 0602 11/02/14 1328 11/02/14 2145 11/03/14 0514  BP:  122/70 117/76 112/68  Pulse:  76 67 64  Temp:  97.6 F (36.4 C) 97.9 F (36.6 C) 97.7 F (36.5 C)  TempSrc:  Oral Oral Oral  Resp:  16 18 16   Height:      Weight: 73.143 kg (161 lb 4 oz)       SpO2:  98% 99% 99%    Wt Readings from Last 3 Encounters:  11/02/14 73.143 kg (161 lb 4 oz)  10/29/14 72.485 kg (159 lb 12.8 oz)  10/21/14 73.029 kg (161 lb) (88 %*, Z = 1.15)   * Growth percentiles are based on CDC 2-20 Years data.     Intake/Output Summary (Last 24 hours) at 11/03/14 1331 Last data filed at 11/03/14 0555  Gross per 24 hour  Intake   2687 ml  Output    100 ml  Net   2587 ml     Physical Exam  Awake Alert, Oriented X 3, No new F.N deficits, Normal affect [place].AT,PERRAL Supple Neck,No JVD, No cervical lymphadenopathy appriciated.  Symmetrical Chest wall movement, Good air movement bilaterally, CTAB RRR,No Gallops,Rubs or new Murmurs, No Parasternal Heave +ve B.Sounds, Abd Soft, No tenderness, No organomegaly appriciated, No rebound - guarding or rigidity.Has iliostomy. No Cyanosis, Clubbing or edema, No new Rash or bruise    Data Review   Micro Results No results found for this or any previous visit (from the past 240 hour(s)).  Radiology Reports Ct Abdomen Pelvis W Contrast  11/01/2014   CLINICAL DATA:  Burning abdominal pain, bright red blood in bowel movements  EXAM: CT ABDOMEN AND PELVIS WITH CONTRAST  TECHNIQUE: Multidetector CT imaging of the abdomen and pelvis was performed using the standard protocol following bolus administration of intravenous contrast.  CONTRAST:  157m OMNIPAQUE IOHEXOL 300 MG/ML  SOLN  COMPARISON:  None.  FINDINGS: The lung bases are clear.  The liver demonstrates no focal abnormality. There is no intrahepatic or extrahepatic biliary ductal dilatation. The gallbladder is normal. The spleen demonstrates no focal abnormality. The kidneys, adrenal glands and pancreas are normal. The bladder is unremarkable.  There is no bowel dilatation. 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 There is ascending and transverse colon bowel wall thickening without significant  pericolonic inflammatory changes. The descending colon is difficult evaluate secondary to underdistention. There is mild bowel wall thickening involving the rectum which may reflect mild proctitis. There is a small fat containing umbilical hernia. There is no pneumoperitoneum, pneumatosis, or portal venous gas. There is no abdominal or pelvic free fluid. There is no lymphadenopathy.  The abdominal aorta is normal in caliber .  There are no lytic or sclerotic osseous lesions.  IMPRESSION: 1. Mild ascending and transverse colitis which may be secondary to an infectious etiologies versus Crohn's disease. There is mild bowel wall thickening involving the rectum which may reflect mild proctitis. 2. 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   Electronically Signed   By: HKathreen Devoid  On: 11/01/2014 22:38    CBC  Recent Labs Lab 10/31/14 1115 11/01/14 1853 11/02/14 0230 11/02/14 1420 11/03/14 0538  WBC 8.6 10.0 10.0  --  9.0  HGB 12.6 11.3* 10.4* 10.4* 10.0*  HCT 37.5 34.5* 31.7*  --  30.2*  PLT 279 300 252  --  279  MCV 87.6 88.2 89.5  --  88.6  MCH 29.4 28.9 29.4  --  29.3  MCHC 33.6 32.8 32.8  --  33.1  RDW 13.1 13.1 13.2  --  13.3  LYMPHSABS  --  2.9  --   --   --   MONOABS  --  1.0  --   --   --   EOSABS  --  0.1  --   --   --   BASOSABS  --  0.0  --   --   --     Chemistries   Recent Labs Lab 11/01/14 1853 11/02/14 0230 11/03/14 0538  NA 140 136* 141  K 3.4* 4.0 3.7  CL 104 104 107  CO2 21 19 23   GLUCOSE 97 102* 135*  BUN 6 4* 4*  CREATININE 0.58 0.50 0.43*  CALCIUM 8.9 8.5 8.4  AST 14 16  --   ALT 15 13  --   ALKPHOS 81 72  --   BILITOT <0.2* 0.2*  --    ------------------------------------------------------------------------------------------------------------------ estimated creatinine clearance is 114.8 mL/min (by C-G formula based on Cr of  0.43). ------------------------------------------------------------------------------------------------------------------ No results for input(s): HGBA1C in the last 72 hours. ------------------------------------------------------------------------------------------------------------------ No results for input(s): CHOL, HDL, LDLCALC, TRIG, CHOLHDL, LDLDIRECT in the last 72 hours. ------------------------------------------------------------------------------------------------------------------ No results for input(s): TSH, T4TOTAL, T3FREE, THYROIDAB in the last 72 hours.  Invalid input(s): FREET3 ------------------------------------------------------------------------------------------------------------------ No results for input(s): VITAMINB12, FOLATE, FERRITIN, TIBC, IRON, RETICCTPCT in the last 72 hours.  Coagulation profile  Recent Labs Lab 11/02/14 0230  INR 1.05    No results for input(s): DDIMER in the last 72 hours.  Cardiac Enzymes No results for input(s): CKMB, TROPONINI, MYOGLOBIN in the last 168 hours.  Invalid input(s): CK ------------------------------------------------------------------------------------------------------------------ Invalid input(s): POCBNP     Time Spent in minutes   25 minutes    Casimir Barcellos M.D on 11/03/2014 at 1:31 PM  Between 7am to 7pm - Pager - [phone]  After 7pm go to [email or web address] - password [email or web address]  And look for the night coverage person covering for me after hours  [facility] Office  [phone]   **Disclaimer: This note may have been dictated with voice recognition software. Similar sounding words can inadvertently be transcribed and this note may contain transcription errors which may not have been corrected upon publication of note.**

## 2014-11-04 ENCOUNTER — Encounter (HOSPITAL_COMMUNITY)
Admission: EM | Disposition: A | Discharge status: Home / Self Care | Payer: Self-pay | Source: Home / Self Care | Attending: Internal Medicine

## 2014-11-04 ENCOUNTER — Encounter (HOSPITAL_COMMUNITY): Payer: Self-pay

## 2014-11-04 HISTORY — PX: FLEXIBLE SIGMOIDOSCOPY: SHX5431

## 2014-11-04 LAB — CBC
HCT: 32.1 % — ABNORMAL LOW (ref 36.0–46.0)
Hemoglobin: 10.4 g/dL — ABNORMAL LOW (ref 12.0–15.0)
MCH: 28.5 pg (ref 26.0–34.0)
MCHC: 32.4 g/dL (ref 30.0–36.0)
MCV: 87.9 fL (ref 78.0–100.0)
Platelets: 277 10*3/uL (ref 150–400)
RBC: 3.65 MIL/uL — ABNORMAL LOW (ref 3.87–5.11)
RDW: 13.2 % (ref 11.5–15.5)
WBC: 7.4 10*3/uL (ref 4.0–10.5)

## 2014-11-04 LAB — BASIC METABOLIC PANEL
Anion gap: 12 (ref 5–15)
BUN: 4 mg/dL — ABNORMAL LOW (ref 6–23)
CO2: 21 mEq/L (ref 19–32)
Calcium: 8.3 mg/dL — ABNORMAL LOW (ref 8.4–10.5)
Chloride: 105 mEq/L (ref 96–112)
Creatinine, Ser: 0.45 mg/dL — ABNORMAL LOW (ref 0.50–1.10)
GFR calc Af Amer: >90 mL/min (ref >90)
GFR calc non Af Amer: >90 mL/min (ref >90)
Glucose, Bld: 125 mg/dL — ABNORMAL HIGH (ref 70–99)
Potassium: 3.7 mEq/L (ref 3.7–5.3)
Sodium: 138 mEq/L (ref 137–147)

## 2014-11-04 SURGERY — SIGMOIDOSCOPY, FLEXIBLE
Anesthesia: Moderate Sedation

## 2014-11-04 MED ORDER — SODIUM CHLORIDE 0.9 % IV SOLN
INTRAVENOUS | Status: DC
  Administered 2014-11-04: 500 mL via INTRAVENOUS

## 2014-11-04 MED ORDER — FENTANYL CITRATE 0.05 MG/ML IJ SOLN
INTRAMUSCULAR | Status: AC
  Filled 2014-11-04: qty 2

## 2014-11-04 MED ORDER — FENTANYL CITRATE 0.05 MG/ML IJ SOLN
INTRAMUSCULAR | Status: DC | PRN
  Administered 2014-11-04 (×4): 25 ug via INTRAVENOUS

## 2014-11-04 MED ORDER — MIDAZOLAM HCL 10 MG/2ML IJ SOLN
INTRAMUSCULAR | Status: DC | PRN
  Administered 2014-11-04: 2 mg via INTRAVENOUS
  Administered 2014-11-04: 1 mg via INTRAVENOUS
  Administered 2014-11-04: 2 mg via INTRAVENOUS

## 2014-11-04 MED ORDER — MIDAZOLAM HCL 5 MG/ML IJ SOLN
INTRAMUSCULAR | Status: AC
  Filled 2014-11-04: qty 2

## 2014-11-04 NOTE — Op Note (Signed)
[facility] [place], [address]   FLEXIBLE SIGMOIDOSCOPY PROCEDURE REPORT  PATIENT: Jessica [name], Jessica [name]  MR#: [id number] BIRTHDATE: December 03, 1996 , 18  yrs. old GENDER: female ENDOSCOPIST: [name], MD REFERRED BY: Dr E.Elgergawy PROCEDURE DATE:  11/04/2014 PROCEDURE:   Sigmoidoscopy with biopsy ASA CLASS:   Class II INDICATIONS:Crohn's disease of the small bowel ALT 18 years duration.  Status post ileostomy.  Now rectal bleeding from excluded colon segment, abdominal pain, plans for surgical reversa at [facility] interrupted due to lack of insurance. ( 3 years ago) MEDICATIONS: Fentanyl 100 mcg IV and Versed 5 mg IV  DESCRIPTION OF PROCEDURE:   After the risks benefits and alternatives of the procedure were thoroughly explained, informed consent was obtained.  Digital exam revealed no abnormalities of the rectum. The     endoscope was introduced through the anus  and advanced to the proximal transverse colon , The exam was Without limitations.    The quality of the prep was The overall prep quality was good. .  The instrument was then slowly withdrawn as the mucosa was fully examined.       Rectum: rectal sphincter and rectal ampulla were normal.  Colon: Starting at  10 cm from the rectum. mucosa became edematous, granular with multiple surface erosions and friability. There were no discrete ulcerations and there were no pseudopolyps. The acute colitis extended in a cofluent  manner throughout the sigmoid colon, descending colon, splenic flexure and transverse colon. At 90 cm in distal transverse colonoscope did not  attempt to advance further and colonoscope was retracted . Multiple random biopsies were obtained from the mucosa ,there were no pseudopolyps or deep ulcerations. The changes were more suggestive of diversion colitis than  Crohn's colitis         Retroflexion was not performed due to a narrow rectal vault.    The scope was  then withdrawn from the patient and the procedure terminated.  COMPLICATIONS: There were no immediate complications.  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

## 2014-11-04 NOTE — Plan of Care (Signed)
Problem: Phase I Progression Outcomes Goal: OOB as tolerated unless otherwise ordered Outcome: Completed/Met Date Met:  11/04/14 Goal: Voiding-avoid urinary catheter unless indicated Outcome: Completed/Met Date Met:  11/04/14 Goal: Hemodynamically stable Outcome: Completed/Met Date Met:  11/04/14  Problem: Phase II Progression Outcomes Goal: Progress activity as tolerated unless otherwise ordered Outcome: Completed/Met Date Met:  11/04/14 Goal: Vital signs remain stable Outcome: Completed/Met Date Met:  11/04/14 Goal: Obtain order to discontinue catheter if appropriate Outcome: Not Applicable Date Met:  11/04/14     

## 2014-11-04 NOTE — Progress Notes (Signed)
Patient Demographics  Jessica [name], is a 18 y.o. female, DOB - 02/16/1996, RAQ:[id number]  Admit date - 11/01/2014   Admitting Physician Berle Mull, MD  Outpatient Primary MD for the patient is No PCP Per Patient  LOS - 3   Chief Complaint  Patient presents with  . Abdominal Pain     Admission history of present illness/brief narrative: This is a 18 year old female with a PMH of Crohn's disease diagnosed at the age of 64, s/p ileostomy for a Crohn's fistula, and s/p D&C for a missed abortion on 10/31/2014 admitted for hematochezia. She was treated at [facility] until 2012, but she stopped going when her mother lost her insurance. The patient was also supposed to have a reanastamosis of her intestines. Per her report, she only has small bowel Crohn's disease. In addition to prednisone, she was treated with budesonide. During her initial presentation for Crohn's disease she had constipation, no reports of hematochezia. The hematochezia only started after her ileostomy, but even then, it was a couple of years later. Shunt was started empirically on IV Cipro, Flagyl, Solu-Medrol for her Crohn's flare, patient was seen by gastroenterology, were it felt is more likely that diversion colitis causing her hematochezia.she was kept on clear liquid diet, started on Rowasa enemas , and is scheduled for sigmoidoscopy on 11/05/15.   Subjective:   [name] today has, No headache, No chest pain,  - No Nausea, No new weakness tingling or numbness, No Cough - SOB.The still complains of mild abdominal pain.  Assessment & Plan    Principal Problem:   CC (Crohn's colitis) Active Problems:   Ileostomy in place   Missed abortion   Rectal bleeding  Hematochezia/abdominal pain: - Acute Crohn's exacerbation versus diversion colitis,  - gastroenterology consult is greatly  appreciated,plan is for sigmoidoscopy in a.m.11/04/14 . - continue with IV Flagyl, stop IV ciprofloxacin (11/02/14 - 11/03/14) started on Rowasa enemas twice a day 11/05/15. -continue with clear liquid diet  Code Status: Full  Family Communication: patient is alert and oriented  Disposition Plan: remains inpatient   Procedures  Plan is for sigmoidoscopy on 11/05/15.   Consults   Gastroenterology   Medications  Scheduled Meds: . [facility] Hold] antiseptic oral rinse  7 mL Mouth Rinse q12n4p  . [MAR Hold] chlorhexidine  15 mL Mouth Rinse BID  . [MAR Hold] mesalamine  4 g Rectal BID  . [MAR Hold] methylPREDNISolone (SOLU-MEDROL) injection  60 mg Intravenous Q24H  . [MAR Hold] metronidazole  500 mg Intravenous Q8H  . [MAR Hold] pantoprazole (PROTONIX) IV  40 mg Intravenous Q12H   Continuous Infusions: . sodium chloride 100 mL/hr at 11/04/14 0539  . sodium chloride 500 mL (11/04/14 1421)   PRN Meds:.[MAR Hold] acetaminophen **OR** [MAR Hold] acetaminophen, [MAR Hold]  morphine injection, [MAR Hold] ondansetron **OR** [MAR Hold] ondansetron (ZOFRAN) IV, [MAR Hold] oxyCODONE-acetaminophen  DVT Prophylaxis SCDs  Lab Results  Component Value Date   PLT 277 11/04/2014    Antibiotics    Anti-infectives    Start     Dose/Rate Route Frequency Ordered Stop   11/02/14 1000  ciprofloxacin (CIPRO) IVPB 400 mg  Status:  Discontinued     400 mg200 mL/hr over 60 Minutes Intravenous Every  12 hours 11/02/14 0024 11/03/14 1013   11/02/14 0030  [MAR Hold]  metroNIDAZOLE (FLAGYL) IVPB 500 mg     (MAR Hold since 11/04/14 1352)   500 mg100 mL/hr over 60 Minutes Intravenous Every 8 hours 11/02/14 0023     11/01/14 2300  ciprofloxacin (CIPRO) IVPB 400 mg     400 mg200 mL/hr over 60 Minutes Intravenous  Once 11/01/14 2251 11/02/14 0015   11/01/14 2300  metroNIDAZOLE (FLAGYL) IVPB 500 mg     500 mg100 mL/hr over 60 Minutes Intravenous  Once 11/01/14 2251 11/02/14 0122          Objective:    Filed Vitals:   11/04/14 0528 11/04/14 1355 11/04/14 1516 11/04/14 1518  BP: 122/68 145/92    Pulse: 57 54  61  Temp: 97.3 F (36.3 C) 98.3 F (36.8 C)    TempSrc: Oral Oral    Resp: 16 14  17   Height:      Weight: 76.749 kg (169 lb 3.2 oz)     SpO2: 100% 99% 100% 100%    Wt Readings from Last 3 Encounters:  11/04/14 76.749 kg (169 lb 3.2 oz)  10/29/14 72.485 kg (159 lb 12.8 oz)  10/21/14 73.029 kg (161 lb) (88 %*, Z = 1.15)   * Growth percentiles are based on CDC 2-20 Years data.     Intake/Output Summary (Last 24 hours) at 11/04/14 1527 Last data filed at 11/04/14 0600  Gross per 24 hour  Intake   2260 ml  Output      0 ml  Net   2260 ml     Physical Exam  Awake Alert, Oriented X 3, No new F.N deficits, Normal affect [place].AT,PERRAL Supple Neck,No JVD, No cervical lymphadenopathy appriciated.  Symmetrical Chest wall movement, Good air movement bilaterally, CTAB RRR,No Gallops,Rubs or new Murmurs, No Parasternal Heave +ve B.Sounds, Abd Soft, No tenderness, No organomegaly appriciated, No rebound - guarding or rigidity.Has iliostomy. No Cyanosis, Clubbing or edema, No new Rash or bruise    Data Review   Micro Results No results found for this or any previous visit (from the past 240 hour(s)).  Radiology Reports No results found.  CBC  Recent Labs Lab 10/31/14 1115 11/01/14 1853 11/02/14 0230 11/02/14 1420 11/03/14 0538 11/04/14 0458  WBC 8.6 10.0 10.0  --  9.0 7.4  HGB 12.6 11.3* 10.4* 10.4* 10.0* 10.4*  HCT 37.5 34.5* 31.7*  --  30.2* 32.1*  PLT 279 300 252  --  279 277  MCV 87.6 88.2 89.5  --  88.6 87.9  MCH 29.4 28.9 29.4  --  29.3 28.5  MCHC 33.6 32.8 32.8  --  33.1 32.4  RDW 13.1 13.1 13.2  --  13.3 13.2  LYMPHSABS  --  2.9  --   --   --   --   MONOABS  --  1.0  --   --   --   --   EOSABS  --  0.1  --   --   --   --   BASOSABS  --  0.0  --   --   --   --     Chemistries   Recent Labs Lab 11/01/14 1853 11/02/14 0230 11/03/14 0538  11/04/14 0458  NA 140 136* 141 138  K 3.4* 4.0 3.7 3.7  CL 104 104 107 105  CO2 21 19 23 21   GLUCOSE 97 102* 135* 125*  BUN 6 4* 4* 4*  CREATININE 0.58 0.50 0.43*  0.45*  CALCIUM 8.9 8.5 8.4 8.3*  AST 14 16  --   --   ALT 15 13  --   --   ALKPHOS 81 72  --   --   BILITOT <0.2* 0.2*  --   --    ------------------------------------------------------------------------------------------------------------------ estimated creatinine clearance is 117.4 mL/min (by C-G formula based on Cr of 0.45). ------------------------------------------------------------------------------------------------------------------ No results for input(s): HGBA1C in the last 72 hours. ------------------------------------------------------------------------------------------------------------------ No results for input(s): CHOL, HDL, LDLCALC, TRIG, CHOLHDL, LDLDIRECT in the last 72 hours. ------------------------------------------------------------------------------------------------------------------ No results for input(s): TSH, T4TOTAL, T3FREE, THYROIDAB in the last 72 hours.  Invalid input(s): FREET3 ------------------------------------------------------------------------------------------------------------------ No results for input(s): VITAMINB12, FOLATE, FERRITIN, TIBC, IRON, RETICCTPCT in the last 72 hours.  Coagulation profile  Recent Labs Lab 11/02/14 0230  INR 1.05    No results for input(s): DDIMER in the last 72 hours.  Cardiac Enzymes No results for input(s): CKMB, TROPONINI, MYOGLOBIN in the last 168 hours.  Invalid input(s): CK ------------------------------------------------------------------------------------------------------------------ Invalid input(s): POCBNP     Time Spent in minutes   25 minutes    Aleicia Kenagy M.D on 11/04/2014 at 3:27 PM  Between 7am to 7pm - Pager - [phone]  After 7pm go to [email or web address] - password [email or web address]  And look for the night coverage  person covering for me after hours  [facility] Office  [phone]   **Disclaimer: This note may have been dictated with voice recognition software. Similar sounding words can inadvertently be transcribed and this note may contain transcription errors which may not have been corrected upon publication of note.**

## 2014-11-05 ENCOUNTER — Encounter (HOSPITAL_COMMUNITY): Payer: Self-pay | Admitting: Internal Medicine

## 2014-11-05 DIAGNOSIS — K50911 Crohn's disease, unspecified, with rectal bleeding: Secondary | ICD-10-CM

## 2014-11-05 DIAGNOSIS — D649 Anemia, unspecified: Secondary | ICD-10-CM | POA: Insufficient documentation

## 2014-11-05 LAB — BASIC METABOLIC PANEL
Anion gap: 12 (ref 5–15)
BUN: 7 mg/dL (ref 6–23)
CO2: 21 mEq/L (ref 19–32)
Calcium: 8.3 mg/dL — ABNORMAL LOW (ref 8.4–10.5)
Chloride: 107 mEq/L (ref 96–112)
Creatinine, Ser: 0.41 mg/dL — ABNORMAL LOW (ref 0.50–1.10)
GFR calc Af Amer: >90 mL/min (ref >90)
GFR calc non Af Amer: >90 mL/min (ref >90)
Glucose, Bld: 121 mg/dL — ABNORMAL HIGH (ref 70–99)
Potassium: 4.4 mEq/L (ref 3.7–5.3)
Sodium: 140 mEq/L (ref 137–147)

## 2014-11-05 LAB — CBC
HCT: 32.7 % — ABNORMAL LOW (ref 36.0–46.0)
Hemoglobin: 10.5 g/dL — ABNORMAL LOW (ref 12.0–15.0)
MCH: 28.2 pg (ref 26.0–34.0)
MCHC: 32.1 g/dL (ref 30.0–36.0)
MCV: 87.7 fL (ref 78.0–100.0)
Platelets: 303 10*3/uL (ref 150–400)
RBC: 3.73 MIL/uL — ABNORMAL LOW (ref 3.87–5.11)
RDW: 13.1 % (ref 11.5–15.5)
WBC: 9.3 10*3/uL (ref 4.0–10.5)

## 2014-11-05 MED ORDER — FAMOTIDINE 40 MG/5ML PO SUSR
20.0000 mg | Freq: Two times a day (BID) | ORAL | Status: DC
  Administered 2014-11-05 – 2014-11-07 (×5): 20 mg via ORAL
  Filled 2014-11-05 (×7): qty 2.5

## 2014-11-05 NOTE — Plan of Care (Signed)
Problem: Phase I Progression Outcomes Goal: Pain controlled with appropriate interventions Outcome: Completed/Met Date Met:  11/05/14  Problem: Phase III Progression Outcomes Goal: Activity at appropriate level-compared to baseline (UP IN CHAIR FOR HEMODIALYSIS)  Outcome: Completed/Met Date Met:  11/05/14 Goal: Voiding independently Outcome: Completed/Met Date Met:  11/05/14 Goal: Foley discontinued Outcome: Not Applicable Date Met:  20/10/07

## 2014-11-05 NOTE — Progress Notes (Signed)
PROGRESS NOTE    Jessica [name] EYC:[id number] DOB: 10-03-1996 DOA: 11/01/2014 PCP: No PCP Per Patient  HPI/Brief narrative This is a 18 year old female with a PMH of Crohn's disease diagnosed at the age of 6, s/p ileostomy for a Crohn's fistula, and s/p D&C for a missed abortion on 10/31/2014 admitted for hematochezia. She was treated at [facility] until 2012, but she stopped going when her mother lost her insurance. The patient was also supposed to have a reanastamosis of her intestines. Per her report, she only has small bowel Crohn's disease. In addition to prednisone, she was treated with budesonide. During her initial presentation for Crohn's disease she had constipation, no reports of hematochezia. The hematochezia only started after her ileostomy, but even then, it was a couple of years later. 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   Assessment/Plan:  1. 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 Initial suspicion was for acute Crohn's exacerbation versus divertion colitis. 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 She is on ROWASA enemas. As per GI, she will eventually need reversal of ileostomy. Will follow GI recommendations. 2. Chronic anemia: Stable 3. History of Crohn's disease and diverticular ileostomy:   Code Status: full Family Communication: none at bedside Disposition Plan: home when medically stable   Consultants:  gastroenterology  Procedures:  Flexible sigmoidoscopy 11/17  Antibiotics:   IV Flagyl   Subjective:  patient feels slightly better. Decrease in her rectal bleeding. Ileostomy has some  stool output  Objective: Filed Vitals:   11/04/14 2119 11/04/14 2228 11/05/14 0526 11/05/14 1316  BP: 94/45 93/44 114/68 105/64  Pulse: 81 76 73 76  Temp: 97.5 F (36.4 C)  98.2 F (36.8 C) 98.1 F (36.7 C)  TempSrc: Oral  Oral Oral  Resp: 16  16 17   Height:      Weight:   75.297 kg (166 lb)   SpO2: 99%  99% 100%    Intake/Output Summary (Last 24 hours) at 11/05/14 1457 Last data filed at 11/05/14 1403  Gross per 24 hour  Intake   2500 ml  Output      0 ml  Net   2500 ml   Filed Weights   11/02/14 0602 11/04/14 0528 11/05/14 0526  Weight: 73.143 kg (161 lb 4 oz) 76.749 kg (169 lb 3.2 oz) 75.297 kg (166 lb)     Exam:  General exam: pleasant young female lying comfortably in bed Respiratory system: Clear. No increased work of breathing. Cardiovascular system: S1 & S2 heard, RRR. No JVD, murmurs, gallops, clicks or pedal edema. Gastrointestinal system: Abdomen is nondistended, soft. Mild tenderness in the left quadrants without rigidity, guarding or rebound. Normal bowel sounds heard. Central nervous system: Alert and oriented. No focal neurological deficits. Extremities: Symmetric 5 x 5 power.   Data Reviewed: Basic Metabolic Panel:  Recent Labs Lab 11/01/14 1853 11/02/14 0230 11/03/14 0538 11/04/14 0458 11/05/14 0520  NA 140 136* 141 138 140  K 3.4* 4.0 3.7 3.7 4.4  CL 104 104 107 105 107  CO2 21 19 23 21 21   GLUCOSE 97 102* 135* 125* 121*  BUN 6 4* 4* 4* 7  CREATININE 0.58 0.50 0.43* 0.45* 0.41*  CALCIUM  8.9 8.5 8.4 8.3* 8.3*   Liver Function Tests:  Recent Labs Lab 11/01/14 1853 11/02/14 0230  AST 14 16  ALT 15 13  ALKPHOS 81 72  BILITOT <0.2* 0.2*  PROT 7.6 7.0  ALBUMIN 3.1* 2.9*    Recent Labs Lab 11/01/14 1853  LIPASE 30   No results for input(s): AMMONIA in the last 168 hours. CBC:  Recent Labs Lab 11/01/14 1853 11/02/14 0230 11/02/14 1420 11/03/14 0538 11/04/14 0458 11/05/14 0520  WBC 10.0 10.0  --  9.0 7.4 9.3    NEUTROABS 6.0  --   --   --   --   --   HGB 11.3* 10.4* 10.4* 10.0* 10.4* 10.5*  HCT 34.5* 31.7*  --  30.2* 32.1* 32.7*  MCV 88.2 89.5  --  88.6 87.9 87.7  PLT 300 252  --  279 277 303   Cardiac Enzymes: No results for input(s): CKTOTAL, CKMB, CKMBINDEX, TROPONINI in the last 168 hours. BNP (last 3 results) No results for input(s): PROBNP in the last 8760 hours. CBG: No results for input(s): GLUCAP in the last 168 hours.  No results found for this or any previous visit (from the past 240 hour(s)).     Studies: No results found.      Scheduled Meds: . antiseptic oral rinse  7 mL Mouth Rinse q12n4p  . chlorhexidine  15 mL Mouth Rinse BID  . famotidine  20 mg Oral BID  . mesalamine  4 g Rectal BID  . methylPREDNISolone (SOLU-MEDROL) injection  60 mg Intravenous Q24H  . metronidazole  500 mg Intravenous Q8H   Continuous Infusions: . sodium chloride 100 mL/hr at 11/05/14 1304    Principal Problem:   CC (Crohn's colitis) Active Problems:   Ileostomy in place   Missed abortion   Rectal bleeding    Time spent: 73 minutes    Joniqua Sidle, MD, FACP, FHM. [facility] Pager [phone]  If 7PM-7AM, please contact night-coverage [email or web address] Password [email or web address] 11/05/2014, 2:57 PM    LOS: 4 days

## 2014-11-05 NOTE — Progress Notes (Addendum)
PATIENT PLACED ON ENTERIC PRECAUTIONS PER POSITIVE NOROVIRUS IN GI PANEL RESULTS, PER INFECTIOUS DISEASE

## 2014-11-05 NOTE — Progress Notes (Signed)
Patient ID: Jessica [name], female   DOB: 1996-09-02, 18 y.o.   MRN: [id number] Dr. Olevia Perches called and spoke to Dr. [name] as well as myself about this patient.  She underwent a colon resection and end ileostomy 4 years ago an [facility].  She has been unable to have this reversed for a number of reasons since then.  She has been admitted here for blood per rectum.  She was scoped and she was found to have what may be diversion colitis.  Biopsies are pending.  She has been placed on high dose steroids for this colitis.  I have spoken to Dr. Marcello Moores who is happy to see the patient in the office to discuss reversal of her ileostomy, pending biopsy results.  I have discussed this with the patient as well.  Our office will obtain her operative records from [facility] so we have that information.  Our office will also call with her appointment time.  Dr. Marcello Moores has recommended olive oil enemas as needed if her pathology comes back as diversion colitis as this seems to help with the inflammation.  This all has been discussed with the patient and she is agreeable with this plan.  Jeanean Hollett E 3:13 PM 11/05/2014

## 2014-11-05 NOTE — Progress Notes (Signed)
   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 She is on ROWASA enemas whish she retains only for few minutes, I asked to try to retain them longer. She will eventually need  reversal of ileostomy but prefers to have it done in [place] ( she was on Pediatric Service in [place]). Will consider surgical consult.  Vital signs in last 24 hours: Temp:  [97.5 F (36.4 C)-98.3 F (36.8 C)] 98.2 F (36.8 C) (11/18 0526) Pulse Rate:  [54-81] 73 (11/18 0526) Resp:  [12-26] 16 (11/18 0526) BP: (93-180)/(44-129) 114/68 mmHg (11/18 0526) SpO2:  [99 %-100 %] 99 % (11/18 0526) Weight:  [166 lb (75.297 kg)] 166 lb (75.297 kg) (11/18 0526) Last BM Date: 11/04/14 General:    white female in NAD Heart:  Regular rate and rhythm; no murmurs Lungs: Respirations even and unlabored, lungs CTA bilaterally Abdomen:  Soft, nontender and nondistended. Normal bowel sounds. Extremities:  Without edema. Neurologic:  Alert and oriented,  grossly normal neurologically. Psych:  Cooperative. Normal mood and affect.  Intake/Output from previous day: 11/17 0701 - 11/18 0700 In: 1780 [P.O.:480; I.V.:1200; IV Piggyback:100] Out: -  Intake/Output this shift: Total I/O In: 360 [P.O.:360] Out: -   Lab Results:  Recent Labs  11/03/14 0538 11/04/14 0458 11/05/14 0520  WBC 9.0 7.4 9.3  HGB 10.0* 10.4* 10.5*  HCT 30.2* 32.1* 32.7*  PLT 279 277 303   BMET  Recent Labs  11/03/14 0538 11/04/14 0458 11/05/14 0520  NA 141 138 140  K 3.7 3.7 4.4  CL 107 105 107  CO2 23 21 21   GLUCOSE 135* 125* 121*  BUN 4* 4* 7  CREATININE 0.43* 0.45* 0.41*  CALCIUM 8.4 8.3* 8.3*   LFT No results for input(s): PROT, ALBUMIN, AST, ALT, ALKPHOS, BILITOT, BILIDIR, IBILI in the last 72 hours. PT/INR No results for input(s): LABPROT, INR in the last 72  hours.  Studies/Results: No results found.     Assessment / Plan:   Crohn's disease,  Diversion colitis On steroids, continue for now Awaiting confirmatory biopsy results, then decide about surgical consult.inpatient vs outpatient  Principal Problem:   CC (Crohn's colitis) Active Problems:   Ileostomy in place   Missed abortion   Rectal bleeding     LOS: 4 days   Delfin Edis  11/05/2014, 12:02 PM

## 2014-11-06 ENCOUNTER — Encounter: Payer: Self-pay | Admitting: Nurse Practitioner

## 2014-11-06 ENCOUNTER — Ambulatory Visit: Payer: Self-pay | Admitting: Obstetrics & Gynecology

## 2014-11-06 DIAGNOSIS — Z932 Ileostomy status: Secondary | ICD-10-CM

## 2014-11-06 NOTE — Progress Notes (Signed)
PROGRESS NOTE    Jessica [name] OIZ:[id number] DOB: Jan 10, 1996 DOA: 11/01/2014 PCP: No PCP Per Patient  HPI/Brief narrative This is a 18 year old female with a PMH of Crohn's disease diagnosed at the age of 1, s/p ileostomy for a Crohn's fistula, and s/p D&C for a missed abortion on 10/31/2014 admitted for hematochezia. She was treated at [facility] until 2012, but she stopped going when her mother lost her insurance. The patient was also supposed to have a reanastamosis of her intestines. Per her report, she only has small bowel Crohn's disease. In addition to prednisone, she was treated with budesonide. During her initial presentation for Crohn's disease she had constipation, no reports of hematochezia. The hematochezia only started after her ileostomy, but even then, it was a couple of years later. 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   Assessment/Plan:  1. 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 Initial suspicion was for acute Crohn's exacerbation versus divertion colitis. 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 She is on ROWASA enemas & IV Solumedrol. As per GI, she will eventually need reversal of ileostomy. 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 We will await further recommendations by GI regarding management and discharge plans. 2. Chronic anemia: Stable 3. History of Crohn's disease and diverticular ileostomy: General surgery has arranged outpatient appointment for follow-up to plan for reversal of file ileostomy. 4. Positive Norovirus: Seen on GI  pathogen panel PCR. Clinical picture not consistent with this. Anyway treated supportively. Overall improving. Continue contact isolation.   Code Status: full Family Communication: Discussed with fianc at bedside. Disposition Plan: home possibly in a.m.   Consultants:  Gastroenterology  Procedures:  Flexible sigmoidoscopy 11/17  Antibiotics:   IV Flagyl   Subjective: Patient states that she has decreased rectal bleeding which is now dark and not bright red blood and is admixed with mucus. Still has mostly left-sided abdominal pain. No nausea or vomiting. Not much appetite but eating.  Objective: Filed Vitals:   11/05/14 1316 11/05/14 2213 11/06/14 0613 11/06/14 1016  BP: 105/64 112/64 108/53 124/72  Pulse: 76 99 55 73  Temp: 98.1 F (36.7 C) 98 F (36.7 C) 98.2 F (36.8 C) 97.5 F (36.4 C)  TempSrc: Oral Oral Oral Oral  Resp: 17 16 16 16   Height:      Weight:   75.07 kg (165 lb 8 oz)   SpO2: 100% 100% 98% 100%    Intake/Output Summary (Last 24 hours) at 11/06/14 1818 Last data filed at 11/06/14 1322  Gross per 24 hour  Intake   2102 ml  Output      0 ml  Net   2102 ml   Filed Weights   11/04/14 0528 11/05/14 0526 11/06/14 0613  Weight: 76.749 kg (169 lb 3.2 oz) 75.297 kg (166 lb) 75.07 kg (165 lb 8 oz)     Exam:  General exam: pleasant young female lying comfortably in bed Respiratory system: Clear. No increased work of breathing. Cardiovascular system: S1 & S2 heard, RRR. No JVD, murmurs, gallops, clicks or pedal edema. Gastrointestinal system: Abdomen is nondistended, soft. Mild tenderness (less today) in the left quadrants  without rigidity, guarding or rebound. Normal bowel sounds heard. Central nervous system: Alert and oriented. No focal neurological deficits. Extremities: Symmetric 5 x 5 power.   Data Reviewed: Basic Metabolic Panel:  Recent Labs Lab 11/01/14 1853 11/02/14 0230 11/03/14 0538 11/04/14 0458 11/05/14 0520  NA 140 136* 141  138 140  K 3.4* 4.0 3.7 3.7 4.4  CL 104 104 107 105 107  CO2 21 19 23 21 21   GLUCOSE 97 102* 135* 125* 121*  BUN 6 4* 4* 4* 7  CREATININE 0.58 0.50 0.43* 0.45* 0.41*  CALCIUM 8.9 8.5 8.4 8.3* 8.3*   Liver Function Tests:  Recent Labs Lab 11/01/14 1853 11/02/14 0230  AST 14 16  ALT 15 13  ALKPHOS 81 72  BILITOT <0.2* 0.2*  PROT 7.6 7.0  ALBUMIN 3.1* 2.9*    Recent Labs Lab 11/01/14 1853  LIPASE 30   No results for input(s): AMMONIA in the last 168 hours. CBC:  Recent Labs Lab 11/01/14 1853 11/02/14 0230 11/02/14 1420 11/03/14 0538 11/04/14 0458 11/05/14 0520  WBC 10.0 10.0  --  9.0 7.4 9.3  NEUTROABS 6.0  --   --   --   --   --   HGB 11.3* 10.4* 10.4* 10.0* 10.4* 10.5*  HCT 34.5* 31.7*  --  30.2* 32.1* 32.7*  MCV 88.2 89.5  --  88.6 87.9 87.7  PLT 300 252  --  279 277 303   Cardiac Enzymes: No results for input(s): CKTOTAL, CKMB, CKMBINDEX, TROPONINI in the last 168 hours. BNP (last 3 results) No results for input(s): PROBNP in the last 8760 hours. CBG: No results for input(s): GLUCAP in the last 168 hours.  No results found for this or any previous visit (from the past 240 hour(s)).     Studies: No results found.      Scheduled Meds: . antiseptic oral rinse  7 mL Mouth Rinse q12n4p  . chlorhexidine  15 mL Mouth Rinse BID  . famotidine  20 mg Oral BID  . mesalamine  4 g Rectal BID  . methylPREDNISolone (SOLU-MEDROL) injection  60 mg Intravenous Q24H  . metronidazole  500 mg Intravenous Q8H   Continuous Infusions: . sodium chloride 100 mL/hr at 11/06/14 1141    Principal Problem:   CC (Crohn's colitis) Active Problems:   Ileostomy in place   Missed abortion   Rectal bleeding   Absolute anemia    Time spent: 57 minutes    Jessica Dunlow, MD, FACP, FHM. [facility] Pager [phone]  If 7PM-7AM, please contact night-coverage [email or web address] Password [email or web address] 11/06/2014, 6:18 PM    LOS: 5 days

## 2014-11-06 NOTE — Progress Notes (Signed)
Progress Note   Subjective  still has lower abdominal pain, othewise okay   Objective   Vital signs in last 24 hours: Temp:  [97.5 F (36.4 C)-98.2 F (36.8 C)] 97.5 F (36.4 C) (11/19 1016) Pulse Rate:  [55-99] 73 (11/19 1016) Resp:  [16-17] 16 (11/19 1016) BP: (105-124)/(53-72) 124/72 mmHg (11/19 1016) SpO2:  [98 %-100 %] 100 % (11/19 1016) Weight:  [165 lb 8 oz (75.07 kg)] 165 lb 8 oz (75.07 kg) (11/19 8119) Last BM Date: 11/05/14 General:    white female in NAD Heart:  Regular rate and rhythm; no murmurs Lungs: Respirations even and unlabored, lungs CTA bilaterally Abdomen:  Soft, nontender and nondistended. Normal bowel sounds. Extremities:  Without edema. Neurologic:  Alert and oriented,  grossly normal neurologically. Psych:  Cooperative. Normal mood and affect.    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 Crohn's, s/p ileostomy. She did not have reversal, mother lost insurance. She hasn't been on IBD meds for 3 years now.  2.  Abdominal pain, bloody, mucoid rectal discharge and colitis on CTscan. he has been on flagyl, rowasa enemas and solumedrol. Not entirely clear whether this is active IBD or diversion colitis though sigmoidoscopy findings suspicious  for diversion colitis. Awaiting biopsies. Patient stable for discharge. Short chain FFA treatment of choice but not sure insurance would pay for that or Rowasa. At discharge recommend:  Hydrocortisone retention enemas BID (continue until seen by [place] 12/1)  Stop oral steroids and flagyl.   She is to see colorectal surgeon (Dr. Marcello Moores) on 12/14 for consideration of ileostomy reversal.  Trial of Bentyl 33m BID for abdominal discomfort.  Low fiber diet for now (given colitis on CTscan)  She [place] with [place]. Patient knows to call in the interim for problems/questions  3. Positive norovirus which usually affects small bowel. She really wasn't having increased ostomy output. No small bowel inflammation on CTscan. Usually self limiting infection.     LOS: 5 days   PTye Savoy 11/06/2014, 10:42 AM  Attending MD note:   I have taken a history, examined the patient, we still don't have a conclusive diagnosis. The colon biopsies were signed out as IBD but I have discussed the clinical wituation with Dr HAvis Epleywho  will review the biopsies  Again with Dr RDonato HeinzIn support of diversion colitis is the fact that she has not improved significantly on IV steroids since she has been here, Also, historically, her Crohn's disease has been predominantly in the small bowl . But her ileostomy functions well and CT scan does not suggest small bowl disease. We will discharge patient tomorrow and  epending on the pathologist impression, will D/C steroids or continue them  and start biologicals.I have dicussed this with the pt and her mother.  Melburn Popper [facility] Pager # [phone]

## 2014-11-07 DIAGNOSIS — K529 Noninfective gastroenteritis and colitis, unspecified: Secondary | ICD-10-CM

## 2014-11-07 LAB — CBC
HCT: 34.4 % — ABNORMAL LOW (ref 36.0–46.0)
Hemoglobin: 11.1 g/dL — ABNORMAL LOW (ref 12.0–15.0)
MCH: 29.1 pg (ref 26.0–34.0)
MCHC: 32.3 g/dL (ref 30.0–36.0)
MCV: 90.1 fL (ref 78.0–100.0)
Platelets: 342 10*3/uL (ref 150–400)
RBC: 3.82 MIL/uL — ABNORMAL LOW (ref 3.87–5.11)
RDW: 13.4 % (ref 11.5–15.5)
WBC: 13.3 10*3/uL — ABNORMAL HIGH (ref 4.0–10.5)

## 2014-11-07 MED ORDER — DICYCLOMINE HCL 10 MG PO CAPS: 10.0000 mg | ORAL_CAPSULE | Freq: Two times a day (BID) | ORAL | Status: DC

## 2014-11-07 MED ORDER — ACETAMINOPHEN 500 MG PO TABS: 500.0000 mg | ORAL_TABLET | Freq: Two times a day (BID) | ORAL | Status: DC | PRN

## 2014-11-07 MED ORDER — HYDROCORTISONE 100 MG/60ML RE ENEM: 1.0000 | ENEMA | Freq: Two times a day (BID) | RECTAL | Status: DC

## 2014-11-07 NOTE — Progress Notes (Signed)
   Subjective  Does not feel well,having a headache   Objective  Afebrile, awaiting decision about colon biopsies, Positive Rotavirus of questionable significance, tolerating regular diet Vital signs in last 24 hours: Temp:  [97.5 F (36.4 C)-98 F (36.7 C)] 97.9 F (36.6 C) (11/20 0604) Pulse Rate:  [61-78] 61 (11/20 0604) Resp:  [16-17] 17 (11/20 0604) BP: (104-124)/(57-72) 104/57 mmHg (11/20 0604) SpO2:  [99 %-100 %] 99 % (11/20 0604) Weight:  [169 lb 12.8 oz (77.021 kg)] 169 lb 12.8 oz (77.021 kg) (11/20 0604) Last BM Date: 11/06/14 General:  AA female in NAD Heart:  Regular rate and rhythm; no murmurs Lungs: Respirations even and unlabored, lungs CTA bilaterally Abdomen:  Soft, tender . nondistended. Normal bowel sounds. Ileostomy appliance right middle quadrant. Diffuse reducible  peristomalhernia Extremities:  Without edema. Neurologic:  Alert and oriented,  grossly normal neurologically. Psych:  Cooperative. Normal mood and affect.  Intake/Output from previous day: 11/19 0701 - 11/20 0700 In: 1280 [P.O.:1280] Out: 1 [Stool:1] Intake/Output this shift:    Lab Results:  Recent Labs  11/05/14 0520 11/07/14 0551  WBC 9.3 13.3*  HGB 10.5* 11.1*  HCT 32.7* 34.4*  PLT 303 342   BMET  Recent Labs  11/05/14 0520  NA 140  K 4.4  CL 107  CO2 21  GLUCOSE 121*  BUN 7  CREATININE 0.41*  CALCIUM 8.3*   LFT No results for input(s): PROT, ALBUMIN, AST, ALT, ALKPHOS, BILITOT, BILIDIR, IBILI in the last 72 hours. PT/INR No results for input(s): LABPROT, INR in the last 72 hours.  Studies/Results: No results found.     Assessment / Plan:   We have received conclusive pathology report on colon biopsies which are consistent with diversion colitis. I have personally talked to Dr Avis Epley, who also consulted with Dr Donato Heinz.  I have discussed plans with the patient which include stopping steroids,  pain control with Vicodin or tramadol Discharge today appointment  with Dr. Marcello Moores December 14  appointment with me or extender in 1-2 weeks Continue steroid enemas hs Probiotics daily Bentyl 10 mg po tid ac     Principal Problem:   CC (Crohn's colitis) Active Problems:   Ileostomy in place   Missed abortion   Rectal bleeding   Absolute anemia     LOS: 6 days   Delfin Edis  11/07/2014, 9:48 AM

## 2014-11-07 NOTE — Discharge Summary (Signed)
Physician Discharge Summary  Jessica [name] ZMO:[id number] DOB: Feb 05, 1996 DOA: 11/01/2014  PCP: No PCP Per Patient  Admit date: 11/01/2014 Discharge date: 11/07/2014  Time spent: Less than 30 minutes  Recommendations for Outpatient Follow-up:  1. Dr. [name], General Surgery: MDs office will call patient with appointment. 2. Ms. Tye Savoy, GI PA on 11/18/14 at 10:30 AM.  Discharge Diagnoses:  Principal Problem:   CC (Crohn's colitis) Active Problems:   Ileostomy in place   Missed abortion   Rectal bleeding   Absolute anemia   Discharge Condition: Improved & Stable  Diet recommendation: Low fiber diet  Filed Weights   11/05/14 0526 11/06/14 0613 11/07/14 0604  Weight: 75.297 kg (166 lb) 75.07 kg (165 lb 8 oz) 77.021 kg (169 lb 12.8 oz)    History of present illness:  This is a 18 year old female with a PMH of Crohn's disease diagnosed at the age of 61, s/p ileostomy for a Crohn's fistula, and s/p D&C for a missed abortion on 10/31/2014 admitted for hematochezia. She was treated at [facility] until 2012, but she stopped going when her mother lost her insurance. The patient was also supposed to have a reanastamosis of her intestines. Per her report, she only has small bowel Crohn's disease. In addition to prednisone, she was treated with budesonide. During her initial presentation for Crohn's disease she had constipation, no reports of hematochezia. The hematochezia only started after her ileostomy, but even then, it was a couple of years later. 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 Initial suspicion was for acute Crohn's  exacerbation versus divertion colitis. Patient underwent sigmoidoscopy to transverse colon on 11/17. She was on ROWASA enemas, IV Solumedrol & IV Flagyl. As per GI, she will eventually need reversal of ileostomy. 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 GI has seen her today and cleared her for discharge home on following regimen: Hydrocortisone retention enemas twice a day until seen by them on 12/1, Bentyl 10 MG twice a day and low fiber diet. They recommend stopping steroids and Flagyl. Patient has an appointment to see colorectal surgeon on 12/01/14 for consideration of ileostomy reversal. Patient states that she has sufficient pain medications at home. She has been advised to contact the gastroenterologist for any concerns or problems.  Chronic anemia: Stable  History of Crohn's disease and diverticular ileostomy: General surgery has arranged outpatient appointment for follow-up to plan for reversal of file ileostomy.  Positive Norovirus: Seen on GI pathogen panel PCR. Clinical picture not consistent with this. Anyway treated supportively. Overall improving. Continue contact isolation.  Status post D&C for missed abortion on 10/31/14  Consultants:  Gastroenterology  Procedures:  Flexible sigmoidoscopy 11/17   Discharge Exam:  Complaints: Mild headache this morning. No nausea or vomiting. Tolerating small amount of diet. States that the rectal bleeding has significantly improved and only has minimal dark bloody mucus only during enemas in the hospital. Continues to have mild-to-moderate mostly left-sided abdominal pain.  Filed Vitals:   11/06/14 0354 11/06/14 1016 11/06/14 2232 11/07/14 0604  BP: 108/53 124/72 117/71 104/57  Pulse: 55 73 78 61  Temp: 98.2  F (36.8 C) 97.5 F (36.4 C) 98 F (36.7 C) 97.9 F (36.6 C)  TempSrc: Oral Oral Oral Oral  Resp: 16 16 17 17   Height:      Weight: 75.07 kg (165 lb 8 oz)    77.021 kg (169 lb 12.8 oz)  SpO2: 98% 100% 100% 99%    General exam: pleasant young female lying comfortably in bed Respiratory system: Clear. No increased work of breathing. Cardiovascular system: S1 & S2 heard, RRR. No JVD, murmurs, gallops, clicks or pedal edema. Gastrointestinal system: Abdomen is nondistended, soft. Mild tenderness (less today) in the left quadrants without rigidity, guarding or rebound. Normal bowel sounds heard. Central nervous system: Alert and oriented. No focal neurological deficits. Extremities: Symmetric 5 x 5 power.  Discharge Instructions      Discharge Instructions    Call MD for:  persistant nausea and vomiting    Complete by:  As directed      Call MD for:  severe uncontrolled pain    Complete by:  As directed      Call MD for:  temperature >100.4    Complete by:  As directed      Call MD for:    Complete by:  As directed   Worsening rectal bleeding.     Discharge instructions    Complete by:  As directed   DIET: Low fiber diet.     Increase activity slowly    Complete by:  As directed             Medication List    TAKE these medications        acetaminophen 500 MG tablet  Commonly known as:  TYLENOL  Take 1 tablet (500 mg total) by mouth every 12 (twelve) hours as needed for mild pain.     dicyclomine 10 MG capsule  Commonly known as:  BENTYL  Take 1 capsule (10 mg total) by mouth 2 (two) times daily.     hydrocortisone 100 MG/60ML enema  Commonly known as:  CORTENEMA  Place 1 enema (100 mg total) rectally 2 (two) times daily.     norethindrone-ethinyl estradiol 1-20 MG-MCG tablet  Commonly known as:  MICROGESTIN,JUNEL,LOESTRIN  Take 1 tablet by mouth daily. Start in 3 weeks     OB COMPLETE PETITE 35-5-1-200 MG Caps  Take 1 capsule by mouth daily.     oxyCODONE-acetaminophen 5-325 MG per tablet  Commonly known as:  PERCOCET  Take 2 tablets by mouth every 6 (six) hours as needed for severe pain.     PATADAY 0.2 % Soln   Generic drug:  Olopatadine HCl  Place 1 drop into both eyes every other day.       Follow-up Information    Follow up with Rosario Adie., MD.   Specialty:  General Surgery   Why:  our office will call you with appointment   Contact information:   [place] [place] [address] [phone]       Follow up with Tye Savoy, NP On 11/18/2014.   Specialty:  Nurse Practitioner   Why:  at 10:30am   Contact information:   [address] [place] [address] [phone]        The results of significant diagnostics from this hospitalization (including imaging, microbiology, ancillary and laboratory) are listed below for reference.    Significant Diagnostic Studies: Ct Abdomen Pelvis W Contrast  11/01/2014   CLINICAL DATA:  Burning abdominal pain, bright red  blood in bowel movements  EXAM: CT ABDOMEN AND PELVIS WITH CONTRAST  TECHNIQUE: Multidetector CT imaging of the abdomen and pelvis was performed using the standard protocol following bolus administration of intravenous contrast.  CONTRAST:  150m OMNIPAQUE IOHEXOL 300 MG/ML  SOLN  COMPARISON:  None.  FINDINGS: The lung bases are clear.  The liver demonstrates no focal abnormality. There is no intrahepatic or extrahepatic biliary ductal dilatation. The gallbladder is normal. The spleen demonstrates no focal abnormality. The kidneys, adrenal glands and pancreas are normal. The bladder is unremarkable.  There is no bowel dilatation. 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 There is ascending and transverse colon bowel wall thickening without significant pericolonic inflammatory changes. The descending colon is difficult evaluate secondary to underdistention. There is mild bowel wall thickening involving the rectum which may reflect mild proctitis. There is a small fat containing umbilical hernia. There is no pneumoperitoneum, pneumatosis, or  portal venous gas. There is no abdominal or pelvic free fluid. There is no lymphadenopathy.  The abdominal aorta is normal in caliber .  There are no lytic or sclerotic osseous lesions.  IMPRESSION: 1. Mild ascending and transverse colitis which may be secondary to an infectious etiologies versus Crohn's disease. There is mild bowel wall thickening involving the rectum which may reflect mild proctitis. 2. 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   Electronically Signed   By: HKathreen Devoid  On: 11/01/2014 22:38    Microbiology: No results found for this or any previous visit (from the past 240 hour(s)).   Labs: Basic Metabolic Panel:  Recent Labs Lab 11/01/14 1853 11/02/14 0230 11/03/14 0538 11/04/14 0458 11/05/14 0520  NA 140 136* 141 138 140  K 3.4* 4.0 3.7 3.7 4.4  CL 104 104 107 105 107  CO2 21 19 23 21 21   GLUCOSE 97 102* 135* 125* 121*  BUN 6 4* 4* 4* 7  CREATININE 0.58 0.50 0.43* 0.45* 0.41*  CALCIUM 8.9 8.5 8.4 8.3* 8.3*   Liver Function Tests:  Recent Labs Lab 11/01/14 1853 11/02/14 0230  AST 14 16  ALT 15 13  ALKPHOS 81 72  BILITOT <0.2* 0.2*  PROT 7.6 7.0  ALBUMIN 3.1* 2.9*    Recent Labs Lab 11/01/14 1853  LIPASE 30   No results for input(s): AMMONIA in the last 168 hours. CBC:  Recent Labs Lab 11/01/14 1853 11/02/14 0230 11/02/14 1420 11/03/14 0538 11/04/14 0458 11/05/14 0520 11/07/14 0551  WBC 10.0 10.0  --  9.0 7.4 9.3 13.3*  NEUTROABS 6.0  --   --   --   --   --   --   HGB 11.3* 10.4* 10.4* 10.0* 10.4* 10.5* 11.1*  HCT 34.5* 31.7*  --  30.2* 32.1* 32.7* 34.4*  MCV 88.2 89.5  --  88.6 87.9 87.7 90.1  PLT 300 252  --  279 277 303 342   Cardiac Enzymes: No results for input(s): CKTOTAL, CKMB, CKMBINDEX, TROPONINI in the last 168 hours. BNP: BNP (last 3 results) No results for input(s): PROBNP in the last 8760 hours. CBG: No results for input(s): GLUCAP in the last 168  hours.   Additional labs: 1. CRP: 1.1 2. Stool GI pathogen panel PCR: Positive for Norovirus and negative for C. Difficile. 3. Pathology:  Diagnosis Colon, biopsy, random - CHRONIC ACTIVE COLITIS, CLINICALLY DIVERSION COLITIS. - NO DYSPLASIA OR MALIGNANCY IDENTIFIED. - SEE COMMENT.  REASON FOR ADDENDUM, AMENDMENT OR CORRECTION: [id number]: Additional clinical information was provided by Dr. Olevia Perches per phone conversation. Per her report, she noted that the patient had a diverted segment of colon which had not been joined to the main segment of colon for 3 years. 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 The case is updated accordingly. The updated findings are discussed with Dr. Olevia Perches on 11/06/14. Dr. Donato Heinz has reviewed the case in light of the clinical information provided by Dr. Olevia Perches, and is in agreement with the changes.   Signed:  Vernell Leep, MD, FACP, FHM. [facility] Pager [phone]  If 7PM-7AM, please contact night-coverage [email or web address] Password [email or web address] 11/07/2014, 1:58 PM

## 2014-11-07 NOTE — Discharge Instructions (Signed)
Colitis Colitis is inflammation of the colon. Colitis can be a short-term or long-standing (chronic) illness. Crohn's disease and ulcerative colitis are 2 types of colitis which are chronic. They usually require lifelong treatment. CAUSES  There are many different causes of colitis, including:  Viruses.  Germs (bacteria).  Medicine reactions. SYMPTOMS   Diarrhea.  Intestinal bleeding.  Pain.  Fever.  Throwing up (vomiting).  Tiredness (fatigue).  Weight loss.  Bowel blockage. DIAGNOSIS  The diagnosis of colitis is based on examination and stool or blood tests. X-rays, CT scan, and colonoscopy may also be needed. TREATMENT  Treatment may include:  Fluids given through the vein (intravenously).  Bowel rest (nothing to eat or drink for a period of time).  Medicine for pain and diarrhea.  Medicines (antibiotics) that kill germs.  Cortisone medicines.  Surgery. HOME CARE INSTRUCTIONS   Get plenty of rest.  Drink enough water and fluids to keep your urine clear or pale yellow.  Eat a well-balanced diet.  Call your caregiver for follow-up as recommended. SEEK IMMEDIATE MEDICAL CARE IF:   You develop chills.  You have an oral temperature above 102 F (38.9 C), not controlled by medicine.  You have extreme weakness, fainting, or dehydration.  You have repeated vomiting.  You develop severe belly (abdominal) pain or are passing bloody or tarry stools. MAKE SURE YOU:   Understand these instructions.  Will watch your condition.  Will get help right away if you are not doing well or get worse. Document Released: 01/12/2005 Document Revised: 02/27/2012 Document Reviewed: 04/09/2010 [facility] Patient Information 2015 [place], [place]. This information is not intended to replace advice given to you by your health care provider. Make sure you discuss any questions you have with your health care provider.

## 2014-11-07 NOTE — Progress Notes (Signed)
Discussed discharge summary with patient. Reviewed all medications with patient. Patient received Rx. Patient did not have any further questions. Patient ready for discharge.

## 2014-11-07 NOTE — Progress Notes (Signed)
    Progress Note   Subjective  still having abdominal pain. Headache this am. Bloody mucoid discharge improving   Objective   Vital signs in last 24 hours: Temp:  [97.9 F (36.6 C)-98 F (36.7 C)] 97.9 F (36.6 C) (11/20 0604) Pulse Rate:  [61-78] 61 (11/20 0604) Resp:  [17] 17 (11/20 0604) BP: (104-117)/(57-71) 104/57 mmHg (11/20 0604) SpO2:  [99 %-100 %] 99 % (11/20 0604) Weight:  [169 lb 12.8 oz (77.021 kg)] 169 lb 12.8 oz (77.021 kg) (11/20 0604) Last BM Date: 11/06/14 General:    Pleasant black female in NAD Abdomen:  Soft, nondistended, moderate left mid and left lower quadrant tenderness. Normal bowel sounds. Extremities:  Without edema. Neurologic:  Alert and oriented,  grossly normal neurologically. Psych:  Cooperative. Normal mood and affect.  Lab Results:  Recent Labs  11/05/14 0520 11/07/14 0551  WBC 9.3 13.3*  HGB 10.5* 11.1*  HCT 32.7* 34.4*  PLT 303 342   BMET  Recent Labs  11/05/14 0520  NA 140  K 4.4  CL 107  CO2 21  GLUCOSE 121*  BUN 7  CREATININE 0.41*  CALCIUM 8.3*     Assessment / Plan:   1. Crohn's, s/p ileostomy. She did not have reversal, mother lost insurance. She hasn't been on IBD meds for 3 years now.  2. Diversion colitis (see path addendum). Improving on on flagyl, rowasa enemas and solumedrol.  Short chain fatty acids treatment of choice but not sure insurance would pay for that or Rowasa. At discharge recommend:  Hydrocortisone retention enemas BID (continue until seen by [place] 12/1)  Stop oral steroids and flagyl.   She is to see colorectal surgeon (Dr. Marcello Moores) on 12/14 for consideration of ileostomy reversal.  Trial of Bentyl 37m BID for abdominal discomfort.  Please give her hydrocodone for pain upon discharge  Low fiber diet for now (given colitis on CTscan)  She will follow up with [place] 11[place]18/[place]. Patient knows to call in the interim for problems/questions  3. Positive norovirus which usually affects small  bowel. She really wasn't having increased ostomy output. No small bowel inflammation on CTscan. Usually self limiting infection.     LOS: 5 days    LOS: 6 days   PTye Savoy 11/07/2014, 10:23 AM  Attending MD note:   I have taken a history, examined the patient, and reviewed the chart. I agree with the Advanced Practitioner's impression and recommendations. Please see my progress note from this am with recommendations for discharge  DMelburn PopperGastroenterology Pager # [phone]

## 2014-11-18 ENCOUNTER — Encounter: Payer: Self-pay | Admitting: Nurse Practitioner

## 2014-11-18 ENCOUNTER — Ambulatory Visit (INDEPENDENT_AMBULATORY_CARE_PROVIDER_SITE_OTHER): Payer: Medicaid Other | Admitting: Nurse Practitioner

## 2014-11-18 VITALS — BP 102/80 | HR 60 | Ht 66.0 in | Wt 159.0 lb

## 2014-11-18 DIAGNOSIS — K50918 Crohn's disease, unspecified, with other complication: Secondary | ICD-10-CM

## 2014-11-18 DIAGNOSIS — K5289 Other specified noninfective gastroenteritis and colitis: Secondary | ICD-10-CM

## 2014-11-18 NOTE — Patient Instructions (Signed)
Keep your surgical appointment

## 2014-11-19 ENCOUNTER — Encounter: Payer: Self-pay | Admitting: Nurse Practitioner

## 2014-11-19 DIAGNOSIS — K5289 Other specified noninfective gastroenteritis and colitis: Secondary | ICD-10-CM | POA: Insufficient documentation

## 2014-11-19 NOTE — Progress Notes (Signed)
Reviewed. We stopped steroids  Before she was discharge because Path report pointed to diversion colitis. Dr Barry Dienes is aware of her and will see her for reversal of ileostomy.

## 2014-11-19 NOTE — Progress Notes (Addendum)
History of Present Illness:   Jessica [name] is a 18 year old female with a PMH of Crohn's disease diagnosed at the age of 11, She is s/p ileostomy, treated at [facility] until 2012 when mother lost her insurance. She never did come to a reanastamosis of her intestines. Patient has not been on any treatment for Crohn's in 3 years. We met Jessica [name] a couple of weeks ago while she was hospitalized for abdominal pain and bloody, mucoid rectal discharge.  A CT scan revealed colitis and a peristomal hernia. She subsequently underwent sigmoidoscopy with findings of diffuse colitis. Biopsies most c/w diversion colitis. Hydrocortisone enemas started, rectal discharged began to improve and patient was discharged home. Prior to discharge we spoke with Colorectal Surgery (Dr. Marcello Moores) about reversing ileostomy. Patient has appointment within the next week or so.   Patient is still having blood tinged mucous from rectum, especially after each enema. She also continues to have diffuse abdominal pain, especially while expelling enema contents.    Current Medications, Allergies, Past Medical History, Past Surgical History, Family History and Social History were reviewed in [employer] record.  Studies:   Ct Abdomen Pelvis W Contrast  11/01/2014   CLINICAL DATA:  Burning abdominal pain, bright red blood in bowel movements  EXAM: CT ABDOMEN AND PELVIS WITH CONTRAST  TECHNIQUE: Multidetector CT imaging of the abdomen and pelvis was performed using the standard protocol following bolus administration of intravenous contrast.  CONTRAST:  121m OMNIPAQUE IOHEXOL 300 MG/ML  SOLN  COMPARISON:  None.  FINDINGS: The lung bases are clear.  The liver demonstrates no focal abnormality. There is no intrahepatic or extrahepatic biliary ductal dilatation. The gallbladder is normal. The spleen demonstrates no focal abnormality. The kidneys, adrenal glands and pancreas are normal. The bladder is unremarkable.  There  is no bowel dilatation. 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 There is ascending and transverse colon bowel wall thickening without significant pericolonic inflammatory changes. The descending colon is difficult evaluate secondary to underdistention. There is mild bowel wall thickening involving the rectum which may reflect mild proctitis. There is a small fat containing umbilical hernia. There is no pneumoperitoneum, pneumatosis, or portal venous gas. There is no abdominal or pelvic free fluid. There is no lymphadenopathy.  The abdominal aorta is normal in caliber .  There are no lytic or sclerotic osseous lesions.  IMPRESSION: 1. Mild ascending and transverse colitis which may be secondary to an infectious etiologies versus Crohn's disease. There is mild bowel wall thickening involving the rectum which may reflect mild proctitis. 2. 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   Electronically Signed   By: HKathreen Devoid  On: 11/01/2014 22:38     Physical Exam: General: Pleasant, well developed ,  Black female in no acute distress Head: Normocephalic and atraumatic Eyes:  sclerae anicteric, conjunctiva pink  Ears: Normal auditory acuity Lungs: Clear throughout to auscultation Heart: Regular rate and rhythm Abdomen: Soft, non distended, mild LLQ tenderness. Ostomy bag with semi-solid, light brown stool. . Normal bowel sounds Rectal: Nothing in vault. Gloved finger heme negative.  Musculoskeletal: Symmetrical with no gross deformities  Extremities: No edema  Neurological: Alert oriented x 4, grossly nonfocal Psychological:  Alert and cooperative. Normal mood and affect  Assessment and Recommendations: 18year old female with long standing history of Crohn's , s/p ileostomy. Never had reversal since  mother lost insurance in 2012. No Crohn's meds in 3 years.  Recent admission  for rectal bleeding / abdominal pain  CTscan revealed colitis. Colon biopsies c/w diversion colitis. Medicaid doesn't pay for Rowasa enemas. I called pharmacy today about short chain fatty acid enemas but Medicaid doesn't cover them either and average cost is about 250.00 / month which is not affordable for her. For now she will continue Hydrocortisone enemas. She has appointment with Colorectal Surgery within the next couple of weeks.

## 2014-11-20 NOTE — Progress Notes (Signed)
I prefer to use Cort enemas till she sees Dr Barry Dienes who will determine further course.

## 2014-11-21 ENCOUNTER — Emergency Department (HOSPITAL_COMMUNITY): Payer: Medicaid Other

## 2014-11-21 ENCOUNTER — Emergency Department (HOSPITAL_COMMUNITY)
Admission: EM | Admit: 2014-11-21 | Discharge: 2014-11-21 | Disposition: A | Discharge status: Home / Self Care | Payer: Medicaid Other | Attending: Emergency Medicine | Admitting: Emergency Medicine

## 2014-11-21 ENCOUNTER — Encounter (HOSPITAL_COMMUNITY): Payer: Self-pay | Admitting: *Deleted

## 2014-11-21 DIAGNOSIS — R109 Unspecified abdominal pain: Secondary | ICD-10-CM

## 2014-11-21 DIAGNOSIS — Z932 Ileostomy status: Secondary | ICD-10-CM | POA: Insufficient documentation

## 2014-11-21 DIAGNOSIS — Z862 Personal history of diseases of the blood and blood-forming organs and certain disorders involving the immune mechanism: Secondary | ICD-10-CM | POA: Insufficient documentation

## 2014-11-21 DIAGNOSIS — Z3202 Encounter for pregnancy test, result negative: Secondary | ICD-10-CM | POA: Diagnosis not present

## 2014-11-21 DIAGNOSIS — K529 Noninfective gastroenteritis and colitis, unspecified: Secondary | ICD-10-CM | POA: Insufficient documentation

## 2014-11-21 DIAGNOSIS — Z9889 Other specified postprocedural states: Secondary | ICD-10-CM | POA: Insufficient documentation

## 2014-11-21 DIAGNOSIS — R112 Nausea with vomiting, unspecified: Secondary | ICD-10-CM | POA: Diagnosis not present

## 2014-11-21 DIAGNOSIS — K5289 Other specified noninfective gastroenteritis and colitis: Secondary | ICD-10-CM

## 2014-11-21 DIAGNOSIS — Z79899 Other long term (current) drug therapy: Secondary | ICD-10-CM | POA: Insufficient documentation

## 2014-11-21 LAB — URINALYSIS, ROUTINE W REFLEX MICROSCOPIC
Bilirubin Urine: NEGATIVE
Glucose, UA: NEGATIVE mg/dL
Ketones, ur: NEGATIVE mg/dL
Nitrite: NEGATIVE
Protein, ur: NEGATIVE mg/dL
Specific Gravity, Urine: 1.021 (ref 1.005–1.030)
Urobilinogen, UA: 0.2 mg/dL (ref 0.0–1.0)
pH: 5.5 (ref 5.0–8.0)

## 2014-11-21 LAB — COMPREHENSIVE METABOLIC PANEL
ALT: 18 U/L (ref 0–35)
AST: 20 U/L (ref 0–37)
Albumin: 3.7 g/dL (ref 3.5–5.2)
Alkaline Phosphatase: 80 U/L (ref 39–117)
Anion gap: 13 (ref 5–15)
BUN: 7 mg/dL (ref 6–23)
CO2: 22 mEq/L (ref 19–32)
Calcium: 9.2 mg/dL (ref 8.4–10.5)
Chloride: 104 mEq/L (ref 96–112)
Creatinine, Ser: 0.56 mg/dL (ref 0.50–1.10)
GFR calc Af Amer: >90 mL/min (ref >90)
GFR calc non Af Amer: >90 mL/min (ref >90)
Glucose, Bld: 79 mg/dL (ref 70–99)
Potassium: 4.2 mEq/L (ref 3.7–5.3)
Sodium: 139 mEq/L (ref 137–147)
Total Bilirubin: 0.3 mg/dL (ref 0.3–1.2)
Total Protein: 7.9 g/dL (ref 6.0–8.3)

## 2014-11-21 LAB — CBC WITH DIFFERENTIAL/PLATELET
Basophils Absolute: 0 10*3/uL (ref 0.0–0.1)
Basophils Relative: 0 % (ref 0–1)
Eosinophils Absolute: 0 10*3/uL (ref 0.0–0.7)
Eosinophils Relative: 1 % (ref 0–5)
HCT: 36.4 % (ref 36.0–46.0)
Hemoglobin: 11.8 g/dL — ABNORMAL LOW (ref 12.0–15.0)
Lymphocytes Relative: 21 % (ref 12–46)
Lymphs Abs: 1.1 10*3/uL (ref 0.7–4.0)
MCH: 29.1 pg (ref 26.0–34.0)
MCHC: 32.4 g/dL (ref 30.0–36.0)
MCV: 89.7 fL (ref 78.0–100.0)
Monocytes Absolute: 0.4 10*3/uL (ref 0.1–1.0)
Monocytes Relative: 7 % (ref 3–12)
Neutro Abs: 3.6 10*3/uL (ref 1.7–7.7)
Neutrophils Relative %: 71 % (ref 43–77)
Platelets: 267 10*3/uL (ref 150–400)
RBC: 4.06 MIL/uL (ref 3.87–5.11)
RDW: 12.9 % (ref 11.5–15.5)
WBC: 5.1 10*3/uL (ref 4.0–10.5)

## 2014-11-21 LAB — URINE MICROSCOPIC-ADD ON

## 2014-11-21 LAB — POC OCCULT BLOOD, ED: Fecal Occult Bld: POSITIVE — AB

## 2014-11-21 LAB — LIPASE, BLOOD: Lipase: 62 U/L — ABNORMAL HIGH (ref 11–59)

## 2014-11-21 MED ORDER — METOCLOPRAMIDE HCL 5 MG/ML IJ SOLN: 10.0000 mg | Freq: Once | INTRAMUSCULAR | Status: DC

## 2014-11-21 MED ORDER — DIPHENHYDRAMINE HCL 50 MG/ML IJ SOLN
25.0000 mg | Freq: Once | INTRAMUSCULAR | Status: AC
  Administered 2014-11-21: 25 mg via INTRAVENOUS

## 2014-11-21 MED ORDER — ONDANSETRON HCL 4 MG PO TABS: 4.0000 mg | ORAL_TABLET | Freq: Once | ORAL | Status: DC

## 2014-11-21 MED ORDER — SODIUM CHLORIDE 0.9 % IV BOLUS (SEPSIS)
1000.0000 mL | Freq: Once | INTRAVENOUS | Status: AC
  Administered 2014-11-21: 1000 mL via INTRAVENOUS

## 2014-11-21 MED ORDER — METOCLOPRAMIDE HCL 5 MG/ML IJ SOLN
10.0000 mg | Freq: Once | INTRAMUSCULAR | Status: AC
  Administered 2014-11-21: 10 mg via INTRAVENOUS

## 2014-11-21 MED ORDER — HYDROMORPHONE HCL 1 MG/ML IJ SOLN
0.5000 mg | Freq: Once | INTRAMUSCULAR | Status: AC
  Administered 2014-11-21: 0.5 mg via INTRAVENOUS

## 2014-11-21 MED ORDER — HYDROCORTISONE 100 MG/60ML RE ENEM: 1.0000 | ENEMA | Freq: Every day | RECTAL | Status: DC

## 2014-11-21 MED ORDER — OXYCODONE-ACETAMINOPHEN 5-325 MG PO TABS: 1.0000 | ORAL_TABLET | Freq: Four times a day (QID) | ORAL | Status: DC | PRN

## 2014-11-21 MED ORDER — ONDANSETRON HCL 4 MG PO TABS: 4.0000 mg | ORAL_TABLET | Freq: Three times a day (TID) | ORAL | Status: DC | PRN

## 2014-11-21 MED ORDER — KETOROLAC TROMETHAMINE 30 MG/ML IJ SOLN
30.0000 mg | Freq: Once | INTRAMUSCULAR | Status: AC
Start: 2014-11-21 — End: 2014-11-21
  Administered 2014-11-21: 30 mg via INTRAVENOUS

## 2014-11-21 MED ORDER — ONDANSETRON HCL 4 MG/2ML IJ SOLN
4.0000 mg | Freq: Once | INTRAMUSCULAR | Status: AC
  Administered 2014-11-21: 4 mg via INTRAVENOUS

## 2014-11-21 MED ORDER — FENTANYL CITRATE 0.05 MG/ML IJ SOLN
100.0000 ug | Freq: Once | INTRAMUSCULAR | Status: AC
  Administered 2014-11-21: 100 ug via INTRAVENOUS

## 2014-11-21 MED ORDER — HYDROMORPHONE HCL 1 MG/ML IJ SOLN
1.0000 mg | Freq: Once | INTRAMUSCULAR | Status: AC
  Administered 2014-11-21: 1 mg via INTRAVENOUS

## 2014-11-21 NOTE — ED Notes (Signed)
Pt reports recently being admitted to hospital for colitis, pt has colostomy and was having rectal bleeding. Now reports having a headache since being dc home and now having rectal leakage and abd pain.

## 2014-11-21 NOTE — ED Notes (Signed)
Dr.Knapp at bedside  

## 2014-11-21 NOTE — Discharge Instructions (Signed)
Colitis Colitis is inflammation of the colon. Colitis can be a short-term or long-standing (chronic) illness. Crohn's disease and ulcerative colitis are 2 types of colitis which are chronic. They usually require lifelong treatment. CAUSES  There are many different causes of colitis, including:  Viruses.  Germs (bacteria).  Medicine reactions. SYMPTOMS   Diarrhea.  Intestinal bleeding.  Pain.  Fever.  Throwing up (vomiting).  Tiredness (fatigue).  Weight loss.  Bowel blockage. DIAGNOSIS  The diagnosis of colitis is based on examination and stool or blood tests. X-rays, CT scan, and colonoscopy may also be needed. TREATMENT  Treatment may include:  Fluids given through the vein (intravenously).  Bowel rest (nothing to eat or drink for a period of time).  Medicine for pain and diarrhea.  Medicines (antibiotics) that kill germs.  Cortisone medicines.  Surgery. HOME CARE INSTRUCTIONS   Get plenty of rest.  Drink enough water and fluids to keep your urine clear or pale yellow.  Eat a well-balanced diet.  Call your caregiver for follow-up as recommended. SEEK IMMEDIATE MEDICAL CARE IF:   You develop chills.  You have an oral temperature above 102 F (38.9 C), not controlled by medicine.  You have extreme weakness, fainting, or dehydration.  You have repeated vomiting.  You develop severe belly (abdominal) pain or are passing bloody or tarry stools. MAKE SURE YOU:   Understand these instructions.  Will watch your condition.  Will get help right away if you are not doing well or get worse. Document Released: 01/12/2005 Document Revised: 02/27/2012 Document Reviewed: 04/09/2010 [facility] Patient Information 2015 [place], [place]. This information is not intended to replace advice given to you by your health care provider. Make sure you discuss any questions you have with your health care provider.

## 2014-11-21 NOTE — ED Provider Notes (Signed)
CSN: [id number]     Arrival date & time 11/21/14  1122 History   First MD Initiated Contact with Patient 11/21/14 1137     Chief Complaint  Patient presents with  . Abdominal Pain  . Emesis   HPI  Patient is a 18 year old female who presents emergency room for evaluation of lower abdominal pain. Patient states that this pain has been going on for close to 4 weeks. She states that she was having similar pain 2 weeks ago and was admitted to the hospital on 1114 for the same. She states that she has a history of Crohn's disease and has an ileostomy due to same. She states that for the past 4 weeks she has been passing stool from her rectum. She states that the stool from her rectum has been jellylike and has had blood in it. She states that it improved when she was hospitalized 2 weeks ago with fleets enemas. She stated that she was admitted to the hospital due to colitis. She is not currently followed by a GI doctor, but was seen in the hospital by Dr. Maurene Capes in her colleagues. She said that they wanted to place her on a medication for her Crohn's disease, but Medicaid would not cover it. She states that she was sent home for the holidays that she was starting to do better but was told to come back if she had worsening pain. She states that she has had severe nausea and vomiting for the past 3 days. She states that she is vomiting 3-4 times a day. She has not been able to keep any food or liquid down. She states that her ostomy output had been normal up until the vomiting started. She also states the stool from her rectum has continued and still has blood in it. She is also complaining of headaches. She states that the headaches were intermittent at first last 3 days that has become more constant. She said that these headaches feel like headaches that she's had in the past only worse. She states that her head feels like it's in a vice grip and is a constant throbbing pain. She states her headache is an 8 out  of 10 at this time. She tried taking Tylenol at home with no relief.  Past Medical History  Diagnosis Date  . Crohn's disease   . Anemia   . Ileostomy in place    Past Surgical History  Procedure Laterality Date  . Small intestine surgery    . Incision and drainage Left     arm  . Dilation and evacuation N/A 10/31/2014    Procedure: DILATATION AND EVACUATION;  Surgeon: Lahoma Crocker, MD;  Location: [facility];  Service: Gynecology;  Laterality: N/A;  . Flexible sigmoidoscopy N/A 11/04/2014    Procedure: FLEXIBLE SIGMOIDOSCOPY;  Surgeon: [name], MD;  Location: [facility] ENDOSCOPY;  Service: Endoscopy;  Laterality: N/A;   Family History  Problem Relation Age of Onset  . Diabetes Father   . Stroke Neg Hx   . Diabetes Maternal Grandmother   . Heart disease Maternal Grandmother   . Hypertension Maternal Grandmother   . Kidney disease Maternal Grandmother   . Cancer Maternal Grandfather     lung  . Hyperlipidemia Maternal Grandfather    History  Substance Use Topics  . Smoking status: Never Smoker   . Smokeless tobacco: Never Used  . Alcohol Use: No   OB History    Gravida Para Term Preterm AB TAB SAB Ectopic Multiple  Living   2 1 1       1      Review of Systems  Constitutional: Negative for fever, chills and fatigue.  Respiratory: Negative for chest tightness and shortness of breath.   Cardiovascular: Negative for chest pain and palpitations.  Gastrointestinal: Positive for nausea, vomiting, abdominal pain and blood in stool. Negative for diarrhea.  Genitourinary: Negative for dysuria, urgency, frequency, hematuria, vaginal bleeding, vaginal discharge and vaginal pain.  Musculoskeletal: Negative for neck pain and neck stiffness.  Skin: Negative for rash.  Neurological: Positive for headaches. Negative for dizziness, syncope, weakness and numbness.  All other systems reviewed and are negative.     Allergies  Review of patient's allergies indicates no known  allergies.  Home Medications   Prior to Admission medications   Medication Sig Start Date End Date Taking? Authorizing Provider  acetaminophen (TYLENOL) 500 MG tablet Take 1 tablet (500 mg total) by mouth every 12 (twelve) hours as needed for mild pain. 11/07/14  Yes Modena Jansky, MD  dicyclomine (BENTYL) 10 MG capsule Take 1 capsule (10 mg total) by mouth 2 (two) times daily. 11/07/14  Yes Modena Jansky, MD  norethindrone-ethinyl estradiol (MICROGESTIN,JUNEL,LOESTRIN) 1-20 MG-MCG tablet Take 1 tablet by mouth daily. Start in 3 weeks 10/31/14  Yes Lahoma Crocker, MD  hydrocortisone (CORTENEMA) 100 MG/60ML enema Place 1 enema (100 mg total) rectally 2 (two) times daily. Patient not taking: Reported on 11/21/2014 11/07/14   Modena Jansky, MD  oxyCODONE-acetaminophen (PERCOCET) 5-325 MG per tablet Take 2 tablets by mouth every 6 (six) hours as needed for severe pain. Patient not taking: Reported on 11/21/2014 10/31/14   Lahoma Crocker, MD   BP 108/76 mmHg  Pulse 86  Temp(Src) 98.7 F (37.1 C) (Oral)  Resp 16  SpO2 99%  LMP 11/07/2014 Physical Exam  Constitutional: She is oriented to person, place, and time. She appears well-developed and well-nourished. No distress.  HENT:  Head: Normocephalic and atraumatic.  Mouth/Throat: No oropharyngeal exudate.  Eyes: Conjunctivae and EOM are normal. Pupils are equal, round, and reactive to light. No scleral icterus.  Neck: Normal range of motion. Neck supple. No JVD present. No Brudzinski's sign and no Kernig's sign noted. No thyromegaly present.  Cardiovascular: Normal rate, regular rhythm, normal heart sounds and intact distal pulses.  Exam reveals no gallop and no friction rub.   No murmur heard. Pulmonary/Chest: Effort normal and breath sounds normal. No respiratory distress. She has no wheezes. She has no rales. She exhibits no tenderness.  Abdominal: Soft. Normal appearance and bowel sounds are normal. She exhibits no  distension and no mass. There is tenderness in the right lower quadrant, suprapubic area and left lower quadrant. There is no rebound and no guarding. No hernia.    Musculoskeletal: Normal range of motion.  Lymphadenopathy:    She has no cervical adenopathy.  Neurological: She is alert and oriented to person, place, and time. She has normal strength. No cranial nerve deficit or sensory deficit. Coordination normal.  Skin: Skin is warm and dry. She is not diaphoretic.  Psychiatric: She has a normal mood and affect. Her behavior is normal. Judgment and thought content normal.  Nursing note and vitals reviewed.   ED Course  Procedures (including critical care time) Labs Review Labs Reviewed  CBC WITH DIFFERENTIAL - Abnormal; Notable for the following:    Hemoglobin 11.8 (*)    All other components within normal limits  LIPASE, BLOOD - Abnormal; Notable for the following:  Lipase 62 (*)    All other components within normal limits  URINALYSIS, ROUTINE W REFLEX MICROSCOPIC - Abnormal; Notable for the following:    APPearance CLOUDY (*)    Hgb urine dipstick TRACE (*)    Leukocytes, UA LARGE (*)    All other components within normal limits  URINE MICROSCOPIC-ADD ON - Abnormal; Notable for the following:    Squamous Epithelial / LPF MANY (*)    Bacteria, UA MANY (*)    All other components within normal limits  POC OCCULT BLOOD, ED - Abnormal; Notable for the following:    Fecal Occult Bld POSITIVE (*)    All other components within normal limits  COMPREHENSIVE METABOLIC PANEL  POC URINE PREG, ED    Imaging Review Dg Abd 2 Views  11/21/2014   CLINICAL DATA:  Lower abdominal pain and vomiting. Crohn's disease, post ileostomy.  EXAM: ABDOMEN - 2 VIEW  COMPARISON:  CT 11/01/2014  FINDINGS: The bowel gas pattern is normal. There is no evidence of free air. No radio-opaque calculi or other significant radiographic abnormality is seen. Right lower quadrant ostomy in place.  IMPRESSION:  Negative.   Electronically Signed   By: Conchita Paris M.D.   On: 11/21/2014 14:30     EKG Interpretation None      MDM   Final diagnoses:  Abdominal pain  Diversion colitis   Patient is a 18 year old female who presents to the emergency room for evaluation of abdominal pain, nausea, and vomiting. Patient was recently admitted to the hospital several weeks ago for divergent colitis. Patient states that she is having symptoms that are exactly the same as when she was admitted. Patient states that when she was at home and could take Percocet she had good pain control. Patient is no longer using steroid enemas. He reveals only mild anemia. We will culture her urine, but patient does not have any urinary symptoms at this time we'll defer treatment for urine culture. Patient is Hemoccult positive. I observed the stool here in the emergency room which looks jellylike with blood mixed in. CMP is unremarkable. Urine pregnancy is negative. Lipase is only mildly elevated. Abdominal x-ray reveals no obstructive gas patterns. Patient was given headache cocktail here with relief of her headache symptoms. Patient has not had any further vomiting since dose of Reglan. If uncontrolled pain with fentanyl and Dilaudid. I have spoken with Nancy Marus PA-C from our gastroenterology who feels that the patient can likely go home and needs to restart steroid enemas.  Patient is tolerating PO here. She recommends sending the patient home with symptomatic treatment of Zofran for nausea. She encouraged me to discharge the patient home with pain control. Patient is to follow-up with Dr. Grandville Silos for her scheduled appointment on December 14 for discussion of ostomy reversal. Patient is to follow-up with lower GI for further issues. Patient is to return to the emergency room for intractable nausea and vomiting, intractable pain, or any other concerning symptoms. She states understanding and agreement at this time. Patient was  seen by and discussed with Dr. Tomi Bamberger who agrees with the above treatment and plan.    Cherylann Parr, PA-C 11/21/14 1652  Dorie Rank, MD 11/21/14 [phone]

## 2014-11-21 NOTE — ED Notes (Signed)
Courtney PA-C at bedside

## 2014-12-01 ENCOUNTER — Other Ambulatory Visit (INDEPENDENT_AMBULATORY_CARE_PROVIDER_SITE_OTHER): Payer: Self-pay | Admitting: General Surgery

## 2014-12-01 NOTE — H&P (Signed)
Jessica [name] 12/01/2014 2:40 PM Location: [facility] Patient #: [id number] DOB: 05-19-1996 Single / Language: Cleophus Molt / Race: Black or African American Female History of Present Illness [name] MD; 10/08/1172 3:04 PM) Patient words: diversion.  The patient is a 18 year old female who presents with Crohn's disease. This is a 18 year old female who underwent a ileocecectomy and diverting loop ileostomy approximately 4 years ago due to a ileocolonic flare of her Crohn's disease. She was given a loop ileostomy to protect a ileocolonic anastomosis. Her hospitalization was complicated by wound infection. Since then she has had no difficulty with her ostomy. She reports good function and denies abdominal pain or bleeding in her stool. She was recently hospitalized due to rectal bleeding. A colonoscopy was performed which showed what appeared to be diversion colitis. A CT scan was also performed which showed some thickening of the bowel wall. The CT scan also showed the anastomosis was widely patent. She is on a steroid enema right now and is had much less bleeding. Other Problems Marjean Donna, [place]; 12/01/2014 2:41 PM) Crohn's Disease Other disease, cancer, significant illness  Past Surgical History Marjean Donna, [place]; 12/01/2014 2:41 PM) Colon Removal - Partial Resection of Small Bowel  Diagnostic Studies History Marjean Donna, CMA; 12/01/2014 2:41 PM) Colonoscopy 1-5 years ago Mammogram never Pap Smear 1-5 years ago  Allergies Marjean Donna, CMA; 12/01/2014 2:42 PM) No Known Drug Allergies 12/01/2014  Medication History (Sonya Bynum, CMA; 12/01/2014 2:43 PM) Tylenol Extra Strength (500MG Tablet, Oral) Active. Bentyl (10MG/ML Solution, Intramuscular) Active. Cortenema (100MG/60ML Enema, Rectal) Active. Loestrin Fe 1/20 (1-20MG-MCG Tablet, Oral) Active. Zofran ODT (4MG Tablet Disperse, Oral as needed) Active. Oxycodone-Acetaminophen (5-325MG Tablet,  Oral as needed) Active.  Social History ([place]; 12/01/2014 2:41 PM) Caffeine use Carbonated beverages, Coffee, Tea. No alcohol use No drug use Tobacco use Never smoker.  Family History Marjean Donna, [place]; 12/01/2014 2:41 PM) Diabetes Mellitus Father. Heart disease in female family member before age 70 Ischemic Bowel Disease Mother. Migraine Headache Mother.  Pregnancy / Birth History Marjean Donna, [place]; 12/01/2014 2:41 PM) Age at menarche 47 years. Gravida 2 Irregular periods Maternal age 51-20 Para 1     Review of Systems ([place]; 12/01/2014 2:41 PM) General Not Present- Appetite Loss, Chills, Fatigue, Fever, Night Sweats, Weight Gain and Weight Loss. Skin Present- Dryness. Not Present- Change in Wart/Mole, Hives, Jaundice, New Lesions, Non-Healing Wounds, Rash and Ulcer. HEENT Present- Wears glasses/contact lenses. Not Present- Earache, Hearing Loss, Hoarseness, Nose Bleed, Oral Ulcers, Ringing in the Ears, Seasonal Allergies, Sinus Pain, Sore Throat, Visual Disturbances and Yellow Eyes. Respiratory Not Present- Bloody sputum, Chronic Cough, Difficulty Breathing, Snoring and Wheezing. Breast Not Present- Breast Mass, Breast Pain, Nipple Discharge and Skin Changes. Cardiovascular Not Present- Chest Pain, Difficulty Breathing Lying Down, Leg Cramps, Palpitations, Rapid Heart Rate, Shortness of Breath and Swelling of Extremities. Gastrointestinal Present- Abdominal Pain, Bloating, Bloody Stool and Nausea. Not Present- Change in Bowel Habits, Chronic diarrhea, Constipation, Difficulty Swallowing, Excessive gas, Gets full quickly at meals, Hemorrhoids, Indigestion, Rectal Pain and Vomiting. Musculoskeletal Present- Joint Pain and Muscle Pain. Not Present- Back Pain, Joint Stiffness, Muscle Weakness and Swelling of Extremities. Neurological Present- Headaches. Not Present- Decreased Memory, Fainting, Numbness, Seizures, Tingling, Tremor, Trouble walking and  Weakness. Psychiatric Not Present- Anxiety, Bipolar, Change in Sleep Pattern, Depression, Fearful and Frequent crying. Endocrine Not Present- Cold Intolerance, Excessive Hunger, Hair Changes, Heat Intolerance, Hot flashes and New Diabetes. Hematology Not Present- Easy Bruising, Excessive bleeding,  Gland problems, HIV and Persistent Infections.  Vitals (Sonya Bynum CMA; 12/01/2014 2:41 PM) 12/01/2014 2:41 PM Weight: 160 lb Height: 66in Body Surface Area: 1.84 m Body Mass Index: 25.82 kg/m Temp.: 97.23F(Temporal)  Pulse: 77 (Regular)  BP: 126/74 (Sitting, Left Arm, Standard)     Physical Exam [name] MD; 72/62/0355 3:08 PM)  General Mental Status-Alert. General Appearance-Consistent with stated age. Hydration-Well hydrated. Voice-Normal.  Head and Neck Head-normocephalic, atraumatic with no lesions or palpable masses. Trachea-midline. Thyroid Gland Characteristics - normal size and consistency.  Eye Eyeball - Bilateral-Extraocular movements intact. Sclera/Conjunctiva - Bilateral-No scleral icterus.  Chest and Lung Exam Chest and lung exam reveals -quiet, even and easy respiratory effort with no use of accessory muscles and on auscultation, normal breath sounds, no adventitious sounds and normal vocal resonance. Inspection Chest Wall - Normal. Back - normal.  Breast Breast - Left-Symmetric, Non Tender, No Biopsy scars, no Dimpling, No Inflammation, No Lumpectomy scars, No Mastectomy scars, No Peau d' Orange. Breast - Right-Symmetric, Non Tender, No Biopsy scars, no Dimpling, No Inflammation, No Lumpectomy scars, No Mastectomy scars, No Peau d' Orange. Breast Lump-No Palpable Breast Mass.  Cardiovascular Cardiovascular examination reveals -normal heart sounds, regular rate and rhythm with no murmurs and normal pedal pulses bilaterally.  Abdomen Inspection  Inspection of the abdomen reveals: Note: loop ileostomy, pink,  viable. Skin - Scar - Note: lower midline scar. Palpation/Percussion Palpation and Percussion of the abdomen reveal - Soft, Non Tender, No Rebound tenderness, No Rigidity (guarding) and No hepatosplenomegaly. Auscultation Auscultation of the abdomen reveals - Bowel sounds normal.  Neurologic Neurologic evaluation reveals -alert and oriented x 3 with no impairment of recent or remote memory. Mental Status-Normal.  Musculoskeletal Normal Exam - Left-Upper Extremity Strength Normal and Lower Extremity Strength Normal. Normal Exam - Right-Upper Extremity Strength Normal and Lower Extremity Strength Normal.    Assessment & Plan [name] MD; 97/41/6384 3:06 PM)  CROHN'S DISEASE IN REMISSION (555.9  K50.49) Story: 18 year old female with Crohn's disease in remission. Patient with diversion colitis. Impression: Recommended takedown of her loop ileostomy to help with her diversion colitis. I think she will tolerate this well. I will discuss with Dr. Olevia Perches about starting a maintenance dose Crohn's therapy to help prevent a Crohn's flare perioperatively. The surgery and anatomy were described to the patient as well as the risks of surgery and the possible complications. These include: Bleeding, deep abdominal infections and possible wound complications such as hernia and infection, damage to adjacent structures, leak of surgical connections, which can lead to other surgeries and possibly an ostomy, possible need for other procedures, such as abscess drains in radiology, possible prolonged hospital stay, possible constipation from narcotics, prolonged fatigue/weakness or appetite loss, possible early recurrence of of disease, possible complications of their medical problems such as heart disease or arrhythmias or lung problems, death (less than 1%). I believe the patient understands and wishes to proceed with the surgery.

## 2014-12-02 ENCOUNTER — Telehealth: Payer: Self-pay | Admitting: Internal Medicine

## 2014-12-02 ENCOUNTER — Telehealth: Payer: Self-pay | Admitting: *Deleted

## 2014-12-02 MED ORDER — MESALAMINE ER 500 MG PO CPCR
ORAL_CAPSULE | ORAL | Status: DC
Start: 2014-12-02 — End: 2014-12-05

## 2014-12-02 NOTE — Telephone Encounter (Signed)
-----   Message from [name], MD sent at 12/01/2014  5:22 PM EST ----- Regarding: RE: crohn's treatment Jessica [name], thanks for letting me know. We will start her on Pentasa 500 mg two 3x/day., which is a good maintenance therapy for Crohn's and which will not compromise her immune system before and after the surgery.We will let her know. We will stop Cort enemas which could adversely affect healing at the new anastomosis. Thanx for your help. Rollene Fare, could you, please notify the pt and tell her that Dr Marcello Moores and I connected and we would like her to begin Pentasa 500 mg, 2 po tid = 3gm/day., #180,3 refills. thanx DB ----- Message -----    From: [name], MD    Sent: 12/01/2014   2:58 PM      To: [name], MD Subject: crohn's treatment                              Dora, I am planning on taking down her ileostomy soon.  Would it be possible to get her on some maintenance therapy prior to surgery?   Jessica [name]

## 2014-12-02 NOTE — Telephone Encounter (Signed)
Patient given recommendations. Rx sent to pharmacy.

## 2014-12-02 NOTE — Telephone Encounter (Signed)
Pharmacy will send prior auth request with appropriate information.

## 2014-12-03 NOTE — Telephone Encounter (Signed)
[id number]

## 2014-12-03 NOTE — Telephone Encounter (Signed)
Patient's insurance will not cover Pentasa. They will cover sulfasalazine, apriso, asacol and balsalazide.  Please advise.Marland KitchenMarland KitchenMarland Kitchen

## 2014-12-04 ENCOUNTER — Encounter: Payer: Medicaid Other | Admitting: Obstetrics & Gynecology

## 2014-12-05 ENCOUNTER — Telehealth: Payer: Self-pay | Admitting: Internal Medicine

## 2014-12-05 MED ORDER — SULFASALAZINE 500 MG PO TABS: 500.0000 mg | ORAL_TABLET | Freq: Two times a day (BID) | ORAL | Status: DC

## 2014-12-05 MED ORDER — FOLIC ACID 1 MG PO TABS: 1.0000 mg | ORAL_TABLET | Freq: Every day | ORAL | Status: DC

## 2014-12-05 NOTE — Telephone Encounter (Signed)
Dr Glenetta Borg see previous phone note and advise.Marland KitchenMarland KitchenMarland Kitchen

## 2014-12-05 NOTE — Telephone Encounter (Signed)
I have spoken to Dr Olevia Perches and she has advised that patient take sulfasalazine 500 mg 2 tablets daily in place of pentasa. I have sent new rx for pentasa to pharmacy and have left message advising patient of Dr Nichola Sizer recommendations. I have also sent folic acid for patient to take along with sulfasalazine.

## 2014-12-10 ENCOUNTER — Other Ambulatory Visit (HOSPITAL_COMMUNITY): Payer: Self-pay | Admitting: *Deleted

## 2014-12-10 NOTE — Patient Instructions (Addendum)
KELSYE LOOMER  12/10/2014   Your procedure is scheduled on: 12/18/14   Report to [facility]  Entrance and follow signs to               [place] at 5:30  AM.   Call this number if you have problems the morning of surgery [phone]   Remember:  Do not eat food or drink liquids :After Midnight.     Take these medicines the morning of surgery with A SIP OF WATER: SULFASALAZINE                               You may not have any metal on your body including hair pins and              piercings  Do not wear jewelry, make-up, lotions, powders or perfumes.             Do not wear nail polish.  Do not shave  48 hours prior to surgery.              Men may shave face and neck.   Do not bring valuables to the hospital. [place].  Contacts, dentures or bridgework may not be worn into surgery.  Leave suitcase in the car. After surgery it may be brought to your room.     Patients discharged the day of surgery will not be allowed to drive home.  Name and phone number of your driver:  Special Instructions: N/A              Please read over the following fact sheets you were given: _____________________________________________________________________                                                     [place]  Before surgery, you can play an important role.  Because skin is not sterile, your skin needs to be as free of germs as possible.  You can reduce the number of germs on your skin by washing with CHG (chlorahexidine gluconate) soap before surgery.  CHG is an antiseptic cleaner which kills germs and bonds with the skin to continue killing germs even after washing. Please DO NOT use if you have an allergy to CHG or antibacterial soaps.  If your skin becomes reddened/irritated stop using the CHG and inform your nurse when you arrive at Short Stay. Do not shave (including legs  and underarms) for at least 48 hours prior to the first CHG shower.  You may shave your face. Please follow these instructions carefully:   1.  Shower with CHG Soap the night before surgery and the  morning of Surgery.   2.  If you choose to wash your hair, wash your hair first as usual with your  normal  Shampoo.   3.  After you shampoo, rinse your hair and body thoroughly to remove the  shampoo.  4.  Use CHG as you would any other liquid soap.  You can apply chg directly  to the skin and wash . Gently wash with scrungie or clean wascloth    5.  Apply the CHG Soap to your body ONLY FROM THE NECK DOWN.   Do not use on open                           Wound or open sores. Avoid contact with eyes, ears mouth and genitals (private parts).                        Genitals (private parts) with your normal soap.              6.  Wash thoroughly, paying special attention to the area where your surgery  will be performed.   7.  Thoroughly rinse your body with warm water from the neck down.   8.  DO NOT shower/wash with your normal soap after using and rinsing off  the CHG Soap .                9.  Pat yourself dry with a clean towel.             10.  Wear clean pajamas.             11.  Place clean sheets on your bed the night of your first shower and do not  sleep with pets.  Day of Surgery : Do not apply any lotions/deodorants the morning of surgery.  Please wear clean clothes to the hospital/surgery center.  FAILURE TO FOLLOW THESE INSTRUCTIONS MAY RESULT IN THE CANCELLATION OF YOUR SURGERY    PATIENT SIGNATURE_________________________________  ______________________________________________________________________

## 2014-12-15 ENCOUNTER — Encounter (HOSPITAL_COMMUNITY): Payer: Self-pay

## 2014-12-15 ENCOUNTER — Encounter (HOSPITAL_COMMUNITY)
Admission: RE | Admit: 2014-12-15 | Discharge: 2014-12-15 | Disposition: A | Discharge status: Home / Self Care | Payer: Medicaid Other | Source: Ambulatory Visit | Attending: General Surgery | Admitting: General Surgery

## 2014-12-15 ENCOUNTER — Encounter: Payer: Self-pay | Admitting: *Deleted

## 2014-12-15 ENCOUNTER — Encounter (INDEPENDENT_AMBULATORY_CARE_PROVIDER_SITE_OTHER): Payer: Self-pay

## 2014-12-15 DIAGNOSIS — Z01812 Encounter for preprocedural laboratory examination: Secondary | ICD-10-CM

## 2014-12-15 HISTORY — DX: Age-related osteoporosis without current pathological fracture: M81.0

## 2014-12-15 LAB — BASIC METABOLIC PANEL
Anion gap: 4 — ABNORMAL LOW (ref 5–15)
BUN: 7 mg/dL (ref 6–23)
CO2: 26 mmol/L (ref 19–32)
Calcium: 9.4 mg/dL (ref 8.4–10.5)
Chloride: 107 mEq/L (ref 96–112)
Creatinine, Ser: 0.49 mg/dL — ABNORMAL LOW (ref 0.50–1.10)
GFR calc Af Amer: >90 mL/min (ref >90)
GFR calc non Af Amer: >90 mL/min (ref >90)
Glucose, Bld: 78 mg/dL (ref 70–99)
Potassium: 3.8 mmol/L (ref 3.5–5.1)
Sodium: 137 mmol/L (ref 135–145)

## 2014-12-15 LAB — CBC
HCT: 40.1 % (ref 36.0–46.0)
Hemoglobin: 12.9 g/dL (ref 12.0–15.0)
MCH: 28.4 pg (ref 26.0–34.0)
MCHC: 32.2 g/dL (ref 30.0–36.0)
MCV: 88.1 fL (ref 78.0–100.0)
Platelets: 312 10*3/uL (ref 150–400)
RBC: 4.55 MIL/uL (ref 3.87–5.11)
RDW: 13.6 % (ref 11.5–15.5)
WBC: 7.6 10*3/uL (ref 4.0–10.5)

## 2014-12-15 LAB — HEMOGLOBIN A1C
Hgb A1c MFr Bld: 4.8 % (ref <5.7)
Mean Plasma Glucose: 91 mg/dL (ref <117)

## 2014-12-15 LAB — HCG, SERUM, QUALITATIVE: Preg, Serum: NEGATIVE

## 2014-12-16 ENCOUNTER — Encounter: Payer: Self-pay | Admitting: Obstetrics & Gynecology

## 2014-12-16 LAB — ABO/RH: ABO/RH(D): AB POS

## 2014-12-17 NOTE — Anesthesia Preprocedure Evaluation (Signed)
Anesthesia Evaluation  Patient identified by MRN, date of birth, ID band Patient awake    Reviewed: Allergy & Precautions, H&P , NPO status , Patient's Chart, lab work & pertinent test results  History of Anesthesia Complications Negative for: history of anesthetic complications  Airway Mallampati: II  TM Distance: >3 FB Neck ROM: full    Dental no notable dental hx. (+) Teeth Intact, Dental Advisory Given   Pulmonary neg pulmonary ROS,  breath sounds clear to auscultation  Pulmonary exam normal       Cardiovascular Exercise Tolerance: Good negative cardio ROS  Rhythm:regular Rate:Normal     Neuro/Psych negative neurological ROS  negative psych ROS   GI/Hepatic Neg liver ROS, Crohn's disease   Endo/Other  negative endocrine ROS  Renal/GU negative Renal ROS  negative genitourinary   Musculoskeletal   Abdominal   Peds  Hematology negative hematology ROS (+) anemia ,   Anesthesia Other Findings crohns disease  Reproductive/Obstetrics negative OB ROS                             Anesthesia Physical Anesthesia Plan  ASA: II  Anesthesia Plan: General   Post-op Pain Management:    Induction: Intravenous  Airway Management Planned: Oral ETT  Additional Equipment:   Intra-op Plan:   Post-operative Plan: Extubation in OR  Informed Consent: I have reviewed the patients History and Physical, chart, labs and discussed the procedure including the risks, benefits and alternatives for the proposed anesthesia with the patient or authorized representative who has indicated his/her understanding and acceptance.   Dental Advisory Given  Plan Discussed with: CRNA and Surgeon  Anesthesia Plan Comments:         Anesthesia Quick Evaluation

## 2014-12-18 ENCOUNTER — Inpatient Hospital Stay (HOSPITAL_COMMUNITY): Payer: Medicaid Other | Admitting: Anesthesiology

## 2014-12-18 ENCOUNTER — Encounter (HOSPITAL_COMMUNITY): Payer: Self-pay | Admitting: *Deleted

## 2014-12-18 ENCOUNTER — Inpatient Hospital Stay (HOSPITAL_COMMUNITY)
Admission: RE | Admit: 2014-12-18 | Discharge: 2014-12-24 | DRG: 345 | Disposition: A | Discharge status: Home / Self Care | Payer: Medicaid Other | Source: Ambulatory Visit | Attending: General Surgery | Admitting: General Surgery

## 2014-12-18 ENCOUNTER — Encounter (HOSPITAL_COMMUNITY)
Admission: RE | Disposition: A | Discharge status: Home / Self Care | Payer: Self-pay | Source: Ambulatory Visit | Attending: General Surgery

## 2014-12-18 DIAGNOSIS — K660 Peritoneal adhesions (postprocedural) (postinfection): Secondary | ICD-10-CM | POA: Diagnosis present

## 2014-12-18 DIAGNOSIS — K5090 Crohn's disease, unspecified, without complications: Principal | ICD-10-CM | POA: Diagnosis present

## 2014-12-18 DIAGNOSIS — N926 Irregular menstruation, unspecified: Secondary | ICD-10-CM | POA: Diagnosis present

## 2014-12-18 DIAGNOSIS — K5289 Other specified noninfective gastroenteritis and colitis: Secondary | ICD-10-CM | POA: Diagnosis present

## 2014-12-18 DIAGNOSIS — Z432 Encounter for attention to ileostomy: Secondary | ICD-10-CM | POA: Diagnosis not present

## 2014-12-18 DIAGNOSIS — K567 Ileus, unspecified: Secondary | ICD-10-CM | POA: Diagnosis present

## 2014-12-18 HISTORY — PX: ILEOSTOMY CLOSURE: SHX1784

## 2014-12-18 LAB — TYPE AND SCREEN
ABO/RH(D): AB POS
Antibody Screen: NEGATIVE

## 2014-12-18 SURGERY — CLOSURE, ILEOSTOMY
Anesthesia: General | Site: Abdomen

## 2014-12-18 MED ORDER — ONDANSETRON HCL 4 MG PO TABS
4.0000 mg | ORAL_TABLET | Freq: Four times a day (QID) | ORAL | Status: DC | PRN
  Administered 2014-12-21 – 2014-12-23 (×2): 4 mg via ORAL
  Filled 2014-12-18 (×2): qty 1

## 2014-12-18 MED ORDER — 0.9 % SODIUM CHLORIDE (POUR BTL) OPTIME
TOPICAL | Status: DC | PRN
  Administered 2014-12-18: 1000 mL

## 2014-12-18 MED ORDER — CEFOTETAN DISODIUM-DEXTROSE 2-2.08 GM-% IV SOLR
INTRAVENOUS | Status: AC
  Filled 2014-12-18: qty 50

## 2014-12-18 MED ORDER — HYDROMORPHONE HCL 1 MG/ML IJ SOLN
0.2500 mg | INTRAMUSCULAR | Status: DC | PRN
  Administered 2014-12-18 (×4): 0.5 mg via INTRAVENOUS

## 2014-12-18 MED ORDER — SULFASALAZINE 500 MG PO TABS
500.0000 mg | ORAL_TABLET | Freq: Two times a day (BID) | ORAL | Status: DC
  Administered 2014-12-18 – 2014-12-24 (×12): 500 mg via ORAL
  Filled 2014-12-18 (×14): qty 1

## 2014-12-18 MED ORDER — SUCCINYLCHOLINE CHLORIDE 20 MG/ML IJ SOLN
INTRAMUSCULAR | Status: DC | PRN
  Administered 2014-12-18: 100 mg via INTRAVENOUS

## 2014-12-18 MED ORDER — DEXAMETHASONE SODIUM PHOSPHATE 10 MG/ML IJ SOLN
INTRAMUSCULAR | Status: DC | PRN
Start: 2014-12-18 — End: 2014-12-18
  Administered 2014-12-18: 10 mg via INTRAVENOUS

## 2014-12-18 MED ORDER — LACTATED RINGERS IV SOLN
INTRAVENOUS | Status: DC
Start: 2014-12-18 — End: 2014-12-18

## 2014-12-18 MED ORDER — KETOROLAC TROMETHAMINE 30 MG/ML IJ SOLN
INTRAMUSCULAR | Status: AC
  Filled 2014-12-18: qty 1

## 2014-12-18 MED ORDER — NEOSTIGMINE METHYLSULFATE 10 MG/10ML IV SOLN
INTRAVENOUS | Status: AC
  Filled 2014-12-18: qty 1

## 2014-12-18 MED ORDER — DEXTROSE 5 % IV SOLN
2.0000 g | Freq: Two times a day (BID) | INTRAVENOUS | Status: AC
  Administered 2014-12-18: 2 g via INTRAVENOUS
  Filled 2014-12-18: qty 2

## 2014-12-18 MED ORDER — DIPHENHYDRAMINE HCL 50 MG/ML IJ SOLN
25.0000 mg | Freq: Four times a day (QID) | INTRAMUSCULAR | Status: DC | PRN
Start: 2014-12-18 — End: 2014-12-24

## 2014-12-18 MED ORDER — ROCURONIUM BROMIDE 100 MG/10ML IV SOLN
INTRAVENOUS | Status: AC
  Filled 2014-12-18: qty 1

## 2014-12-18 MED ORDER — GLYCOPYRROLATE 0.2 MG/ML IJ SOLN
INTRAMUSCULAR | Status: AC
  Filled 2014-12-18: qty 3

## 2014-12-18 MED ORDER — HYDROMORPHONE HCL 1 MG/ML IJ SOLN
INTRAMUSCULAR | Status: AC
  Filled 2014-12-18: qty 1

## 2014-12-18 MED ORDER — MIDAZOLAM HCL 2 MG/2ML IJ SOLN
INTRAMUSCULAR | Status: AC
  Filled 2014-12-18: qty 2

## 2014-12-18 MED ORDER — KETOROLAC TROMETHAMINE 30 MG/ML IJ SOLN
INTRAMUSCULAR | Status: DC | PRN
  Administered 2014-12-18: 30 mg via INTRAVENOUS

## 2014-12-18 MED ORDER — DEXTROSE 5 % IV SOLN
2.0000 g | INTRAVENOUS | Status: AC
  Administered 2014-12-18: 2 g via INTRAVENOUS

## 2014-12-18 MED ORDER — ONDANSETRON HCL 4 MG/2ML IJ SOLN
4.0000 mg | Freq: Four times a day (QID) | INTRAMUSCULAR | Status: DC | PRN
  Administered 2014-12-18 – 2014-12-23 (×9): 4 mg via INTRAVENOUS
  Filled 2014-12-18 (×9): qty 2

## 2014-12-18 MED ORDER — ALVIMOPAN 12 MG PO CAPS
12.0000 mg | ORAL_CAPSULE | Freq: Two times a day (BID) | ORAL | Status: DC
  Administered 2014-12-19 – 2014-12-22 (×7): 12 mg via ORAL
  Filled 2014-12-18 (×8): qty 1

## 2014-12-18 MED ORDER — NEOSTIGMINE METHYLSULFATE 10 MG/10ML IV SOLN
INTRAVENOUS | Status: DC | PRN
  Administered 2014-12-18: 4 mg via INTRAVENOUS

## 2014-12-18 MED ORDER — MORPHINE SULFATE 2 MG/ML IJ SOLN
2.0000 mg | INTRAMUSCULAR | Status: DC | PRN
  Administered 2014-12-18: 2 mg via INTRAVENOUS
  Administered 2014-12-18: 4 mg via INTRAVENOUS
  Administered 2014-12-18: 2 mg via INTRAVENOUS
  Filled 2014-12-18 (×2): qty 1
  Filled 2014-12-18 (×2): qty 2

## 2014-12-18 MED ORDER — HYDROMORPHONE HCL 1 MG/ML IJ SOLN
0.5000 mg | INTRAMUSCULAR | Status: DC | PRN
  Administered 2014-12-18: 0.5 mg via INTRAVENOUS
  Administered 2014-12-18 – 2014-12-21 (×17): 1 mg via INTRAVENOUS
  Administered 2014-12-21: 0.5 mg via INTRAVENOUS
  Administered 2014-12-21 – 2014-12-23 (×6): 1 mg via INTRAVENOUS
  Filled 2014-12-18 (×26): qty 1

## 2014-12-18 MED ORDER — LACTATED RINGERS IV SOLN
INTRAVENOUS | Status: DC
  Administered 2014-12-19: 16:00:00 via INTRAVENOUS
  Administered 2014-12-20: 1000 mL via INTRAVENOUS
  Administered 2014-12-20 – 2014-12-22 (×2): via INTRAVENOUS
  Administered 2014-12-23: 100 mL/h via INTRAVENOUS

## 2014-12-18 MED ORDER — ONDANSETRON HCL 4 MG/2ML IJ SOLN
INTRAMUSCULAR | Status: AC
  Filled 2014-12-18: qty 2

## 2014-12-18 MED ORDER — ONDANSETRON HCL 4 MG/2ML IJ SOLN
INTRAMUSCULAR | Status: DC | PRN
  Administered 2014-12-18: 4 mg via INTRAVENOUS

## 2014-12-18 MED ORDER — MIDAZOLAM HCL 5 MG/5ML IJ SOLN
INTRAMUSCULAR | Status: DC | PRN
  Administered 2014-12-18: 2 mg via INTRAVENOUS

## 2014-12-18 MED ORDER — OXYCODONE-ACETAMINOPHEN 5-325 MG PO TABS
1.0000 | ORAL_TABLET | ORAL | Status: DC | PRN
  Administered 2014-12-19 – 2014-12-24 (×22): 2 via ORAL
  Filled 2014-12-18 (×22): qty 2

## 2014-12-18 MED ORDER — ACETAMINOPHEN 500 MG PO TABS
1000.0000 mg | ORAL_TABLET | Freq: Four times a day (QID) | ORAL | Status: AC
  Administered 2014-12-18 – 2014-12-19 (×4): 1000 mg via ORAL
  Filled 2014-12-18 (×4): qty 2

## 2014-12-18 MED ORDER — PROMETHAZINE HCL 25 MG/ML IJ SOLN
12.5000 mg | Freq: Four times a day (QID) | INTRAMUSCULAR | Status: DC | PRN
  Administered 2014-12-18: 12.5 mg via INTRAVENOUS
  Filled 2014-12-18: qty 1

## 2014-12-18 MED ORDER — KETOROLAC TROMETHAMINE 30 MG/ML IJ SOLN
30.0000 mg | Freq: Four times a day (QID) | INTRAMUSCULAR | Status: AC
  Administered 2014-12-18 – 2014-12-19 (×4): 30 mg via INTRAVENOUS
  Filled 2014-12-18 (×3): qty 1

## 2014-12-18 MED ORDER — ENOXAPARIN SODIUM 40 MG/0.4ML ~~LOC~~ SOLN
40.0000 mg | SUBCUTANEOUS | Status: DC
  Administered 2014-12-19 – 2014-12-24 (×6): 40 mg via SUBCUTANEOUS
  Filled 2014-12-18 (×8): qty 0.4

## 2014-12-18 MED ORDER — FOLIC ACID 1 MG PO TABS
1.0000 mg | ORAL_TABLET | Freq: Every day | ORAL | Status: DC
  Administered 2014-12-19 – 2014-12-24 (×6): 1 mg via ORAL
  Filled 2014-12-18 (×6): qty 1

## 2014-12-18 MED ORDER — ACETAMINOPHEN 10 MG/ML IV SOLN
1000.0000 mg | Freq: Once | INTRAVENOUS | Status: AC
  Administered 2014-12-18: 1000 mg via INTRAVENOUS
  Filled 2014-12-18: qty 100

## 2014-12-18 MED ORDER — PROPOFOL 10 MG/ML IV BOLUS
INTRAVENOUS | Status: DC | PRN
  Administered 2014-12-18: 150 mg via INTRAVENOUS

## 2014-12-18 MED ORDER — DIPHENHYDRAMINE HCL 25 MG PO CAPS: 25.0000 mg | ORAL_CAPSULE | Freq: Four times a day (QID) | ORAL | Status: DC | PRN

## 2014-12-18 MED ORDER — ALVIMOPAN 12 MG PO CAPS
12.0000 mg | ORAL_CAPSULE | Freq: Once | ORAL | Status: AC
  Administered 2014-12-18: 12 mg via ORAL
  Filled 2014-12-18: qty 1

## 2014-12-18 MED ORDER — FENTANYL CITRATE 0.05 MG/ML IJ SOLN
INTRAMUSCULAR | Status: DC | PRN
  Administered 2014-12-18 (×5): 50 ug via INTRAVENOUS

## 2014-12-18 MED ORDER — FENTANYL CITRATE 0.05 MG/ML IJ SOLN
INTRAMUSCULAR | Status: AC
  Filled 2014-12-18: qty 5

## 2014-12-18 MED ORDER — LACTATED RINGERS IV SOLN
INTRAVENOUS | Status: DC | PRN
  Administered 2014-12-18: 07:00:00 via INTRAVENOUS

## 2014-12-18 MED ORDER — LACTATED RINGERS IV SOLN
INTRAVENOUS | Status: DC
  Administered 2014-12-18: 11:00:00 via INTRAVENOUS
  Administered 2014-12-19: 75 via INTRAVENOUS

## 2014-12-18 MED ORDER — DEXAMETHASONE SODIUM PHOSPHATE 10 MG/ML IJ SOLN
INTRAMUSCULAR | Status: AC
  Filled 2014-12-18: qty 1

## 2014-12-18 MED ORDER — PROPOFOL 10 MG/ML IV BOLUS
INTRAVENOUS | Status: AC
  Filled 2014-12-18: qty 20

## 2014-12-18 MED ORDER — FENTANYL CITRATE 0.05 MG/ML IJ SOLN
25.0000 ug | INTRAMUSCULAR | Status: AC | PRN
  Administered 2014-12-18 (×2): 25 ug via INTRAVENOUS

## 2014-12-18 MED ORDER — ROCURONIUM BROMIDE 100 MG/10ML IV SOLN
INTRAVENOUS | Status: DC | PRN
  Administered 2014-12-18: 20 mg via INTRAVENOUS
  Administered 2014-12-18: 5 mg via INTRAVENOUS

## 2014-12-18 MED ORDER — FENTANYL CITRATE 0.05 MG/ML IJ SOLN
INTRAMUSCULAR | Status: AC
  Filled 2014-12-18: qty 2

## 2014-12-18 MED ORDER — ALUM & MAG HYDROXIDE-SIMETH 200-200-20 MG/5ML PO SUSP: 30.0000 mL | Freq: Four times a day (QID) | ORAL | Status: DC | PRN

## 2014-12-18 MED ORDER — NORETHINDRONE ACET-ETHINYL EST 1-20 MG-MCG PO TABS: 1.0000 | ORAL_TABLET | Freq: Every day | ORAL | Status: DC

## 2014-12-18 MED ORDER — GLYCOPYRROLATE 0.2 MG/ML IJ SOLN
INTRAMUSCULAR | Status: DC | PRN
  Administered 2014-12-18: 0.6 mg via INTRAVENOUS

## 2014-12-18 SURGICAL SUPPLY — 42 items
BLADE HEX COATED 2.75 (ELECTRODE) ×4 IMPLANT
CHLORAPREP W/TINT 26ML (MISCELLANEOUS) ×2 IMPLANT
COVER MAYO STAND STRL (DRAPES) ×2 IMPLANT
DRAPE LAPAROSCOPIC ABDOMINAL (DRAPES) ×2 IMPLANT
DRAPE UTILITY XL STRL (DRAPES) ×2 IMPLANT
DRAPE WARM FLUID 44X44 (DRAPE) ×2 IMPLANT
ELECT REM PT RETURN 9FT ADLT (ELECTROSURGICAL) ×2
ELECTRODE REM PT RTRN 9FT ADLT (ELECTROSURGICAL) ×1 IMPLANT
GAUZE SPONGE 4X4 12PLY STRL (GAUZE/BANDAGES/DRESSINGS) ×2 IMPLANT
GLOVE BIO SURGEON STRL SZ 6.5 (GLOVE) ×4 IMPLANT
GLOVE BIOGEL PI IND STRL 7.0 (GLOVE) ×1 IMPLANT
GLOVE BIOGEL PI INDICATOR 7.0 (GLOVE) ×1
GOWN L4 XXLG W/PAP TWL (GOWN DISPOSABLE) ×2 IMPLANT
GOWN SPEC L4 XLG W/TWL (GOWN DISPOSABLE) ×4 IMPLANT
GOWN STRL REUS W/TWL XL LVL3 (GOWN DISPOSABLE) ×2 IMPLANT
KIT BASIN OR (CUSTOM PROCEDURE TRAY) ×2 IMPLANT
LIGASURE IMPACT 36 18CM CVD LR (INSTRUMENTS) IMPLANT
MANIFOLD NEPTUNE II (INSTRUMENTS) ×2 IMPLANT
PACK GENERAL/GYN (CUSTOM PROCEDURE TRAY) ×2 IMPLANT
RELOAD PROXIMATE 75MM BLUE (ENDOMECHANICALS) ×2 IMPLANT
RELOAD STAPLE 75 3.8 BLU REG (ENDOMECHANICALS) IMPLANT
SPONGE LAP 18X18 X RAY DECT (DISPOSABLE) IMPLANT
STAPLER GUN LINEAR PROX 60 (STAPLE) ×1 IMPLANT
STAPLER PROXIMATE 75MM BLUE (STAPLE) ×1 IMPLANT
STAPLER VISISTAT 35W (STAPLE) ×2 IMPLANT
SUCTION POOLE TIP (SUCTIONS) ×2 IMPLANT
SUT NOVA NAB GS-21 1 T12 (SUTURE) ×2 IMPLANT
SUT PDS AB 1 CTX 36 (SUTURE) IMPLANT
SUT PDS AB 1 TP1 96 (SUTURE) IMPLANT
SUT PROLENE 2 0 BLUE (SUTURE) IMPLANT
SUT SILK 2 0 (SUTURE)
SUT SILK 2 0 SH CR/8 (SUTURE) ×1 IMPLANT
SUT SILK 2 0SH CR/8 30 (SUTURE) IMPLANT
SUT SILK 2-0 18XBRD TIE 12 (SUTURE) IMPLANT
SUT SILK 2-0 30XBRD TIE 12 (SUTURE) IMPLANT
SUT SILK 3 0 (SUTURE) ×2
SUT SILK 3 0 SH CR/8 (SUTURE) ×2 IMPLANT
SUT SILK 3-0 18XBRD TIE 12 (SUTURE) IMPLANT
SUT VIC AB 2-0 SH 27 (SUTURE) ×2
SUT VIC AB 2-0 SH 27X BRD (SUTURE) IMPLANT
TOWEL OR 17X26 10 PK STRL BLUE (TOWEL DISPOSABLE) ×4 IMPLANT
TOWEL OR NON WOVEN STRL DISP B (DISPOSABLE) ×4 IMPLANT

## 2014-12-18 NOTE — Progress Notes (Signed)
Dr Excell Seltzer paged regarding patient pain unrelieved by current pain medications.  New orders received.

## 2014-12-18 NOTE — Anesthesia Postprocedure Evaluation (Signed)
  Anesthesia Post-op Note  Patient: Jessica [name]  Procedure(s) Performed: Procedure(s): LOOP ILEOSTOMY REVERSAL (N/A)  Patient Location: PACU  Anesthesia Type:General  Level of Consciousness: awake, alert  and oriented  Airway and Oxygen Therapy: Patient Spontanous Breathing and Patient connected to nasal cannula oxygen  Post-op Pain: moderate  Post-op Assessment: Post-op Vital signs reviewed, Patient's Cardiovascular Status Stable and Patent Airway  Post-op Vital Signs: Reviewed and stable  Last Vitals:  Filed Vitals:   12/18/14 0915  BP: 129/87  Pulse: 78  Temp:   Resp: 13    Complications: No apparent anesthesia complications

## 2014-12-18 NOTE — Interval H&P Note (Signed)
History and Physical Interval Note:  12/18/2014 7:24 AM  Jessica [name]  has presented today for surgery, with the diagnosis of crohns disease  The various methods of treatment have been discussed with the patient and family. After consideration of risks, benefits and other options for treatment, the patient has consented to  Procedure(s): LOOP ILEOSTOMY REVERSAL (N/A) as a surgical intervention .  The patient's history has been reviewed, patient examined, no change in status, stable for surgery.  I have reviewed the patient's chart and labs.  Questions were answered to the patient's satisfaction.     Rosario Adie, MD  [facility]

## 2014-12-18 NOTE — Transfer of Care (Signed)
Immediate Anesthesia Transfer of Care Note  Patient: Jessica [name]  Procedure(s) Performed: Procedure(s): LOOP ILEOSTOMY REVERSAL (N/A)  Patient Location: PACU  Anesthesia Type:General  Level of Consciousness: awake, alert , oriented and patient cooperative  Airway & Oxygen Therapy: Patient Spontanous Breathing and Patient connected to face mask oxygen  Post-op Assessment: Report given to PACU RN and Post -op Vital signs reviewed and stable  Post vital signs: Reviewed and stable  Complications: No apparent anesthesia complications

## 2014-12-18 NOTE — Op Note (Signed)
12/18/2014  8:39 AM  PATIENT:  Jessica [name]  18 y.o. female  Patient Care Team: No Pcp Per Patient as PCP - General (General Practice)  PRE-OPERATIVE DIAGNOSIS:  crohns disease, diversion colitis  POST-OPERATIVE DIAGNOSIS:  crohns disease, diversion colitis  PROCEDURE:  LOOP ILEOSTOMY REVERSAL    Surgeon(s): [name], MD  ASSISTANT: none   ANESTHESIA:   general  EBL:  Total I/O In: 700 [I.V.:700] Out: 150 [Urine:100; Blood:50]  DRAINS: none   SPECIMEN:  Source of Specimen:  ileostomy  DISPOSITION OF SPECIMEN:  PATHOLOGY  COUNTS:  YES  PLAN OF CARE: Admit to inpatient   PATIENT DISPOSITION:  PACU - hemodynamically stable.  INDICATION: This is a 18 year old female who is status post ileocolonic resection and diverting loop ileostomy approximate 4 years ago for severe Crohn's disease with malnourishment. Since that time she has recovered well. She then began to develop rectal bleeding. 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 On CT scan the ileocolonic anastomosis appears patent and well-healed. I have reviewed her operative records from her surgery that was done at [facility]. We have decided to reverse her ileostomy at this time.   OR FINDINGS: Normal-appearing loop ileostomy. No signs of inflammation which would suggest Crohn's disease.  DESCRIPTION: the patient was identified in the preoperative holding area and taken to the OR where they were laid supine on the operating room table.  General anesthesia was induced without difficulty. SCDs were also noted to be in place prior to the initiation of anesthesia.  The patient was then prepped and draped in the usual sterile fashion.   A surgical timeout was performed indicating the correct patient, procedure, positioning and need for preoperative antibiotics.   I began by making a circular incision around the ileostomy. This was done using electrocautery.  Dissection was carried down through the subcutaneous tissues around the loop ileostomy site down to the level of the fascia. The fascia was then separated from the loops of bowel and all adhesions were cleared from the abdominal wall. I then chose a portion of noninflamed ileum and made 2 small enterotomies using Bovie light cautery. A 75 mm GIA blue load stapler was used to create anastomosis. A TA stapler was used to close the common enterotomy channel. I used a 75 mm GIA stapler reload to remove the ileostomy. After this the edges of the staple line were oversewn using interrupted 3-0 silk sutures. An anti-tension suture was placed at the other end of the anastomosis. The anastomosis appeared widely patent. There was no anastomotic bleeding from the staple line noted. I then placed this back into the abdomen and closed the fascia using interrupted #1 Novafil sutures. I used a pursestring 2-0 Vicryl suture to close the subcutaneous tissues over the fascia closer and a second pursestring suture to close the dermal layer partially. I then placed a Tefla wick into the middle portion of the wound.  A sterile dressing was applied. The patient was awakened from anesthesia and sent to the postanesthesia care unit in stable condition. All counts were correct per operating room staff.

## 2014-12-18 NOTE — H&P (View-Only) (Signed)
Jessica [name] 12/01/2014 2:40 PM Location: [facility] Patient #: [id number] DOB: [birth year] Single / Language: Jessica [name] / Race: Black or African American Female History of Present Illness [name] MD; 90/24/0973 3:04 PM) Patient words: diversion.  The patient is a 18 year old female who presents with Crohn's disease. This is a 18 year old female who underwent a ileocecectomy and diverting loop ileostomy approximately 4 years ago due to a ileocolonic flare of her Crohn's disease. She was given a loop ileostomy to protect a ileocolonic anastomosis. Her hospitalization was complicated by wound infection. Since then she has had no difficulty with her ostomy. She reports good function and denies abdominal pain or bleeding in her stool. She was recently hospitalized due to rectal bleeding. A colonoscopy was performed which showed what appeared to be diversion colitis. A CT scan was also performed which showed some thickening of the bowel wall. The CT scan also showed the anastomosis was widely patent. She is on a steroid enema right now and is had much less bleeding. Other Problems Marjean Donna, [place]; 12/01/2014 2:41 PM) Crohn's Disease Other disease, cancer, significant illness  Past Surgical History Marjean Donna, [place]; 12/01/2014 2:41 PM) Colon Removal - Partial Resection of Small Bowel  Diagnostic Studies History Marjean Donna, CMA; 12/01/2014 2:41 PM) Colonoscopy 1-5 years ago Mammogram never Pap Smear 1-5 years ago  Allergies Marjean Donna, CMA; 12/01/2014 2:42 PM) No Known Drug Allergies 12/01/2014  Medication History (Sonya Bynum, CMA; 12/01/2014 2:43 PM) Tylenol Extra Strength (500MG Tablet, Oral) Active. Bentyl (10MG/ML Solution, Intramuscular) Active. Cortenema (100MG/60ML Enema, Rectal) Active. Loestrin Fe 1/20 (1-20MG-MCG Tablet, Oral) Active. Zofran ODT (4MG Tablet Disperse, Oral as needed) Active. Oxycodone-Acetaminophen (5-325MG Tablet,  Oral as needed) Active.  Social History ([place]; 12/01/2014 2:41 PM) Caffeine use Carbonated beverages, Coffee, Tea. No alcohol use No drug use Tobacco use Never smoker.  Family History Marjean Donna, [place]; 12/01/2014 2:41 PM) Diabetes Mellitus Father. Heart disease in female family member before age 68 Ischemic Bowel Disease Mother. Migraine Headache Mother.  Pregnancy / Birth History Marjean Donna, [place]; 12/01/2014 2:41 PM) Age at menarche 10 years. Gravida 2 Irregular periods Maternal age 75-20 Para 1     Review of Systems ([place]; 12/01/2014 2:41 PM) General Not Present- Appetite Loss, Chills, Fatigue, Fever, Night Sweats, Weight Gain and Weight Loss. Skin Present- Dryness. Not Present- Change in Wart/Mole, Hives, Jaundice, New Lesions, Non-Healing Wounds, Rash and Ulcer. HEENT Present- Wears glasses/contact lenses. Not Present- Earache, Hearing Loss, Hoarseness, Nose Bleed, Oral Ulcers, Ringing in the Ears, Seasonal Allergies, Sinus Pain, Sore Throat, Visual Disturbances and Yellow Eyes. Respiratory Not Present- Bloody sputum, Chronic Cough, Difficulty Breathing, Snoring and Wheezing. Breast Not Present- Breast Mass, Breast Pain, Nipple Discharge and Skin Changes. Cardiovascular Not Present- Chest Pain, Difficulty Breathing Lying Down, Leg Cramps, Palpitations, Rapid Heart Rate, Shortness of Breath and Swelling of Extremities. Gastrointestinal Present- Abdominal Pain, Bloating, Bloody Stool and Nausea. Not Present- Change in Bowel Habits, Chronic diarrhea, Constipation, Difficulty Swallowing, Excessive gas, Gets full quickly at meals, Hemorrhoids, Indigestion, Rectal Pain and Vomiting. Musculoskeletal Present- Joint Pain and Muscle Pain. Not Present- Back Pain, Joint Stiffness, Muscle Weakness and Swelling of Extremities. Neurological Present- Headaches. Not Present- Decreased Memory, Fainting, Numbness, Seizures, Tingling, Tremor, Trouble walking and  Weakness. Psychiatric Not Present- Anxiety, Bipolar, Change in Sleep Pattern, Depression, Fearful and Frequent crying. Endocrine Not Present- Cold Intolerance, Excessive Hunger, Hair Changes, Heat Intolerance, Hot flashes and New Diabetes. Hematology Not Present- Easy Bruising, Excessive bleeding,  Gland problems, HIV and Persistent Infections.  Vitals (Sonya Bynum CMA; 12/01/2014 2:41 PM) 12/01/2014 2:41 PM Weight: 160 lb Height: 66in Body Surface Area: 1.84 m Body Mass Index: 25.82 kg/m Temp.: 97.20F(Temporal)  Pulse: 77 (Regular)  BP: 126/74 (Sitting, Left Arm, Standard)     Physical Exam [name] MD; 91/91/6606 3:08 PM)  General Mental Status-Alert. General Appearance-Consistent with stated age. Hydration-Well hydrated. Voice-Normal.  Head and Neck Head-normocephalic, atraumatic with no lesions or palpable masses. Trachea-midline. Thyroid Gland Characteristics - normal size and consistency.  Eye Eyeball - Bilateral-Extraocular movements intact. Sclera/Conjunctiva - Bilateral-No scleral icterus.  Chest and Lung Exam Chest and lung exam reveals -quiet, even and easy respiratory effort with no use of accessory muscles and on auscultation, normal breath sounds, no adventitious sounds and normal vocal resonance. Inspection Chest Wall - Normal. Back - normal.  Breast Breast - Left-Symmetric, Non Tender, No Biopsy scars, no Dimpling, No Inflammation, No Lumpectomy scars, No Mastectomy scars, No Peau d' Orange. Breast - Right-Symmetric, Non Tender, No Biopsy scars, no Dimpling, No Inflammation, No Lumpectomy scars, No Mastectomy scars, No Peau d' Orange. Breast Lump-No Palpable Breast Mass.  Cardiovascular Cardiovascular examination reveals -normal heart sounds, regular rate and rhythm with no murmurs and normal pedal pulses bilaterally.  Abdomen Inspection  Inspection of the abdomen reveals: Note: loop ileostomy, pink,  viable. Skin - Scar - Note: lower midline scar. Palpation/Percussion Palpation and Percussion of the abdomen reveal - Soft, Non Tender, No Rebound tenderness, No Rigidity (guarding) and No hepatosplenomegaly. Auscultation Auscultation of the abdomen reveals - Bowel sounds normal.  Neurologic Neurologic evaluation reveals -alert and oriented x 3 with no impairment of recent or remote memory. Mental Status-Normal.  Musculoskeletal Normal Exam - Left-Upper Extremity Strength Normal and Lower Extremity Strength Normal. Normal Exam - Right-Upper Extremity Strength Normal and Lower Extremity Strength Normal.    Assessment & Plan [name] MD; 00/45/9977 3:06 PM)  CROHN'S DISEASE IN REMISSION (555.9  K50.84) Story: 18 year old female with Crohn's disease in remission. Patient with diversion colitis. Impression: Recommended takedown of her loop ileostomy to help with her diversion colitis. I think she will tolerate this well. I will discuss with Dr. Olevia Perches about starting a maintenance dose Crohn's therapy to help prevent a Crohn's flare perioperatively. The surgery and anatomy were described to the patient as well as the risks of surgery and the possible complications. These include: Bleeding, deep abdominal infections and possible wound complications such as hernia and infection, damage to adjacent structures, leak of surgical connections, which can lead to other surgeries and possibly an ostomy, possible need for other procedures, such as abscess drains in radiology, possible prolonged hospital stay, possible constipation from narcotics, prolonged fatigue/weakness or appetite loss, possible early recurrence of of disease, possible complications of their medical problems such as heart disease or arrhythmias or lung problems, death (less than 1%). I believe the patient understands and wishes to proceed with the surgery.

## 2014-12-19 LAB — CBC
HCT: 32.1 % — ABNORMAL LOW (ref 36.0–46.0)
Hemoglobin: 10.5 g/dL — ABNORMAL LOW (ref 12.0–15.0)
MCH: 28.5 pg (ref 26.0–34.0)
MCHC: 32.7 g/dL (ref 30.0–36.0)
MCV: 87.2 fL (ref 78.0–100.0)
Platelets: 240 10*3/uL (ref 150–400)
RBC: 3.68 MIL/uL — ABNORMAL LOW (ref 3.87–5.11)
RDW: 13.3 % (ref 11.5–15.5)
WBC: 10.8 10*3/uL — ABNORMAL HIGH (ref 4.0–10.5)

## 2014-12-19 LAB — BASIC METABOLIC PANEL
Anion gap: 5 (ref 5–15)
BUN: 8 mg/dL (ref 6–23)
CO2: 27 mmol/L (ref 19–32)
Calcium: 8.6 mg/dL (ref 8.4–10.5)
Chloride: 106 mEq/L (ref 96–112)
Creatinine, Ser: 0.57 mg/dL (ref 0.50–1.10)
GFR calc Af Amer: >90 mL/min (ref >90)
GFR calc non Af Amer: >90 mL/min (ref >90)
Glucose, Bld: 90 mg/dL (ref 70–99)
Potassium: 3.6 mmol/L (ref 3.5–5.1)
Sodium: 138 mmol/L (ref 135–145)

## 2014-12-19 NOTE — Progress Notes (Signed)
Patient ID: Jessica [name], female   DOB: 1996-08-04, 19 y.o.   MRN: [id number] 1 Day Post-Op  Subjective: Having pain around the surgical site but better this morning with change in pain meds. Was nauseated yesterday but not this morning. Voiding okay. No flatus or bowel movement.  Objective: Vital signs in last 24 hours: Temp:  [97.4 F (36.3 C)-98.4 F (36.9 C)] 98 F (36.7 C) (01/01 0605) Pulse Rate:  [60-101] 75 (01/01 0605) Resp:  [11-18] 16 (01/01 0605) BP: (112-164)/(53-94) 114/73 mmHg (01/01 0605) SpO2:  [99 %-100 %] 100 % (01/01 0605) Last BM Date: 12/18/14  Intake/Output from previous day: 12/31 0701 - 01/01 0700 In: 2433.8 [I.V.:2383.8; IV Piggyback:50] Out: 1050 [Urine:1000; Blood:50] Intake/Output this shift:    General appearance: alert, cooperative and no distress GI: mild appropriate tenderness around the incision site. Generally  soft and nontender and nondistended Incision/Wound: dressing dry and intact  Lab Results:   Recent Labs  12/19/14 0520  WBC 10.8*  HGB 10.5*  HCT 32.1*  PLT 240   BMET  Recent Labs  12/19/14 0520  NA 138  K 3.6  CL 106  CO2 27  GLUCOSE 90  BUN 8  CREATININE 0.57  CALCIUM 8.6     Studies/Results: No results found.  Anti-infectives: Anti-infectives    Start     Dose/Rate Route Frequency Ordered Stop   12/18/14 2000  cefoTEtan (CEFOTAN) 2 g in dextrose 5 % 50 mL IVPB     2 g100 mL/hr over 30 Minutes Intravenous Every 12 hours 12/18/14 1013 12/18/14 2148   12/18/14 0512  cefoTEtan (CEFOTAN) 2 g in dextrose 5 % 50 mL IVPB     2 g100 mL/hr over 30 Minutes Intravenous On call to O.R. 12/18/14 3646 12/18/14 0737      Assessment/Plan: s/p Procedure(s): LOOP ILEOSTOMY REVERSAL Stable postoperatively. Start clear liquid diet Ambulation encouraged   LOS: 1 day    Page Pucciarelli T 12/19/2014

## 2014-12-20 LAB — BASIC METABOLIC PANEL
Anion gap: 7 (ref 5–15)
BUN: <5 mg/dL — ABNORMAL LOW (ref 6–23)
CO2: 29 mmol/L (ref 19–32)
Calcium: 8.3 mg/dL — ABNORMAL LOW (ref 8.4–10.5)
Chloride: 104 mEq/L (ref 96–112)
Creatinine, Ser: 0.52 mg/dL (ref 0.50–1.10)
GFR calc Af Amer: >90 mL/min (ref >90)
GFR calc non Af Amer: >90 mL/min (ref >90)
Glucose, Bld: 90 mg/dL (ref 70–99)
Potassium: 3.3 mmol/L — ABNORMAL LOW (ref 3.5–5.1)
Sodium: 140 mmol/L (ref 135–145)

## 2014-12-20 LAB — CBC
HCT: 31.8 % — ABNORMAL LOW (ref 36.0–46.0)
Hemoglobin: 10.2 g/dL — ABNORMAL LOW (ref 12.0–15.0)
MCH: 28.4 pg (ref 26.0–34.0)
MCHC: 32.1 g/dL (ref 30.0–36.0)
MCV: 88.6 fL (ref 78.0–100.0)
Platelets: 243 10*3/uL (ref 150–400)
RBC: 3.59 MIL/uL — ABNORMAL LOW (ref 3.87–5.11)
RDW: 13.5 % (ref 11.5–15.5)
WBC: 8.5 10*3/uL (ref 4.0–10.5)

## 2014-12-20 NOTE — Progress Notes (Signed)
Patient ID: NICOLETTE GIESKE, female   DOB: 10/18/1996, 19 y.o.   MRN: [id number] 2 Days Post-Op  Subjective: Having burning pain around the ileostomy site. Tolerating clear liquids without nausea. He has been walking in the hall. Having some flatus but no bowel movement.  Objective: Vital signs in last 24 hours: Temp:  [98.2 F (36.8 C)-99.3 F (37.4 C)] 98.2 F (36.8 C) (01/02 4715) Pulse Rate:  [69-86] 86 (01/02 0638) Resp:  [14-16] 16 (01/02 9539) BP: (118-122)/(68-96) 122/96 mmHg (01/02 0638) SpO2:  [98 %-100 %] 100 % (01/02 6728) Last BM Date: 12/18/14  Intake/Output from previous day: 01/01 0701 - 01/02 0700 In: 2163.8 [P.O.:340; I.V.:1823.8] Out: 2125 [Urine:2125] Intake/Output this shift:    General appearance: alert, cooperative and no distress GI: tender around surgical site only. Incision/Wound: wound is clean without erythema or unusual drainage  Lab Results:   Recent Labs  12/19/14 0520 12/20/14 0450  WBC 10.8* 8.5  HGB 10.5* 10.2*  HCT 32.1* 31.8*  PLT 240 243   BMET  Recent Labs  12/19/14 0520 12/20/14 0450  NA 138 140  K 3.6 3.3*  CL 106 104  CO2 27 29  GLUCOSE 90 90  BUN 8 <5*  CREATININE 0.57 0.52  CALCIUM 8.6 8.3*     Studies/Results: No results found.  Anti-infectives: Anti-infectives    Start     Dose/Rate Route Frequency Ordered Stop   12/18/14 2000  cefoTEtan (CEFOTAN) 2 g in dextrose 5 % 50 mL IVPB     2 g100 mL/hr over 30 Minutes Intravenous Every 12 hours 12/18/14 1013 12/18/14 2148   12/18/14 0512  cefoTEtan (CEFOTAN) 2 g in dextrose 5 % 50 mL IVPB     2 g100 mL/hr over 30 Minutes Intravenous On call to O.R. 12/18/14 9791 12/18/14 0737      Assessment/Plan: s/p Procedure(s): LOOP ILEOSTOMY REVERSAL Doing well without apparent complication. Ambulation encouraged. Advanced to full liquid diet   LOS: 2 days    Lilly Gasser T 12/20/2014

## 2014-12-21 LAB — BASIC METABOLIC PANEL
Anion gap: 5 (ref 5–15)
BUN: <5 mg/dL — ABNORMAL LOW (ref 6–23)
CO2: 32 mmol/L (ref 19–32)
Calcium: 8.3 mg/dL — ABNORMAL LOW (ref 8.4–10.5)
Chloride: 98 mEq/L (ref 96–112)
Creatinine, Ser: 0.58 mg/dL (ref 0.50–1.10)
GFR calc Af Amer: >90 mL/min (ref >90)
GFR calc non Af Amer: >90 mL/min (ref >90)
Glucose, Bld: 83 mg/dL (ref 70–99)
Potassium: 3.1 mmol/L — ABNORMAL LOW (ref 3.5–5.1)
Sodium: 135 mmol/L (ref 135–145)

## 2014-12-21 LAB — CBC
HCT: 36.9 % (ref 36.0–46.0)
Hemoglobin: 11.6 g/dL — ABNORMAL LOW (ref 12.0–15.0)
MCH: 28.2 pg (ref 26.0–34.0)
MCHC: 31.4 g/dL (ref 30.0–36.0)
MCV: 89.8 fL (ref 78.0–100.0)
Platelets: 281 10*3/uL (ref 150–400)
RBC: 4.11 MIL/uL (ref 3.87–5.11)
RDW: 13.5 % (ref 11.5–15.5)
WBC: 10.4 10*3/uL (ref 4.0–10.5)

## 2014-12-21 NOTE — Progress Notes (Signed)
Patient ID: Jessica [name], female   DOB: [birth year], 19 y.o.   MRN: [id number] 3 Days Post-Op  Subjective: Pain is gradually better. She has some nausea but no vomiting. Sipping on full liquids. Having some flatus and had a small bowel movement this morning.  Objective: Vital signs in last 24 hours: Temp:  [98.9 F (37.2 C)-100.7 F (38.2 C)] 99.1 F (37.3 C) (01/03 0545) Pulse Rate:  [90-91] 90 (01/03 0545) Resp:  [16-20] 16 (01/03 0545) BP: (110-125)/(71-80) 116/71 mmHg (01/03 0545) SpO2:  [99 %-100 %] 100 % (01/03 0545) Last BM Date: 12/18/14  Intake/Output from previous day: 01/02 0701 - 01/03 0700 In: 5056.3 [P.O.:1320; I.V.:3736.3] Out: 452 [Urine:451; Emesis/NG output:1] Intake/Output this shift:    General appearance: alert, cooperative and no distress GI: mild tenderness around the surgical site Incision/Wound: dressing clean and dry, just changed wound looked very clean yesterday.  Lab Results:   Recent Labs  12/20/14 0450 12/21/14 0506  WBC 8.5 10.4  HGB 10.2* 11.6*  HCT 31.8* 36.9  PLT 243 281   BMET  Recent Labs  12/20/14 0450 12/21/14 0506  NA 140 135  K 3.3* 3.1*  CL 104 98  CO2 29 32  GLUCOSE 90 83  BUN <5* <5*  CREATININE 0.52 0.58  CALCIUM 8.3* 8.3*     Studies/Results: No results found.  Anti-infectives: Anti-infectives    Start     Dose/Rate Route Frequency Ordered Stop   12/18/14 2000  cefoTEtan (CEFOTAN) 2 g in dextrose 5 % 50 mL IVPB     2 g100 mL/hr over 30 Minutes Intravenous Every 12 hours 12/18/14 1013 12/18/14 2148   12/18/14 0512  cefoTEtan (CEFOTAN) 2 g in dextrose 5 % 50 mL IVPB     2 g100 mL/hr over 30 Minutes Intravenous On call to O.R. 12/18/14 3343 12/18/14 0737      Assessment/Plan: s/p Procedure(s): LOOP ILEOSTOMY REVERSAL Doing well, no apparent complications. Probable mild ileus. Continue full liquid diet today. Ambulation encouraged.   LOS: 3 days    Mikeal Winstanley T 12/21/2014

## 2014-12-22 ENCOUNTER — Encounter (HOSPITAL_COMMUNITY): Payer: Self-pay | Admitting: General Surgery

## 2014-12-22 NOTE — Progress Notes (Signed)
Patient ID: Jessica [name], female   DOB: 12/30/1995, 19 y.o.   MRN: [id number] 4 Days Post-Op  Subjective: Pain is gradually better. She has some nausea but no vomiting. Sipping on full liquids. Having some flatus and had a small bowel movement this morning.  Objective: Vital signs in last 24 hours: Temp:  [98.5 F (36.9 C)-99.6 F (37.6 C)] 98.5 F (36.9 C) (01/04 0529) Pulse Rate:  [89-102] 89 (01/04 0529) Resp:  [16-20] 20 (01/04 0529) BP: (120-126)/(72-86) 124/72 mmHg (01/04 0529) SpO2:  [99 %-100 %] 100 % (01/04 0529) Last BM Date: 12/21/14  Intake/Output from previous day: 01/03 0701 - 01/04 0700 In: 2880 [P.O.:1080; I.V.:1800] Out: 404 [Urine:402; Stool:2] Intake/Output this shift:    General appearance: alert, cooperative and no distress GI: mild tenderness around the surgical site Incision/Wound: dressing clean and dry  Lab Results:   Recent Labs  12/20/14 0450 12/21/14 0506  WBC 8.5 10.4  HGB 10.2* 11.6*  HCT 31.8* 36.9  PLT 243 281   BMET  Recent Labs  12/20/14 0450 12/21/14 0506  NA 140 135  K 3.3* 3.1*  CL 104 98  CO2 29 32  GLUCOSE 90 83  BUN <5* <5*  CREATININE 0.52 0.58  CALCIUM 8.3* 8.3*     Studies/Results: No results found.  Anti-infectives: Anti-infectives    Start     Dose/Rate Route Frequency Ordered Stop   12/18/14 2000  cefoTEtan (CEFOTAN) 2 g in dextrose 5 % 50 mL IVPB     2 g100 mL/hr over 30 Minutes Intravenous Every 12 hours 12/18/14 1013 12/18/14 2148   12/18/14 0512  cefoTEtan (CEFOTAN) 2 g in dextrose 5 % 50 mL IVPB     2 g100 mL/hr over 30 Minutes Intravenous On call to O.R. 12/18/14 8257 12/18/14 0737      Assessment/Plan: s/p Procedure(s): LOOP ILEOSTOMY REVERSAL Doing well, no apparent complications. Probable mild ileus. Continue full liquid diet today until nausea resolves. Ambulation encouraged.   LOS: 4 days    Amand Lemoine C. 03/28/3551

## 2014-12-23 LAB — BASIC METABOLIC PANEL
Anion gap: 5 (ref 5–15)
BUN: <5 mg/dL — ABNORMAL LOW (ref 6–23)
CO2: 30 mmol/L (ref 19–32)
Calcium: 8.3 mg/dL — ABNORMAL LOW (ref 8.4–10.5)
Chloride: 101 mEq/L (ref 96–112)
Creatinine, Ser: 0.54 mg/dL (ref 0.50–1.10)
GFR calc Af Amer: >90 mL/min (ref >90)
GFR calc non Af Amer: >90 mL/min (ref >90)
Glucose, Bld: 91 mg/dL (ref 70–99)
Potassium: 3.5 mmol/L (ref 3.5–5.1)
Sodium: 136 mmol/L (ref 135–145)

## 2014-12-23 LAB — CBC
HCT: 31.8 % — ABNORMAL LOW (ref 36.0–46.0)
Hemoglobin: 10 g/dL — ABNORMAL LOW (ref 12.0–15.0)
MCH: 28 pg (ref 26.0–34.0)
MCHC: 31.4 g/dL (ref 30.0–36.0)
MCV: 89.1 fL (ref 78.0–100.0)
Platelets: 249 10*3/uL (ref 150–400)
RBC: 3.57 MIL/uL — ABNORMAL LOW (ref 3.87–5.11)
RDW: 13.4 % (ref 11.5–15.5)
WBC: 7.2 10*3/uL (ref 4.0–10.5)

## 2014-12-23 NOTE — Discharge Instructions (Signed)
ABDOMINAL SURGERY: POST OP INSTRUCTIONS  1. DIET: Follow a light bland diet the first 24 hours after arrival home, such as soup, liquids, crackers, etc.  Be sure to include lots of fluids daily.  Avoid fast food or heavy meals as your are more likely to get nauseated.  Eat a low fat the next few days after surgery.   2. Take your usually prescribed home medications unless otherwise directed. 3. PAIN CONTROL: a. Pain is best controlled by a usual combination of three different methods TOGETHER: i. Ice/Heat ii. Over the counter pain medication iii. Prescription pain medication b. Most patients will experience some swelling and bruising around the incisions.  Ice packs or heating pads (30-60 minutes up to 6 times a day) will help. Use ice for the first few days to help decrease swelling and bruising, then switch to heat to help relax tight/sore spots and speed recovery.  Some people prefer to use ice alone, heat alone, alternating between ice & heat.  Experiment to what works for you.  Swelling and bruising can take several weeks to resolve.   c. It is helpful to take an over-the-counter pain medication regularly for the first few weeks.  Choose one of the following that works best for you: i. Naproxen (Aleve, etc)  Two 217m tabs twice a day ii. Ibuprofen (Advil, etc) Three 2029mtabs four times a day (every meal & bedtime) iii. Acetaminophen (Tylenol, etc) 500-65062mour times a day (every meal & bedtime) d. A  prescription for pain medication (such as oxycodone, hydrocodone, etc) should be given to you upon discharge.  Take your pain medication as prescribed.  i. If you are having problems/concerns with the prescription medicine (does not control pain, nausea, vomiting, rash, itching, etc), please call us [phone] see if we need to switch you to a different pain medicine that will work better for you and/or control your side effect better. ii. If you need a refill on your pain medication,  please contact your pharmacy.  They will contact our office to request authorization. Prescriptions will not be filled after 5 pm or on week-ends. 4. Avoid getting constipated.  Between the surgery and the pain medications, it is common to experience some constipation.  Increasing fluid intake and taking a fiber supplement (such as Metamucil, Citrucel, FiberCon, MiraLax, etc) 1-2 times a day regularly will usually help prevent this problem from occurring.  A mild laxative (prune juice, Milk of Magnesia, MiraLax, etc) should be taken according to package directions if there are no bowel movements after 48 hours.   5. Watch out for diarrhea.  If you have many loose bowel movements, simplify your diet to bland foods & liquids for a few days.  Stop any stool softeners and decrease your fiber supplement.  Switching to mild anti-diarrheal medications (Kayopectate, Pepto Bismol) can help.  If this worsens or does not improve, please call us.[place]. Wash / shower every day.  You may shower over the incision / wound.  Avoid baths until the skin is fully healed.  Continue to shower over incision(s) after the dressing is off. 7. Keep wound covered until it heals.  Change dressing once a day and as needed.  Clean wound with soapy water daily. 8. ACTIVITIES as tolerated:   a. You may resume regular (light) daily activities beginning the next day--such as daily self-care, walking, climbing stairs--gradually increasing activities as tolerated.  If you can walk 30 minutes without difficulty, it is safe to try  more intense activity such as jogging, treadmill, bicycling, low-impact aerobics, swimming, etc. b. Save the most intensive and strenuous activity for last such as sit-ups, heavy lifting, contact sports, etc  Refrain from any heavy lifting or straining until you are off narcotics for pain control.   c. DO NOT PUSH THROUGH PAIN.  Let pain be your guide: If it hurts to do something, don't do it.  Pain is your body warning  you to avoid that activity for another week until the pain goes down. d. You may drive when you are no longer taking prescription pain medication, you can comfortably wear a seatbelt, and you can safely maneuver your car and apply brakes. e. Dennis Bast may have sexual intercourse when it is comfortable.  9. FOLLOW UP in our office a. Please call [facility] at [phone] to set up an appointment to see your surgeon in the office for a follow-up appointment approximately 1-2 weeks after your surgery. b. Make sure that you call for this appointment the day you arrive home to insure a convenient appointment time. 10. IF YOU HAVE DISABILITY OR FAMILY LEAVE FORMS, BRING THEM TO THE OFFICE FOR PROCESSING.  DO NOT GIVE THEM TO YOUR DOCTOR.   WHEN TO CALL us [phone]: 1. Poor pain control 2. Reactions / problems with new medications (rash/itching, nausea, etc)  3. Fever over 101.5 F (38.5 C) 4. Inability to urinate 5. Nausea and/or vomiting 6. Worsening swelling or bruising 7. Continued bleeding from incision. 8. Increased pain, redness, or drainage from the incision  The clinic staff is available to answer your questions during regular business hours (8:30am-5pm).  Please dont hesitate to call and ask to speak to one of our nurses for clinical concerns.   A surgeon from [facility] is always on call at the hospitals   If you have a medical emergency, go to the nearest emergency room or call 911.    [facility], [place], [place], [place], [place]  [address] ? MAIN: [phone] ? TOLL FREE: [phone] ? FAX [phone] [email or web address]

## 2014-12-23 NOTE — Progress Notes (Signed)
Patient ID: NYOMI HOWSER, female   DOB: 05-12-1996, 19 y.o.   MRN: [id number] 5 Days Post-Op  Subjective: Pain and nausea better. Tolerating full liquids. Having flatus and bowel movements.  Objective: Vital signs in last 24 hours: Temp:  [98.1 F (36.7 C)-98.7 F (37.1 C)] 98.7 F (37.1 C) (01/05 0603) Pulse Rate:  [88-107] 88 (01/05 0603) Resp:  [18-20] 18 (01/05 0603) BP: (117-131)/(76-90) 117/76 mmHg (01/05 0603) SpO2:  [100 %] 100 % (01/05 0603) Last BM Date: 12/21/14  Intake/Output from previous day: 01/04 0701 - 01/05 0700 In: 5717.9 [P.O.:1080; I.V.:4637.9] Out: 1100 [Urine:1100] Intake/Output this shift:    General appearance: alert, cooperative and no distress GI: mild tenderness around the surgical site Incision/Wound: clean, wick removed  Lab Results:   Recent Labs  12/21/14 0506 12/23/14 0434  WBC 10.4 7.2  HGB 11.6* 10.0*  HCT 36.9 31.8*  PLT 281 249   BMET  Recent Labs  12/21/14 0506 12/23/14 0434  NA 135 136  K 3.1* 3.5  CL 98 101  CO2 32 30  GLUCOSE 83 91  BUN <5* <5*  CREATININE 0.58 0.54  CALCIUM 8.3* 8.3*     Studies/Results: No results found.  Anti-infectives: Anti-infectives    Start     Dose/Rate Route Frequency Ordered Stop   12/18/14 2000  cefoTEtan (CEFOTAN) 2 g in dextrose 5 % 50 mL IVPB     2 g100 mL/hr over 30 Minutes Intravenous Every 12 hours 12/18/14 1013 12/18/14 2148   12/18/14 0512  cefoTEtan (CEFOTAN) 2 g in dextrose 5 % 50 mL IVPB     2 g100 mL/hr over 30 Minutes Intravenous On call to O.R. 12/18/14 8616 12/18/14 0737      Assessment/Plan: s/p Procedure(s): LOOP ILEOSTOMY REVERSAL Doing well, no apparent complications. Ileus appears to be resolved. Advance to soft. Ambulation encouraged.  Anticipate d/c tom   LOS: 5 days    Chelbi Herber C. 07/21/7289

## 2014-12-24 MED ORDER — OXYCODONE-ACETAMINOPHEN 5-325 MG PO TABS: 1.0000 | ORAL_TABLET | Freq: Four times a day (QID) | ORAL | Status: DC | PRN

## 2014-12-24 NOTE — Progress Notes (Signed)
Discharge instructions reviewed with patient and pt verbalized understanding. Dressing changed and instructed patient of dressing changes and care per discharge instructions. Pt mother will be driving her home. 2 PRN percocets given for pain. Pt has no further concerns or questions at this time.

## 2014-12-24 NOTE — Discharge Summary (Signed)
Physician Discharge Summary  Patient ID: Jessica [name] MRN: [id number] DOB/AGE: Apr 09, 1996 19 y.o.  Admit date: 12/18/2014 Discharge date: 12/24/2014  Admission Diagnoses: Diversion colitis, Crohn's disease  Discharge Diagnoses:  Active Problems:   Diversion colitis   Discharged Condition: good  Hospital Course: The patient was admitted after closure of her loop ileostomy.  Her foley was removed by POD 2.  Her diet was advanced slowly.  By POD 6 she was tolerating a soft diet, ambulating without difficulty and her pain was controlled with PO medications.    Consults: None  Significant Diagnostic Studies: labs: cbc, chemistry  Treatments: IV hydration, analgesia: acetaminophen w/ codeine and surgery: see above  Discharge Exam: Blood pressure 112/72, pulse 83, temperature 97.9 F (36.6 C), temperature source Oral, resp. rate 16, height 5' 6"  (1.676 m), weight 160 lb (72.576 kg), last menstrual period 12/10/2014, SpO2 100 %, not currently breastfeeding. General appearance: alert and cooperative GI: normal findings: soft, non-tender Incision/Wound: clean  Disposition: 01-Home or Self Care     Medication List    STOP taking these medications        acetaminophen 500 MG tablet  Commonly known as:  TYLENOL     hydrocortisone 100 MG/60ML enema  Commonly known as:  CORTENEMA      TAKE these medications        dicyclomine 10 MG capsule  Commonly known as:  BENTYL  Take 1 capsule (10 mg total) by mouth 2 (two) times daily.     folic acid 1 MG tablet  Commonly known as:  FOLVITE  Take 1 tablet (1 mg total) by mouth daily.     norethindrone-ethinyl estradiol 1-20 MG-MCG tablet  Commonly known as:  MICROGESTIN,JUNEL,LOESTRIN  Take 1 tablet by mouth daily. Start in 3 weeks     ondansetron 4 MG tablet  Commonly known as:  ZOFRAN  Take 1 tablet (4 mg total) by mouth every 8 (eight) hours as needed for nausea or vomiting.     oxyCODONE-acetaminophen 5-325 MG per tablet   Commonly known as:  PERCOCET  Take 1-2 tablets by mouth every 6 (six) hours as needed.     sulfaSALAzine 500 MG tablet  Commonly known as:  AZULFIDINE  Take 1 tablet (500 mg total) by mouth 2 (two) times daily.           Follow-up Information    Follow up with Rosario Adie., MD. Schedule an appointment as soon as possible for a visit in 2 weeks.   Specialty:  General Surgery   Contact information:   [address] [place] [address] [phone]       Signed: Rosario Adie 04/18/257, 5:27 PM

## 2015-01-02 ENCOUNTER — Inpatient Hospital Stay (HOSPITAL_COMMUNITY)
Admission: EM | Admit: 2015-01-02 | Discharge: 2015-01-06 | DRG: 386 | Disposition: A | Discharge status: Home / Self Care | Payer: Medicaid Other | Attending: General Surgery | Admitting: General Surgery

## 2015-01-02 ENCOUNTER — Encounter (HOSPITAL_COMMUNITY): Payer: Self-pay | Admitting: *Deleted

## 2015-01-02 DIAGNOSIS — Z8249 Family history of ischemic heart disease and other diseases of the circulatory system: Secondary | ICD-10-CM

## 2015-01-02 DIAGNOSIS — M810 Age-related osteoporosis without current pathological fracture: Secondary | ICD-10-CM | POA: Diagnosis present

## 2015-01-02 DIAGNOSIS — E440 Moderate protein-calorie malnutrition: Secondary | ICD-10-CM | POA: Insufficient documentation

## 2015-01-02 DIAGNOSIS — Z801 Family history of malignant neoplasm of trachea, bronchus and lung: Secondary | ICD-10-CM

## 2015-01-02 DIAGNOSIS — K5900 Constipation, unspecified: Secondary | ICD-10-CM | POA: Diagnosis not present

## 2015-01-02 DIAGNOSIS — Z833 Family history of diabetes mellitus: Secondary | ICD-10-CM

## 2015-01-02 DIAGNOSIS — R110 Nausea: Secondary | ICD-10-CM | POA: Diagnosis present

## 2015-01-02 DIAGNOSIS — Z79899 Other long term (current) drug therapy: Secondary | ICD-10-CM

## 2015-01-02 DIAGNOSIS — R109 Unspecified abdominal pain: Secondary | ICD-10-CM | POA: Diagnosis present

## 2015-01-02 DIAGNOSIS — K529 Noninfective gastroenteritis and colitis, unspecified: Secondary | ICD-10-CM

## 2015-01-02 DIAGNOSIS — Z79891 Long term (current) use of opiate analgesic: Secondary | ICD-10-CM

## 2015-01-02 DIAGNOSIS — K5010 Crohn's disease of large intestine without complications: Principal | ICD-10-CM | POA: Diagnosis present

## 2015-01-02 NOTE — ED Notes (Signed)
Pt reports post-op "reverse colitis" x 2 weeks ago.  Started to have n/v/d x 2 days ago and noticed greenish yellow drainage from the area where her stoma was.  Denies any foul odor at this time.  Pt also reports pain in that area.  Pt states she had called Dr. Marcello Moores yesterday re this and has yet to return her call.

## 2015-01-03 ENCOUNTER — Emergency Department (HOSPITAL_COMMUNITY): Payer: Medicaid Other

## 2015-01-03 DIAGNOSIS — M810 Age-related osteoporosis without current pathological fracture: Secondary | ICD-10-CM | POA: Diagnosis present

## 2015-01-03 DIAGNOSIS — Z801 Family history of malignant neoplasm of trachea, bronchus and lung: Secondary | ICD-10-CM | POA: Diagnosis not present

## 2015-01-03 DIAGNOSIS — R109 Unspecified abdominal pain: Secondary | ICD-10-CM | POA: Diagnosis present

## 2015-01-03 DIAGNOSIS — E440 Moderate protein-calorie malnutrition: Secondary | ICD-10-CM | POA: Diagnosis present

## 2015-01-03 DIAGNOSIS — Z79891 Long term (current) use of opiate analgesic: Secondary | ICD-10-CM | POA: Diagnosis not present

## 2015-01-03 DIAGNOSIS — R110 Nausea: Secondary | ICD-10-CM | POA: Diagnosis present

## 2015-01-03 DIAGNOSIS — K5010 Crohn's disease of large intestine without complications: Secondary | ICD-10-CM | POA: Diagnosis present

## 2015-01-03 DIAGNOSIS — K5900 Constipation, unspecified: Secondary | ICD-10-CM | POA: Diagnosis not present

## 2015-01-03 DIAGNOSIS — Z833 Family history of diabetes mellitus: Secondary | ICD-10-CM | POA: Diagnosis not present

## 2015-01-03 DIAGNOSIS — K529 Noninfective gastroenteritis and colitis, unspecified: Secondary | ICD-10-CM | POA: Diagnosis present

## 2015-01-03 DIAGNOSIS — Z79899 Other long term (current) drug therapy: Secondary | ICD-10-CM | POA: Diagnosis not present

## 2015-01-03 DIAGNOSIS — Z8249 Family history of ischemic heart disease and other diseases of the circulatory system: Secondary | ICD-10-CM | POA: Diagnosis not present

## 2015-01-03 LAB — URINE MICROSCOPIC-ADD ON

## 2015-01-03 LAB — CBC WITH DIFFERENTIAL/PLATELET
Basophils Absolute: 0 10*3/uL (ref 0.0–0.1)
Basophils Relative: 0 % (ref 0–1)
Eosinophils Absolute: 0 10*3/uL (ref 0.0–0.7)
Eosinophils Relative: 0 % (ref 0–5)
HCT: 37.4 % (ref 36.0–46.0)
Hemoglobin: 12 g/dL (ref 12.0–15.0)
Lymphocytes Relative: 23 % (ref 12–46)
Lymphs Abs: 2.1 10*3/uL (ref 0.7–4.0)
MCH: 28.4 pg (ref 26.0–34.0)
MCHC: 32.1 g/dL (ref 30.0–36.0)
MCV: 88.6 fL (ref 78.0–100.0)
Monocytes Absolute: 0.6 10*3/uL (ref 0.1–1.0)
Monocytes Relative: 7 % (ref 3–12)
Neutro Abs: 6.3 10*3/uL (ref 1.7–7.7)
Neutrophils Relative %: 70 % (ref 43–77)
Platelets: 388 10*3/uL (ref 150–400)
RBC: 4.22 MIL/uL (ref 3.87–5.11)
RDW: 13.8 % (ref 11.5–15.5)
WBC: 9 10*3/uL (ref 4.0–10.5)

## 2015-01-03 LAB — COMPREHENSIVE METABOLIC PANEL
ALT: 15 U/L (ref 0–35)
AST: 17 U/L (ref 0–37)
Albumin: 3.9 g/dL (ref 3.5–5.2)
Alkaline Phosphatase: 76 U/L (ref 39–117)
Anion gap: 6 (ref 5–15)
BUN: 7 mg/dL (ref 6–23)
CO2: 27 mmol/L (ref 19–32)
Calcium: 9.1 mg/dL (ref 8.4–10.5)
Chloride: 104 mEq/L (ref 96–112)
Creatinine, Ser: 0.58 mg/dL (ref 0.50–1.10)
GFR calc Af Amer: >90 mL/min (ref >90)
GFR calc non Af Amer: >90 mL/min (ref >90)
Glucose, Bld: 89 mg/dL (ref 70–99)
Potassium: 3.8 mmol/L (ref 3.5–5.1)
Sodium: 137 mmol/L (ref 135–145)
Total Bilirubin: 0.3 mg/dL (ref 0.3–1.2)
Total Protein: 8 g/dL (ref 6.0–8.3)

## 2015-01-03 LAB — URINALYSIS, ROUTINE W REFLEX MICROSCOPIC
Bilirubin Urine: NEGATIVE
Glucose, UA: NEGATIVE mg/dL
Hgb urine dipstick: NEGATIVE
Ketones, ur: NEGATIVE mg/dL
Nitrite: NEGATIVE
Protein, ur: NEGATIVE mg/dL
Specific Gravity, Urine: 1.019 (ref 1.005–1.030)
Urobilinogen, UA: 1 mg/dL (ref 0.0–1.0)
pH: 6.5 (ref 5.0–8.0)

## 2015-01-03 LAB — CBC
HCT: 33 % — ABNORMAL LOW (ref 36.0–46.0)
Hemoglobin: 10.3 g/dL — ABNORMAL LOW (ref 12.0–15.0)
MCH: 28 pg (ref 26.0–34.0)
MCHC: 31.2 g/dL (ref 30.0–36.0)
MCV: 89.7 fL (ref 78.0–100.0)
Platelets: 332 10*3/uL (ref 150–400)
RBC: 3.68 MIL/uL — ABNORMAL LOW (ref 3.87–5.11)
RDW: 13.8 % (ref 11.5–15.5)
WBC: 6.7 10*3/uL (ref 4.0–10.5)

## 2015-01-03 LAB — CREATININE, SERUM
Creatinine, Ser: 0.51 mg/dL (ref 0.50–1.10)
GFR calc Af Amer: >90 mL/min (ref >90)
GFR calc non Af Amer: >90 mL/min (ref >90)

## 2015-01-03 LAB — PREGNANCY, URINE: Preg Test, Ur: NEGATIVE

## 2015-01-03 MED ORDER — SULFASALAZINE 500 MG PO TABS
500.0000 mg | ORAL_TABLET | Freq: Two times a day (BID) | ORAL | Status: DC
  Administered 2015-01-03 – 2015-01-06 (×7): 500 mg via ORAL
  Filled 2015-01-03 (×8): qty 1

## 2015-01-03 MED ORDER — IOHEXOL 300 MG/ML  SOLN
100.0000 mL | Freq: Once | INTRAMUSCULAR | Status: AC | PRN
  Administered 2015-01-03: 100 mL via INTRAVENOUS

## 2015-01-03 MED ORDER — PROMETHAZINE HCL 25 MG/ML IJ SOLN
12.5000 mg | Freq: Once | INTRAMUSCULAR | Status: AC
  Administered 2015-01-03: 12.5 mg via INTRAVENOUS
  Filled 2015-01-03: qty 1

## 2015-01-03 MED ORDER — MORPHINE SULFATE 4 MG/ML IJ SOLN
4.0000 mg | Freq: Once | INTRAMUSCULAR | Status: AC
Start: 2015-01-03 — End: 2015-01-03
  Administered 2015-01-03: 4 mg via INTRAVENOUS
  Filled 2015-01-03: qty 1

## 2015-01-03 MED ORDER — NORETHINDRONE ACET-ETHINYL EST 1-20 MG-MCG PO TABS: 1.0000 | ORAL_TABLET | Freq: Every day | ORAL | Status: DC

## 2015-01-03 MED ORDER — IOHEXOL 300 MG/ML  SOLN
50.0000 mL | Freq: Once | INTRAMUSCULAR | Status: AC | PRN
  Administered 2015-01-03: 50 mL via ORAL

## 2015-01-03 MED ORDER — MORPHINE SULFATE 2 MG/ML IJ SOLN
1.0000 mg | INTRAMUSCULAR | Status: DC | PRN
  Administered 2015-01-03 – 2015-01-06 (×21): 1 mg via INTRAVENOUS
  Filled 2015-01-03 (×20): qty 1

## 2015-01-03 MED ORDER — SODIUM CHLORIDE 0.9 % IV BOLUS (SEPSIS)
1000.0000 mL | Freq: Once | INTRAVENOUS | Status: AC
  Administered 2015-01-03: 1000 mL via INTRAVENOUS

## 2015-01-03 MED ORDER — PROMETHAZINE HCL 25 MG/ML IJ SOLN
25.0000 mg | Freq: Once | INTRAMUSCULAR | Status: DC
  Filled 2015-01-03: qty 1

## 2015-01-03 MED ORDER — ONDANSETRON HCL 4 MG PO TABS
4.0000 mg | ORAL_TABLET | Freq: Three times a day (TID) | ORAL | Status: DC | PRN
  Administered 2015-01-03 – 2015-01-06 (×6): 4 mg via ORAL
  Filled 2015-01-03 (×6): qty 1

## 2015-01-03 MED ORDER — HEPARIN SODIUM (PORCINE) 5000 UNIT/ML IJ SOLN
5000.0000 [IU] | Freq: Three times a day (TID) | INTRAMUSCULAR | Status: DC
  Administered 2015-01-03 – 2015-01-06 (×9): 5000 [IU] via SUBCUTANEOUS
  Filled 2015-01-03 (×12): qty 1

## 2015-01-03 MED ORDER — FOLIC ACID 1 MG PO TABS
1.0000 mg | ORAL_TABLET | Freq: Every day | ORAL | Status: DC
  Administered 2015-01-03 – 2015-01-06 (×4): 1 mg via ORAL
  Filled 2015-01-03 (×4): qty 1

## 2015-01-03 MED ORDER — DICYCLOMINE HCL 10 MG PO CAPS
10.0000 mg | ORAL_CAPSULE | Freq: Two times a day (BID) | ORAL | Status: DC
  Administered 2015-01-03 – 2015-01-05 (×5): 10 mg via ORAL
  Filled 2015-01-03 (×6): qty 1

## 2015-01-03 MED ORDER — KCL IN DEXTROSE-NACL 20-5-0.45 MEQ/L-%-% IV SOLN
INTRAVENOUS | Status: DC
  Administered 2015-01-03 – 2015-01-05 (×6): via INTRAVENOUS
  Filled 2015-01-03 (×6): qty 1000

## 2015-01-03 MED ORDER — OXYCODONE-ACETAMINOPHEN 5-325 MG PO TABS
1.0000 | ORAL_TABLET | Freq: Four times a day (QID) | ORAL | Status: DC | PRN
  Administered 2015-01-03 – 2015-01-06 (×8): 2 via ORAL
  Filled 2015-01-03 (×8): qty 2

## 2015-01-03 NOTE — H&P (Signed)
Chief Complaint:  Abdominal pain, nausea, diarrhea and drainage from ileostomy takedown site  History of Present Illness:  Jessica [name] is an 19 y.o. female who underwent takedown of a loop ileostomy on New Year's Eve presents with a couple of days of abdominal pain and nausea.  She called the [facility] office yesterday but did not receive a nursing response.  She continued to hurt and presented to the [facility] ER for evaluation that included CT scan that is remarkable for possible inflammatory changes in the colon but no abscesses, dilated bowel or evidence of a leak.  She is admitted at this time for hydration and further evaluation of her abdominal pain.   Past Medical History  Diagnosis Date  . Crohn's disease   . Anemia   . Ileostomy in place   . Osteoporosis     Past Surgical History  Procedure Laterality Date  . Small intestine surgery    . Incision and drainage Left     arm  . Dilation and evacuation N/A 10/31/2014    Procedure: DILATATION AND EVACUATION;  Surgeon: Lahoma Crocker, MD;  Location: [facility];  Service: Gynecology;  Laterality: N/A;  . Flexible sigmoidoscopy N/A 11/04/2014    Procedure: FLEXIBLE SIGMOIDOSCOPY;  Surgeon: [name], MD;  Location: [facility];  Service: Endoscopy;  Laterality: N/A;  . Ileostomy closure N/A 12/18/2014    Procedure: LOOP ILEOSTOMY REVERSAL;  Surgeon: [name], MD;  Location: [facility];  Service: General;  Laterality: N/A;    No current facility-administered medications for this encounter.   Current Outpatient Prescriptions  Medication Sig Dispense Refill  . folic acid (FOLVITE) 1 MG tablet Take 1 tablet (1 mg total) by mouth daily. 100 tablet 0  . norethindrone-ethinyl estradiol (MICROGESTIN,JUNEL,LOESTRIN) 1-20 MG-MCG tablet Take 1 tablet by mouth daily. Start in 3 weeks (Patient taking differently: Take 1 tablet by mouth daily. ) 1 Package 11  . sulfaSALAzine (AZULFIDINE) 500 MG tablet Take 1 tablet (500 mg total) by mouth 2 (two)  times daily. 60 tablet 1  . dicyclomine (BENTYL) 10 MG capsule Take 1 capsule (10 mg total) by mouth 2 (two) times daily. 30 capsule 0  . ondansetron (ZOFRAN) 4 MG tablet Take 1 tablet (4 mg total) by mouth every 8 (eight) hours as needed for nausea or vomiting. 30 tablet 0  . oxyCODONE-acetaminophen (PERCOCET) 5-325 MG per tablet Take 1-2 tablets by mouth every 6 (six) hours as needed. 30 tablet 0   Review of patient's allergies indicates no known allergies. Family History  Problem Relation Age of Onset  . Diabetes Father   . Stroke Neg Hx   . Diabetes Maternal Grandmother   . Heart disease Maternal Grandmother   . Hypertension Maternal Grandmother   . Kidney disease Maternal Grandmother   . Cancer Maternal Grandfather     lung  . Hyperlipidemia Maternal Grandfather    Social History:   reports that she has never smoked. She has never used smokeless tobacco. She reports that she does not drink alcohol or use illicit drugs.   REVIEW OF SYSTEMS : Negative except for Crohn's since she was 9 diagnosed at [facility]  Physical Exam:   Blood pressure 120/80, pulse 78, temperature 98.9 F (37.2 C), temperature source Oral, resp. rate 20, height 5' 6"  (1.676 m), weight 160 lb (72.576 kg), last menstrual period 11/12/2014, SpO2 100 %, not currently breastfeeding. Body mass index is 25.84 kg/(m^2).  Gen:  WDWN AAF NAD  Neurological: Alert and oriented to person,  place, and time. Motor and sensory function is grossly intact  Head: Normocephalic and atraumatic.  Eyes: Conjunctivae are normal. Pupils are equal, round, and reactive to light. No scleral icterus.  Neck: Normal range of motion. Neck supple. No tracheal deviation or thyromegaly present.  Cardiovascular:  SR without murmurs or gallops.  No carotid bruits Breast:  Not examined Respiratory: Effort normal.  No respiratory distress. No chest wall tenderness. Breath sounds normal.  No wheezes, rales or rhonchi.  Abdomen:  Flat; sore in upper  abdomen but no rebound or guarding.  BS +;  Ileostomy site without redness and some yellow discharge noted atop an otherwise beefy red granulating wound GU:  Not examined Musculoskeletal: Normal range of motion. Extremities are nontender. No cyanosis, edema or clubbing noted Lymphadenopathy: No cervical, preauricular, postauricular or axillary adenopathy is present Skin: Skin is warm and dry. No rash noted. No diaphoresis. No erythema. No pallor. Pscyh: Normal mood and affect. Behavior is normal. Judgment and thought content normal.   LABORATORY RESULTS: Results for orders placed or performed during the hospital encounter of 01/02/15 (from the past 48 hour(s))  Urinalysis, Routine w reflex microscopic     Status: Abnormal   Collection Time: 01/02/15 11:20 PM  Result Value Ref Range   Color, Urine YELLOW YELLOW   APPearance CLOUDY (A) CLEAR   Specific Gravity, Urine 1.019 1.005 - 1.030   pH 6.5 5.0 - 8.0   Glucose, UA NEGATIVE NEGATIVE mg/dL   Hgb urine dipstick NEGATIVE NEGATIVE   Bilirubin Urine NEGATIVE NEGATIVE   Ketones, ur NEGATIVE NEGATIVE mg/dL   Protein, ur NEGATIVE NEGATIVE mg/dL   Urobilinogen, UA 1.0 0.0 - 1.0 mg/dL   Nitrite NEGATIVE NEGATIVE   Leukocytes, UA SMALL (A) NEGATIVE  Pregnancy, urine     Status: None   Collection Time: 01/02/15 11:20 PM  Result Value Ref Range   Preg Test, Ur NEGATIVE NEGATIVE    Comment:        THE SENSITIVITY OF THIS METHODOLOGY IS >20 mIU/mL.   Urine microscopic-add on     Status: Abnormal   Collection Time: 01/02/15 11:20 PM  Result Value Ref Range   Squamous Epithelial / LPF MANY (A) RARE   WBC, UA 3-6 <3 WBC/hpf   RBC / HPF 0-2 <3 RBC/hpf   Bacteria, UA RARE RARE  CBC with Differential     Status: None   Collection Time: 01/02/15 11:34 PM  Result Value Ref Range   WBC 9.0 4.0 - 10.5 K/uL   RBC 4.22 3.87 - 5.11 MIL/uL   Hemoglobin 12.0 12.0 - 15.0 g/dL   HCT 37.4 36.0 - 46.0 %   MCV 88.6 78.0 - 100.0 fL   MCH 28.4 26.0 - 34.0  pg   MCHC 32.1 30.0 - 36.0 g/dL   RDW 13.8 11.5 - 15.5 %   Platelets 388 150 - 400 K/uL   Neutrophils Relative % 70 43 - 77 %   Neutro Abs 6.3 1.7 - 7.7 K/uL   Lymphocytes Relative 23 12 - 46 %   Lymphs Abs 2.1 0.7 - 4.0 K/uL   Monocytes Relative 7 3 - 12 %   Monocytes Absolute 0.6 0.1 - 1.0 K/uL   Eosinophils Relative 0 0 - 5 %   Eosinophils Absolute 0.0 0.0 - 0.7 K/uL   Basophils Relative 0 0 - 1 %   Basophils Absolute 0.0 0.0 - 0.1 K/uL  Comprehensive metabolic panel     Status: None   Collection Time: 01/02/15  11:34 PM  Result Value Ref Range   Sodium 137 135 - 145 mmol/L    Comment: Please note change in reference range.   Potassium 3.8 3.5 - 5.1 mmol/L    Comment: Please note change in reference range.   Chloride 104 96 - 112 mEq/L   CO2 27 19 - 32 mmol/L   Glucose, Bld 89 70 - 99 mg/dL   BUN 7 6 - 23 mg/dL   Creatinine, Ser 0.58 0.50 - 1.10 mg/dL   Calcium 9.1 8.4 - 10.5 mg/dL   Total Protein 8.0 6.0 - 8.3 g/dL   Albumin 3.9 3.5 - 5.2 g/dL   AST 17 0 - 37 U/L   ALT 15 0 - 35 U/L   Alkaline Phosphatase 76 39 - 117 U/L   Total Bilirubin 0.3 0.3 - 1.2 mg/dL   GFR calc non Af Amer >90 >90 mL/min   GFR calc Af Amer >90 >90 mL/min    Comment: (NOTE) The eGFR has been calculated using the CKD EPI equation. This calculation has not been validated in all clinical situations. eGFR's persistently <90 mL/min signify possible Chronic Kidney Disease.    Anion gap 6 5 - 15     RADIOLOGY RESULTS: Ct Abdomen Pelvis W Contrast  01/03/2015   CLINICAL DATA:  Acute onset of nausea, vomiting and diarrhea, with drainage from the site of the patient's ileostomy reversal. Initial encounter.  EXAM: CT ABDOMEN AND PELVIS WITH CONTRAST  TECHNIQUE: Multidetector CT imaging of the abdomen and pelvis was performed using the standard protocol following bolus administration of intravenous contrast.  CONTRAST:  68m OMNIPAQUE IOHEXOL 300 MG/ML SOLN, 1069mOMNIPAQUE IOHEXOL 300 MG/ML SOLN   COMPARISON:  CT of the abdomen and pelvis from 11/01/2014  FINDINGS: The visualized lung bases are clear.  The liver and spleen are unremarkable in appearance. The gallbladder is decompressed and within normal limits. The pancreas and adrenal glands are unremarkable.  A 1.3 cm cyst is noted at the upper pole of the left kidney. The kidneys are otherwise unremarkable. There is no evidence of hydronephrosis. No renal or ureteral stones are seen. No perinephric stranding is appreciated.  Soft tissue inflammation at the right lower quadrant anterior abdominal wall reflects the site of reversal of ileostomy. There is mild thickening of the underlying musculature, possibly reflecting trace fluid. No defined abscess is seen.  There is mild residual wall thickening along the ascending and transverse colon, distal to the patient's ileocolic anastomosis, concerning for mild infectious or inflammatory colitis. Mild residual wall thickening is also noted at the distal sigmoid colon.  No free fluid is identified. A small bowel suture line at the right hemipelvis demonstrates surrounding soft tissue inflammation and wall thickening, raising concern for mild ileitis. Enlarged pericecal nodes are seen.  The stomach is within normal limits. No acute vascular abnormalities are seen.  The bladder is mildly distended and grossly unremarkable. The uterus demonstrates mildly decreased attenuation at the fundus, of uncertain significance. No mass is seen. No suspicious adnexal masses are seen. A small 2.4 cm right adnexal cyst is likely physiologic in nature. No inguinal lymphadenopathy is seen.  No acute osseous abnormalities are identified.  IMPRESSION: 1. Small bowel suture line at the right hemipelvis demonstrates surrounding soft tissue inflammation and ileal wall thickening, raising concern for mild infectious or inflammatory ileitis. 2. Mild residual wall thickening along the ascending and transverse colon, improved from November  2015, extending distal to the patient's ileocolic anastomosis, concerning for mild residual  infectious or inflammatory colitis. Mild residual wall thickening also noted at the distal sigmoid colon. 3. Soft tissue inflammation at the right lower quadrant anterior abdominal wall reflects the site of reversal of ileostomy. There is mild thickening of the underlying musculature, possibly reflecting trace fluid. No defined abscess seen. 4. Enlarged pericecal nodes noted. 5. Small left renal cyst seen.   Electronically Signed   By: Garald Balding M.D.   On: 01/03/2015 06:07    Problem List: Patient Active Problem List   Diagnosis Date Noted  . 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 Absolute anemia   . CC (Crohn's colitis) 11/02/2014  . Rectal bleeding 11/02/2014  . Crohn disease 11/01/2014  . Missed abortion 10/31/2014  . Gestational hypertension w/o significant proteinuria in 3rd trimester 07/14/2013  . Supervision of normal first pregnancy 05/03/2013  . Crohn's disease 05/03/2013  . Ileostomy in place 05/03/2013    Assessment & Plan: Two weeks post takedown of ileostomy for disuse colitis in history of Crohn's.   Admit for hydration and observation.  May need repeat colon exam.      Matt B. Hassell Done, MD, [facility] [phone] beeper [phone]  01/03/2015 7:40 AM

## 2015-01-03 NOTE — ED Notes (Signed)
Called to give report, receiving rn was in a room. Will return phone call.

## 2015-01-03 NOTE — ED Provider Notes (Signed)
CSN: [id number]     Arrival date & time 01/02/15  2155 History   First MD Initiated Contact with Patient 01/03/15 0232     Chief Complaint  Patient presents with  . Vomiting  . Diarrhea     (Consider location/radiation/quality/duration/timing/severity/associated sxs/prior Treatment) HPI Comments: Pt presents to ED to today c/o NVD x2 days. She reports 3 episodes of diarrhea and 4 episodes of vomiting in the last 24hrs. She is 2wks S/P ileostomy reversal which had been in place x52yr for Crohn's Disease with malnourishment. The reversal was performed due to diversion colitis. She reports the procedure was without complications. She has been nauseous since the procedure and has Zofran PO PRN, but it has been ineffective x2 days. She reports non-bilious vomit. She also reports constipation with small pellet like stools from post-op until 2 days ago when she began having watery diarrhea 3x daily. She denies blood in stool or emesis. She reports flatulence without issue. She also c/o LLQ abd pn, cramping in nature that she is unable to further describe. She denies any recent fever, abx use, or sick contacts.  Patient is a 19y.o. female presenting with diarrhea. The history is provided by the patient. No language interpreter was used.  Diarrhea Quality:  Watery Severity:  Moderate Number of episodes:  X34 in the last 24h Duration:  2 days Associated symptoms: abdominal pain and vomiting   Associated symptoms: no arthralgias, no chills, no fever and no myalgias   Risk factors: no recent antibiotic use     Past Medical History  Diagnosis Date  . Crohn's disease   . Anemia   . Ileostomy in place   . Osteoporosis    Past Surgical History  Procedure Laterality Date  . Small intestine surgery    . Incision and drainage Left     arm  . Dilation and evacuation N/A 10/31/2014    Procedure: DILATATION AND EVACUATION;  Surgeon: LLahoma Crocker MD;  [facility];  Service: Gynecology;   Laterality: N/A;  . Flexible sigmoidoscopy N/A 11/04/2014    Procedure: FLEXIBLE SIGMOIDOSCOPY;  Surgeon: DLafayette Dragon MD;  [facility];  Service: Endoscopy;  Laterality: N/A;  . Ileostomy closure N/A 12/18/2014    Procedure: LOOP ILEOSTOMY REVERSAL;  Surgeon: ALeighton Ruff MD;  [facility];  Service: General;  Laterality: N/A;   Family History  Problem Relation Age of Onset  . Diabetes Father   . Stroke Neg Hx   . Diabetes Maternal Grandmother   . Heart disease Maternal Grandmother   . Hypertension Maternal Grandmother   . Kidney disease Maternal Grandmother   . Cancer Maternal Grandfather     lung  . Hyperlipidemia Maternal Grandfather    History  Substance Use Topics  . Smoking status: Never Smoker   . Smokeless tobacco: Never Used  . Alcohol Use: No   OB History    Gravida Para Term Preterm AB TAB SAB Ectopic Multiple Living   2 1 1       1      Review of Systems  Constitutional: Negative for fever and chills.  HENT: Negative for rhinorrhea, sore throat and trouble swallowing.   Respiratory: Negative for cough and shortness of breath.   Cardiovascular: Negative for chest pain, palpitations and leg swelling.  Gastrointestinal: Positive for nausea, vomiting, abdominal pain and diarrhea. Negative for blood in stool and abdominal distention.  Genitourinary: Negative for dysuria, frequency and hematuria.  Musculoskeletal: Negative for myalgias, arthralgias, neck pain and  neck stiffness.  Skin: Negative for color change and rash.  Neurological: Negative for dizziness, seizures, syncope, light-headedness and numbness.      Allergies  Review of patient's allergies indicates no known allergies.  Home Medications   Prior to Admission medications   Medication Sig Start Date End Date Taking? Authorizing Provider  folic acid (FOLVITE) 1 MG tablet Take 1 tablet (1 mg total) by mouth daily. 12/05/14  Yes [name], MD  norethindrone-ethinyl estradiol  (MICROGESTIN,JUNEL,LOESTRIN) 1-20 MG-MCG tablet Take 1 tablet by mouth daily. Start in 3 weeks Patient taking differently: Take 1 tablet by mouth daily.  10/31/14  Yes Lahoma Crocker, MD  sulfaSALAzine (AZULFIDINE) 500 MG tablet Take 1 tablet (500 mg total) by mouth 2 (two) times daily. 12/05/14  Yes [name], MD  dicyclomine (BENTYL) 10 MG capsule Take 1 capsule (10 mg total) by mouth 2 (two) times daily. 11/07/14   Modena Jansky, MD  ondansetron (ZOFRAN) 4 MG tablet Take 1 tablet (4 mg total) by mouth every 8 (eight) hours as needed for nausea or vomiting. 11/21/14   Courtney A Forcucci, PA-C  oxyCODONE-acetaminophen (PERCOCET) 5-325 MG per tablet Take 1-2 tablets by mouth every 6 (six) hours as needed. 1/0/27   [name], MD   BP 127/86 mmHg  Pulse 110  Temp(Src) 98.9 F (37.2 C) (Oral)  Resp 20  Ht 5' 6"  (1.676 m)  Wt 160 lb (72.576 kg)  BMI 25.84 kg/m2  SpO2 100%  LMP 11/12/2014 Physical Exam  Constitutional: She is oriented to person, place, and time. She appears well-developed and well-nourished. No distress.  HENT:  Mouth/Throat: Oropharynx is clear and moist.  NCAT  Eyes: Conjunctivae are normal. Pupils are equal, round, and reactive to light. No scleral icterus.  Neck: Normal range of motion. Neck supple. No tracheal deviation present.  Cardiovascular: Tachycardia present.   No murmur heard. Pulmonary/Chest: Effort normal.  CTA b/l  Abdominal: Soft. She exhibits no distension.  Bowel sounds present x4, normal to percussion, mild tenderness to palp over LLQ, no masses noted, open wound healing with primary intention over RLQ  Musculoskeletal: Normal range of motion. She exhibits no edema.  Lymphadenopathy:    She has no cervical adenopathy.  Neurological: She is alert and oriented to person, place, and time.  CN III-XII grossly intact  Skin: Skin is warm and dry. No rash noted. No erythema.  Psychiatric: She has a normal mood and affect.    ED Course   Procedures (including critical care time) Labs Review Labs Reviewed  URINALYSIS, ROUTINE W REFLEX MICROSCOPIC - Abnormal; Notable for the following:    APPearance CLOUDY (*)    Leukocytes, UA SMALL (*)    All other components within normal limits  URINE MICROSCOPIC-ADD ON - Abnormal; Notable for the following:    Squamous Epithelial / LPF MANY (*)    All other components within normal limits  CBC WITH DIFFERENTIAL  COMPREHENSIVE METABOLIC PANEL  PREGNANCY, URINE    Imaging Review No results found.   EKG Interpretation None      MDM   Final diagnoses:  None    1. Abdominal pain 2. Nausea, vomiting, diarrhea 3. 2 weeks post-operative abdominal procedure  CT scan pending. She is having difficulty managing contrast without vomiting. Additional medications ordered.   Plan: obtain CT scan. Contact surgeon given recent procedure to reverse ostomy. Admit with any abnormality on CT scan, and consider admission if vomiting persists for intractable vomiting.  CT showing diffuse infectious  vs. Inflammatory bowel wall changes. Discussed with Dr. Kaylyn Lim who advises surgical service will admit.   Dewaine Oats, PA-C 01/03/15 [place], PA-C 01/03/15 [id number]  Julianne Rice, MD 01/03/15 [phone]

## 2015-01-03 NOTE — Progress Notes (Signed)
Utilization Review completed.  

## 2015-01-04 LAB — CBC
HCT: 35.1 % — ABNORMAL LOW (ref 36.0–46.0)
Hemoglobin: 10.8 g/dL — ABNORMAL LOW (ref 12.0–15.0)
MCH: 27.8 pg (ref 26.0–34.0)
MCHC: 30.8 g/dL (ref 30.0–36.0)
MCV: 90.5 fL (ref 78.0–100.0)
Platelets: 339 10*3/uL (ref 150–400)
RBC: 3.88 MIL/uL (ref 3.87–5.11)
RDW: 13.9 % (ref 11.5–15.5)
WBC: 5.4 10*3/uL (ref 4.0–10.5)

## 2015-01-04 LAB — BASIC METABOLIC PANEL
Anion gap: 8 (ref 5–15)
BUN: <5 mg/dL — ABNORMAL LOW (ref 6–23)
CO2: 29 mmol/L (ref 19–32)
Calcium: 8.9 mg/dL (ref 8.4–10.5)
Chloride: 102 mEq/L (ref 96–112)
Creatinine, Ser: 0.53 mg/dL (ref 0.50–1.10)
GFR calc Af Amer: >90 mL/min (ref >90)
GFR calc non Af Amer: >90 mL/min (ref >90)
Glucose, Bld: 103 mg/dL — ABNORMAL HIGH (ref 70–99)
Potassium: 3.8 mmol/L (ref 3.5–5.1)
Sodium: 139 mmol/L (ref 135–145)

## 2015-01-04 MED ORDER — BOOST / RESOURCE BREEZE PO LIQD
1.0000 | Freq: Two times a day (BID) | ORAL | Status: DC
Start: 2015-01-04 — End: 2015-01-06
  Administered 2015-01-04 – 2015-01-06 (×2): 1 via ORAL

## 2015-01-04 NOTE — Progress Notes (Signed)
Patient ID: Jessica [name], female   DOB: Nov 21, 1996, 21 y.o.   MRN: [id number] [facility] Surgery Progress Note:   * No surgery found *  Subjective: Mental status is clear.  Hungry and tolerating liquids Objective: Vital signs in last 24 hours: Temp:  [97.5 F (36.4 C)-98.1 F (36.7 C)] 97.8 F (36.6 C) (01/17 0542) Pulse Rate:  [64-73] 70 (01/17 0542) Resp:  [16-18] 18 (01/17 0542) BP: (101-150)/(62-82) 115/82 mmHg (01/17 0542) SpO2:  [100 %] 100 % (01/17 0542) Weight:  [155 lb 10.3 oz (70.6 kg)] 155 lb 10.3 oz (70.6 kg) (01/16 0920)  Intake/Output from previous day: 01/16 0701 - 01/17 0700 In: 1991.7 [I.V.:1991.7] Out: -  Intake/Output this shift:    Physical Exam: Work of breathing is normal.  Abdomen is not tender  Lab Results:  Results for orders placed or performed during the hospital encounter of 01/02/15 (from the past 48 hour(s))  Urinalysis, Routine w reflex microscopic     Status: Abnormal   Collection Time: 01/02/15 11:20 PM  Result Value Ref Range   Color, Urine YELLOW YELLOW   APPearance CLOUDY (A) CLEAR   Specific Gravity, Urine 1.019 1.005 - 1.030   pH 6.5 5.0 - 8.0   Glucose, UA NEGATIVE NEGATIVE mg/dL   Hgb urine dipstick NEGATIVE NEGATIVE   Bilirubin Urine NEGATIVE NEGATIVE   Ketones, ur NEGATIVE NEGATIVE mg/dL   Protein, ur NEGATIVE NEGATIVE mg/dL   Urobilinogen, UA 1.0 0.0 - 1.0 mg/dL   Nitrite NEGATIVE NEGATIVE   Leukocytes, UA SMALL (A) NEGATIVE  Pregnancy, urine     Status: None   Collection Time: 01/02/15 11:20 PM  Result Value Ref Range   Preg Test, Ur NEGATIVE NEGATIVE    Comment:        THE SENSITIVITY OF THIS METHODOLOGY IS >20 mIU/mL.   Urine microscopic-add on     Status: Abnormal   Collection Time: 01/02/15 11:20 PM  Result Value Ref Range   Squamous Epithelial / LPF MANY (A) RARE   WBC, UA 3-6 <3 WBC/hpf   RBC / HPF 0-2 <3 RBC/hpf   Bacteria, UA RARE RARE  CBC with Differential     Status: None   Collection Time:  01/02/15 11:34 PM  Result Value Ref Range   WBC 9.0 4.0 - 10.5 K/uL   RBC 4.22 3.87 - 5.11 MIL/uL   Hemoglobin 12.0 12.0 - 15.0 g/dL   HCT 37.4 36.0 - 46.0 %   MCV 88.6 78.0 - 100.0 fL   MCH 28.4 26.0 - 34.0 pg   MCHC 32.1 30.0 - 36.0 g/dL   RDW 13.8 11.5 - 15.5 %   Platelets 388 150 - 400 K/uL   Neutrophils Relative % 70 43 - 77 %   Neutro Abs 6.3 1.7 - 7.7 K/uL   Lymphocytes Relative 23 12 - 46 %   Lymphs Abs 2.1 0.7 - 4.0 K/uL   Monocytes Relative 7 3 - 12 %   Monocytes Absolute 0.6 0.1 - 1.0 K/uL   Eosinophils Relative 0 0 - 5 %   Eosinophils Absolute 0.0 0.0 - 0.7 K/uL   Basophils Relative 0 0 - 1 %   Basophils Absolute 0.0 0.0 - 0.1 K/uL  Comprehensive metabolic panel     Status: None   Collection Time: 01/02/15 11:34 PM  Result Value Ref Range   Sodium 137 135 - 145 mmol/L    Comment: Please note change in reference range.   Potassium 3.8 3.5 - 5.1 mmol/L  Comment: Please note change in reference range.   Chloride 104 96 - 112 mEq/L   CO2 27 19 - 32 mmol/L   Glucose, Bld 89 70 - 99 mg/dL   BUN 7 6 - 23 mg/dL   Creatinine, Ser 0.58 0.50 - 1.10 mg/dL   Calcium 9.1 8.4 - 10.5 mg/dL   Total Protein 8.0 6.0 - 8.3 g/dL   Albumin 3.9 3.5 - 5.2 g/dL   AST 17 0 - 37 U/L   ALT 15 0 - 35 U/L   Alkaline Phosphatase 76 39 - 117 U/L   Total Bilirubin 0.3 0.3 - 1.2 mg/dL   GFR calc non Af Amer >90 >90 mL/min   GFR calc Af Amer >90 >90 mL/min    Comment: (NOTE) The eGFR has been calculated using the CKD EPI equation. This calculation has not been validated in all clinical situations. eGFR's persistently <90 mL/min signify possible Chronic Kidney Disease.    Anion gap 6 5 - 15  CBC     Status: Abnormal   Collection Time: 01/03/15 10:27 AM  Result Value Ref Range   WBC 6.7 4.0 - 10.5 K/uL   RBC 3.68 (L) 3.87 - 5.11 MIL/uL   Hemoglobin 10.3 (L) 12.0 - 15.0 g/dL   HCT 33.0 (L) 36.0 - 46.0 %   MCV 89.7 78.0 - 100.0 fL   MCH 28.0 26.0 - 34.0 pg   MCHC 31.2 30.0 - 36.0  g/dL   RDW 13.8 11.5 - 15.5 %   Platelets 332 150 - 400 K/uL  Creatinine, serum     Status: None   Collection Time: 01/03/15 10:27 AM  Result Value Ref Range   Creatinine, Ser 0.51 0.50 - 1.10 mg/dL   GFR calc non Af Amer >90 >90 mL/min   GFR calc Af Amer >90 >90 mL/min    Comment: (NOTE) The eGFR has been calculated using the CKD EPI equation. This calculation has not been validated in all clinical situations. eGFR's persistently <90 mL/min signify possible Chronic Kidney Disease.   CBC     Status: Abnormal   Collection Time: 01/04/15  5:53 AM  Result Value Ref Range   WBC 5.4 4.0 - 10.5 K/uL   RBC 3.88 3.87 - 5.11 MIL/uL   Hemoglobin 10.8 (L) 12.0 - 15.0 g/dL   HCT 35.1 (L) 36.0 - 46.0 %   MCV 90.5 78.0 - 100.0 fL   MCH 27.8 26.0 - 34.0 pg   MCHC 30.8 30.0 - 36.0 g/dL   RDW 13.9 11.5 - 15.5 %   Platelets 339 150 - 400 K/uL  Basic metabolic panel     Status: Abnormal   Collection Time: 01/04/15  5:53 AM  Result Value Ref Range   Sodium 139 135 - 145 mmol/L    Comment: Please note change in reference range.   Potassium 3.8 3.5 - 5.1 mmol/L    Comment: Please note change in reference range.   Chloride 102 96 - 112 mEq/L   CO2 29 19 - 32 mmol/L   Glucose, Bld 103 (H) 70 - 99 mg/dL   BUN <5 (L) 6 - 23 mg/dL    Comment: CONSISTENT WITH PREVIOUS RESULT   Creatinine, Ser 0.53 0.50 - 1.10 mg/dL   Calcium 8.9 8.4 - 10.5 mg/dL   GFR calc non Af Amer >90 >90 mL/min   GFR calc Af Amer >90 >90 mL/min    Comment: (NOTE) The eGFR has been calculated using the CKD EPI equation. This calculation  has not been validated in all clinical situations. eGFR's persistently <90 mL/min signify possible Chronic Kidney Disease.    Anion gap 8 5 - 15    Radiology/Results: Ct Abdomen Pelvis W Contrast  01/03/2015   CLINICAL DATA:  Acute onset of nausea, vomiting and diarrhea, with drainage from the site of the patient's ileostomy reversal. Initial encounter.  EXAM: CT ABDOMEN AND PELVIS WITH  CONTRAST  TECHNIQUE: Multidetector CT imaging of the abdomen and pelvis was performed using the standard protocol following bolus administration of intravenous contrast.  CONTRAST:  59m OMNIPAQUE IOHEXOL 300 MG/ML SOLN, 1027mOMNIPAQUE IOHEXOL 300 MG/ML SOLN  COMPARISON:  CT of the abdomen and pelvis from 11/01/2014  FINDINGS: The visualized lung bases are clear.  The liver and spleen are unremarkable in appearance. The gallbladder is decompressed and within normal limits. The pancreas and adrenal glands are unremarkable.  A 1.3 cm cyst is noted at the upper pole of the left kidney. The kidneys are otherwise unremarkable. There is no evidence of hydronephrosis. No renal or ureteral stones are seen. No perinephric stranding is appreciated.  Soft tissue inflammation at the right lower quadrant anterior abdominal wall reflects the site of reversal of ileostomy. There is mild thickening of the underlying musculature, possibly reflecting trace fluid. No defined abscess is seen.  There is mild residual wall thickening along the ascending and transverse colon, distal to the patient's ileocolic anastomosis, concerning for mild infectious or inflammatory colitis. Mild residual wall thickening is also noted at the distal sigmoid colon.  No free fluid is identified. A small bowel suture line at the right hemipelvis demonstrates surrounding soft tissue inflammation and wall thickening, raising concern for mild ileitis. Enlarged pericecal nodes are seen.  The stomach is within normal limits. No acute vascular abnormalities are seen.  The bladder is mildly distended and grossly unremarkable. The uterus demonstrates mildly decreased attenuation at the fundus, of uncertain significance. No mass is seen. No suspicious adnexal masses are seen. A small 2.4 cm right adnexal cyst is likely physiologic in nature. No inguinal lymphadenopathy is seen.  No acute osseous abnormalities are identified.  IMPRESSION: 1. Small bowel suture line  at the right hemipelvis demonstrates surrounding soft tissue inflammation and ileal wall thickening, raising concern for mild infectious or inflammatory ileitis. 2. Mild residual wall thickening along the ascending and transverse colon, improved from November 2015, extending distal to the patient's ileocolic anastomosis, concerning for mild residual infectious or inflammatory colitis. Mild residual wall thickening also noted at the distal sigmoid colon. 3. Soft tissue inflammation at the right lower quadrant anterior abdominal wall reflects the site of reversal of ileostomy. There is mild thickening of the underlying musculature, possibly reflecting trace fluid. No defined abscess seen. 4. Enlarged pericecal nodes noted. 5. Small left renal cyst seen.   Electronically Signed   By: JeGarald Balding.D.   On: 01/03/2015 06:07    Anti-infectives: Anti-infectives    None      Assessment/Plan: Problem List: Patient Active Problem List   Diagnosis Date Noted  . Nausea 01/03/2015  . 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 Absolute anemia   . CC (Crohn's colitis) 11/02/2014  . Rectal bleeding 11/02/2014  . Crohn disease 11/01/2014  . Missed abortion 10/31/2014  . Gestational hypertension w/o significant proteinuria in 3rd trimester 07/14/2013  . Supervision of normal first pregnancy 05/03/2013  . Crohn's disease 05/03/2013  . Ileostomy in place 05/03/2013    Will advance diet to regular and see how she tolerates.  If recurrent pain she may require colonoscopy.   * No surgery found *    LOS: 2 days   Matt B. Hassell Done, MD, [facility] [phone] beeper [phone]  01/04/2015 8:18 AM

## 2015-01-04 NOTE — Progress Notes (Signed)
INITIAL NUTRITION ASSESSMENT  DOCUMENTATION CODES Per approved criteria  -Non-severe (moderate) malnutrition in the context of acute illness or injury  Pt meets criteria for moderate MALNUTRITION in the context of acute illness as evidenced by 8% weight loss x 2 months and energy intake <75% for > 7days.  INTERVENTION: -Provide Resource Breeze po BID, each supplement provides 250 kcal and 9 grams of protein -Encouraged PO intake -RD to continue to monitor  NUTRITION DIAGNOSIS: Unintentional weight loss related to nausea as evidenced by 8% weight loss x 2 months.   Goal: Pt to meet >/= 90% of their estimated nutrition needs   Monitor:  PO and supplemental intake, weight, labs, I/O's  Reason for Assessment: Pt identified as at nutrition risk on the Malnutrition Screen Tool  Admitting Dx: Abdominal pain, nausea, diarrhea and drainage from ileostomy takedown site  ASSESSMENT: 19 y.o. female who underwent takedown of a loop ileostomy on New Year's Eve presents with a couple of days of abdominal pain and nausea.  Pt reports poor appetite and PO intake since surgery (ileostomy takedown) on 12/18/2014. Pt states she has not been able to keep anything down and has been nauseous since surgery.  Per weight history documentation, pt has lost 14 lb since 11/07/14 (8% weight loss x 2 months).  Pt was ordering lunch during visit. Pt is willing to receive Resource Breeze supplements while here. RD to order BID. Pt states she is willing to try anything to keep food down.  Labs reviewed: Low BUN  Height: Ht Readings from Last 1 Encounters:  01/03/15 5' 2"  (1.575 m)    Weight: Wt Readings from Last 1 Encounters:  01/03/15 155 lb 10.3 oz (70.6 kg)    Ideal Body Weight: 110 lb  % Ideal Body Weight: 141%  Wt Readings from Last 10 Encounters:  01/03/15 155 lb 10.3 oz (70.6 kg)  12/18/14 160 lb (72.576 kg)  12/15/14 160 lb 4 oz (72.689 kg)  11/18/14 159 lb (72.122 kg)  11/07/14 169  lb 12.8 oz (77.021 kg)  10/29/14 159 lb 12.8 oz (72.485 kg)  10/21/14 161 lb (73.029 kg) (88 %*, Z = 1.15)  11/03/13 169 lb 6.4 oz (76.839 kg) (92 %*, Z = 1.41)  07/15/13 191 lb (86.637 kg) (97 %*, Z = 1.83)  07/11/13 196 lb (88.905 kg) (97 %*, Z = 1.91)   * Growth percentiles are based on CDC 2-20 Years data.    Usual Body Weight: 168 lb per pt  % Usual Body Weight: 92%  BMI:  Body mass index is 28.46 kg/(m^2).  Estimated Nutritional Needs: Kcal: 1750-1950 Protein: 85-95g Fluid: 1.8L/day  Skin: surgical incision from 12/31  Diet Order: Diet regular  EDUCATION NEEDS: -No education needs identified at this time   Intake/Output Summary (Last 24 hours) at 01/04/15 1158 Last data filed at 01/04/15 0537  Gross per 24 hour  Intake 1991.67 ml  Output      0 ml  Net 1991.67 ml    Last BM: PTA  Labs:   Recent Labs Lab 01/02/15 2334 01/03/15 1027 01/04/15 0553  NA 137  --  139  K 3.8  --  3.8  CL 104  --  102  CO2 27  --  29  BUN 7  --  <5*  CREATININE 0.58 0.51 0.53  CALCIUM 9.1  --  8.9  GLUCOSE 89  --  103*    CBG (last 3)  No results for input(s): GLUCAP in the last 72 hours.  Scheduled Meds: . dicyclomine  10 mg Oral BID  . folic acid  1 mg Oral Daily  . heparin  5,000 Units Subcutaneous 3 times per day  . norethindrone-ethinyl estradiol  1 tablet Oral Daily  . sulfaSALAzine  500 mg Oral BID    Continuous Infusions: . dextrose 5 % and 0.45 % NaCl with KCl 20 mEq/L 100 mL/hr at 01/04/15 2493    Past Medical History  Diagnosis Date  . Crohn's disease   . Anemia   . Ileostomy in place   . Osteoporosis     Past Surgical History  Procedure Laterality Date  . Small intestine surgery    . Incision and drainage Left     arm  . Dilation and evacuation N/A 10/31/2014    Procedure: DILATATION AND EVACUATION;  Surgeon: Lahoma Crocker, MD;  Location: [facility];  Service: Gynecology;  Laterality: N/A;  . Flexible sigmoidoscopy N/A 11/04/2014     Procedure: FLEXIBLE SIGMOIDOSCOPY;  Surgeon: [name], MD;  [facility];  Service: Endoscopy;  Laterality: N/A;  . Ileostomy closure N/A 12/18/2014    Procedure: LOOP ILEOSTOMY REVERSAL;  Surgeon: [name], MD;  Location: [facility];  Service: General;  Laterality: N/A;    Clayton Bibles, MS, RD, LDN Pager: [phone] After Hours Pager: [phone]

## 2015-01-05 DIAGNOSIS — E440 Moderate protein-calorie malnutrition: Secondary | ICD-10-CM | POA: Insufficient documentation

## 2015-01-05 MED ORDER — PROMETHAZINE HCL 25 MG/ML IJ SOLN: 12.5000 mg | INTRAMUSCULAR | Status: DC | PRN

## 2015-01-05 MED ORDER — PROMETHAZINE HCL 25 MG/ML IJ SOLN
12.5000 mg | Freq: Once | INTRAMUSCULAR | Status: AC
  Administered 2015-01-05: 12.5 mg via INTRAVENOUS
  Filled 2015-01-05: qty 1

## 2015-01-05 MED ORDER — ENSURE COMPLETE PO LIQD
237.0000 mL | Freq: Two times a day (BID) | ORAL | Status: DC
  Administered 2015-01-05 – 2015-01-06 (×3): 237 mL via ORAL

## 2015-01-05 MED ORDER — DICYCLOMINE HCL 10 MG PO CAPS
10.0000 mg | ORAL_CAPSULE | Freq: Three times a day (TID) | ORAL | Status: DC
  Administered 2015-01-05 – 2015-01-06 (×4): 10 mg via ORAL
  Filled 2015-01-05 (×8): qty 1

## 2015-01-05 NOTE — Progress Notes (Signed)
Patient ID: Jessica [name], female   DOB: [birth year], 19 y.o.   MRN: [id number] [facility] Surgery Progress Note:   * No surgery found *  Subjective: Mental status is clear.  States she is eating some of her reg diet but has abd pain after she eats. Objective: Vital signs in last 24 hours: Temp:  [97.9 F (36.6 C)-98.7 F (37.1 C)] 97.9 F (36.6 C) (01/18 0625) Pulse Rate:  [83-91] 83 (01/18 0625) Resp:  [18-20] 20 (01/18 0625) BP: (101-109)/(52-80) 101/52 mmHg (01/18 0625) SpO2:  [99 %-100 %] 100 % (01/18 0625)  Intake/Output from previous day: 01/17 0701 - 01/18 0700 In: 840 [P.O.:840] Out: -  Intake/Output this shift:    Physical Exam: Work of breathing is normal.  Abdomen is not tender to palpation Dressing clean dry and intact  Lab Results:  Results for orders placed or performed during the hospital encounter of 01/02/15 (from the past 48 hour(s))  CBC     Status: Abnormal   Collection Time: 01/04/15  5:53 AM  Result Value Ref Range   WBC 5.4 4.0 - 10.5 K/uL   RBC 3.88 3.87 - 5.11 MIL/uL   Hemoglobin 10.8 (L) 12.0 - 15.0 g/dL   HCT 35.1 (L) 36.0 - 46.0 %   MCV 90.5 78.0 - 100.0 fL   MCH 27.8 26.0 - 34.0 pg   MCHC 30.8 30.0 - 36.0 g/dL   RDW 13.9 11.5 - 15.5 %   Platelets 339 150 - 400 K/uL  Basic metabolic panel     Status: Abnormal   Collection Time: 01/04/15  5:53 AM  Result Value Ref Range   Sodium 139 135 - 145 mmol/L    Comment: Please note change in reference range.   Potassium 3.8 3.5 - 5.1 mmol/L    Comment: Please note change in reference range.   Chloride 102 96 - 112 mEq/L   CO2 29 19 - 32 mmol/L   Glucose, Bld 103 (H) 70 - 99 mg/dL   BUN <5 (L) 6 - 23 mg/dL    Comment: CONSISTENT WITH PREVIOUS RESULT   Creatinine, Ser 0.53 0.50 - 1.10 mg/dL   Calcium 8.9 8.4 - 10.5 mg/dL   GFR calc non Af Amer >90 >90 mL/min   GFR calc Af Amer >90 >90 mL/min    Comment: (NOTE) The eGFR has been calculated using the CKD EPI equation. This calculation has  not been validated in all clinical situations. eGFR's persistently <90 mL/min signify possible Chronic Kidney Disease.    Anion gap 8 5 - 15    Radiology/Results: No results found.  Anti-infectives: Anti-infectives    None      Assessment/Plan: Problem List: Patient Active Problem List   Diagnosis Date Noted  . Malnutrition of moderate degree 01/05/2015  . Nausea 01/03/2015  . 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 Absolute anemia   . CC (Crohn's colitis) 11/02/2014  . Rectal bleeding 11/02/2014  . Crohn disease 11/01/2014  . Missed abortion 10/31/2014  . Gestational hypertension w/o significant proteinuria in 3rd trimester 07/14/2013  . Supervision of normal first pregnancy 05/03/2013  . Crohn's disease 05/03/2013  . Ileostomy in place 05/03/2013    Will advance diet to regular and use ensure supplements and Bentyl and see how she tolerates.  Possible d/c tom   LOS: 3 days   Rosario Adie, MD  [facility]   01/05/2015 12:48 PM

## 2015-01-06 ENCOUNTER — Telehealth: Payer: Self-pay | Admitting: Nurse Practitioner

## 2015-01-06 MED ORDER — DICYCLOMINE HCL 10 MG PO CAPS: 10.0000 mg | ORAL_CAPSULE | Freq: Three times a day (TID) | ORAL | Status: DC

## 2015-01-06 MED ORDER — PROMETHAZINE HCL 12.5 MG PO TABS: 12.5000 mg | ORAL_TABLET | Freq: Four times a day (QID) | ORAL | Status: DC | PRN

## 2015-01-06 MED ORDER — ENSURE COMPLETE PO LIQD: 237.0000 mL | Freq: Two times a day (BID) | ORAL | Status: DC

## 2015-01-06 MED ORDER — OXYCODONE-ACETAMINOPHEN 5-325 MG PO TABS: 1.0000 | ORAL_TABLET | Freq: Four times a day (QID) | ORAL | Status: DC | PRN

## 2015-01-06 NOTE — Discharge Summary (Signed)
Physician Discharge Summary  Patient ID: Jessica [name] MRN: [id number] DOB/AGE: 1996/06/26 19 y.o.  Admit date: 01/02/2015 Discharge date: 01/06/2015  Admission Diagnoses: nausea, abd pain  Discharge Diagnoses:  Active Problems:   Nausea   Malnutrition of moderate degree   Discharged Condition: fair  Hospital Course: Patient admitted for nausea and abd pain.  CT scan was normal except for some thickening of the colon, which is chronic.  Pt was hydrated and given medications to control for pain and nausea.    Consults: None  Significant Diagnostic Studies: labs: cbc, chemistry  Treatments: IV hydration  Discharge Exam: Blood pressure 115/70, pulse 100, temperature 98.6 F (37 C), temperature source Oral, resp. rate 18, height 5' 2"  (1.575 m), weight 155 lb 10.3 oz (70.6 kg), last menstrual period 11/12/2014, SpO2 100 %, not currently breastfeeding. General appearance: alert and cooperative GI: normal findings: soft, non-tender Incision/Wound: clean, dry  Disposition: 01-Home or Self Care     Medication List    TAKE these medications        dicyclomine 10 MG capsule  Commonly known as:  BENTYL  Take 1 capsule (10 mg total) by mouth 4 (four) times daily -  before meals and at bedtime.     feeding supplement (ENSURE COMPLETE) Liqd  Take 237 mLs by mouth 2 (two) times daily between meals.     folic acid 1 MG tablet  Commonly known as:  FOLVITE  Take 1 tablet (1 mg total) by mouth daily.     norethindrone-ethinyl estradiol 1-20 MG-MCG tablet  Commonly known as:  MICROGESTIN,JUNEL,LOESTRIN  Take 1 tablet by mouth daily. Start in 3 weeks     ondansetron 4 MG tablet  Commonly known as:  ZOFRAN  Take 1 tablet (4 mg total) by mouth every 8 (eight) hours as needed for nausea or vomiting.     oxyCODONE-acetaminophen 5-325 MG per tablet  Commonly known as:  PERCOCET  Take 1-2 tablets by mouth every 6 (six) hours as needed.     promethazine 12.5 MG tablet  Commonly  known as:  PHENERGAN  Take 1-2 tablets (12.5-25 mg total) by mouth every 6 (six) hours as needed for nausea.     sulfaSALAzine 500 MG tablet  Commonly known as:  AZULFIDINE  Take 1 tablet (500 mg total) by mouth 2 (two) times daily.           Follow-up Information    Follow up with Rosario Adie., MD. Schedule an appointment as soon as possible for a visit in 2 weeks.   Specialty:  General Surgery   Contact information:   [address] [place] [address] [phone]       Follow up with Delfin Edis, MD. Schedule an appointment as soon as possible for a visit in 1 week.   Specialty:  Gastroenterology   Contact information:   [address] [place] [address] [phone]       Signed: Rosario Adie 8/92/1194, 1:74 PM

## 2015-01-06 NOTE — Discharge Instructions (Signed)
LAPAROSCOPIC SURGERY: POST OP INSTRUCTIONS  1. DIET: Follow a light bland diet the first 24 hours after arrival home, such as soup, liquids, crackers, etc.  Be sure to include lots of fluids daily.  Avoid fast food or heavy meals as your are more likely to get nauseated.  Eat a low fat the next few days after surgery.   2. Take your usually prescribed home medications unless otherwise directed. 3. PAIN CONTROL: a. Pain is best controlled by a usual combination of three different methods TOGETHER: i. Ice/Heat ii. Over the counter pain medication iii. Prescription pain medication b.  Ice packs or heating pads (30-60 minutes up to 6 times a day) may help.  Some people prefer to use ice alone, heat alone, alternating between ice & heat.  Experiment to what works for you.   c. It is helpful to take an over-the-counter pain medication regularly for the first few weeks.  Choose one of the following that works best for you: i. Naproxen (Aleve, etc)  Two 260m tabs twice a day ii. Ibuprofen (Advil, etc) Three 2068mtabs four times a day (every meal & bedtime) d. A  prescription for pain medication (such as percocet, vicodin, oxycodone, hydrocodone, etc) should be given to you upon discharge.  Take your pain medication as prescribed.  i. If you are having problems/concerns with the prescription medicine (does not control pain, nausea, vomiting, rash, itching, etc), please call [phone] see if we need to switch you to a different pain medicine that will work better for you and/or control your side effect better. ii. If you need a refill on your pain medication, please contact your pharmacy.  They will contact our office to request authorization. Prescriptions will not be filled after 5 pm or on week-ends.  4. Avoid getting constipated.  Between the surgery and the pain medications, it is common to experience some constipation.  Increasing fluid intake and taking a fiber supplement (such as  Metamucil, Citrucel, FiberCon, MiraLax, etc) 1-2 times a day regularly will usually help prevent this problem from occurring.  A mild laxative (prune juice, Milk of Magnesia, MiraLax, etc) should be taken according to package directions if there are no bowel movements after 48 hours.   5. Watch out for diarrhea.  If you have many loose bowel movements, simplify your diet to bland foods & liquids for a few days.  Stop any stool softeners and decrease your fiber supplement.  Switching to mild anti-diarrheal medications (Kayopectate, Pepto Bismol) can help.  If this worsens or does not improve, please call [email or web address]. Change your dressing daily 7. ACTIVITIES as tolerated:   a. You may resume regular (light) daily activities beginning the next day--such as daily self-care, walking, climbing stairs--gradually increasing activities as tolerated.  If you can walk 30 minutes without difficulty, it is safe to try more intense activity such as jogging, treadmill, bicycling, low-impact aerobics, swimming, etc. b. Save the most intensive and strenuous activity for last such as sit-ups, heavy lifting, contact sports, etc  Refrain from any heavy lifting or straining until you are off narcotics for pain control.   c. DO NOT PUSH THROUGH PAIN.  Let pain be your guide: If it hurts to do something, don't do it.  Pain is your body warning you to avoid that activity for another week until the pain goes down. d. You may drive when you are no longer taking prescription pain medication, you can comfortably wear a seatbelt, and you  can safely maneuver your car and apply brakes. e. Dennis Bast may have sexual intercourse when it is comfortable.  8. FOLLOW UP in our office a. Please call [facility] at [phone] to set up an appointment to see your surgeon in the office for a follow-up appointment approximately 2-3 weeks after your surgery. b. Make sure that you call for this appointment the day you arrive home to insure a convenient appointment  time. 10. IF YOU HAVE DISABILITY OR FAMILY LEAVE FORMS, BRING THEM TO THE OFFICE FOR PROCESSING.  DO NOT GIVE THEM TO YOUR DOCTOR.   WHEN TO CALL us [phone]: 1. Poor pain control 2. Reactions / problems with new medications (rash/itching, nausea, etc)  3. Fever over 101.5 F (38.5 C) 4. Inability to urinate 5. Nausea and/or vomiting 6. Worsening swelling or bruising 7. Continued bleeding from incision. 8. Increased pain, redness, or drainage from the incision   The clinic staff is available to answer your questions during regular business hours (8:30am-5pm).  Please dont hesitate to call and ask to speak to one of our nurses for clinical concerns.   If you have a medical emergency, go to the nearest emergency room or call 911.  A surgeon from [facility] is always on call at the [facility], [address], [place], [place]  [address] ? MAIN: [phone] ? TOLL FREE: [phone] ?  FAX [phone] [email or web address]

## 2015-01-06 NOTE — Progress Notes (Signed)
  Subjective: She is no better, no bm for 2 days.  She has pain with any food.  Pain medicine makes it better Bentyl not started till last PM so she has only had 3 doses so far.  She is no better than admit.  She was having some diarrhea, but now she is constipated. Objective: Vital signs in last 24 hours: Temp:  [98.1 F (36.7 C)-98.2 F (36.8 C)] 98.2 F (36.8 C) (01/19 0542) Pulse Rate:  [79-91] 83 (01/19 0542) Resp:  [18] 18 (01/19 0542) BP: (108-113)/(70-73) 108/70 mmHg (01/19 0542) SpO2:  [99 %-100 %] 99 % (01/19 0542) Last BM Date: 01/03/15 780 PO Afebrile, VSS No labs Intake/Output from previous day: 01/18 0701 - 01/19 0700 In: 2233.5 [P.O.:780; I.V.:1453.5] Out: -  Intake/Output this shift:    General appearance: alert, cooperative and no distress GI: soft pain is in the LLQ, on exam she is soft, few BS, wound is ok.  Lab Results:   Recent Labs  01/04/15 0553  WBC 5.4  HGB 10.8*  HCT 35.1*  PLT 339    BMET  Recent Labs  01/04/15 0553  NA 139  K 3.8  CL 102  CO2 29  GLUCOSE 103*  BUN <5*  CREATININE 0.53  CALCIUM 8.9   PT/INR No results for input(s): LABPROT, INR in the last 72 hours.   Recent Labs Lab 01/02/15 2334  AST 17  ALT 15  ALKPHOS 76  BILITOT 0.3  PROT 8.0  ALBUMIN 3.9     Lipase     Component Value Date/Time   LIPASE 62* 11/21/2014 1240     Studies/Results: No results found.  Medications: . dicyclomine  10 mg Oral TID AC & HS  . feeding supplement (ENSURE COMPLETE)  237 mL Oral BID BM  . feeding supplement (RESOURCE BREEZE)  1 Container Oral BID BM  . folic acid  1 mg Oral Daily  . heparin  5,000 Units Subcutaneous 3 times per day  . norethindrone-ethinyl estradiol  1 tablet Oral Daily  . sulfaSALAzine  500 mg Oral BID    Assessment/Plan Crohn's disease with diversion colitis S/p LOOP ILEOSTOMY JEHUDJSH,70/26/3785 , Dr. [name]  Now with abdominal pain, nausea, diarrhea, and drainage from ileostomy  take down.   Plan:  Continue bentyl, and defer to Dr. Marcello Moores.    LOS: 4 days    Jessica [name] 01/06/2015

## 2015-01-07 NOTE — Telephone Encounter (Signed)
-----   Message from [name], MD sent at 01/06/2015  5:20 PM EST ----- Regarding: appointment Rollene Fare, this pt is being discharged today. Can you , please, let me see her either this Friday 01/09/2014 or nex office  Day/ Thanx  Crohn's colitis

## 2015-01-07 NOTE — Telephone Encounter (Signed)
Cell- [phone]. Called patient and gave her OV on 01/13/15 at 2:00 PM.

## 2015-01-13 ENCOUNTER — Ambulatory Visit (INDEPENDENT_AMBULATORY_CARE_PROVIDER_SITE_OTHER): Payer: Medicaid Other | Admitting: Internal Medicine

## 2015-01-13 ENCOUNTER — Encounter: Payer: Self-pay | Admitting: Internal Medicine

## 2015-01-13 ENCOUNTER — Other Ambulatory Visit (INDEPENDENT_AMBULATORY_CARE_PROVIDER_SITE_OTHER): Payer: Medicaid Other

## 2015-01-13 VITALS — BP 100/60 | HR 70 | Ht 66.0 in | Wt 159.0 lb

## 2015-01-13 DIAGNOSIS — K5289 Other specified noninfective gastroenteritis and colitis: Secondary | ICD-10-CM | POA: Diagnosis not present

## 2015-01-13 DIAGNOSIS — K50918 Crohn's disease, unspecified, with other complication: Secondary | ICD-10-CM | POA: Diagnosis not present

## 2015-01-13 LAB — CBC WITH DIFFERENTIAL/PLATELET
Basophils Absolute: 0 10*3/uL (ref 0.0–0.1)
Basophils Relative: 0.5 % (ref 0.0–3.0)
Eosinophils Absolute: 0.1 10*3/uL (ref 0.0–0.7)
Eosinophils Relative: 0.7 % (ref 0.0–5.0)
HCT: 35 % — ABNORMAL LOW (ref 36.0–46.0)
Hemoglobin: 11.8 g/dL — ABNORMAL LOW (ref 12.0–15.0)
Lymphocytes Relative: 20.8 % (ref 12.0–46.0)
Lymphs Abs: 1.6 10*3/uL (ref 0.7–4.0)
MCHC: 33.9 g/dL (ref 30.0–36.0)
MCV: 84.1 fl (ref 78.0–100.0)
Monocytes Absolute: 0.7 10*3/uL (ref 0.1–1.0)
Monocytes Relative: 9.1 % (ref 3.0–12.0)
Neutro Abs: 5.2 10*3/uL (ref 1.4–7.7)
Neutrophils Relative %: 68.9 % (ref 43.0–77.0)
Platelets: 372 10*3/uL (ref 150.0–400.0)
RBC: 4.16 Mil/uL (ref 3.87–5.11)
RDW: 14.7 % — ABNORMAL HIGH (ref 11.5–14.6)
WBC: 7.5 10*3/uL (ref 4.5–10.5)

## 2015-01-13 LAB — SEDIMENTATION RATE: Sed Rate: 47 mm/hr — ABNORMAL HIGH (ref 0–22)

## 2015-01-13 MED ORDER — OXYCODONE-ACETAMINOPHEN 5-325 MG PO TABS: ORAL_TABLET | ORAL | Status: DC

## 2015-01-13 MED ORDER — MOVIPREP 100 G PO SOLR: 1.0000 | Freq: Once | ORAL | Status: DC

## 2015-01-13 MED ORDER — ONDANSETRON HCL 4 MG PO TABS: ORAL_TABLET | ORAL | Status: DC

## 2015-01-13 MED ORDER — OMEPRAZOLE 20 MG PO CPDR: 20.0000 mg | DELAYED_RELEASE_CAPSULE | Freq: Every day | ORAL | Status: DC

## 2015-01-13 NOTE — Patient Instructions (Addendum)
Dr Joyice Faster  Your physician has requested that you go to the basement for lab work before leaving today:  We have sent the following medications to your pharmacy for you to pick up at your convenience:  Zofran, Prilosec  You have been scheduled for an endoscopy and colonoscopy. Please follow the written instructions given to you at your visit today. Please pick up your prep at the pharmacy within the next 1-3 days. If you use inhalers (even only as needed), please bring them with you on the day of your procedure.

## 2015-01-13 NOTE — Progress Notes (Signed)
Jessica [name] Jan 27, 1996 [id number]  Note: This dictation was prepared with Dragon digital system. Any transcriptional errors that result from this procedure are unintentional.   History of Present Illness: This is a 19 year old African-American  female ,post hospitalization for Crohn's disease in December 2015 for abdominal pain and bloody diarrhea. She was diagnosed with diversioin colitis. She has had Crohn's disease since age 19 and underwent  loop ileostomy at [facility] in 2012  With plans to have it reversed within few months. She finally underwent the reversal of  6 weeks ago. She is  now 6 weeks out of the surgery but is still having nausea, vomiting and abdominal pain. She has lost 10 pounds. She no longer has rectal bleeding. Her stools are soft but she has pain and nausea within an hour of eating. CT scan of the abdomen on January 16 show  oversewn ileostomy mild soft tissue inflammation and wall thickening in the distal ileum, question of ileitis. She currently is only on Azulfidine 500 mg twice a day and dicyclomine 10 mg before meals. She denies fever    Past Medical History  Diagnosis Date  . Crohn's disease   . Anemia   . Ileostomy in place   . Osteoporosis     Past Surgical History  Procedure Laterality Date  . Small intestine surgery    . Incision and drainage Left     arm  . Dilation and evacuation N/A 10/31/2014    Procedure: DILATATION AND EVACUATION;  Surgeon: Lahoma Crocker, MD;  Location: [facility];  Service: Gynecology;  Laterality: N/A;  . Flexible sigmoidoscopy N/A 11/04/2014    Procedure: FLEXIBLE SIGMOIDOSCOPY;  Surgeon: [name], MD;  Location: [facility];  Service: Endoscopy;  Laterality: N/A;  . Ileostomy closure N/A 12/18/2014    Procedure: LOOP ILEOSTOMY REVERSAL;  Surgeon: [name], MD;  Location: [facility];  Service: General;  Laterality: N/A;    No Known Allergies  Family history and social history have been  reviewed.  Review of Systems: Positive for abdominal pain. Nausea vomiting pain with eating. Weight loss of 10 pounds  The remainder of the 10 point ROS is negative except as outlined in the H&P  Physical Exam: General Appearance Well developed, in no distress Eyes  Non icteric  HEENT  Non traumatic, normocephalic  Mouth No lesion, tongue papillated, no cheilosis Neck Supple without adenopathy, thyroid not enlarged, no carotid bruits, no JVD Lungs Clear to auscultation bilaterally COR Normal S1, normal S2, regular rhythm, no murmur, quiet precordium Abdomen soft but diffusely tender. Well-healed ileostomy scar right middle quadrant. Normoactive bowel sounds. No tympany. No palpable mass Rectal soft Hemoccult positive stool Extremities  No pedal edema Skin No lesions Neurological Alert and oriented x 3 Psychological Normal mood and affect  Assessment and Plan:   19 year old African-American female with persistent abdominal pain, nausea, vomiting 6 weeks post takedown of  loop ileostomy for Crohn's disease since age 19. Recent diversion colitis. Rule out ongoing  Crohn's ileitis. We will proceed with upper endoscopy and colonoscopy. We will give Zofran 4 mg when necessary and refill her oxycodone. We will start Prilosec 20 mg daily.and check CBC, sedimentation rate and metabolic  panel. Depending on the results we will consider Entecort or biologic als for possible recurrent Crohn's disease. I will see her in 3-4 weeks    Delfin Edis @TODAY (<PARAMETER> error)@

## 2015-01-14 LAB — COMPREHENSIVE METABOLIC PANEL
ALT: 42 U/L — ABNORMAL HIGH (ref 0–35)
AST: 24 U/L (ref 0–37)
Albumin: 4 g/dL (ref 3.5–5.2)
Alkaline Phosphatase: 90 U/L (ref 39–117)
BUN: 7 mg/dL (ref 6–23)
CO2: 26 mEq/L (ref 19–32)
Calcium: 9.3 mg/dL (ref 8.4–10.5)
Chloride: 105 mEq/L (ref 96–112)
Creatinine, Ser: 0.48 mg/dL (ref 0.40–1.20)
GFR: 211.57 mL/min (ref >60.00)
Glucose, Bld: 87 mg/dL (ref 70–99)
Potassium: 4 mEq/L (ref 3.5–5.1)
Sodium: 137 mEq/L (ref 135–145)
Total Bilirubin: 0.3 mg/dL (ref 0.2–1.2)
Total Protein: 8.1 g/dL (ref 6.0–8.3)

## 2015-01-15 ENCOUNTER — Encounter (HOSPITAL_COMMUNITY): Payer: Self-pay | Admitting: *Deleted

## 2015-01-22 ENCOUNTER — Encounter (HOSPITAL_COMMUNITY): Payer: Self-pay

## 2015-01-22 ENCOUNTER — Encounter: Payer: Self-pay | Admitting: *Deleted

## 2015-01-22 ENCOUNTER — Ambulatory Visit (HOSPITAL_COMMUNITY): Payer: Medicaid Other | Admitting: Anesthesiology

## 2015-01-22 ENCOUNTER — Other Ambulatory Visit: Payer: Self-pay | Admitting: *Deleted

## 2015-01-22 ENCOUNTER — Encounter (HOSPITAL_COMMUNITY)
Admission: RE | Disposition: A | Discharge status: Home / Self Care | Payer: Self-pay | Source: Ambulatory Visit | Attending: Internal Medicine

## 2015-01-22 ENCOUNTER — Telehealth: Payer: Self-pay | Admitting: *Deleted

## 2015-01-22 ENCOUNTER — Ambulatory Visit (HOSPITAL_COMMUNITY)
Admission: RE | Admit: 2015-01-22 | Discharge: 2015-01-22 | Disposition: A | Discharge status: Home / Self Care | Payer: Medicaid Other | Source: Ambulatory Visit | Attending: Internal Medicine | Admitting: Internal Medicine

## 2015-01-22 DIAGNOSIS — K5660 Unspecified intestinal obstruction: Secondary | ICD-10-CM | POA: Insufficient documentation

## 2015-01-22 DIAGNOSIS — R1114 Bilious vomiting: Secondary | ICD-10-CM

## 2015-01-22 DIAGNOSIS — M810 Age-related osteoporosis without current pathological fracture: Secondary | ICD-10-CM | POA: Insufficient documentation

## 2015-01-22 DIAGNOSIS — R112 Nausea with vomiting, unspecified: Secondary | ICD-10-CM

## 2015-01-22 DIAGNOSIS — K5000 Crohn's disease of small intestine without complications: Secondary | ICD-10-CM | POA: Diagnosis present

## 2015-01-22 DIAGNOSIS — K50119 Crohn's disease of large intestine with unspecified complications: Secondary | ICD-10-CM

## 2015-01-22 DIAGNOSIS — K50012 Crohn's disease of small intestine with intestinal obstruction: Secondary | ICD-10-CM

## 2015-01-22 DIAGNOSIS — R197 Diarrhea, unspecified: Secondary | ICD-10-CM

## 2015-01-22 HISTORY — PX: ESOPHAGOGASTRODUODENOSCOPY: SHX5428

## 2015-01-22 HISTORY — PX: COLONOSCOPY: SHX5424

## 2015-01-22 SURGERY — COLONOSCOPY WITH PROPOFOL
Anesthesia: Monitor Anesthesia Care

## 2015-01-22 SURGERY — EGD (ESOPHAGOGASTRODUODENOSCOPY)
Anesthesia: Monitor Anesthesia Care

## 2015-01-22 SURGERY — EGD (ESOPHAGOGASTRODUODENOSCOPY)
Anesthesia: Moderate Sedation

## 2015-01-22 MED ORDER — BUTAMBEN-TETRACAINE-BENZOCAINE 2-2-14 % EX AERO
INHALATION_SPRAY | CUTANEOUS | Status: DC | PRN
  Administered 2015-01-22: 2 via TOPICAL

## 2015-01-22 MED ORDER — BUDESONIDE 3 MG PO CP24: 9.0000 mg | ORAL_CAPSULE | Freq: Every day | ORAL | Status: DC

## 2015-01-22 MED ORDER — PROPOFOL 10 MG/ML IV BOLUS
INTRAVENOUS | Status: AC
  Filled 2015-01-22: qty 20

## 2015-01-22 MED ORDER — ONDANSETRON HCL 4 MG/2ML IJ SOLN
INTRAMUSCULAR | Status: AC
  Filled 2015-01-22: qty 2

## 2015-01-22 MED ORDER — LACTATED RINGERS IV SOLN
INTRAVENOUS | Status: DC | PRN
  Administered 2015-01-22: 12:00:00 via INTRAVENOUS

## 2015-01-22 MED ORDER — SODIUM CHLORIDE 0.9 % IV SOLN: INTRAVENOUS | Status: DC

## 2015-01-22 MED ORDER — LIDOCAINE HCL (CARDIAC) 20 MG/ML IV SOLN
INTRAVENOUS | Status: DC | PRN
  Administered 2015-01-22: 100 mg via INTRAVENOUS

## 2015-01-22 MED ORDER — ONDANSETRON HCL 4 MG/2ML IJ SOLN
4.0000 mg | Freq: Once | INTRAMUSCULAR | Status: AC
  Administered 2015-01-22: 4 mg via INTRAVENOUS

## 2015-01-22 MED ORDER — LIDOCAINE HCL (CARDIAC) 20 MG/ML IV SOLN
INTRAVENOUS | Status: AC
  Filled 2015-01-22: qty 5

## 2015-01-22 MED ORDER — PROPOFOL INFUSION 10 MG/ML OPTIME
INTRAVENOUS | Status: DC | PRN
  Administered 2015-01-22: 140 ug/kg/min via INTRAVENOUS

## 2015-01-22 NOTE — Telephone Encounter (Signed)
Scheduled SBFT for patient on 01/29/15 at [facility] radiology at 9:30 AM NPO after midnight.Nicole Kindred)

## 2015-01-22 NOTE — Anesthesia Procedure Notes (Signed)
Procedure Name: MAC Date/Time: 01/22/2015 12:15 PM Performed by: Carleene Cooper A Pre-anesthesia Checklist: Patient identified, Timeout performed, Emergency Drugs available, Suction available and Patient being monitored Patient Re-evaluated:Patient Re-evaluated prior to inductionOxygen Delivery Method: Nasal cannula Dental Injury: Teeth and Oropharynx as per pre-operative assessment

## 2015-01-22 NOTE — Discharge Instructions (Signed)
Gastrointestinal Endoscopy, Care After °Refer to this sheet in the next few weeks. These instructions provide you with information on caring for yourself after your procedure. Your caregiver may also give you more specific instructions. Your treatment has been planned according to current medical practices, but problems sometimes occur. Call your caregiver if you have any problems or questions after your procedure. °HOME CARE INSTRUCTIONS °· If you were given medicine to help you relax (sedative), do not drive, operate machinery, or sign important documents for 24 hours. °· Avoid alcohol and hot or warm beverages for the first 24 hours after the procedure. °· Only take over-the-counter or prescription medicines for pain, discomfort, or fever as directed by your caregiver. You may resume taking your normal medicines unless your caregiver tells you otherwise. Ask your caregiver when you may resume taking medicines that may cause bleeding, such as aspirin, clopidogrel, or warfarin. °· You may return to your normal diet and activities on the day after your procedure, or as directed by your caregiver. Walking may help to reduce any bloated feeling in your abdomen. °· Drink enough fluids to keep your urine clear or pale yellow. °· You may gargle with salt water if you have a sore throat. °SEEK IMMEDIATE MEDICAL CARE IF: °· You have severe nausea or vomiting. °· You have severe abdominal pain, abdominal cramps that last longer than 6 hours, or abdominal swelling (distention). °· You have severe shoulder or back pain. °· You have trouble swallowing. °· You have shortness of breath, your breathing is shallow, or you are breathing faster than normal. °· You have a fever or a rapid heartbeat. °· You vomit blood or material that looks like coffee grounds. °· You have bloody, black, or tarry stools. °MAKE SURE YOU: °· Understand these instructions. °· Will watch your condition. °· Will get help right away if you are not doing  well or get worse. °Document Released: 07/19/2004 Document Revised: 04/21/2014 Document Reviewed: 03/06/2012 °ExitCare® Patient Information ©2015 [employer]. This information is not intended to replace advice given to you by your health care provider. Make sure you discuss any questions you have with your health care provider. ° °

## 2015-01-22 NOTE — Transfer of Care (Signed)
Immediate Anesthesia Transfer of Care Note  Patient: Jessica [name]  Procedure(s) Performed: Procedure(s): ESOPHAGOGASTRODUODENOSCOPY (EGD) (N/A) COLONOSCOPY (N/A)  Patient Location: PACU and Endoscopy Unit  Anesthesia Type:MAC  Level of Consciousness: awake, alert , oriented and patient cooperative  Airway & Oxygen Therapy: Patient Spontanous Breathing and Patient connected to nasal cannula oxygen  Post-op Assessment: Report given to RN, Post -op Vital signs reviewed and stable and Patient moving all extremities  Post vital signs: Reviewed and stable  Last Vitals:  Filed Vitals:   01/22/15 1300  BP: 112/72  Pulse:   Temp:   Resp: 18    Complications: No apparent anesthesia complications

## 2015-01-22 NOTE — H&P (View-Only) (Signed)
Jessica [name] 10-11-1996 [id number]  Note: This dictation was prepared with Dragon digital system. Any transcriptional errors that result from this procedure are unintentional.   History of Present Illness: This is a 19 year old African-American  female ,post hospitalization for Crohn's disease in December 21 for abdominal pain and bloody diarrhea. She was diagnosed with diversioin colitis. She has had Crohn's disease since age 41 and underwent  loop ileostomy at [facility] in 2012  With plans to have it reversed within few months. She finally underwent the reversal of  6 weeks ago. She is  now 6 weeks out of the surgery but is still having nausea, vomiting and abdominal pain. She has lost 10 pounds. She no longer has rectal bleeding. Her stools are soft but she has pain and nausea within an hour of eating. CT scan of the abdomen on January 16 show  oversewn ileostomy mild soft tissue inflammation and wall thickening in the distal ileum, question of ileitis. She currently is only on Azulfidine 500 mg twice a day and dicyclomine 10 mg before meals. She denies fever    Past Medical History  Diagnosis Date  . Crohn's disease   . Anemia   . Ileostomy in place   . Osteoporosis     Past Surgical History  Procedure Laterality Date  . Small intestine surgery    . Incision and drainage Left     arm  . Dilation and evacuation N/A 10/31/2014    Procedure: DILATATION AND EVACUATION;  Surgeon: Lahoma Crocker, MD;  Location: [facility];  Service: Gynecology;  Laterality: N/A;  . Flexible sigmoidoscopy N/A 11/04/2014    Procedure: FLEXIBLE SIGMOIDOSCOPY;  Surgeon: [name], MD;  Location: [facility];  Service: Endoscopy;  Laterality: N/A;  . Ileostomy closure N/A 12/18/2014    Procedure: LOOP ILEOSTOMY REVERSAL;  Surgeon: [name], MD;  Location: [facility];  Service: General;  Laterality: N/A;    No Known Allergies  Family history and social history have been  reviewed.  Review of Systems: Positive for abdominal pain. Nausea vomiting pain with eating. Weight loss of 10 pounds  The remainder of the 10 point ROS is negative except as outlined in the H&P  Physical Exam: General Appearance Well developed, in no distress Eyes  Non icteric  HEENT  Non traumatic, normocephalic  Mouth No lesion, tongue papillated, no cheilosis Neck Supple without adenopathy, thyroid not enlarged, no carotid bruits, no JVD Lungs Clear to auscultation bilaterally COR Normal S1, normal S2, regular rhythm, no murmur, quiet precordium Abdomen soft but diffusely tender. Well-healed ileostomy scar right middle quadrant. Normoactive bowel sounds. No tympany. No palpable mass Rectal soft Hemoccult positive stool Extremities  No pedal edema Skin No lesions Neurological Alert and oriented x 3 Psychological Normal mood and affect  Assessment and Plan:   19 year old African-American female with persistent abdominal pain, nausea, vomiting 6 weeks post takedown of  loop ileostomy for Crohn's disease since age 19. Recent diversion colitis. Rule out ongoing  Crohn's ileitis. We will proceed with upper endoscopy and colonoscopy. We will give Zofran 4 mg when necessary and refill her oxycodone. We will start Prilosec 20 mg daily.and check CBC, sedimentation rate and metabolic  panel. Depending on the results we will consider Entecort or biologic als for possible recurrent Crohn's disease. I will see her in 3-4 weeks    Delfin Edis @TODAY (<PARAMETER> error)@

## 2015-01-22 NOTE — Op Note (Signed)
[facility], [address]   COLONOSCOPY PROCEDURE REPORT  PATIENT: Jessica [name], Jessica [name]  MR#: [id number] BIRTHDATE: May 06, 1996 , 18  yrs. old GENDER: female ENDOSCOPIST: [name], MD REFERRED BY:Dr [name] PROCEDURE DATE:  01/22/2015 PROCEDURE:   Colonoscopy with biopsy First Screening Colonoscopy - Avg.  risk and is 50 yrs.  old or older - No.  Prior Negative Screening - Now for repeat screening. N/A  History of Adenoma - Now for follow-up colonoscopy & has been > or = to 3 yrs.  N/A  Polyps Removed Today? No.  Polyps Removed Today? No.  Recommend repeat exam, <10 yrs? Polyps Removed Today? No.  Recommend repeat exam, <10 yrs? Yes.  Polyps Removed Today? No.  Recommend repeat exam, <10 yrs? Yes.  No reason given. ASA CLASS:   Class II INDICATIONS:Crohn's disease of the small bowel and the colon. Recent takedown of ileostomy.  Continued abdominal pain and diarrhea, hx of diversion colitis. MEDICATIONS: Monitored anesthesia care  DESCRIPTION OF PROCEDURE:   After the risks benefits and alternatives of the procedure were thoroughly explained, informed consent was obtained.  The digital rectal exam revealed no abnormalities of the rectum.   The Pentax Ped Colon [id number] endoscope was introduced through the anus and advanced to the ileum. No adverse events experienced.   The quality of the prep was Moviprep fair  The instrument was then slowly withdrawn as the colon was fully examined.      COLON FINDINGS: There was evidence of a prior ileocolonic surgical anastomosis. the colon mucosa appeared normal. There was no evidence of double vision colitis. There was a mild nodularity in the rectosigmoid colon which was nonspecific and was biopsied to rule out inflammatory bowel disease. Descending colon, transverse colon and ascendingcolon were unremarkable. Ileocolic anastomosis showed at least one aphthous ulcer which was biopsied.  Anastomosis was widely patent. Distal ileum was entered without difficulty and showed multiple small ulcerations consistent with Crohn's ileitis. About 15-20 cm of the distal ileum was examined and there was no stricture. Multiple biopsies of the distal ileum were obtained,separate biopsies were obtained from the distal ileum, from the  anastomosis and random biopsies of the colon ,and separate biopsies from rectosigmoid.      The time to cecum=12. minutes 36 seconds.  Withdrawal time=12 minutes 58 seconds.  The scope was withdrawn and the procedure completed. COMPLICATIONS: There were no immediate complications.  ENDOSCOPIC IMPRESSION: There was evidence of a prior ileocolonic surgical anastomosis ileitis consistent with recurrent Crohn's disease. Status post biopsies Essentially normal-appearing colon to the anastomosis. Status post random biopsies as well as separate biopsies from the rectosigmoid  RECOMMENDATIONS: 1.  Await pathology results 2.  Low residue diet than liquids Small bowel follow-through to rule out obstruction Refill Zofran and Percocet Begin Entocort 9 mg daily Office visit within 2 weeks discussed possibility of biologicals  eSigned:  [name], MD 01/22/2015 1:43 PM   cc:   PATIENT NAME:  Jessica [name], Jessica [name] MR#: [id number]

## 2015-01-22 NOTE — Op Note (Signed)
[place], [address]   ENDOSCOPY PROCEDURE REPORT  PATIENT: Jessica, [name]  MR#: [id number] BIRTHDATE: Mar 08, 1996 , 18  yrs. old GENDER: female ENDOSCOPIST: [name], MD REFERRED BY:  [name], M.D. PROCEDURE DATE:  01/22/2015 PROCEDURE:  EGD w/ biopsy ASA CLASS:     Class II INDICATIONS:  vomiting, epigastric pain, and history of Crohn's disease.  Status post takedown of ileostomy 2 months ago.  Patient continues to have vomiting and weight loss. MEDICATIONS: Monitored anesthesia care TOPICAL ANESTHETIC: none  DESCRIPTION OF PROCEDURE: After the risks benefits and alternatives of the procedure were thoroughly explained, informed consent was obtained.  The    endoscope was introduced through the mouth and advanced to the second portion of the duodenum ,     The instrument was slowly withdrawn as the mucosa was fully examined.    Esophagus:[ proximal, mid and distal esophageal mucosa appeared normal. Squamocolumnar junction was normal. There was no hiatal hernia or stricture Stomach: gastric folds were unremarkable. 200 cc of bilious material was aspirated from the stomach. Gastric antrum and pyloric outlet was normal. Retroflexion of the endoscope revealed normal fundus and cardia  Duodenum: duodenal bulb and descending duodenum was normal. There was no accumulation of fluid in the descending duodenum. Biopsies were taken from second portion duodenum to rule out Crohn's disease        The scope was then withdrawn from the patient and the procedure completed.  COMPLICATIONS:  ENDOSCOPIC IMPRESSION: 1.essentially normal upper endoscopy of esophagus, stomach and duodenum 2. Biopsies from second portion of duodenum 3. 200 cc of retained bilious material, nonspecific amount about could indicate partial small bowel obstruction  RECOMMENDATIONS: small bowel follow-through Full liquid diet Continue Zofran and  Percocet proceed with colonoscopy  REPEAT EXAM: no  eSigned:  [name], MD 01/22/2015 1:32 PM    CC:

## 2015-01-22 NOTE — Anesthesia Preprocedure Evaluation (Addendum)
Anesthesia Evaluation  Patient identified by MRN, date of birth, ID band Patient awake    Reviewed: Allergy & Precautions, H&P , NPO status , Patient's Chart, lab work & pertinent test results  History of Anesthesia Complications Negative for: history of anesthetic complications  Airway Mallampati: II  TM Distance: >3 FB Neck ROM: full    Dental no notable dental hx. (+) Teeth Intact, Dental Advisory Given   Pulmonary neg pulmonary ROS,  breath sounds clear to auscultation  Pulmonary exam normal       Cardiovascular Exercise Tolerance: Good negative cardio ROS  Rhythm:regular Rate:Normal     Neuro/Psych negative neurological ROS  negative psych ROS   GI/Hepatic Neg liver ROS, Crohn's disease   Endo/Other  negative endocrine ROS  Renal/GU negative Renal ROS  negative genitourinary   Musculoskeletal   Abdominal   Peds  Hematology negative hematology ROS (+) anemia ,   Anesthesia Other Findings crohns disease  Reproductive/Obstetrics negative OB ROS                             Anesthesia Physical  Anesthesia Plan  ASA: II  Anesthesia Plan: MAC   Post-op Pain Management:    Induction: Intravenous  Airway Management Planned: Nasal Cannula and Natural Airway  Additional Equipment:   Intra-op Plan:   Post-operative Plan:   Informed Consent: I have reviewed the patients History and Physical, chart, labs and discussed the procedure including the risks, benefits and alternatives for the proposed anesthesia with the patient or authorized representative who has indicated his/her understanding and acceptance.     Plan Discussed with: CRNA and Surgeon  Anesthesia Plan Comments:        Anesthesia Quick Evaluation

## 2015-01-22 NOTE — Anesthesia Postprocedure Evaluation (Signed)
  Anesthesia Post-op Note  Patient: Jessica [name]  Procedure(s) Performed: Procedure(s): ESOPHAGOGASTRODUODENOSCOPY (EGD) (N/A) COLONOSCOPY (N/A)  Patient Location: PACU  Anesthesia Type:MAC  Level of Consciousness: awake and alert   Airway and Oxygen Therapy: Patient Spontanous Breathing  Post-op Pain: none  Post-op Assessment: Post-op Vital signs reviewed  Post-op Vital Signs: Reviewed  Last Vitals:  Filed Vitals:   01/22/15 1310  BP: 138/91  Pulse: 67  Temp:   Resp: 19    Complications: No apparent anesthesia complications

## 2015-01-22 NOTE — Interval H&P Note (Signed)
History and Physical Interval Note:  01/22/2015 10:47 AM  Jessica [name]  has presented today for surgery, with the diagnosis of crohns disease; nausea and vomiting  The various methods of treatment have been discussed with the patient and family. After consideration of risks, benefits and other options for treatment, the patient has consented to  Procedure(s): ESOPHAGOGASTRODUODENOSCOPY (EGD) (N/A) COLONOSCOPY (N/A) as a surgical intervention .  The patient's history has been reviewed, patient examined, no change in status, stable for surgery.  I have reviewed the patient's chart and labs.  Questions were answered to the patient's satisfaction.     Delfin Edis

## 2015-01-23 ENCOUNTER — Encounter: Payer: Self-pay | Admitting: Internal Medicine

## 2015-01-23 ENCOUNTER — Encounter (HOSPITAL_COMMUNITY): Payer: Self-pay | Admitting: Internal Medicine

## 2015-01-23 NOTE — Telephone Encounter (Signed)
Patient notified of appointment date and time. 

## 2015-01-27 ENCOUNTER — Encounter: Payer: Self-pay | Admitting: Internal Medicine

## 2015-01-29 ENCOUNTER — Ambulatory Visit (HOSPITAL_COMMUNITY)
Admission: RE | Admit: 2015-01-29 | Discharge: 2015-01-29 | Disposition: A | Discharge status: Home / Self Care | Payer: Medicaid Other | Source: Ambulatory Visit | Attending: Internal Medicine | Admitting: Internal Medicine

## 2015-01-29 DIAGNOSIS — K50119 Crohn's disease of large intestine with unspecified complications: Secondary | ICD-10-CM | POA: Diagnosis not present

## 2015-02-06 ENCOUNTER — Other Ambulatory Visit: Payer: Medicaid Other

## 2015-02-06 ENCOUNTER — Encounter: Payer: Self-pay | Admitting: Internal Medicine

## 2015-02-06 ENCOUNTER — Ambulatory Visit (INDEPENDENT_AMBULATORY_CARE_PROVIDER_SITE_OTHER): Payer: Medicaid Other | Admitting: Internal Medicine

## 2015-02-06 VITALS — BP 112/68 | HR 88 | Ht 66.0 in | Wt 162.1 lb

## 2015-02-06 DIAGNOSIS — K50119 Crohn's disease of large intestine with unspecified complications: Secondary | ICD-10-CM

## 2015-02-06 DIAGNOSIS — K50019 Crohn's disease of small intestine with unspecified complications: Secondary | ICD-10-CM

## 2015-02-06 MED ORDER — DICYCLOMINE HCL 10 MG PO CAPS: 10.0000 mg | ORAL_CAPSULE | Freq: Three times a day (TID) | ORAL | Status: DC | PRN

## 2015-02-06 MED ORDER — PREDNISONE 5 MG PO TABS: 15.0000 mg | ORAL_TABLET | Freq: Every day | ORAL | Status: DC

## 2015-02-06 MED ORDER — OXYCODONE-ACETAMINOPHEN 5-325 MG PO TABS: ORAL_TABLET | ORAL | Status: DC

## 2015-02-06 NOTE — Patient Instructions (Signed)
Today you have been given a printed rx for Percocet to take to the pharmacy.  We have sent the following medications to your pharmacy for you to pick up at your convenience: Prednisone, dicyclomine   Your physician has requested that you go to the basement for the following lab work before leaving today: Hepatitis and TB testing   Jessica Payor, RN spoke with you about Humira program and they will be in touch with you.   I appreciate the opportunity to care for you.

## 2015-02-06 NOTE — Progress Notes (Signed)
Jessica [name] 10-26-1996 [id number]  Note: This dictation was prepared with Dragon digital system. Any transcriptional errors that result from this procedure are unintentional.   History of Present Illness: This is a 19 year old African-American female with the Crohn's disease of the small bowel and colon followed at [facility] until 2009. We have been treating  her only since her recent hospitalization in November 2015 for diversion colitis. She was having diarrhea and abdominal pain. She underwent takedown of ileostomy by Dr. Marcello Moores in January 2016 but continued to have abdominal pain postprandially. Upper endoscopy and colonoscopy on 01/22/2015 revealed normal esophagus,stomach and duodenum and active ileitis in the distal ileum and at the anastomosis. . Random biopsies of the colon also revealed chronic  minimally active colitis throughout the ascending, transverse and descending colon up for sigmoid colon . She has been much improved on budesonide 9 mg daily. She has not vomited since the exam and she has gained 3 pounds. She has 1 bowel movement a day but feel somewhat constipated. She takes Percocet every few days for abdominal pain.    Past Medical History  Diagnosis Date  . Crohn's disease   . Anemia   . Ileostomy in place   . Osteoporosis     Past Surgical History  Procedure Laterality Date  . Small intestine surgery    . Incision and drainage Left     arm  . Dilation and evacuation N/A 10/31/2014    Procedure: DILATATION AND EVACUATION;  Surgeon: Lahoma Crocker, MD;  Location: [facility];  Service: Gynecology;  Laterality: N/A;  . Flexible sigmoidoscopy N/A 11/04/2014    Procedure: FLEXIBLE SIGMOIDOSCOPY;  Surgeon: [name], MD;  Location: [facility] END[facility];  Service: Endoscopy;  Laterality: N/A;  . Ileostomy closure N/A 12/18/2014    Procedure: LOOP ILEOSTOMY REVERSAL;  Surgeon: [name], MD;  Location: [facility];  Service: General;  Laterality: N/A;  .  Esophagogastroduodenoscopy N/A 01/22/2015    Procedure: ESOPHAGOGASTRODUODENOSCOPY (EGD);  Surgeon: [name], MD;  Location: [facility];  Service: Endoscopy;  Laterality: N/A;  . Colonoscopy N/A 01/22/2015    Procedure: COLONOSCOPY;  Surgeon: [name], MD;  Location: [facility]COPY;  Service: Endoscopy;  Laterality: N/A;    No Known Allergies  Family history and social history have been reviewed.  Review of Systems:   The remainder of the 10 point ROS is negative except as outlined in the H&P  Physical Exam: General Appearance Well developed, in no distress Eyes  Non icteric  HEENT  Non traumatic, normocephalic  Mouth No lesion, tongue papillated, no cheilosis Neck Supple without adenopathy, thyroid not enlarged, no carotid bruits, no JVD Lungs Clear to auscultation bilaterally COR Normal S1, normal S2, regular rhythm, no murmur, quiet precordium Abdomen tender at the site of the prior ileostomy. No rebound. No palpable mass. Rest of the abdomen appears normal Rectal not repeated Extremities  No pedal edema Skin No lesions Neurological Alert and oriented x 3 Psychological Normal mood and affect  Assessment and Plan:   19 year old African-American female with long-standing Crohn's colitis and ileitis who has undergone takedown of ileostomy by Dr Marcello Moores in January 2016. She has been found to have active Crohn's disease at the anastomosis,as well as  in the distal ileum and  microscopic disease in her colon. Small bowl follow through confirms inflammatory changes in distal ileum but no obstruction. She is now much improved on Entecort 9 mg daily. We have discussed starting biologicals. Specifically Humira, induction dose  followed by maintenance of 40 mg every 2 weeks.process. In the meantime we will add prednisone 15 mg daily. I'm also considering adding mesalamine. We will obtain TB QuantiFERON test today as well as hepatitis C and B serologies  prior to starting biologicals. She  will receive refill on Bentyl, Percocet and prednisone. I will see her in 6 weeks   Delfin Edis 02/06/2015

## 2015-02-07 LAB — HEPATITIS B SURFACE ANTIGEN: Hepatitis B Surface Ag: NEGATIVE

## 2015-02-07 LAB — HEPATITIS B SURFACE ANTIBODY,QUALITATIVE: Hep B S Ab: NEGATIVE

## 2015-02-07 LAB — HEPATITIS C ANTIBODY: HCV Ab: NEGATIVE

## 2015-02-12 LAB — QUANTIFERON TB GOLD ASSAY (BLOOD)
Mitogen value: 0.13 IU/mL
Quantiferon Nil Value: 0.01 IU/mL
Quantiferon Tb Ag Minus Nil Value: 0 IU/mL
TB Ag value: 0.01 IU/mL

## 2015-02-16 ENCOUNTER — Telehealth: Payer: Self-pay | Admitting: *Deleted

## 2015-02-16 DIAGNOSIS — K50919 Crohn's disease, unspecified, with unspecified complications: Secondary | ICD-10-CM

## 2015-02-16 NOTE — Telephone Encounter (Signed)
Patient notified and she will come for xray.

## 2015-02-16 NOTE — Telephone Encounter (Signed)
[name], MD  Hulan Saas, RN           Yes, please, obtain CXR PA and Lat.. [place] CXR was in 2011.       Previous Messages      Order in [place]. Left a message for patient to call back.

## 2015-02-23 ENCOUNTER — Telehealth: Payer: Self-pay | Admitting: *Deleted

## 2015-02-23 NOTE — Telephone Encounter (Signed)
Spoke with patient and reminded her to come for CXR so we can send in her Humira rx.  She states she will come on Wednesday.

## 2015-02-23 NOTE — Telephone Encounter (Signed)
-----   Message from Hulan Saas, RN sent at 02/16/2015  4:50 PM EST ----- Did patient have CXR?DB

## 2015-02-26 ENCOUNTER — Telehealth: Payer: Self-pay | Admitting: *Deleted

## 2015-02-26 ENCOUNTER — Ambulatory Visit (INDEPENDENT_AMBULATORY_CARE_PROVIDER_SITE_OTHER)
Admission: RE | Admit: 2015-02-26 | Discharge: 2015-02-26 | Disposition: A | Discharge status: Home / Self Care | Payer: Medicaid Other | Source: Ambulatory Visit | Attending: Internal Medicine | Admitting: Internal Medicine

## 2015-02-26 DIAGNOSIS — K50919 Crohn's disease, unspecified, with unspecified complications: Secondary | ICD-10-CM

## 2015-02-26 NOTE — Telephone Encounter (Signed)
Please keep trying to reach her. May be her mother can be reached.

## 2015-02-26 NOTE — Telephone Encounter (Signed)
Dr. Olevia Perches, Wanted to let you know this patient has been called twice to come for her CXR so we can send in her Humira prescrpition. She has not come for the xray.

## 2015-03-03 ENCOUNTER — Telehealth: Payer: Self-pay | Admitting: Internal Medicine

## 2015-03-03 NOTE — Telephone Encounter (Signed)
I spoke with the patient and she has not heard from the [facility] and her Humira will arrive tomorrow.  I spoke with [facility] they will contact the patient and get her enrolled with the [facility] program. I did leave her a voicemail that she should hear from [facility] today.

## 2015-03-05 ENCOUNTER — Telehealth: Payer: Self-pay | Admitting: *Deleted

## 2015-03-05 NOTE — Telephone Encounter (Signed)
Called patient to see if she had heard from the [place]. She has not and she has her Humira. Called [facility] and they will call patient and 3 way with ambassador to get her started.

## 2015-03-09 ENCOUNTER — Telehealth: Payer: Self-pay | Admitting: *Deleted

## 2015-03-09 NOTE — Telephone Encounter (Signed)
Left a message for patient to call back and let me know if she has started her Humira.+

## 2015-03-10 NOTE — Telephone Encounter (Signed)
Patient left a message that her ambassdor came yesterday and she started her Humira.

## 2015-03-10 NOTE — Telephone Encounter (Signed)
Good news! db

## 2015-03-11 ENCOUNTER — Other Ambulatory Visit: Payer: Self-pay | Admitting: Internal Medicine

## 2015-03-24 ENCOUNTER — Telehealth: Payer: Self-pay | Admitting: Internal Medicine

## 2015-03-24 NOTE — Telephone Encounter (Signed)
Spoke with patient and she is doing well with her Humira injections. Scheduled an OV on 04/14/15 at 10:00 AM. She just had her Budesonide refilled. She is asking if she is to taper it or stay on it as ordered. This was her last refill. Please, advise.

## 2015-03-24 NOTE — Telephone Encounter (Signed)
She needs to continue Budesonide till she sees me in the office, 3tabs/day, don't taper yet, I want to be sure the biological takes a full effect before we taper. please refill. Continue  Biological.

## 2015-03-24 NOTE — Telephone Encounter (Signed)
Left a message for patient to call back. 

## 2015-03-25 NOTE — Telephone Encounter (Signed)
Patient notified of recommendations. 

## 2015-04-13 ENCOUNTER — Other Ambulatory Visit: Payer: Self-pay | Admitting: Internal Medicine

## 2015-04-14 ENCOUNTER — Ambulatory Visit: Payer: Medicaid Other | Admitting: Internal Medicine

## 2015-04-18 ENCOUNTER — Other Ambulatory Visit: Payer: Self-pay | Admitting: Internal Medicine

## 2015-04-20 ENCOUNTER — Other Ambulatory Visit: Payer: Self-pay | Admitting: Internal Medicine

## 2015-04-20 NOTE — Telephone Encounter (Signed)
Patient would like a Rx refill for prednisone, 5 mg. Patient takes 3 tablets, by mouth, every day with breakfast. Do you want the patient to continue taking this amount? Her next appointment with you is on 05/15/15 at 1:00 pm. Please advise.

## 2015-05-15 ENCOUNTER — Other Ambulatory Visit (INDEPENDENT_AMBULATORY_CARE_PROVIDER_SITE_OTHER): Payer: Medicaid Other

## 2015-05-15 ENCOUNTER — Ambulatory Visit (INDEPENDENT_AMBULATORY_CARE_PROVIDER_SITE_OTHER): Payer: Medicaid Other | Admitting: Internal Medicine

## 2015-05-15 ENCOUNTER — Encounter: Payer: Self-pay | Admitting: Internal Medicine

## 2015-05-15 VITALS — BP 116/60 | HR 76 | Ht 66.75 in | Wt 181.0 lb

## 2015-05-15 DIAGNOSIS — D899 Disorder involving the immune mechanism, unspecified: Secondary | ICD-10-CM

## 2015-05-15 DIAGNOSIS — D539 Nutritional anemia, unspecified: Secondary | ICD-10-CM

## 2015-05-15 DIAGNOSIS — K50118 Crohn's disease of large intestine with other complication: Secondary | ICD-10-CM

## 2015-05-15 DIAGNOSIS — D849 Immunodeficiency, unspecified: Secondary | ICD-10-CM

## 2015-05-15 LAB — COMPREHENSIVE METABOLIC PANEL
ALT: 12 U/L (ref 0–35)
AST: 12 U/L (ref 0–37)
Albumin: 4.1 g/dL (ref 3.5–5.2)
Alkaline Phosphatase: 56 U/L (ref 39–117)
BUN: 11 mg/dL (ref 6–23)
CO2: 28 mEq/L (ref 19–32)
Calcium: 9.2 mg/dL (ref 8.4–10.5)
Chloride: 102 mEq/L (ref 96–112)
Creatinine, Ser: 0.61 mg/dL (ref 0.40–1.20)
GFR: 159.91 mL/min (ref >60.00)
Glucose, Bld: 75 mg/dL (ref 70–99)
Potassium: 3.9 mEq/L (ref 3.5–5.1)
Sodium: 136 mEq/L (ref 135–145)
Total Bilirubin: 0.2 mg/dL (ref 0.2–1.2)
Total Protein: 7.9 g/dL (ref 6.0–8.3)

## 2015-05-15 LAB — CBC WITH DIFFERENTIAL/PLATELET
Basophils Absolute: 0 10*3/uL (ref 0.0–0.1)
Basophils Relative: 0.2 % (ref 0.0–3.0)
Eosinophils Absolute: 0 10*3/uL (ref 0.0–0.7)
Eosinophils Relative: 0.1 % (ref 0.0–5.0)
HCT: 38 % (ref 36.0–46.0)
Hemoglobin: 12.2 g/dL (ref 12.0–15.0)
Lymphocytes Relative: 11 % — ABNORMAL LOW (ref 12.0–46.0)
Lymphs Abs: 1.6 10*3/uL (ref 0.7–4.0)
MCHC: 32.2 g/dL (ref 30.0–36.0)
MCV: 83.6 fl (ref 78.0–100.0)
Monocytes Absolute: 0.9 10*3/uL (ref 0.1–1.0)
Monocytes Relative: 6 % (ref 3.0–12.0)
Neutro Abs: 11.7 10*3/uL — ABNORMAL HIGH (ref 1.4–7.7)
Neutrophils Relative %: 82.7 % — ABNORMAL HIGH (ref 43.0–77.0)
Platelets: 311 10*3/uL (ref 150.0–400.0)
RBC: 4.54 Mil/uL (ref 3.87–5.11)
RDW: 15.1 % — ABNORMAL HIGH (ref 11.5–14.6)
WBC: 14.2 10*3/uL — ABNORMAL HIGH (ref 4.5–10.5)

## 2015-05-15 LAB — FOLATE: Folate: 15.5 ng/mL (ref >5.9)

## 2015-05-15 LAB — IBC PANEL
Iron: 30 ug/dL — ABNORMAL LOW (ref 42–145)
Saturation Ratios: 5.8 % — ABNORMAL LOW (ref 20.0–50.0)
Transferrin: 367 mg/dL — ABNORMAL HIGH (ref 212.0–360.0)

## 2015-05-15 LAB — FERRITIN: Ferritin: 4.1 ng/mL — ABNORMAL LOW (ref 10.0–291.0)

## 2015-05-15 LAB — VITAMIN B12: Vitamin B-12: 358 pg/mL (ref 211–911)

## 2015-05-15 LAB — SEDIMENTATION RATE: Sed Rate: 29 mm/hr — ABNORMAL HIGH (ref 0–22)

## 2015-05-15 MED ORDER — HYDROCODONE-ACETAMINOPHEN 5-325 MG PO TABS: 1.0000 | ORAL_TABLET | Freq: Four times a day (QID) | ORAL | Status: DC | PRN

## 2015-05-15 MED ORDER — KETOCONAZOLE 2 % EX CREA: 1.0000 "application " | TOPICAL_CREAM | Freq: Two times a day (BID) | CUTANEOUS | Status: DC

## 2015-05-15 NOTE — Patient Instructions (Addendum)
Your physician has requested that you go to the basement for  lab work before leaving today.  We have sent the following medications to your pharmacy for you to pick up at your convenience:ketocanozole.  We have printed the prescription for hydrocodone.  Please decrease your prednisone to 10 mg daily x 2 weeks, then reduce to 5 mg daily x 2 weeks, then discontinue. If you are doing when you decrease your prednisone then you can also decrease your budesonide to 6 mg daily.   Please follow up with Dr. Olevia Perches on 08/14/15 at 2:50NL.  Dr [name]

## 2015-05-15 NOTE — Progress Notes (Signed)
Jessica [name] August 31, 1996 [id number]  Note: This dictation was prepared with Dragon digital system. Any transcriptional errors that result from this procedure are unintentional.   History of Present Illness: This is a 19 year old African-American female with Crohn's disease of the small bowel and colon followed at [facility] till 2009. We have been seeing her only since  hospitalization in November 2015 for diversion colitis. She underwent takedown of ileostomy by Dr Marcello Moores in January 2016. Post operative endoscopy and colonoscopy showed active ileitis. Her upper endoscopy was normal. She was started on budesonide 9 mg daily as well as Humira induction dose on 03/15/2015  followed by maintenance dose of 40 mg every 2 weeks. She is also on prednisone 15 mg daily.and Budesonide 9 mg daily  She is doing very well. She denies abdominal pain. There is occasional diarrhea. She denies rectal bleeding. She has gained almost 20 pounds. She has been tolerating regular diet. She has noticed a slight  pruritic rash on the back of  her neck after  Humira started.    Past Medical History  Diagnosis Date  . Crohn's disease   . Anemia   . Ileostomy in place   . Osteoporosis     Past Surgical History  Procedure Laterality Date  . Small intestine surgery    . Incision and drainage Left     arm  . Dilation and evacuation N/A 10/31/2014    Procedure: DILATATION AND EVACUATION;  Surgeon: Lahoma Crocker, MD;  Location: [facility];  Service: Gynecology;  Laterality: N/A;  . Flexible sigmoidoscopy N/A 11/04/2014    Procedure: FLEXIBLE SIGMOIDOSCOPY;  Surgeon: [name], MD;  Location: [facility] ENDOSCOPY;  Service: Endoscopy;  Laterality: N/A;  . Ileostomy closure N/A 12/18/2014    Procedure: LOOP ILEOSTOMY REVERSAL;  Surgeon: [name], MD;  Location: [facility];  Service: General;  Laterality: N/A;  . Esophagogastroduodenoscopy N/A 01/22/2015    Procedure: ESOPHAGOGASTRODUODENOSCOPY (EGD);  Surgeon: [name], MD;   Location: [facility];  Service: Endoscopy;  Laterality: N/A;  . Colonoscopy N/A 01/22/2015    Procedure: COLONOSCOPY;  Surgeon: [name], MD;  Location: [facility];  Service: Endoscopy;  Laterality: N/A;    No Known Allergies  Family history and social history have been reviewed.  Review of Systems:   The remainder of the 10 point ROS is negative except as outlined in the H&P  Physical Exam: General Appearance Well developed, in no distress, mildly cushingoid Eyes  Non icteric  HEENT  Non traumatic, normocephalic  Mouth No lesion, tongue papillated, no cheilosis Neck Supple without adenopathy, thyroid not enlarged, no carotid bruits, no JVD Lungs Clear to auscultation bilaterally COR Normal S1, normal S2, regular rhythm, no murmur, quiet precordium Abdomen diffusely tender abdomen throughout with normal bowel sounds no distention and no rebound. Liver edge is aty  costal margin. Well-healed colostomy scar. Rectal not done Extremities  No pedal edema Skin No lesions Neurological Alert and oriented x 3 Psychological Normal mood and affect  Assessment and Plan:   19 year old female with  Crohn's disease of the ileum ,  status post takedown of ileostomy 3 months ago. Complete resolution of diversion colitis  Since the reanastomosis. Doing very well on low-dose steroids and biologicals. She is ready to start tapering the steroids. Prednisone from 15 mg a day to 10 mg a day for 2 weeks, followed by 5 mg daily for 2 weeks then 5 mg every other day . If she continues to do well she will  be able to reduce the budesonide to 6 mg daily before she sees me in 3 months.. Today we will refill hydrocodone 5/325 and start ketoconazole for tenia infection in her neck. We will also check CBC, sedimentation rate, iron studies, and metabolic panel    Delfin Edis 05/15/2015

## 2015-05-20 MED ORDER — FERROUS SULFATE 325 (65 FE) MG PO TABS: 325.0000 mg | ORAL_TABLET | Freq: Three times a day (TID) | ORAL | Status: DC

## 2015-05-22 ENCOUNTER — Other Ambulatory Visit: Payer: Self-pay | Admitting: Internal Medicine

## 2015-06-02 ENCOUNTER — Other Ambulatory Visit: Payer: Self-pay | Admitting: Internal Medicine

## 2015-06-13 ENCOUNTER — Other Ambulatory Visit: Payer: Self-pay | Admitting: Internal Medicine

## 2015-06-15 ENCOUNTER — Telehealth: Payer: Self-pay | Admitting: Internal Medicine

## 2015-06-15 DIAGNOSIS — K50118 Crohn's disease of large intestine with other complication: Secondary | ICD-10-CM

## 2015-06-15 MED ORDER — PREDNISONE 10 MG PO TABS: ORAL_TABLET | ORAL | Status: DC

## 2015-06-15 NOTE — Telephone Encounter (Signed)
Patient calling to report LLQ pain for a couple of days. States she had a bowel movement 2 days ago and today. Denies constipation, diarrhea, bleeding or fever. She is on Humira. Her Prednisone is at 10 mg daily. She reports the Vicodin is not helping. She states the Percocet worked for her in the past. Please, advise.

## 2015-06-15 NOTE — Telephone Encounter (Signed)
Patient given recommendations. Rx sent for Prednisone 10 mg.

## 2015-06-15 NOTE — Telephone Encounter (Signed)
Please increase Prednisone to 30 mg/day ( she has 10 mg tablets) x 1 week, 73m/day x 1 week, 20 mg/day x 1 week, 15 mg/ day x 1 week, then 10 mg/day . Please obtain CBC, sed rate, Iron studies ( she should be on daily Iron).Continue Humira,

## 2015-06-23 ENCOUNTER — Other Ambulatory Visit: Payer: Self-pay | Admitting: Internal Medicine

## 2015-06-24 ENCOUNTER — Other Ambulatory Visit: Payer: Self-pay

## 2015-06-24 ENCOUNTER — Telehealth: Payer: Self-pay | Admitting: Internal Medicine

## 2015-06-24 MED ORDER — ADALIMUMAB 40 MG/0.8ML ~~LOC~~ AJKT: 40.0000 mg | AUTO-INJECTOR | SUBCUTANEOUS | Status: DC

## 2015-06-24 NOTE — Telephone Encounter (Signed)
Refill sent to pharmacy for Humira.

## 2015-06-27 ENCOUNTER — Other Ambulatory Visit: Payer: Self-pay | Admitting: Internal Medicine

## 2015-06-27 IMAGING — CR DG SMALL BOWEL
6 series · 6 of 6 positions shown · non-contrast
Comparison: 01/03/2015

CLINICAL DATA: Crohn's disease. Right-sided abdominal pain in
constipation. Ileostomy 3 years ago taken down 2 months ago. Ulcers
in the small bowel on recent endoscopy.

EXAM:
SMALL BOWEL SERIES
TECHNIQUE: Following ingestion of thin barium, serial small bowel images were
obtained including spot views of the terminal ileum.
FLUOROSCOPY TIME:  Radiation Exposure Index (as provided by the
fluoroscopic device):
If the device does not provide the exposure index:
Fluoroscopy Time (in minutes and seconds):  1 minutes, 2 seconds
Number of Acquired Images:  15

[t abdomen supine (1 of 6)]
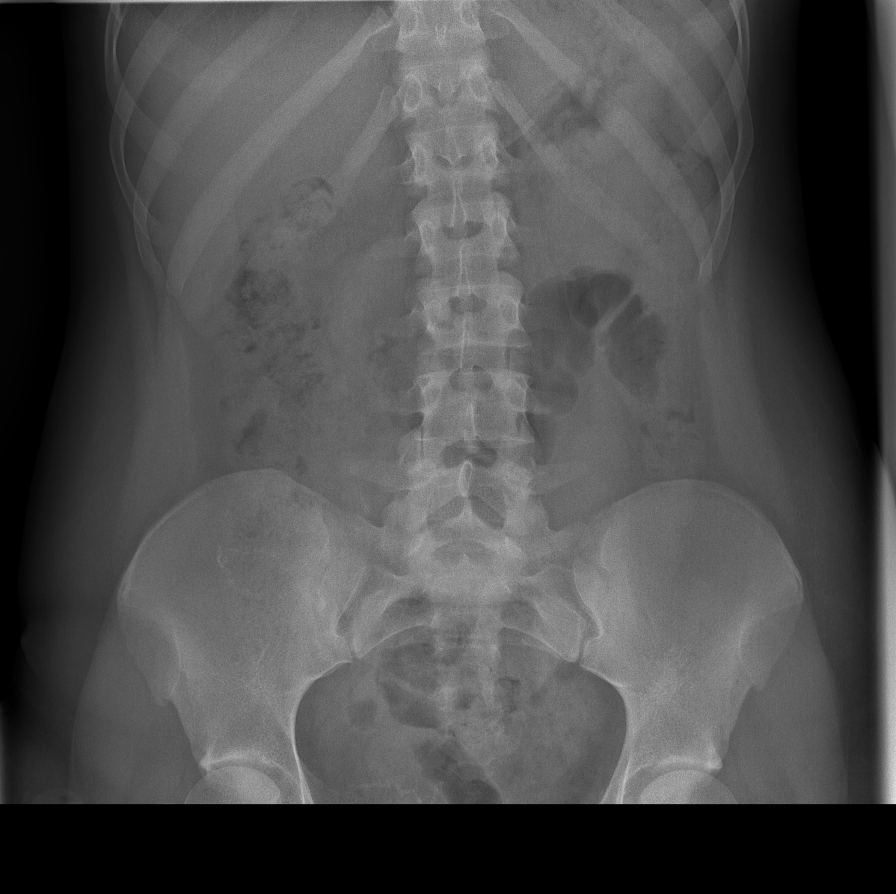

[t abdomen supine (2 of 6)]
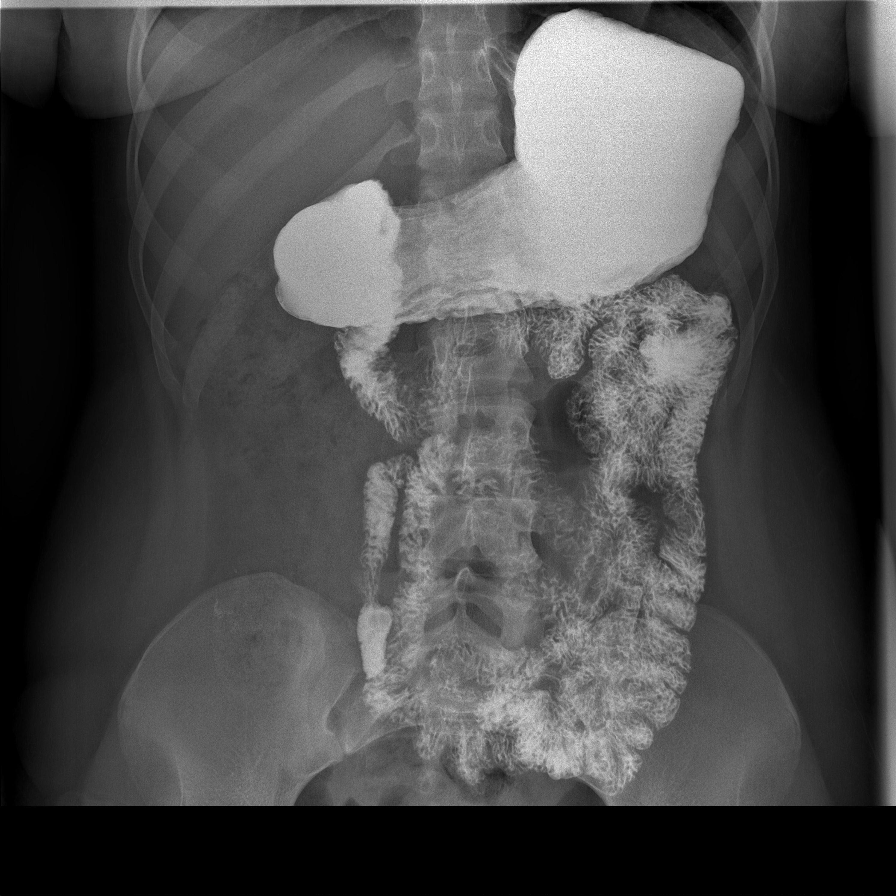

[t abdomen supine (3 of 6)]
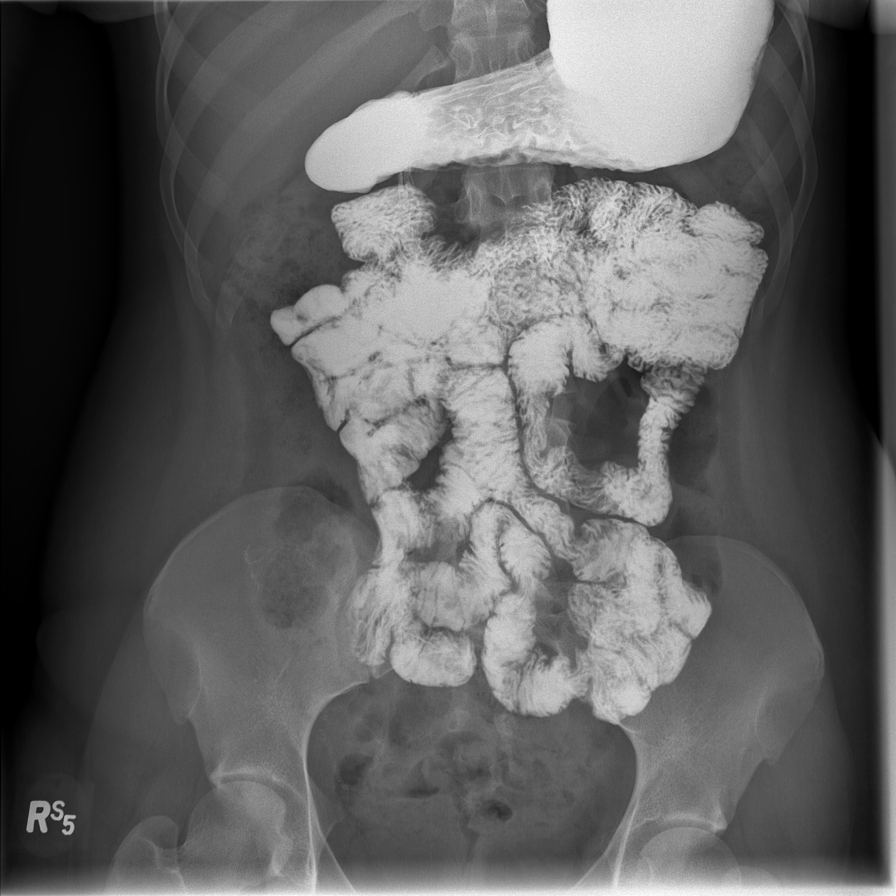

[t abdomen supine (4 of 6)]
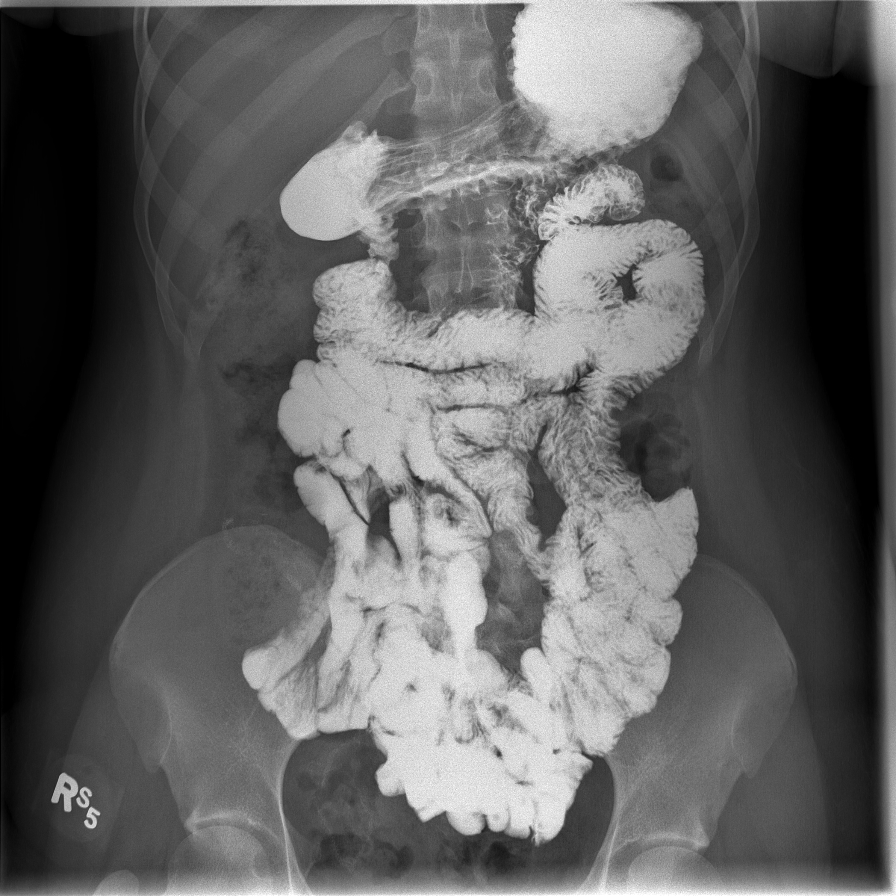

[t abdomen supine (5 of 6)]
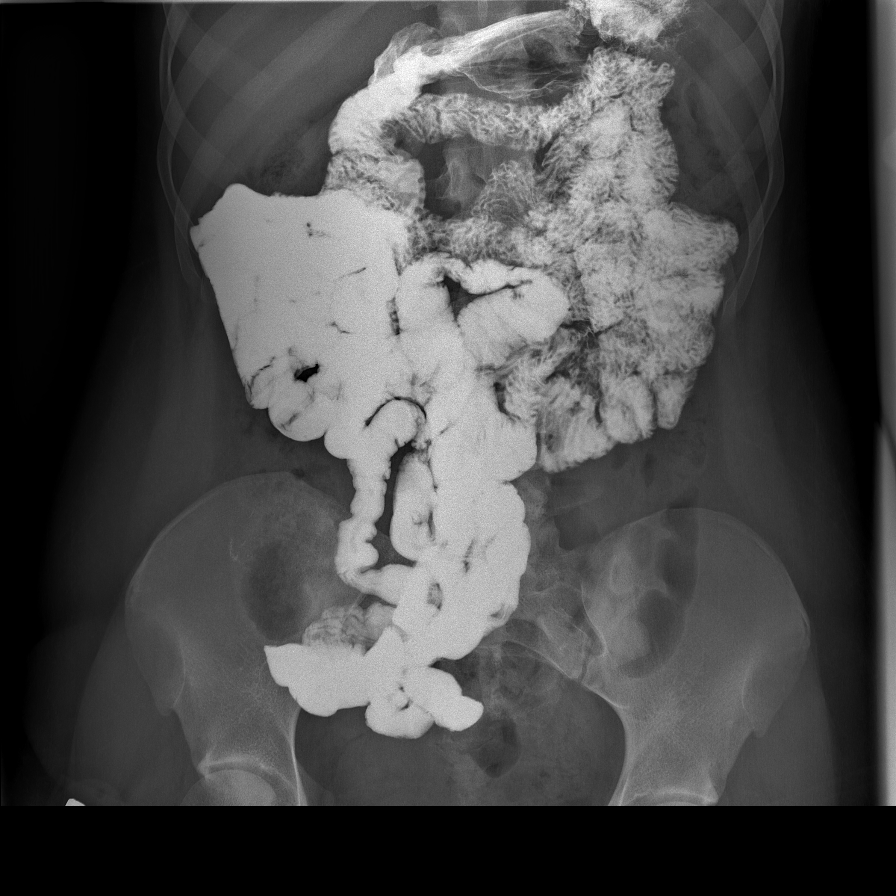

[t abdomen supine (6 of 6)]
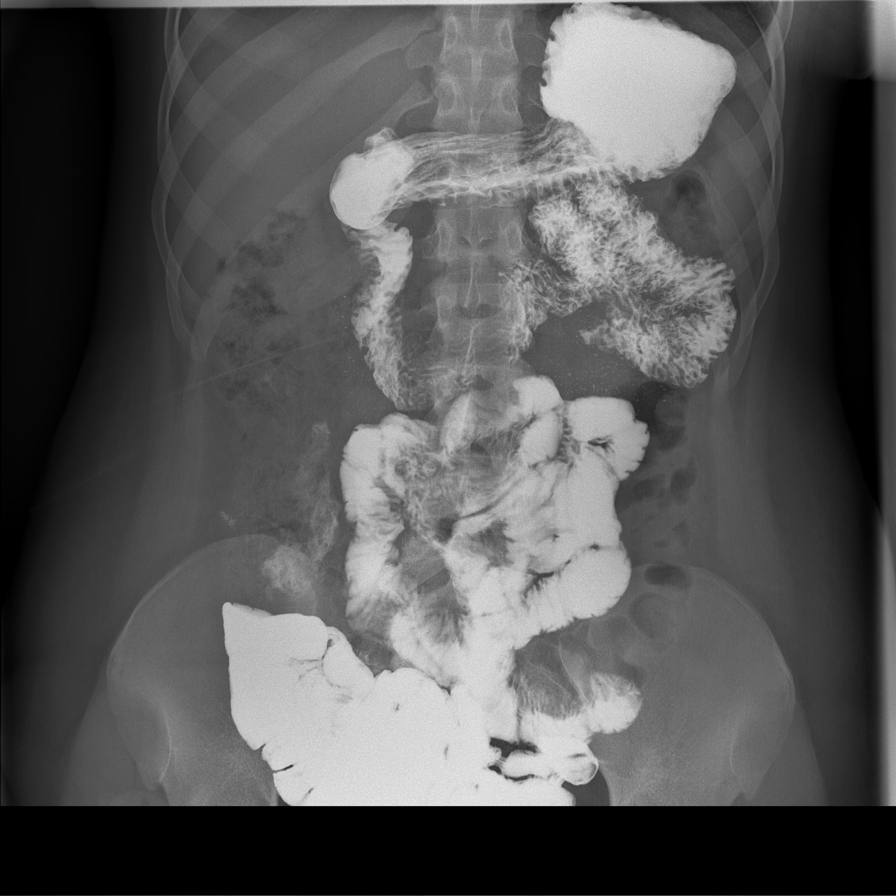

[6 of 6 positions shown; findings below may reference images not displayed]

FINDINGS: Initial KUB demonstrates unremarkable bowel gas pattern. Anastomotic
staple lines in the right abdomen a right pelvis.

Transit time through the small bowel was 2 hours. Expected jejunal
fold pattern and caliber noted. Generally expected fold pattern and
caliber of the ileum. There is some subtle irregularity along the
wall of the cecum which could be due to inflammation but may also
simply be due to underlying stool in this vicinity. At the ileocolic
anastomosis, the ileum has a relatively normal fold pattern as shown
on series [DATE].

Balloon compression was used to assess the small bowel in a
generalized manner. No small bowel polyp or significant small bowel
abnormality is observed. Small bowel loops in the pelvis are bunched
together and as a result difficult to assess even with compression.
IMPRESSION: 1. Currently the ileum adjacent to the ileocolic anastomosis does
not appear ulcerated, thickened, or significantly abnormal. The
remainder the small bowel appears unremarkable, although some of the
pelvic small bowel loops are difficult to fully characterize due to
bunching.
2. Slight wall irregularity in the proximal colon could reflect low
grade inflammation.

## 2015-08-13 ENCOUNTER — Ambulatory Visit: Payer: Medicaid Other | Admitting: Internal Medicine

## 2015-08-14 ENCOUNTER — Ambulatory Visit: Payer: Medicaid Other | Admitting: Internal Medicine

## 2015-08-25 ENCOUNTER — Telehealth: Payer: Self-pay | Admitting: Internal Medicine

## 2015-08-25 NOTE — Telephone Encounter (Signed)
Spoke with patient and she missed her Humira dose yesterday. She will take it today. Then, resume regimen.

## 2015-09-18 ENCOUNTER — Telehealth: Payer: Self-pay | Admitting: Gastroenterology

## 2015-09-18 NOTE — Telephone Encounter (Signed)
Patient just found out she is pregnant. She is on Humira injections and wants to know if she should continue this medication. Please, advise.

## 2015-09-18 NOTE — Telephone Encounter (Signed)
Yes please tell her to continue it. The greatest risk to her baby is a Crohns flare which can lead to premature birth and low birth weight. Humira is safe to use in pregnancy as long as she is tolerating it, but she should see me in clinic to discuss management through her pregnancy. Can you help book her an appointment? thanks

## 2015-09-18 NOTE — Telephone Encounter (Signed)
Patient notified of recommendations. She is already scheduled for an OV.

## 2015-09-18 NOTE — Telephone Encounter (Signed)
Left a message for patient to call back. 

## 2015-09-21 ENCOUNTER — Other Ambulatory Visit: Payer: Medicaid Other

## 2015-09-28 ENCOUNTER — Other Ambulatory Visit: Payer: Medicaid Other

## 2015-10-13 ENCOUNTER — Ambulatory Visit: Payer: Medicaid Other | Admitting: Gastroenterology

## 2015-10-23 ENCOUNTER — Other Ambulatory Visit: Payer: Medicaid Other

## 2015-12-15 ENCOUNTER — Other Ambulatory Visit (INDEPENDENT_AMBULATORY_CARE_PROVIDER_SITE_OTHER): Payer: Medicaid Other

## 2015-12-15 ENCOUNTER — Encounter: Payer: Self-pay | Admitting: Obstetrics

## 2015-12-15 VITALS — BP 137/89 | HR 119 | Temp 98.9°F | Ht 66.0 in | Wt 185.0 lb

## 2015-12-15 DIAGNOSIS — Z3201 Encounter for pregnancy test, result positive: Secondary | ICD-10-CM | POA: Diagnosis not present

## 2015-12-15 DIAGNOSIS — N926 Irregular menstruation, unspecified: Secondary | ICD-10-CM

## 2015-12-15 DIAGNOSIS — O219 Vomiting of pregnancy, unspecified: Secondary | ICD-10-CM

## 2015-12-15 LAB — POCT URINE PREGNANCY: Preg Test, Ur: POSITIVE — AB

## 2015-12-15 MED ORDER — DOXYLAMINE-PYRIDOXINE 10-10 MG PO TBEC: DELAYED_RELEASE_TABLET | ORAL | Status: DC

## 2015-12-15 MED ORDER — VITAFOL ULTRA 29-0.6-0.4-200 MG PO CAPS: 1.0000 | ORAL_CAPSULE | Freq: Every day | ORAL | Status: DC

## 2015-12-15 NOTE — Progress Notes (Signed)
Patient in office for a pregnancy test to confirm pregnancy. Patient states she had a positive home pregnancy test. Pregnancy test in office is positive. Patient states this was an unplanned pregnancy that she does wish to continue.  Patient states she is having a lot of nausea. Patient is encouraged to start prenatal vitamins and to schedule a NOB appointment.   BP 137/89 mmHg  Pulse 119  Temp(Src) 98.9 F (37.2 C)  Ht 5' 6"  (1.676 m)  Wt 185 lb (83.915 kg)  BMI 29.87 kg/m2  LMP 08/17/2015

## 2015-12-16 ENCOUNTER — Other Ambulatory Visit: Payer: Self-pay | Admitting: Obstetrics

## 2015-12-16 DIAGNOSIS — N926 Irregular menstruation, unspecified: Secondary | ICD-10-CM

## 2015-12-16 MED ORDER — VITAFOL ULTRA 29-0.6-0.4-200 MG PO CAPS: 1.0000 | ORAL_CAPSULE | Freq: Every day | ORAL | Status: DC

## 2015-12-18 ENCOUNTER — Ambulatory Visit (INDEPENDENT_AMBULATORY_CARE_PROVIDER_SITE_OTHER): Payer: Medicaid Other | Admitting: Certified Nurse Midwife

## 2015-12-18 ENCOUNTER — Encounter: Payer: Self-pay | Admitting: Certified Nurse Midwife

## 2015-12-18 VITALS — BP 128/88 | HR 99 | Temp 98.8°F | Wt 184.0 lb

## 2015-12-18 DIAGNOSIS — O219 Vomiting of pregnancy, unspecified: Secondary | ICD-10-CM | POA: Diagnosis not present

## 2015-12-18 DIAGNOSIS — O0992 Supervision of high risk pregnancy, unspecified, second trimester: Secondary | ICD-10-CM | POA: Diagnosis not present

## 2015-12-18 DIAGNOSIS — O0990 Supervision of high risk pregnancy, unspecified, unspecified trimester: Secondary | ICD-10-CM | POA: Insufficient documentation

## 2015-12-18 LAB — POCT URINALYSIS DIPSTICK
Bilirubin, UA: NEGATIVE
Blood, UA: NEGATIVE
Glucose, UA: NEGATIVE
Ketones, UA: NEGATIVE
Leukocytes, UA: NEGATIVE
Nitrite, UA: NEGATIVE
Protein, UA: NEGATIVE
Spec Grav, UA: 1.01
Urobilinogen, UA: NEGATIVE
pH, UA: 7

## 2015-12-18 LAB — TSH: TSH: 2.899 u[IU]/mL (ref 0.350–4.500)

## 2015-12-18 MED ORDER — ONDANSETRON HCL 4 MG PO TABS: 4.0000 mg | ORAL_TABLET | Freq: Every day | ORAL | Status: DC | PRN

## 2015-12-18 MED ORDER — PROMETHAZINE HCL 25 MG PO TABS: 25.0000 mg | ORAL_TABLET | Freq: Four times a day (QID) | ORAL | Status: DC | PRN

## 2015-12-18 NOTE — Addendum Note (Signed)
Addended by: Carole Binning on: 12/18/2015 02:06 PM   Modules accepted: Orders

## 2015-12-18 NOTE — Addendum Note (Signed)
Addended by: Carole Binning on: 12/18/2015 02:04 PM   Modules accepted: Orders

## 2015-12-18 NOTE — Progress Notes (Signed)
Subjective:    Jessica [name] is being seen today for her first obstetrical visit.  This is not a planned pregnancy. She is at 16w4dgestation. Her obstetrical history is significant for obesity, pregnancy induced hypertension and Crohn's disease with bowel reastinosis. Relationship with FOB: significant other, living together. Patient undecided d/t medications for Chron's intend to breast feed. Pregnancy history fully reviewed.  Reports daily emesis.  Reports feeling baby move.    The information documented in the HPI was reviewed and verified.  Menstrual History: OB History    Gravida Para Term Preterm AB TAB SAB Ectopic Multiple Living   3 1 1  1  1   1       Menarche age: [age]years of age.    Patient's last menstrual period was 08/17/2015. Sure of LMP    Past Medical History  Diagnosis Date  . [place]   . Anemia   . Ileostomy in place ([facility]   . Osteoporosis     Past Surgical History  Procedure Laterality Date  . Small intestine surgery    . Incision and drainage Left     arm  . Dilation and evacuation N/A 10/31/2014    Procedure: DILATATION AND EVACUATION;  Surgeon: LLahoma Crocker MD;  [facility];  Service: Gynecology;  Laterality: N/A;  . Flexible sigmoidoscopy N/A 11/04/2014    Procedure: FLEXIBLE SIGMOIDOSCOPY;  Surgeon: DLafayette Dragon MD;  [facility];  Service: Endoscopy;  Laterality: N/A;  . Ileostomy closure N/A 12/18/2014    Procedure: LOOP ILEOSTOMY REVERSAL;  Surgeon: ALeighton Ruff MD;  Location: [facility];  Service: General;  Laterality: N/A;  . Esophagogastroduodenoscopy N/A 01/22/2015    Procedure: ESOPHAGOGASTRODUODENOSCOPY (EGD);  Surgeon: DLafayette Dragon MD;  [facility];  Service: Endoscopy;  Laterality: N/A;  . Colonoscopy N/A 01/22/2015    Procedure: COLONOSCOPY;  Surgeon: DLafayette Dragon MD;  [facility];  Service: Endoscopy;  Laterality: N/A;     (Not in a hospital admission) No Known Allergies  Social  History  Substance Use Topics  . Smoking status: Never Smoker   . Smokeless tobacco: Never Used  . Alcohol Use: No    Family History  Problem Relation Age of Onset  . Diabetes Father   . Stroke Neg Hx   . Diabetes Maternal Grandmother   . Heart disease Maternal Grandmother   . Hypertension Maternal Grandmother   . Kidney disease Maternal Grandmother   . Lung cancer Maternal Grandfather   . Hyperlipidemia Maternal Grandfather   . Colon cancer Neg Hx      Review of Systems Constitutional: negative for weight loss Gastrointestinal: + for nausea & vomiting Genitourinary:negative for genital lesions and vaginal discharge and dysuria Musculoskeletal:negative for back pain Behavioral/Psych: negative for abusive relationship, depression, illegal drug usage and tobacco use    Objective:    BP 128/88 mmHg  Pulse 99  Temp(Src) 98.8 F (37.1 C)  Wt 184 lb (83.462 kg)  LMP 08/17/2015 General Appearance:    Alert, cooperative, no distress, appears stated age  Head:    Normocephalic, without obvious abnormality, atraumatic  Eyes:    PERRL, conjunctiva/corneas clear, EOM's intact, fundi    benign, both eyes  Ears:    Normal TM's and external ear canals, both ears  Nose:   Nares normal, septum midline, mucosa normal, no drainage    or sinus tenderness  Throat:   Lips, mucosa, and tongue normal; teeth and gums normal  Neck:  Supple, symmetrical, trachea midline, no adenopathy;    thyroid:  no enlargement/tenderness/nodules; no carotid   bruit or JVD  Back:     Symmetric, no curvature, ROM normal, no CVA tenderness  Lungs:     Clear to auscultation bilaterally, respirations unlabored  Chest Wall:    No tenderness or deformity   Heart:    Regular rate and rhythm, S1 and S2 normal, no murmur, rub   or gallop  Breast Exam:    No tenderness, masses, or nipple abnormality  Abdomen:     Soft, non-tender, bowel sounds active all four quadrants,    no masses, no organomegaly  Genitalia:     Normal female without lesion, discharge or tenderness  Extremities:   Extremities normal, atraumatic, no cyanosis or edema  Pulses:   2+ and symmetric all extremities  Skin:   Skin color, texture, turgor normal, no rashes or lesions  Lymph nodes:   Cervical, supraclavicular, and axillary nodes normal  Neurologic:   CNII-XII intact, normal strength, sensation and reflexes    throughout      Cervix:  Long, thick, closed and posterior.                                    FHR:  150-160, FH; slightly less than U.      Lab Review Urine pregnancy test Labs reviewed yes Radiologic studies reviewed no Assessment:    Pregnancy at 61w4dweeks   N&V in early pregnancy  S=D  Crohn's Disease  hx of PIH previous pregnancy  Plan:      Prenatal vitamins.  Counseling provided regarding continued use of seat belts, cessation of alcohol consumption, smoking or use of illicit drugs; infection precautions i.e., influenza/TDAP immunizations, toxoplasmosis,CMV, parvovirus, listeria and varicella; workplace safety, exercise during pregnancy; routine dental care, safe medications, sexual activity, hot tubs, saunas, pools, travel, caffeine use, fish and methlymercury, potential toxins, hair treatments, varicose veins Weight gain recommendations per IOM guidelines reviewed: underweight/BMI< 18.5--> gain 28 - 40 lbs; normal weight/BMI 18.5 - 24.9--> gain 25 - 35 lbs; overweight/BMI 25 - 29.9--> gain 15 - 25 lbs; obese/BMI >30->gain  11 - 20 lbs Problem list reviewed and updated. FIRST/CF mutation testing/NIPT/QUAD SCREEN/fragile X/Ashkenazi Jewish population testing/Spinal muscular atrophy discussed: ordered. Role of ultrasound in pregnancy discussed; fetal survey: ordered. Amniocentesis discussed: not indicated. VBAC calculator score: VBAC consent form provided Meds ordered this encounter  Medications  . promethazine (PHENERGAN) 25 MG tablet    Sig: Take 1 tablet (25 mg total) by mouth every 6 (six) hours  as needed for nausea or vomiting.    Dispense:  30 tablet    Refill:  1  . ondansetron (ZOFRAN) 4 MG tablet    Sig: Take 1 tablet (4 mg total) by mouth daily as needed.    Dispense:  30 tablet    Refill:  1   Orders Placed This Encounter  Procedures  . Culture, OB Urine  . SureSwab, Vaginosis/Vaginitis Plus  . [place] OB COMP + 14 WK    Standing Status: Future     Number of Occurrences:      Standing Expiration Date: 02/15/2017    Order Specific Question:  Reason for Exam (SYMPTOM  OR DIAGNOSIS REQUIRED)    Answer:  fetal anatomy scan, dating    Order Specific Question:  Preferred imaging location?    Answer:  [facility]  . Obstetric panel  .  HIV antibody  . Hemoglobinopathy evaluation  . Varicella zoster antibody, IgG  . VITAMIN D 25 Hydroxy (Vit-D Deficiency, Fractures)  . TSH  . AFP, Quad Screen    Order Specific Question:  Repeat Sample    Answer:  No    Order Specific Question:  Maternal Race    Answer:  black    Order Specific Question:  EDD    Answer:  05/23/2016    Order Specific Question:  Pregnancy Donor Egg (Y/N)    Answer:  No    Order Specific Question:  Gest Age at U/S (Wk.Dy)    Answer:  none    Order Specific Question:  Number of Fetuses    Answer:  1    Order Specific Question:  Hx of OSB/NTD?    Answer:  No    Order Specific Question:  History of Down Syndrome?    Answer:  No    Order Specific Question:  Maternal IDDM (insulin-dependent diabetes mellitus)    Answer:  No    Order Specific Question:  Maternal Weight (lbs)    Answer:  189  . AMB Referral to Maternal Fetal Medicine (MFM)    Referral Priority:  Routine    Referral Type:  Consultation    Referral Reason:  Specialty Services Required    Number of Visits Requested:  1  . POCT urine pregnancy    Follow up in 4 weeks. 50% of 30 min visit spent on counseling and coordination of care.

## 2015-12-19 LAB — VITAMIN D 25 HYDROXY (VIT D DEFICIENCY, FRACTURES): Vit D, 25-Hydroxy: 9 ng/mL — ABNORMAL LOW (ref 30–100)

## 2015-12-19 LAB — HIV ANTIBODY (ROUTINE TESTING W REFLEX): HIV 1&2 Ab, 4th Generation: NONREACTIVE

## 2015-12-20 LAB — CULTURE, OB URINE: Colony Count: 75000

## 2015-12-20 NOTE — L&D Delivery Note (Signed)
Delivery Note At 11:27 AM a viable female was delivered via Vaginal, Spontaneous Delivery (Presentation: Right Occiput Anterior).  APGAR: 9, 9; weight 6 lb 10.4 oz (3015 g).   Placenta status: Intact, Spontaneous.  Cord: 3 vessels with the following complications: None.  Cord pH: none  Anesthesia: Epidural  Episiotomy: None Lacerations: None Suture Repair: none Est. Blood Loss (mL): 200  Mom to postpartum.  Baby to Couplet care / Skin to Skin.  Jessica [name] A 05/04/2016, 1:48 PM

## 2015-12-22 LAB — OBSTETRIC PANEL
Antibody Screen: NEGATIVE
Basophils Absolute: 0 10*3/uL (ref 0.0–0.1)
Basophils Relative: 0 % (ref 0–1)
Eosinophils Absolute: 0.1 10*3/uL (ref 0.0–0.7)
Eosinophils Relative: 1 % (ref 0–5)
HCT: 39.6 % (ref 36.0–46.0)
Hemoglobin: 13.2 g/dL (ref 12.0–15.0)
Hepatitis B Surface Ag: NEGATIVE
Lymphocytes Relative: 20 % (ref 12–46)
Lymphs Abs: 1.6 10*3/uL (ref 0.7–4.0)
MCH: 30 pg (ref 26.0–34.0)
MCHC: 33.3 g/dL (ref 30.0–36.0)
MCV: 90 fL (ref 78.0–100.0)
MPV: 10.8 fL (ref 8.6–12.4)
Monocytes Absolute: 0.8 10*3/uL (ref 0.1–1.0)
Monocytes Relative: 10 % (ref 3–12)
Neutro Abs: 5.4 10*3/uL (ref 1.7–7.7)
Neutrophils Relative %: 69 % (ref 43–77)
Platelets: 289 10*3/uL (ref 150–400)
RBC: 4.4 MIL/uL (ref 3.87–5.11)
RDW: 13.8 % (ref 11.5–15.5)
Rh Type: POSITIVE
Rubella: 1.15 Index — ABNORMAL HIGH (ref <0.90)
WBC: 7.8 10*3/uL (ref 4.0–10.5)

## 2015-12-22 LAB — AFP, QUAD SCREEN
AFP: 78.7 ng/mL
Age Alone: 1:1160 {titer}
Curr Gest Age: 17.4 wks.days
Down Syndrome Scr Risk Est: 1:33300 {titer}
HCG, Total: 25.8 IU/mL
INH: 125.1 pg/mL
Interpretation-AFP: NEGATIVE
MoM for AFP: 1.94
MoM for INH: 0.82
MoM for hCG: 0.95
Open Spina bifida: NEGATIVE
Osb Risk: 1:1750 {titer}
Tri 18 Scr Risk Est: NEGATIVE
Trisomy 18 (Edward) Syndrome Interp.: 1:11500 {titer}
uE3 Mom: 0.59
uE3 Value: 0.66 ng/mL

## 2015-12-22 LAB — PAP IG W/ RFLX HPV ASCU

## 2015-12-22 LAB — VARICELLA ZOSTER ANTIBODY, IGG: Varicella IgG: 187 Index — ABNORMAL HIGH (ref <135.00)

## 2015-12-23 ENCOUNTER — Other Ambulatory Visit (INDEPENDENT_AMBULATORY_CARE_PROVIDER_SITE_OTHER): Payer: Medicaid Other

## 2015-12-23 ENCOUNTER — Ambulatory Visit (INDEPENDENT_AMBULATORY_CARE_PROVIDER_SITE_OTHER): Payer: Medicaid Other | Admitting: Gastroenterology

## 2015-12-23 ENCOUNTER — Encounter: Payer: Self-pay | Admitting: Gastroenterology

## 2015-12-23 VITALS — BP 110/70 | HR 100 | Ht 66.75 in | Wt 185.4 lb

## 2015-12-23 DIAGNOSIS — K50919 Crohn's disease, unspecified, with unspecified complications: Secondary | ICD-10-CM

## 2015-12-23 DIAGNOSIS — Z79899 Other long term (current) drug therapy: Secondary | ICD-10-CM

## 2015-12-23 LAB — HEMOGLOBINOPATHY EVALUATION
Hemoglobin Other: 0 %
Hgb A2 Quant: 2.9 % (ref 2.2–3.2)
Hgb A: 97.1 % (ref 96.8–97.8)
Hgb F Quant: 0 % (ref 0.0–2.0)
Hgb S Quant: 0 %

## 2015-12-23 LAB — CBC WITH DIFFERENTIAL/PLATELET
Basophils Absolute: 0 10*3/uL (ref 0.0–0.1)
Basophils Relative: 0.2 % (ref 0.0–3.0)
Eosinophils Absolute: 0.1 10*3/uL (ref 0.0–0.7)
Eosinophils Relative: 1.1 % (ref 0.0–5.0)
HCT: 36.8 % (ref 36.0–46.0)
Hemoglobin: 12.4 g/dL (ref 12.0–15.0)
Lymphocytes Relative: 19.2 % (ref 12.0–46.0)
Lymphs Abs: 1.5 10*3/uL (ref 0.7–4.0)
MCHC: 33.8 g/dL (ref 30.0–36.0)
MCV: 91.4 fl (ref 78.0–100.0)
Monocytes Absolute: 0.7 10*3/uL (ref 0.1–1.0)
Monocytes Relative: 8.7 % (ref 3.0–12.0)
Neutro Abs: 5.5 10*3/uL (ref 1.4–7.7)
Neutrophils Relative %: 70.8 % (ref 43.0–77.0)
Platelets: 262 10*3/uL (ref 150.0–400.0)
RBC: 4.03 Mil/uL (ref 3.87–5.11)
RDW: 13.6 % (ref 11.5–15.5)
WBC: 7.7 10*3/uL (ref 4.0–10.5)

## 2015-12-23 LAB — COMPREHENSIVE METABOLIC PANEL
ALT: 9 U/L (ref 0–35)
AST: 12 U/L (ref 0–37)
Albumin: 3.6 g/dL (ref 3.5–5.2)
Alkaline Phosphatase: 59 U/L (ref 39–117)
BUN: 5 mg/dL — ABNORMAL LOW (ref 6–23)
CO2: 25 mEq/L (ref 19–32)
Calcium: 8.9 mg/dL (ref 8.4–10.5)
Chloride: 104 mEq/L (ref 96–112)
Creatinine, Ser: 0.48 mg/dL (ref 0.40–1.20)
GFR: 209.62 mL/min (ref >60.00)
Glucose, Bld: 81 mg/dL (ref 70–99)
Potassium: 3.8 mEq/L (ref 3.5–5.1)
Sodium: 135 mEq/L (ref 135–145)
Total Bilirubin: 0.3 mg/dL (ref 0.2–1.2)
Total Protein: 7.2 g/dL (ref 6.0–8.3)

## 2015-12-23 NOTE — Patient Instructions (Signed)
Your physician has requested that you go to the basement for lab work before leaving today.

## 2015-12-23 NOTE — Progress Notes (Signed)
HPI :  20 y/o female new to me, history of Crohns, here for reassessment. Former patient of Dr. Olevia Perches. History of Crohns diagnosed in 2012. Ileocolonic disease. She had a surgery with ileal resection with divertiing ileostomy at age 40 for emergency surgery, she thinks due to a perforation although details of this and indication for it are unclear at this time. She was hospitalized for diversion colitis in 2015. Ileostomy takedown was done in 2016. She had a post-operative colonoscopy in February 2016 showing mildly active disease, and was placed on Humira in March. She is doing well on it, taking it every 2 weeks. She has a bowel movement roughly 2 BMs per day. Loose stools at baseline. No blood in the stools. No significant abdominal discomfort. She reports historically she has been on prednisone and budesonide prior Humira, She has never been on Remicade, no prior thiopurines or methotrexate. She has no history of perianal disease. No history of IPAA.   She reports she has been pregnant for 4 1/2 months. She report she is doing "great" in her pregnancy. This is her second pregnancy, and states her first pregnancy her Crohns was very well controlled. No NSAIDs.   Colonoscopy 2/16 - patent anastomosis with some inflammatory changes, ileum with some ulcerations EGD 2/16 - normal  She is due for a flu shot  Past Medical History  Diagnosis Date  . Crohn's disease ([place])   . Anemia   . Osteoporosis      Past Surgical History  Procedure Laterality Date  . Small intestine surgery    . Incision and drainage Left     arm  . Dilation and evacuation N/A 10/31/2014    Procedure: DILATATION AND EVACUATION;  Surgeon: Lahoma Crocker, MD;  Location: [facility];  Service: Gynecology;  Laterality: N/A;  . Flexible sigmoidoscopy N/A 11/04/2014    Procedure: FLEXIBLE SIGMOIDOSCOPY;  Surgeon: [name], MD;  Location: [facility];  Service: Endoscopy;  Laterality: N/A;  . Ileostomy closure N/A  12/18/2014    Procedure: LOOP ILEOSTOMY REVERSAL;  Surgeon: [name], MD;  Location: [facility];  Service: General;  Laterality: N/A;  . Esophagogastroduodenoscopy N/A 01/22/2015    Procedure: ESOPHAGOGASTRODUODENOSCOPY (EGD);  Surgeon: [name], MD;  [facility];  Service: Endoscopy;  Laterality: N/A;  . Colonoscopy N/A 01/22/2015    Procedure: COLONOSCOPY;  Surgeon: [name], MD;  [facility];  Service: Endoscopy;  Laterality: N/A;   Family History  Problem Relation Age of Onset  . Diabetes Father   . Stroke Neg Hx   . Diabetes Maternal Grandmother   . Heart disease Maternal Grandmother   . Hypertension Maternal Grandmother   . Kidney disease Maternal Grandmother   . Lung cancer Maternal Grandfather   . Hyperlipidemia Maternal Grandfather   . Colon cancer Neg Hx    Social History  Substance Use Topics  . Smoking status: Never Smoker   . Smokeless tobacco: Never Used  . Alcohol Use: No   Current Outpatient Prescriptions  Medication Sig Dispense Refill  . Doxylamine-Pyridoxine (DICLEGIS) 10-10 MG TBEC 1 tab in AM, 1 tab mid afternoon 2 tabs at bedtime. Max dose 4 tabs daily. 100 tablet 4  . HUMIRA PEN 40 MG/0.8ML PNKT Inject 40 mg into the skin every 21 ( twenty-one) days.    . ondansetron (ZOFRAN) 4 MG tablet Take 1 tablet (4 mg total) by mouth daily as needed. 30 tablet 1  . Prenat-Fe Poly-Methfol-FA-DHA (VITAFOL ULTRA) 29-0.6-0.4-200 MG CAPS  Take 1 capsule by mouth daily. 30 capsule 11  . promethazine (PHENERGAN) 25 MG tablet Take 1 tablet (25 mg total) by mouth every 6 (six) hours as needed for nausea or vomiting. 30 tablet 1   No current facility-administered medications for this visit.   No Known Allergies   Review of Systems: All systems reviewed and negative except where noted in HPI.    No results found.  No recent labs in [place]. Remote CBC in May with normal Hgb.  Physical Exam: BP 110/70 mmHg  Pulse 100  Ht 5' 6.75" (1.695 m)  Wt  185 lb 6 oz (84.086 kg)  BMI 29.27 kg/m2  LMP 08/17/2015 Constitutional: Pleasant,well-developed, female in no acute distress. HEENT: Normocephalic and atraumatic. Conjunctivae are normal. No scleral icterus. Neck supple.  Cardiovascular: Normal rate, regular rhythm.  Pulmonary/chest: Effort normal and breath sounds normal. No wheezing, rales or rhonchi. Abdominal: Soft, nondistended, nontender. Gravid uterus. Bowel sounds active throughout. There are no masses palpable. No hepatomegaly. Extremities: no edema Lymphadenopathy: No cervical adenopathy noted. Neurological: Alert and oriented to person place and time. Skin: Skin is warm and dry. No rashes noted. Psychiatric: Normal mood and affect. Behavior is normal.   ASSESSMENT AND PLAN: 20 y/o female with a history of ileocolonic Crohns managed historically with steroids, over time her disease was complicated by what sounds like a perforation or abscess leading to surgery and temporary ileostomy for a period of time. She was hospitalized for diversion colitis last year and eventually underwent takedown of the ostomy this past year, early 2016. A post-op colonoscopy showed mildly active Crohns. She was started on Humira, her first anti-TNF, after the colonoscopy and general doing pretty well since that time. She is compliant with Humira and tolerating it well. She is now 4.5 months pregnant.   We discussed management of Crohns in general and Crohns in pregnancy. I recommend continuing her medications to keep her Crohns in remission during pregnancy, as stopping her medications and flare of Crohns puts her at risk for premature birth and low birth weight. As such, per the 2016 [place] Consensus statement on the management of IBD in pregnancy, I recommend the following: - continue Humira as scheduled, hold last dose around 34-36 weeks, and resume postpartum - basic labs today to ensure stable - given she has no perianal disease or IPAA, she does  not warrant c-section from my perspective but she should discuss with her OB.  - post-partum, okay to breast feed and is safe for the baby - her baby should not have any live virus vaccines while less than 69 months of age, she needs to address this with the pediatrician postpartum - patient is due for flu shot today and recommended it given her Humira use and pregnancy status, however she declined after discussion of this, which she would not elaborate why - No NSAIDs now or in general in light of her IBD  I asked her to follow up in a few months for reassessment. If she has any concern about flare of her symptoms during pregnancy she should contact me. I hope and anticipate she will do well as she felt very well during her last pregnancy and thus far has done quite well.   [place] Cellar, MD [facility] Pager [phone]

## 2015-12-24 LAB — SURESWAB, VAGINOSIS/VAGINITIS PLUS
Atopobium vaginae: NOT DETECTED Log (cells/mL)
C. albicans, DNA: NOT DETECTED
C. glabrata, DNA: NOT DETECTED
C. parapsilosis, DNA: NOT DETECTED
C. trachomatis RNA, TMA: NOT DETECTED
C. tropicalis, DNA: NOT DETECTED
Gardnerella vaginalis: <4.7 Log (cells/mL)
LACTOBACILLUS SPECIES: NOT DETECTED Log (cells/mL)
MEGASPHAERA SPECIES: NOT DETECTED Log (cells/mL)
N. gonorrhoeae RNA, TMA: NOT DETECTED
T. vaginalis RNA, QL TMA: NOT DETECTED

## 2016-01-01 ENCOUNTER — Other Ambulatory Visit (HOSPITAL_COMMUNITY): Payer: Self-pay | Admitting: *Deleted

## 2016-01-01 ENCOUNTER — Other Ambulatory Visit: Payer: Self-pay | Admitting: Certified Nurse Midwife

## 2016-01-01 ENCOUNTER — Ambulatory Visit (HOSPITAL_COMMUNITY)
Admission: RE | Admit: 2016-01-01 | Discharge: 2016-01-01 | Disposition: A | Discharge status: Home / Self Care | Payer: Medicaid Other | Source: Ambulatory Visit | Attending: Certified Nurse Midwife | Admitting: Certified Nurse Midwife

## 2016-01-01 ENCOUNTER — Encounter (HOSPITAL_COMMUNITY): Payer: Self-pay

## 2016-01-01 DIAGNOSIS — Z3A19 19 weeks gestation of pregnancy: Secondary | ICD-10-CM | POA: Diagnosis not present

## 2016-01-01 DIAGNOSIS — K5090 Crohn's disease, unspecified, without complications: Secondary | ICD-10-CM

## 2016-01-01 DIAGNOSIS — O0992 Supervision of high risk pregnancy, unspecified, second trimester: Secondary | ICD-10-CM | POA: Diagnosis present

## 2016-01-01 DIAGNOSIS — O09892 Supervision of other high risk pregnancies, second trimester: Secondary | ICD-10-CM

## 2016-01-01 DIAGNOSIS — Z3A18 18 weeks gestation of pregnancy: Secondary | ICD-10-CM

## 2016-01-01 DIAGNOSIS — O99612 Diseases of the digestive system complicating pregnancy, second trimester: Principal | ICD-10-CM

## 2016-01-01 NOTE — Progress Notes (Signed)
MFM consult staff note:  I spoke to her that the course of Crohn's disease during pregnancy appears to be determined in part by the activity of the disease at conception. Approximately one-third of women will have a relapse during pregnancy that is most common during the postpartum period, specifically around 4-6 weeks after delivery. We explained to her that the increase in flares postpartum is due to the release of inflammatory milieu in association with delivery as well as hormonal changes and removal of the suppression of the immune system during pregnancy. In her case, her Crohn's disease appears to have remained stable following her first childbirth complicated by apparent gestational hypertension but suffered a flare after a subsequent miscarriage.  Complications occurring during pregnancy in a woman whose disease is effectively managed is similar to those of nonpregnant patients with Crohn's disease; however, the flare of the disease activity may be associated with premature labor or spontaneous abortion. The risk of congenital anomalies or stillbirth does not appear to be increased. Severe relapses during pregnancy are, however, associated with increased risk of preterm birth and low birth weight babies.   Because women with Crohn's disease have increased risk for low birth weight babies as well as possible risk for intrauterine growth restriction, current conventions include an outline of monitoring as follows:  1. Interval growth ultrasound assessments beginning at 22-24 weeks to be done every 4 weeks throughout pregnancy, in addition to the usual anatomic survey at 18 weeks of gestational age. This is in addition to the routine prenatal screening.  2. Should abnormalities of growth or amniotic fluid occur such as definitively small for gestational age fetus or oligohydramnios, then antenatal testing should begin at around 33 weeks. Such testing would consist of twice weekly nonstress tests and  weekly amniotic fluid indices until delivery. I would reserve routine scheduling with normal growth to the evaluating perinatologist in the late trimester (ie, active Crohn's will prompt antenatal testing but stable Crohn's and normal growth can be followed by monthly ultrasounds in conjunction with routine prenatal care weekly).  3. In regard to medication safety during pregnancy, it is notable that numerous medications utilized for the treatment of Crohn's disease are considered to be safe. Humira that she is on has an excellent safety profile. Several years of experience with both Imuran, which is azathioprine, and Asacol, which is mesalamine, indicate that these medications are also safe in pregnancy as well as in breastfeeding.   Because of their importance on treatment of a variety of inflammatory conditions, systemic corticosteroids such as prednisone have been used fairly extensively during pregnancy. The risk of steroids in the setting of pregnancy appears to be small and is generally agreed that it should not be withheld during pregnancy when they are clinically indicated. In other words, if a flare occurs during pregnancy, prednisone is both safe and recommended. If prednisone is used in the first trimester, there is an association with slightly increased risk for cleft lip and palate. This would lead me to recommend use of prednisone after the first trimester and if a flare is encountered, I would recommend addition of either mesalamine or azathioprine accordingly per GI medicine's preference.  Given that the patient has inflammatory bowel disease (Crohn's disease) and anemia, careful attention to nutrition is warranted. I recommend a minimum of 25-35 pound weight gain in pregnancy for her and would like for her to have a nutritionist consultation in concert with her close, regular follow ups with GI Medicine. I would recommend monitoring  hemoglobin every 4-6 weeks. If oral iron supplementation is  inadequate to maintain hemoglobin at or above 11m/dl (ie, important for maternal well-being and in preparation for the eventual cesarean section, then intravenous iron administration should be considered. If the patient nonetheless, ends up with severe anemia (eg, <6142mdl or 6-42m19ml with symptoms), then discussion surrounding RBC transfusion would be appropriate.  4. While I would normally, start low dose aspirin for history of gestational hypertension, there are studies demonstrating increased risk for Crohn's disease with regular aspirin use.  My extrapolation from such data is that aspirin may minimally reduce preeclampsia risk but increase risk of a Crohn's flare; ie, risk from aspirin being maternal deterioration and preterm delivery greatly outweighing potential benefit.  I did not discuss use of aspirin to prevent preeclampsia with her.   All of your patient's questions were answered.  SUMMARY OF RECOMMENDATIONS: 1. Interval growth ultrasounds every month of this pregnancy  2. Initiation of antenatal testing only if there are growth abnormalities, fluid abnormalities, active flares from Crohn's in pregnancy, or usual indications (eg, GHTN). 3. If prednisone or other corticosteroids are needed long-term during the course of this pregnancy (duration >3wks within 1 month of delivery), I would recommend stress dose steroids for the first 24 hours postpartum. There are a number of such regimens commonly used. One would be hydrocortisone 100 mg IV every 8 hours for 3 doses beginning shortly after delivery.  4. I would recommend administration of a Tdap vaccination sometime between 26-36 weeks of this pregnancy. Likewise, I recommend this vaccination for her husband and any other family members who may be in close contact with the baby after delivery. 5. A nutritionist specializing in GI conditions would be appropriate as well to assist in appropriate weight gain 25-35 pounds.  Please consider  referral.   DISPOSITION: The patient will continue to follow up with you for prenatal care. Our office will continue to remain involved for ultrasound exams and any other questions that you might have. If a complete transfer of care is desired, we would be more than happy to manage her pregnancy.  TIME OF CONSULTATION: More than 30 minutes were spent in this evaluation and discussion. More than 50% of this time was spent in direct face-to-face counseling. Thank you for allowing us [place] participate in the care of your patient.  Thank you, JefDelman CheadlenHarl FavorefDelman CheadleD, MS, FACOG Assistant Professor Section of [place]

## 2016-01-01 NOTE — Consult Note (Signed)
MFM consult staff note:  I spoke to her that the course of Crohn's disease during pregnancy appears to be determined in part by the activity of the disease at conception. Approximately one-third of women will have a relapse during pregnancy that is most common during the postpartum period, specifically around 4-6 weeks after delivery. We explained to her that the increase in flares postpartum is due to the release of inflammatory milieu in association with delivery as well as hormonal changes and removal of the suppression of the immune system during pregnancy. In her case, her Crohn's disease appears to have remained stable following her first childbirth complicated by apparent gestational hypertension but suffered a flare after a subsequent miscarriage.  Complications occurring during pregnancy in a woman whose disease is effectively managed is similar to those of nonpregnant patients with Crohn's disease; however, the flare of the disease activity may be associated with premature labor or spontaneous abortion. The risk of congenital anomalies or stillbirth does not appear to be increased. Severe relapses during pregnancy are, however, associated with increased risk of preterm birth and low birth weight babies.   Because women with Crohn's disease have increased risk for low birth weight babies as well as possible risk for intrauterine growth restriction, current conventions include an outline of monitoring as follows:  1. Interval growth ultrasound assessments beginning at 22-24 weeks to be done every 4 weeks throughout pregnancy, in addition to the usual anatomic survey at 18 weeks of gestational age. This is in addition to the routine prenatal screening.  2. Should abnormalities of growth or amniotic fluid occur such as definitively small for gestational age fetus or oligohydramnios, then antenatal testing should begin at around 14 weeks. Such testing would consist of twice weekly nonstress tests and  weekly amniotic fluid indices until delivery. I would reserve routine scheduling with normal growth to the evaluating perinatologist in the late trimester (ie, active Crohn's will prompt antenatal testing but stable Crohn's and normal growth can be followed by monthly ultrasounds in conjunction with routine prenatal care weekly).  3. In regard to medication safety during pregnancy, it is notable that numerous medications utilized for the treatment of Crohn's disease are considered to be safe. Humira that she is on has an excellent safety profile. Several years of experience with both Imuran, which is azathioprine, and Asacol, which is mesalamine, indicate that these medications are also safe in pregnancy as well as in breastfeeding.   Because of their importance on treatment of a variety of inflammatory conditions, systemic corticosteroids such as prednisone have been used fairly extensively during pregnancy. The risk of steroids in the setting of pregnancy appears to be small and is generally agreed that it should not be withheld during pregnancy when they are clinically indicated. In other words, if a flare occurs during pregnancy, prednisone is both safe and recommended. If prednisone is used in the first trimester, there is an association with slightly increased risk for cleft lip and palate. This would lead me to recommend use of prednisone after the first trimester and if a flare is encountered, I would recommend addition of either mesalamine or azathioprine accordingly per GI medicine's preference.  Given that the patient has inflammatory bowel disease (Crohn's disease) and anemia, careful attention to nutrition is warranted. I recommend a minimum of 25-35 pound weight gain in pregnancy for her and would like for her to have a nutritionist consultation in concert with her close, regular follow ups with GI Medicine. I would recommend monitoring  hemoglobin every 4-6 weeks. If oral iron supplementation is  inadequate to maintain hemoglobin at or above 105m/dl (ie, important for maternal well-being and in preparation for the eventual cesarean section, then intravenous iron administration should be considered. If the patient nonetheless, ends up with severe anemia (eg, <61029mdl or 6-29m11ml with symptoms), then discussion surrounding RBC transfusion would be appropriate.  4. While I would normally, start low dose aspirin for history of gestational hypertension, there are studies demonstrating increased risk for Crohn's disease with regular aspirin use.  My extrapolation from such data is that aspirin may minimally reduce preeclampsia risk but increase risk of a Crohn's flare; ie, risk from aspirin being maternal deterioration and preterm delivery greatly outweighing potential benefit.  I did not discuss use of aspirin to prevent preeclampsia with her.   All of your patient's questions were answered.  SUMMARY OF RECOMMENDATIONS: 1. Interval growth ultrasounds every month of this pregnancy  2. Initiation of antenatal testing only if there are growth abnormalities, fluid abnormalities, active flares from Crohn's in pregnancy, or usual indications (eg, GHTN). 3. If prednisone or other corticosteroids are needed long-term during the course of this pregnancy (duration >3wks within 1 month of delivery), I would recommend stress dose steroids for the first 24 hours postpartum. There are a number of such regimens commonly used. One would be hydrocortisone 100 mg IV every 8 hours for 3 doses beginning shortly after delivery.  4. I would recommend administration of a Tdap vaccination sometime between 26-36 weeks of this pregnancy. Likewise, I recommend this vaccination for her husband and any other family members who may be in close contact with the baby after delivery. 5. A nutritionist specializing in GI conditions would be appropriate as well to assist in appropriate weight gain 25-35 pounds.  Please consider  referral.   DISPOSITION: The patient will continue to follow up with you for prenatal care. Our office will continue to remain involved for ultrasound exams and any other questions that you might have. If a complete transfer of care is desired, we would be more than happy to manage her pregnancy.  TIME OF CONSULTATION: More than 30 minutes were spent in this evaluation and discussion. More than 50% of this time was spent in direct face-to-face counseling. Thank you for allowing us [place] participate in the care of your patient.  Thank you, JefDelman CheadlenHarl FavorefDelman CheadleD, MS, FACOG Assistant Professor Section [place]

## 2016-01-05 ENCOUNTER — Other Ambulatory Visit: Payer: Self-pay | Admitting: Certified Nurse Midwife

## 2016-01-05 DIAGNOSIS — O0992 Supervision of high risk pregnancy, unspecified, second trimester: Secondary | ICD-10-CM

## 2016-01-06 ENCOUNTER — Telehealth: Payer: Self-pay | Admitting: *Deleted

## 2016-01-06 ENCOUNTER — Other Ambulatory Visit: Payer: Self-pay | Admitting: Certified Nurse Midwife

## 2016-01-06 NOTE — Telephone Encounter (Signed)
Patient has sinus symptoms and wants to know what to take. 11:50 Call to patient- LM on [facility] OTC list given for cold and sinus allergy treatment. Advised to call back if she developes fever or sinus infection symptoms.

## 2016-01-11 ENCOUNTER — Telehealth: Payer: Self-pay | Admitting: Gastroenterology

## 2016-01-11 NOTE — Telephone Encounter (Signed)
Jessica [name] could you clarify what type of infection she has in her eye, how long she will need to be on therapy, and when her last dose of Humira was? Thanks

## 2016-01-11 NOTE — Telephone Encounter (Signed)
Patient went to her eye doctor and she has an eye infection. He gave her eye drops for it. She is on Humira and wanted to let Dr. Havery Moros know.

## 2016-01-12 NOTE — Telephone Encounter (Signed)
Spoke with patient and she will have to call her eye doctor to find out what type of infection she has. She states she is on antibiotic eye drops for 8 days. She took Humira last week.

## 2016-01-13 ENCOUNTER — Telehealth: Payer: Self-pay | Admitting: Gastroenterology

## 2016-01-13 MED ORDER — HUMIRA PEN 40 MG/0.8ML ~~LOC~~ PNKT
40.0000 mg | PEN_INJECTOR | SUBCUTANEOUS | Status: DC
Start: 2016-01-13 — End: 2016-06-07

## 2016-01-13 NOTE — Telephone Encounter (Signed)
rx sent to [facility]. Patient aware.

## 2016-01-13 NOTE — Telephone Encounter (Signed)
Patient given recommendation.

## 2016-01-13 NOTE — Telephone Encounter (Signed)
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 wanted to give this info.    By Marvel Plan

## 2016-01-13 NOTE — Telephone Encounter (Signed)
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 Thanks

## 2016-01-15 ENCOUNTER — Ambulatory Visit (INDEPENDENT_AMBULATORY_CARE_PROVIDER_SITE_OTHER): Payer: Medicaid Other | Admitting: Certified Nurse Midwife

## 2016-01-15 VITALS — BP 125/85 | HR 91 | Temp 98.2°F | Wt 189.0 lb

## 2016-01-15 DIAGNOSIS — Z3482 Encounter for supervision of other normal pregnancy, second trimester: Secondary | ICD-10-CM

## 2016-01-15 LAB — POCT URINALYSIS DIPSTICK
Bilirubin, UA: NEGATIVE
Blood, UA: NEGATIVE
Glucose, UA: NEGATIVE
Ketones, UA: NEGATIVE
Nitrite, UA: NEGATIVE
Protein, UA: NEGATIVE
Spec Grav, UA: 1.01
Urobilinogen, UA: NEGATIVE
pH, UA: 6.5

## 2016-01-17 NOTE — Progress Notes (Signed)
Subjective:    Jessica [name] is a 20 y.o. female being seen today for her obstetrical visit. She is at 73w6dgestation. Patient reports: no complaints . Fetal movement: normal.  Problem List Items Addressed This Visit    None    Visit Diagnoses    Encounter for supervision of other normal pregnancy in second trimester    -  Primary    Relevant Orders    POCT urinalysis dipstick (Completed)      Patient Active Problem List   Diagnosis Date Noted  . Supervision of high-risk pregnancy 12/18/2015  . Nausea and vomiting 01/22/2015  . Crohn's ileitis ([facility] 01/22/2015  . Diarrhea 01/22/2015  . Malnutrition of moderate degree ([place] 01/05/2015  . Nausea 01/03/2015  . 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 Absolute anemia   . CC (Crohn's colitis) (HD[place] 11/02/2014  . Rectal bleeding 11/02/2014  . Crohn disease ([place] 11/01/2014  . Missed abortion 10/31/2014  . Gestational hypertension w/o significant proteinuria in 3rd trimester 07/14/2013  . Supervision of normal first pregnancy 05/03/2013  . Crohn's disease (HH[place] 05/03/2013  . Ileostomy in place ([facility] 05/03/2013   Objective:    BP 125/85 mmHg  Pulse 91  Temp(Src) 98.2 F (36.8 C)  Wt 189 lb (85.73 kg)  LMP 08/17/2015 (Exact Date) FHT: 150 BPM  Uterine Size: size equals dates     Assessment:    Pregnancy @ 270w6d  H/O crohn's  Plan:    OBGCT: discussed. Signs and symptoms of preterm labor: discussed.  Labs, problem list reviewed and updated 2 hr GTT planned Follow up in 4 weeks.

## 2016-01-19 ENCOUNTER — Telehealth: Payer: Self-pay | Admitting: *Deleted

## 2016-01-19 ENCOUNTER — Other Ambulatory Visit: Payer: Self-pay | Admitting: *Deleted

## 2016-01-19 DIAGNOSIS — K50119 Crohn's disease of large intestine with unspecified complications: Secondary | ICD-10-CM

## 2016-01-19 NOTE — Telephone Encounter (Signed)
Patient is due for TB testing in February. Last year her quantiferon was indeterminate. He chest xray was normal. She is pregnant at this time. Please, advise on whether she should have lab testing now.

## 2016-01-19 NOTE — Telephone Encounter (Signed)
Lab in EPIC. Left a message for patient to call back.

## 2016-01-19 NOTE — Telephone Encounter (Signed)
I think it's okay to test another quantiferon gold the next time she has a blood draw. Okay to test at this time. Thanks

## 2016-01-20 ENCOUNTER — Other Ambulatory Visit: Payer: Self-pay | Admitting: Certified Nurse Midwife

## 2016-01-20 ENCOUNTER — Ambulatory Visit: Payer: Medicaid Other | Admitting: *Deleted

## 2016-01-20 NOTE — Telephone Encounter (Signed)
Patient will come for lab. 

## 2016-01-29 ENCOUNTER — Ambulatory Visit (HOSPITAL_COMMUNITY)
Admission: RE | Admit: 2016-01-29 | Discharge: 2016-01-29 | Disposition: A | Discharge status: Home / Self Care | Payer: Medicaid Other | Source: Ambulatory Visit | Attending: Certified Nurse Midwife | Admitting: Certified Nurse Midwife

## 2016-01-29 ENCOUNTER — Other Ambulatory Visit (HOSPITAL_COMMUNITY): Payer: Self-pay | Admitting: Obstetrics and Gynecology

## 2016-01-29 ENCOUNTER — Ambulatory Visit: Payer: Medicaid Other | Admitting: Dietician

## 2016-01-29 DIAGNOSIS — O99612 Diseases of the digestive system complicating pregnancy, second trimester: Secondary | ICD-10-CM | POA: Insufficient documentation

## 2016-01-29 DIAGNOSIS — O09292 Supervision of pregnancy with other poor reproductive or obstetric history, second trimester: Secondary | ICD-10-CM

## 2016-01-29 DIAGNOSIS — Z0489 Encounter for examination and observation for other specified reasons: Secondary | ICD-10-CM

## 2016-01-29 DIAGNOSIS — IMO0002 Reserved for concepts with insufficient information to code with codable children: Secondary | ICD-10-CM

## 2016-01-29 DIAGNOSIS — Z3A23 23 weeks gestation of pregnancy: Secondary | ICD-10-CM | POA: Insufficient documentation

## 2016-01-29 DIAGNOSIS — K5090 Crohn's disease, unspecified, without complications: Secondary | ICD-10-CM | POA: Diagnosis not present

## 2016-02-12 ENCOUNTER — Ambulatory Visit (INDEPENDENT_AMBULATORY_CARE_PROVIDER_SITE_OTHER): Payer: Medicaid Other | Admitting: Certified Nurse Midwife

## 2016-02-12 VITALS — BP 112/78 | HR 91 | Temp 98.2°F | Wt 191.0 lb

## 2016-02-12 DIAGNOSIS — Z3482 Encounter for supervision of other normal pregnancy, second trimester: Secondary | ICD-10-CM

## 2016-02-12 LAB — POCT URINALYSIS DIPSTICK
Bilirubin, UA: NEGATIVE
Blood, UA: NEGATIVE
Glucose, UA: NEGATIVE
Ketones, UA: NEGATIVE
Leukocytes, UA: NEGATIVE
Nitrite, UA: NEGATIVE
Protein, UA: NEGATIVE
Spec Grav, UA: 1.01
Urobilinogen, UA: NEGATIVE
pH, UA: 6

## 2016-02-12 NOTE — Progress Notes (Signed)
Subjective:    Jessica [name] is a 20 y.o. female being seen today for her obstetrical visit. She is at 57w4dgestation. Patient reports: no complaints . Fetal movement: normal.  Problem List Items Addressed This Visit    None    Visit Diagnoses    Encounter for supervision of other normal pregnancy in second trimester    -  Primary    Relevant Orders    POCT urinalysis dipstick (Completed)      Patient Active Problem List   Diagnosis Date Noted  . Supervision of high-risk pregnancy 12/18/2015  . Nausea and vomiting 01/22/2015  . Crohn's ileitis (HH[place] 01/22/2015  . Diarrhea 01/22/2015  . Malnutrition of moderate degree (HV[place] 01/05/2015  . Nausea 01/03/2015  . 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 Absolute anemia   . CC (Crohn's colitis) (HRew 11/02/2014  . Rectal bleeding 11/02/2014  . Crohn disease (HEd[place] 11/01/2014  . Missed abortion 10/31/2014  . Gestational hypertension w/o significant proteinuria in 3rd trimester 07/14/2013  . Supervision of normal first pregnancy 05/03/2013  . Crohn's disease (HM[place] 05/03/2013  . 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 Crohn's stable. Plan:    OBGCT: discussed and ordered for next visit. Signs and symptoms of preterm labor: discussed.  Labs, problem list reviewed and updated 2 hr GTT planned Follow up in 3 weeks.

## 2016-02-26 ENCOUNTER — Ambulatory Visit (HOSPITAL_COMMUNITY)
Admission: RE | Admit: 2016-02-26 | Discharge: 2016-02-26 | Disposition: A | Discharge status: Home / Self Care | Payer: Medicaid Other | Source: Ambulatory Visit | Attending: Obstetrics and Gynecology | Admitting: Obstetrics and Gynecology

## 2016-02-26 ENCOUNTER — Encounter (HOSPITAL_COMMUNITY): Payer: Self-pay

## 2016-02-26 ENCOUNTER — Other Ambulatory Visit (HOSPITAL_COMMUNITY): Payer: Self-pay | Admitting: Obstetrics and Gynecology

## 2016-02-26 DIAGNOSIS — O99612 Diseases of the digestive system complicating pregnancy, second trimester: Secondary | ICD-10-CM

## 2016-02-26 DIAGNOSIS — Z3A27 27 weeks gestation of pregnancy: Secondary | ICD-10-CM | POA: Diagnosis not present

## 2016-02-26 DIAGNOSIS — K5090 Crohn's disease, unspecified, without complications: Secondary | ICD-10-CM

## 2016-02-26 DIAGNOSIS — O09292 Supervision of pregnancy with other poor reproductive or obstetric history, second trimester: Secondary | ICD-10-CM | POA: Diagnosis present

## 2016-03-01 ENCOUNTER — Other Ambulatory Visit: Payer: Self-pay | Admitting: Certified Nurse Midwife

## 2016-03-04 ENCOUNTER — Other Ambulatory Visit: Payer: Medicaid Other

## 2016-03-04 ENCOUNTER — Ambulatory Visit (INDEPENDENT_AMBULATORY_CARE_PROVIDER_SITE_OTHER): Payer: Medicaid Other | Admitting: Certified Nurse Midwife

## 2016-03-04 VITALS — BP 121/86 | HR 92 | Wt 196.0 lb

## 2016-03-04 DIAGNOSIS — Z3483 Encounter for supervision of other normal pregnancy, third trimester: Secondary | ICD-10-CM

## 2016-03-04 LAB — POCT URINALYSIS DIPSTICK
Bilirubin, UA: NEGATIVE
Blood, UA: NEGATIVE
Glucose, UA: NEGATIVE
Ketones, UA: NEGATIVE
Nitrite, UA: NEGATIVE
Spec Grav, UA: 1.02
Urobilinogen, UA: NEGATIVE
pH, UA: 7

## 2016-03-04 NOTE — Progress Notes (Signed)
Subjective:    Jessica [name] is a 20 y.o. female being seen today for her obstetrical visit. She is at 41w4dgestation. Patient reports backache, no bleeding, no contractions, no cramping, no leaking and sharp lower abdominal shooting pains with movement bilaterally. Fetal movement: normal.  Problem List Items Addressed This Visit    None    Visit Diagnoses    Encounter for supervision of other normal pregnancy in third trimester    -  Primary    Relevant Orders    Glucose Tolerance, 2 Hours w/1 Hour    CBC    HIV antibody    RPR    POCT urinalysis dipstick (Completed)      Patient Active Problem List   Diagnosis Date Noted  . Supervision of high-risk pregnancy 12/18/2015  . Nausea and vomiting 01/22/2015  . Crohn's ileitis ([facility] 01/22/2015  . Diarrhea 01/22/2015  . Malnutrition of moderate degree (HW[place] 01/05/2015  . Nausea 01/03/2015  . 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 Absolute anemia   . CC (Crohn's colitis) (HChe[place] 11/02/2014  . Rectal bleeding 11/02/2014  . Crohn disease ([place] 11/01/2014  . Missed abortion 10/31/2014  . Gestational hypertension w/o significant proteinuria in 3rd trimester 07/14/2013  . Supervision of normal first pregnancy 05/03/2013  . Crohn's disease (HSt[place] 05/03/2013  . 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 GBS planning TDAP offered  Rhogam given for RH negative Pediatrician: discussed. Infant feeding: plans to breastfeed. Maternity leave: discussed. Cigarette smoking: never smoked. Orders Placed This Encounter  Procedures  . Glucose Tolerance, 2 Hours w/1 Hour  . CBC  . HIV antibody   . RPR  . POCT urinalysis dipstick   No orders of the defined types were placed in this encounter.   Follow up in 2 Weeks.

## 2016-03-04 NOTE — Progress Notes (Signed)
Pt is having some pressure and mild cramps.

## 2016-03-05 LAB — CBC
Hematocrit: 33.7 % — ABNORMAL LOW (ref 34.0–46.6)
Hemoglobin: 11.2 g/dL (ref 11.1–15.9)
MCH: 30 pg (ref 26.6–33.0)
MCHC: 33.2 g/dL (ref 31.5–35.7)
MCV: 90 fL (ref 79–97)
Platelets: 256 10*3/uL (ref 150–379)
RBC: 3.73 x10E6/uL — ABNORMAL LOW (ref 3.77–5.28)
RDW: 13.4 % (ref 12.3–15.4)
WBC: 7.7 10*3/uL (ref 3.4–10.8)

## 2016-03-05 LAB — GLUCOSE TOLERANCE, 2 HOURS W/ 1HR
Glucose, 1 hour: 114 mg/dL (ref 65–179)
Glucose, 2 hour: 90 mg/dL (ref 65–152)
Glucose, Fasting: 71 mg/dL (ref 65–91)

## 2016-03-05 LAB — RPR: RPR Ser Ql: NONREACTIVE

## 2016-03-05 LAB — HIV ANTIBODY (ROUTINE TESTING W REFLEX): HIV Screen 4th Generation wRfx: NONREACTIVE

## 2016-03-14 ENCOUNTER — Other Ambulatory Visit: Payer: Self-pay | Admitting: Certified Nurse Midwife

## 2016-03-18 ENCOUNTER — Ambulatory Visit (INDEPENDENT_AMBULATORY_CARE_PROVIDER_SITE_OTHER): Payer: Medicaid Other | Admitting: Certified Nurse Midwife

## 2016-03-18 VITALS — BP 121/85 | HR 97 | Temp 98.6°F | Wt 195.0 lb

## 2016-03-18 DIAGNOSIS — Z3493 Encounter for supervision of normal pregnancy, unspecified, third trimester: Secondary | ICD-10-CM

## 2016-03-18 LAB — POCT URINALYSIS DIPSTICK
Bilirubin, UA: NEGATIVE
Blood, UA: NEGATIVE
Glucose, UA: NEGATIVE
Ketones, UA: NEGATIVE
Leukocytes, UA: NEGATIVE
Nitrite, UA: NEGATIVE
Protein, UA: NEGATIVE
Spec Grav, UA: <=1.005
Urobilinogen, UA: NEGATIVE
pH, UA: 6.5

## 2016-03-18 NOTE — Progress Notes (Signed)
Patient complains of R sided lower pain that started this am - better when she stands. She has not started wearing her support belt yet- she plans to pick it up today.

## 2016-03-18 NOTE — Progress Notes (Signed)
Subjective:    Jessica [name] is a 20 y.o. female being seen today for her obstetrical visit. She is at 65w4dgestation. Patient reports backache, no bleeding, no contractions, no cramping and no leaking. Round ligament pain, has not started wearing the maternity support belt, discussed other comfort measures.  Patient states that she will pick it up today.  Fetal movement: normal.  Problem List Items Addressed This Visit    None    Visit Diagnoses    Prenatal care, third trimester    -  Primary    Relevant Orders    POCT urinalysis dipstick (Completed)      Patient Active Problem List   Diagnosis Date Noted  . Supervision of high-risk pregnancy 12/18/2015  . Nausea and vomiting 01/22/2015  . Crohn's ileitis (H[place] 01/22/2015  . Diarrhea 01/22/2015  . Malnutrition of moderate degree (HE[place] 01/05/2015  . Nausea 01/03/2015  . 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 Absolute anemia   . CC (Crohn's colitis) (HBus[place] 11/02/2014  . Rectal bleeding 11/02/2014  . Crohn disease (HMoffat 11/01/2014  . Missed abortion 10/31/2014  . Gestational hypertension w/o significant proteinuria in 3rd trimester 07/14/2013  . Supervision of normal first pregnancy 05/03/2013  . Crohn's disease (HY[place] 05/03/2013  . 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 GBS planning TDAP offered  Rhogam given for RH negative Pediatrician: discussed. Infant feeding: plans to breastfeed. Maternity leave: N/A. Cigarette smoking: never smoked. Orders Placed This Encounter  Procedures  . POCT urinalysis dipstick   No orders of the defined types were placed in this encounter.   Follow up in 2 Weeks.

## 2016-03-25 ENCOUNTER — Ambulatory Visit (HOSPITAL_COMMUNITY)
Admission: RE | Admit: 2016-03-25 | Discharge: 2016-03-25 | Disposition: A | Discharge status: Home / Self Care | Payer: Medicaid Other | Source: Ambulatory Visit | Attending: Certified Nurse Midwife | Admitting: Certified Nurse Midwife

## 2016-03-25 ENCOUNTER — Encounter (HOSPITAL_COMMUNITY): Payer: Self-pay

## 2016-03-25 ENCOUNTER — Other Ambulatory Visit (HOSPITAL_COMMUNITY): Payer: Self-pay | Admitting: Obstetrics and Gynecology

## 2016-03-25 DIAGNOSIS — O09293 Supervision of pregnancy with other poor reproductive or obstetric history, third trimester: Secondary | ICD-10-CM | POA: Diagnosis not present

## 2016-03-25 DIAGNOSIS — K5090 Crohn's disease, unspecified, without complications: Secondary | ICD-10-CM

## 2016-03-25 DIAGNOSIS — Z3A31 31 weeks gestation of pregnancy: Secondary | ICD-10-CM | POA: Diagnosis not present

## 2016-03-25 DIAGNOSIS — O99613 Diseases of the digestive system complicating pregnancy, third trimester: Secondary | ICD-10-CM

## 2016-03-25 DIAGNOSIS — O09893 Supervision of other high risk pregnancies, third trimester: Secondary | ICD-10-CM

## 2016-03-25 DIAGNOSIS — O99612 Diseases of the digestive system complicating pregnancy, second trimester: Secondary | ICD-10-CM

## 2016-03-30 ENCOUNTER — Ambulatory Visit (INDEPENDENT_AMBULATORY_CARE_PROVIDER_SITE_OTHER): Payer: Medicaid Other | Admitting: Obstetrics

## 2016-03-30 VITALS — BP 122/79 | HR 94 | Temp 98.5°F | Wt 199.0 lb

## 2016-03-30 DIAGNOSIS — K219 Gastro-esophageal reflux disease without esophagitis: Secondary | ICD-10-CM

## 2016-03-30 DIAGNOSIS — Z3493 Encounter for supervision of normal pregnancy, unspecified, third trimester: Secondary | ICD-10-CM

## 2016-03-30 DIAGNOSIS — O219 Vomiting of pregnancy, unspecified: Secondary | ICD-10-CM

## 2016-03-30 DIAGNOSIS — J301 Allergic rhinitis due to pollen: Secondary | ICD-10-CM

## 2016-03-30 LAB — POCT URINALYSIS DIPSTICK
Bilirubin, UA: NEGATIVE
Blood, UA: NEGATIVE
Glucose, UA: NEGATIVE
Ketones, UA: NEGATIVE
Leukocytes, UA: NEGATIVE
Nitrite, UA: NEGATIVE
Protein, UA: NEGATIVE
Spec Grav, UA: 1.015
Urobilinogen, UA: NEGATIVE
pH, UA: 7

## 2016-03-30 MED ORDER — DOXYLAMINE-PYRIDOXINE 10-10 MG PO TBEC: DELAYED_RELEASE_TABLET | ORAL | Status: DC

## 2016-03-30 MED ORDER — PROMETHAZINE HCL 25 MG PO TABS: 25.0000 mg | ORAL_TABLET | Freq: Four times a day (QID) | ORAL | Status: DC | PRN

## 2016-03-30 MED ORDER — LORATADINE 10 MG PO TABS: 10.0000 mg | ORAL_TABLET | Freq: Every day | ORAL | Status: DC

## 2016-03-30 MED ORDER — OMEPRAZOLE 20 MG PO CPDR: 20.0000 mg | DELAYED_RELEASE_CAPSULE | Freq: Two times a day (BID) | ORAL | Status: DC

## 2016-03-30 NOTE — Progress Notes (Signed)
Patient concerned by ankle swelling.

## 2016-03-31 ENCOUNTER — Encounter: Payer: Self-pay | Admitting: Obstetrics

## 2016-03-31 NOTE — Progress Notes (Signed)
Subjective:    Jessica [name] is a 20 y.o. female being seen today for her obstetrical visit. She is at 26w3dgestation. Patient reports swelling of ankles. Fetal movement: normal.  Problem List Items Addressed This Visit    None    Visit Diagnoses    Prenatal care, third trimester    -  Primary    Relevant Orders    POCT urinalysis dipstick (Completed)    Nausea and vomiting during pregnancy        Relevant Medications    Doxylamine-Pyridoxine (DICLEGIS) 10-10 MG TBEC    Nausea and vomiting during pregnancy prior to [redacted] weeks gestation        Relevant Medications    promethazine (PHENERGAN) 25 MG tablet    Rhinitis due to pollen        Relevant Medications    loratadine (CLARITIN) 10 MG tablet    GERD without esophagitis        Relevant Medications    Doxylamine-Pyridoxine (DICLEGIS) 10-10 MG TBEC    omeprazole (PRILOSEC) 20 MG capsule      Patient Active Problem List   Diagnosis Date Noted  . Supervision of high-risk pregnancy 12/18/2015  . Nausea and vomiting 01/22/2015  . Crohn's ileitis (HK[place] 01/22/2015  . Diarrhea 01/22/2015  . Malnutrition of moderate degree (HD[place] 01/05/2015  . Nausea 01/03/2015  . 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 Absolute anemia   . CC (Crohn's colitis) (HMac[place] 11/02/2014  . Rectal bleeding 11/02/2014  . Crohn disease (HJunior 11/01/2014  . Missed abortion 10/31/2014  . Gestational hypertension w/o significant proteinuria in 3rd trimester 07/14/2013  . Supervision of normal first pregnancy 05/03/2013  . Crohn's disease (HTwin Lakes 05/03/2013  . 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 GERD  Plan:   Phenergan Rx Prilosec Rx    labs reviewed, problem list updated Consent  signed. GBS sent TDAP offered  Rhogam given for RH negative Pediatrician: discussed. Infant feeding: plans to breastfeed. Maternity leave: discussed. Cigarette smoking: never smoked.  Orders Placed This Encounter  Procedures  . POCT urinalysis dipstick   Meds ordered this encounter  Medications  . Doxylamine-Pyridoxine (DICLEGIS) 10-10 MG TBEC    Sig: 1 tab in AM, 1 tab mid afternoon 2 tabs at bedtime. Max dose 4 tabs daily.    Dispense:  100 tablet    Refill:  4  . promethazine (PHENERGAN) 25 MG tablet    Sig: Take 1 tablet (25 mg total) by mouth every 6 (six) hours as needed for nausea or vomiting.    Dispense:  30 tablet    Refill:  2  . loratadine (CLARITIN) 10 MG tablet    Sig: Take 1 tablet (10 mg total) by mouth daily.    Dispense:  30 tablet    Refill:  11  . omeprazole (PRILOSEC) 20 MG capsule    Sig: Take 1 capsule (20 mg total) by mouth 2 (two) times daily before a meal.    Dispense:  60 capsule    Refill:  5   Follow up in 2 Weeks.

## 2016-04-06 ENCOUNTER — Ambulatory Visit (INDEPENDENT_AMBULATORY_CARE_PROVIDER_SITE_OTHER): Payer: Medicaid Other | Admitting: Certified Nurse Midwife

## 2016-04-06 VITALS — BP 116/80 | HR 99 | Temp 99.5°F | Wt 199.0 lb

## 2016-04-06 DIAGNOSIS — Z202 Contact with and (suspected) exposure to infections with a predominantly sexual mode of transmission: Secondary | ICD-10-CM

## 2016-04-06 DIAGNOSIS — Z3493 Encounter for supervision of normal pregnancy, unspecified, third trimester: Secondary | ICD-10-CM

## 2016-04-06 LAB — POCT URINALYSIS DIPSTICK
Bilirubin, UA: NEGATIVE
Blood, UA: NEGATIVE
Glucose, UA: NEGATIVE
Ketones, UA: NEGATIVE
Nitrite, UA: NEGATIVE
Protein, UA: NEGATIVE
Spec Grav, UA: 1.015
Urobilinogen, UA: NEGATIVE
pH, UA: 6

## 2016-04-06 MED ORDER — AZITHROMYCIN 250 MG PO TABS: ORAL_TABLET | ORAL | Status: DC

## 2016-04-06 NOTE — Progress Notes (Signed)
Patient has no concerns

## 2016-04-06 NOTE — Progress Notes (Signed)
Subjective:    Jessica [name] is a 20 y.o. female being seen today for her obstetrical visit. She is at 55w2dgestation. Patient reports no complaints. Fetal movement: normal.  Problem List Items Addressed This Visit    None    Visit Diagnoses    Prenatal care, third trimester    -  Primary    Relevant Orders    POCT urinalysis dipstick (Completed)    Chlamydia contact          Patient Active Problem List   Diagnosis Date Noted  . Supervision of high-risk pregnancy 12/18/2015  . Nausea and vomiting 01/22/2015  . Crohn's ileitis (HL[place] 01/22/2015  . Diarrhea 01/22/2015  . Malnutrition of moderate degree (HBron[place] 01/05/2015  . Nausea 01/03/2015  . 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 Absolute anemia   . CC (Crohn's colitis) (HF[place] 11/02/2014  . Rectal bleeding 11/02/2014  . Crohn disease ([place] 11/01/2014  . Missed abortion 10/31/2014  . Gestational hypertension w/o significant proteinuria in 3rd trimester 07/14/2013  . Supervision of normal first pregnancy 05/03/2013  . Crohn's disease (HBelle[place] 05/03/2013  . 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 GBS planning TDAP offered  Rhogam given for RH negative Pediatrician: discussed. Infant feeding: plans to breastfeed. Maternity leave: discussed. Cigarette smoking: never smoked. Orders Placed This Encounter  Procedures  . POCT urinalysis dipstick   Meds ordered this encounter  Medications  . DISCONTD: azithromycin (ZITHROMAX) 250 MG tablet    Sig: Take 4 tablets all together now.    Dispense:  4 tablet    Refill:  0   Follow up in 2 Weeks.

## 2016-04-06 NOTE — Progress Notes (Deleted)
Patient ID: GLENDY BARSANTI, female   DOB: 01-Dec-1996, 20 y.o.   MRN: [id number]  Chief Complaint  Patient presents with  . Routine Prenatal Visit    HPI EMER ONNEN is a 20 y.o. female.  Here for testing  HPI  Past Medical History  Diagnosis Date  . Crohn's disease ([place])   . Anemia   . Osteoporosis     Past Surgical History  Procedure Laterality Date  . Small intestine surgery    . Incision and drainage Left     arm  . Dilation and evacuation N/A 10/31/2014    Procedure: DILATATION AND EVACUATION;  Surgeon: Lahoma Crocker, MD;  Location: [facility];  Service: Gynecology;  Laterality: N/A;  . Flexible sigmoidoscopy N/A 11/04/2014    Procedure: FLEXIBLE SIGMOIDOSCOPY;  Surgeon: [name], MD;  Location: [facility];  Service: Endoscopy;  Laterality: N/A;  . Ileostomy closure N/A 12/18/2014    Procedure: LOOP ILEOSTOMY REVERSAL;  Surgeon: [name], MD;  Location: [facility];  Service: General;  Laterality: N/A;  . Esophagogastroduodenoscopy N/A 01/22/2015    Procedure: ESOPHAGOGASTRODUODENOSCOPY (EGD);  Surgeon: [name], MD;  Location: [facility];  Service: Endoscopy;  Laterality: N/A;  . Colonoscopy N/A 01/22/2015    Procedure: COLONOSCOPY;  Surgeon: [name], MD;  Location: [facility];  Service: Endoscopy;  Laterality: N/A;    Family History  Problem Relation Age of Onset  . Diabetes Father   . Stroke Neg Hx   . Diabetes Maternal Grandmother   . Heart disease Maternal Grandmother   . Hypertension Maternal Grandmother   . Kidney disease Maternal Grandmother   . Lung cancer Maternal Grandfather   . Hyperlipidemia Maternal Grandfather   . Colon cancer Neg Hx     Social History Social History  Substance Use Topics  . Smoking status: Never Smoker   . Smokeless tobacco: Never Used  . Alcohol Use: No    No Known Allergies  Current Outpatient Prescriptions  Medication Sig Dispense Refill  . Doxylamine-Pyridoxine (DICLEGIS) 10-10 MG TBEC 1 tab in  AM, 1 tab mid afternoon 2 tabs at bedtime. Max dose 4 tabs daily. 100 tablet 4  . HUMIRA PEN 40 MG/0.8ML PNKT Inject 40 mg into the skin every 14 (fourteen) days. 2 each 3  . loratadine (CLARITIN) 10 MG tablet Take 1 tablet (10 mg total) by mouth daily. 30 tablet 11  . omeprazole (PRILOSEC) 20 MG capsule Take 1 capsule (20 mg total) by mouth 2 (two) times daily before a meal. 60 capsule 5  . ondansetron (ZOFRAN) 4 MG tablet Take 1 tablet (4 mg total) by mouth daily as needed. 30 tablet 1  . Prenat-Fe Poly-Methfol-FA-DHA (VITAFOL ULTRA) 29-0.6-0.4-200 MG CAPS Take 1 capsule by mouth daily. 30 capsule 11  . promethazine (PHENERGAN) 25 MG tablet Take 1 tablet (25 mg total) by mouth every 6 (six) hours as needed for nausea or vomiting. 30 tablet 2  . azithromycin (ZITHROMAX) 250 MG tablet Take 4 tablets all together now. 4 tablet 0   No current facility-administered medications for this visit.    Review of Systems Review of Systems Constitutional: negative for fatigue and weight loss Respiratory: negative for cough and wheezing Cardiovascular: negative for chest pain, fatigue and palpitations Gastrointestinal: negative for abdominal pain and change in bowel habits Genitourinary:negative Integument/breast: negative for nipple discharge Musculoskeletal:negative for myalgias Neurological: negative for gait problems and tremors Behavioral/Psych: negative for abusive relationship, depression Endocrine: negative for temperature intolerance  Blood pressure 116/80, pulse 99, temperature 99.5 F (37.5 C), weight 199 lb (90.266 kg), last menstrual period 08/17/2015.  Physical Exam Physical Exam General:   alert  Skin:   no rash or abnormalities  Lungs:   clear to auscultation bilaterally  Heart:   regular rate and rhythm, S1, S2 normal, no murmur, click, rub or gallop  Breasts:   normal without suspicious masses, skin or nipple changes or axillary nodes  Abdomen:  normal findings: no  organomegaly, soft, non-tender and no hernia  Pelvis:  External genitalia: normal general appearance Urinary system: urethral meatus normal and bladder without fullness, nontender Vaginal: normal without tenderness, induration or masses Cervix: normal appearance Adnexa: normal bimanual exam Uterus: anteverted and non-tender, normal size    ***% of *** min visit spent on counseling and coordination of care.   Data Reviewed ***  Assessment     ***    Plan    Orders Placed This Encounter  Procedures  . POCT urinalysis dipstick   Meds ordered this encounter  Medications  . azithromycin (ZITHROMAX) 250 MG tablet    Sig: Take 4 tablets all together now.    Dispense:  4 tablet    Refill:  0    Need to obtain previous records Possible management options include:*** Follow up as needed.  ***

## 2016-04-20 ENCOUNTER — Ambulatory Visit (INDEPENDENT_AMBULATORY_CARE_PROVIDER_SITE_OTHER): Payer: Medicaid Other | Admitting: Certified Nurse Midwife

## 2016-04-20 VITALS — BP 133/88 | HR 78 | Wt 201.0 lb

## 2016-04-20 DIAGNOSIS — Z3483 Encounter for supervision of other normal pregnancy, third trimester: Secondary | ICD-10-CM

## 2016-04-20 LAB — POCT URINALYSIS DIPSTICK
Bilirubin, UA: NEGATIVE
Blood, UA: NEGATIVE
Glucose, UA: NEGATIVE
Ketones, UA: NEGATIVE
Leukocytes, UA: NEGATIVE
Nitrite, UA: NEGATIVE
Protein, UA: NEGATIVE
Spec Grav, UA: <=1.005
Urobilinogen, UA: NEGATIVE
pH, UA: 7

## 2016-04-20 NOTE — Patient Instructions (Signed)
Group B streptococcus (GBS) is a type of bacteria often found in healthy women. GBS is not the same as the bacteria that causes strep throat. You may have GBS in your vagina, rectum, or bladder. GBS does not spread through sexual contact, but it can be passed to a baby during childbirth. This can be dangerous for your baby. It is not dangerous to you and usually does not cause any symptoms. Your health care provider may test you for GBS when your pregnancy is between 35 and 37 weeks. GBS is dangerous only during birth, so there is no need to test for it earlier. It is possible to have GBS during pregnancy and never pass it to your baby. If your test results are positive for GBS, your health care provider may recommend giving you antibiotic medicine during delivery to make sure your baby stays healthy. RISK FACTORS You are more likely to pass GBS to your baby if:   Your water breaks (ruptured membrane) or you go into labor before 37 weeks.  Your water breaks 18 hours before you deliver.  You passed GBS during a previous pregnancy.  You have a urinary tract infection caused by GBS any time during pregnancy.  You have a fever during labor. SYMPTOMS Most women who have GBS do not have any symptoms. If you have a urinary tract infection caused by GBS, you might have frequent or painful urination and fever. Babies who get GBS usually show symptoms within 7 days of birth. Symptoms may include:   Breathing problems.  Heart and blood pressure problems.  Digestive and kidney problems. DIAGNOSIS Routine screening for GBS is recommended for all pregnant women. A health care provider takes a sample of the fluid in your vagina and rectum with a swab. It is then sent to a lab to be checked for GBS. A sample of your urine may also be checked for the bacteria.  TREATMENT If you test positive for GBS, you may need treatment with an antibiotic medicine during labor. As soon as you go into labor, or as soon as  your membranes rupture, you will get the antibiotic medicine through an IV access. You will continue to get the medicine until after you give birth. You do not need antibiotic medicine if you are having a cesarean delivery.If your baby shows signs or symptoms of GBS after birth, your baby can also be treated with an antibiotic medicine. HOME CARE INSTRUCTIONS   Take all antibiotic medicine as prescribed by your health care provider. Only take medicine as directed.   Continue with prenatal visits and care.   Keep all follow-up appointments.  SEEK MEDICAL CARE IF:   You have pain when you urinate.   You have to urinate frequently.   You have a fever.  SEEK IMMEDIATE MEDICAL CARE IF:   Your membranes rupture.  You go into labor.   This information is not intended to replace advice given to you by your health care provider. Make sure you discuss any questions you have with your health care provider.   Document Released: 03/13/2008 Document Revised: 12/10/2013 Document Reviewed: 09/27/2013 Elsevier Interactive Patient Education [employer].

## 2016-04-20 NOTE — Progress Notes (Signed)
Subjective:    Jessica [name] is a 20 y.o. female being seen today for her obstetrical visit. She is at 75w2dgestation. Patient reports no complaints. Fetal movement: normal.  Problem List Items Addressed This Visit    None    Visit Diagnoses    Encounter for supervision of other normal pregnancy in third trimester    -  Primary    Relevant Orders    Strep Gp B NAA    POCT urinalysis dipstick (Completed)    NuSwab Vaginitis Plus (VG+)      Patient Active Problem List   Diagnosis Date Noted  . Supervision of high-risk pregnancy 12/18/2015  . Nausea and vomiting 01/22/2015  . Crohn's ileitis ([facility] 01/22/2015  . Diarrhea 01/22/2015  . Malnutrition of moderate degree ([place] 01/05/2015  . Nausea 01/03/2015  . 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 Absolute anemia   . CC (Crohn's colitis) ([facility] 11/02/2014  . Rectal bleeding 11/02/2014  . Crohn disease ([facility] 11/01/2014  . Missed abortion 10/31/2014  . Gestational hypertension w/o significant proteinuria in 3rd trimester 07/14/2013  . Supervision of normal first pregnancy 05/03/2013  . Crohn's disease ([facility] 05/03/2013  . [facility]le[facility] in place ([facility] 05/03/2013   Objective:    BP 133/88 mmHg  Pulse 78  Wt 201 lb (91.173 kg)  LMP 08/17/2015 (Exact Date) FHT:  145 BPM  Uterine Size: size equals dates  Presentation: unsure   Cervix: 1 cm dilated, posterior, thick, floating parts, not cephalic.   Assessment:    Pregnancy @ 368w2deeks    Plan:     labs reviewed, problem list updated Consent signed. GBS sent TDAP offered  Rhogam given for RH negative Pediatrician: discussed. Infant feeding: plans to breastfeed. Maternity leave: discussed. Cigarette smoking: never smoked. Orders Placed This Encounter  Procedures  . Strep Gp B NAA  . NuSwab Vaginitis Plus (VG+)  . POCT urinalysis dipstick   No orders of the defined types were placed in this encounter.   Follow up in 1 Week.

## 2016-04-22 ENCOUNTER — Encounter (HOSPITAL_COMMUNITY): Payer: Self-pay

## 2016-04-22 ENCOUNTER — Other Ambulatory Visit: Payer: Self-pay | Admitting: Certified Nurse Midwife

## 2016-04-22 ENCOUNTER — Ambulatory Visit (HOSPITAL_COMMUNITY)
Admission: RE | Admit: 2016-04-22 | Discharge: 2016-04-22 | Disposition: A | Discharge status: Home / Self Care | Payer: Medicaid Other | Source: Ambulatory Visit | Attending: Certified Nurse Midwife | Admitting: Certified Nurse Midwife

## 2016-04-22 DIAGNOSIS — O99612 Diseases of the digestive system complicating pregnancy, second trimester: Secondary | ICD-10-CM

## 2016-04-22 DIAGNOSIS — O09293 Supervision of pregnancy with other poor reproductive or obstetric history, third trimester: Secondary | ICD-10-CM | POA: Insufficient documentation

## 2016-04-22 DIAGNOSIS — Z3A35 35 weeks gestation of pregnancy: Secondary | ICD-10-CM | POA: Insufficient documentation

## 2016-04-22 DIAGNOSIS — K5090 Crohn's disease, unspecified, without complications: Secondary | ICD-10-CM

## 2016-04-22 DIAGNOSIS — B3731 Acute candidiasis of vulva and vagina: Secondary | ICD-10-CM

## 2016-04-22 DIAGNOSIS — B373 Candidiasis of vulva and vagina: Secondary | ICD-10-CM

## 2016-04-22 LAB — STREP GP B NAA: Strep Gp B NAA: POSITIVE — AB

## 2016-04-22 LAB — NUSWAB VAGINITIS PLUS (VG+)
Candida albicans, NAA: POSITIVE — AB
Candida glabrata, NAA: NEGATIVE
Chlamydia trachomatis, NAA: NEGATIVE
Neisseria gonorrhoeae, NAA: NEGATIVE
Trich vag by NAA: NEGATIVE

## 2016-04-22 MED ORDER — FLUCONAZOLE 100 MG PO TABS: 100.0000 mg | ORAL_TABLET | Freq: Once | ORAL | Status: DC

## 2016-04-25 ENCOUNTER — Encounter: Payer: Self-pay | Admitting: *Deleted

## 2016-04-26 ENCOUNTER — Other Ambulatory Visit: Payer: Self-pay | Admitting: Certified Nurse Midwife

## 2016-04-27 ENCOUNTER — Ambulatory Visit (INDEPENDENT_AMBULATORY_CARE_PROVIDER_SITE_OTHER): Payer: Medicaid Other | Admitting: Certified Nurse Midwife

## 2016-04-27 VITALS — BP 128/91 | HR 100 | Temp 98.7°F | Wt 200.0 lb

## 2016-04-27 DIAGNOSIS — Z3493 Encounter for supervision of normal pregnancy, unspecified, third trimester: Secondary | ICD-10-CM

## 2016-04-27 LAB — POCT URINALYSIS DIPSTICK
Bilirubin, UA: NEGATIVE
Blood, UA: NEGATIVE
Glucose, UA: NEGATIVE
Ketones, UA: NEGATIVE
Nitrite, UA: NEGATIVE
Protein, UA: NEGATIVE
Spec Grav, UA: 1.015
Urobilinogen, UA: NEGATIVE
pH, UA: 6

## 2016-04-27 NOTE — Progress Notes (Signed)
Subjective:    Jessica [name] is a 20 y.o. female being seen today for her obstetrical visit. She is at 5w2dgestation. Patient reports backache, no bleeding, no leaking and occasional contractions. Fetal movement: normal.  Problem List Items Addressed This Visit    None    Visit Diagnoses    Prenatal care in third trimester    -  Primary    Relevant Orders    POCT Urinalysis Dipstick (Completed)      Patient Active Problem List   Diagnosis Date Noted  . Supervision of high-risk pregnancy 12/18/2015  . Nausea and vomiting 01/22/2015  . Crohn's ileitis ([facility] 01/22/2015  . Diarrhea 01/22/2015  . Malnutrition of moderate degree ([facility] 01/05/2015  . Nausea 01/03/2015  . 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 Absolute anemia   . CC (Crohn's colitis) ([facility] 11/02/2014  . Rectal bleeding 11/02/2014  . Crohn disease ([facility] 11/01/2014  . Missed abortion 10/31/2014  . Gestational hypertension w/o significant proteinuria in 3rd trimester 07/14/2013  . Supervision of normal first pregnancy 05/03/2013  . Crohn's disease ([facility] 05/03/2013  . [facility] ([facility] 05/03/2013   Objective:    BP 128/91 mmHg  Pulse 100  Temp(Src) 98.7 F (37.1 C)  Wt 200 lb (90.719 kg)  LMP 08/17/2015 (Exact Date) FHT:  145 BPM  Uterine Size: size equals dates  Presentation: cephalic   Cervix: 0.5 cm dilated, long, thick, posterior, soft, bollotable  Assessment:    Pregnancy @ 323w2deeks   Chron's disease: stable  H/O PIH  Plan:     labs reviewed, problem list updated Consent signed. GBS results reviewed TDAP offered  Rhogam given for RH negative Pediatrician: discussed. Infant feeding: plans to breastfeed. Maternity leave: discussed. Cigarette smoking: never smoked. Orders Placed This Encounter  Procedures  . POCT Urinalysis Dipstick   No orders of the defined types were placed in this encounter.   Follow up in 1 Week.

## 2016-04-27 NOTE — Progress Notes (Signed)
C/o back pain, right side cramping

## 2016-04-28 ENCOUNTER — Encounter (HOSPITAL_COMMUNITY): Payer: Self-pay | Admitting: *Deleted

## 2016-04-28 ENCOUNTER — Inpatient Hospital Stay (HOSPITAL_COMMUNITY)
Admission: AD | Admit: 2016-04-28 | Discharge: 2016-04-29 | Disposition: A | Discharge status: Home / Self Care | Payer: Medicaid Other | Source: Ambulatory Visit | Attending: Obstetrics | Admitting: Obstetrics

## 2016-04-28 DIAGNOSIS — O26899 Other specified pregnancy related conditions, unspecified trimester: Secondary | ICD-10-CM

## 2016-04-28 DIAGNOSIS — Z3A36 36 weeks gestation of pregnancy: Secondary | ICD-10-CM | POA: Insufficient documentation

## 2016-04-28 DIAGNOSIS — R109 Unspecified abdominal pain: Secondary | ICD-10-CM | POA: Insufficient documentation

## 2016-04-28 DIAGNOSIS — O9989 Other specified diseases and conditions complicating pregnancy, childbirth and the puerperium: Secondary | ICD-10-CM | POA: Diagnosis not present

## 2016-04-28 DIAGNOSIS — O26893 Other specified pregnancy related conditions, third trimester: Secondary | ICD-10-CM | POA: Diagnosis not present

## 2016-04-28 LAB — CBC
HCT: 30.7 % — ABNORMAL LOW (ref 36.0–46.0)
Hemoglobin: 10.1 g/dL — ABNORMAL LOW (ref 12.0–15.0)
MCH: 27.7 pg (ref 26.0–34.0)
MCHC: 32.9 g/dL (ref 30.0–36.0)
MCV: 84.1 fL (ref 78.0–100.0)
Platelets: 239 10*3/uL (ref 150–400)
RBC: 3.65 MIL/uL — ABNORMAL LOW (ref 3.87–5.11)
RDW: 12.9 % (ref 11.5–15.5)
WBC: 7.9 10*3/uL (ref 4.0–10.5)

## 2016-04-28 NOTE — MAU Note (Signed)
PT SAYS SHE HAS BEEN HURTING  IN LOWER ABD     SINCE  Monday-  BUT TODAY IS CONSTANT PAIN.      WENT  TO DR  YESTERDAY-   VE  FT.    DENIES HSV AND MRSA.     GBS- POSITIVE

## 2016-04-29 ENCOUNTER — Other Ambulatory Visit: Payer: Self-pay | Admitting: Certified Nurse Midwife

## 2016-04-29 ENCOUNTER — Telehealth: Payer: Self-pay | Admitting: *Deleted

## 2016-04-29 ENCOUNTER — Telehealth: Payer: Self-pay | Admitting: Gastroenterology

## 2016-04-29 DIAGNOSIS — R109 Unspecified abdominal pain: Secondary | ICD-10-CM | POA: Diagnosis not present

## 2016-04-29 DIAGNOSIS — O9989 Other specified diseases and conditions complicating pregnancy, childbirth and the puerperium: Secondary | ICD-10-CM | POA: Diagnosis not present

## 2016-04-29 DIAGNOSIS — R197 Diarrhea, unspecified: Secondary | ICD-10-CM

## 2016-04-29 LAB — URINE MICROSCOPIC-ADD ON

## 2016-04-29 LAB — URINALYSIS, ROUTINE W REFLEX MICROSCOPIC
Bilirubin Urine: NEGATIVE
Glucose, UA: NEGATIVE mg/dL
Hgb urine dipstick: NEGATIVE
Ketones, ur: 15 mg/dL — AB
Nitrite: NEGATIVE
Protein, ur: NEGATIVE mg/dL
Specific Gravity, Urine: 1.02 (ref 1.005–1.030)
pH: 6 (ref 5.0–8.0)

## 2016-04-29 LAB — COMPREHENSIVE METABOLIC PANEL
ALT: 12 U/L — ABNORMAL LOW (ref 14–54)
AST: 20 U/L (ref 15–41)
Albumin: 3 g/dL — ABNORMAL LOW (ref 3.5–5.0)
Alkaline Phosphatase: 137 U/L — ABNORMAL HIGH (ref 38–126)
Anion gap: 9 (ref 5–15)
BUN: 9 mg/dL (ref 6–20)
CO2: 18 mmol/L — ABNORMAL LOW (ref 22–32)
Calcium: 9 mg/dL (ref 8.9–10.3)
Chloride: 109 mmol/L (ref 101–111)
Creatinine, Ser: 0.41 mg/dL — ABNORMAL LOW (ref 0.44–1.00)
GFR calc Af Amer: >60 mL/min (ref >60)
GFR calc non Af Amer: >60 mL/min (ref >60)
Glucose, Bld: 88 mg/dL (ref 65–99)
Potassium: 3.8 mmol/L (ref 3.5–5.1)
Sodium: 136 mmol/L (ref 135–145)
Total Bilirubin: 0.3 mg/dL (ref 0.3–1.2)
Total Protein: 7.3 g/dL (ref 6.5–8.1)

## 2016-04-29 MED ORDER — OXYCODONE-ACETAMINOPHEN 5-325 MG PO TABS
2.0000 | ORAL_TABLET | Freq: Once | ORAL | Status: AC
  Administered 2016-04-29: 2 via ORAL
  Filled 2016-04-29: qty 2

## 2016-04-29 MED ORDER — OXYCODONE-ACETAMINOPHEN 5-325 MG PO TABS
1.0000 | ORAL_TABLET | ORAL | Status: DC | PRN
Start: 2016-04-29 — End: 2016-05-03

## 2016-04-29 NOTE — Telephone Encounter (Signed)
Pt called to office for follow up after hospital visit with Crohn's flare.  Attempt to return call.  LM on VM advising pt to be seen in office on Monday,05/02/16 at 9am.  Pt was added to schedule. Advised to call office if unable to come at that time.  Per RDenney, pt needs to be seen on Monday for follow up.

## 2016-04-29 NOTE — Discharge Instructions (Signed)

## 2016-04-29 NOTE — Telephone Encounter (Signed)
Spoke with patient and she will come for c.diff kit. She will talk with her GYN and let him know Dr. Ozella Rocks recommendations.

## 2016-04-29 NOTE — Telephone Encounter (Signed)
How many weeks pregnant is she at this time? She was supposed to continue Humira throughout pregnancy and hold last dose around 34-36 weeks, and resume postpartum. I am assuming she is near delivery at this time? I hope she has been able to get ahold of her OB who needs to be aware of this. Her labs were reviewed and look okay. She should have stool for C Diff testing to ensure negative. Otherwise, if her last dose of Humira was 2 weeks ago, she can resume this now, or we can consider steroids if she thinks she is flaring. I'm okay with resuming Humira now x 1 dose which would be the preference, if her OB is okay with it. Thanks

## 2016-04-29 NOTE — Telephone Encounter (Signed)
Okay thanks. If she is 36 weeks I will still give one more dose of Humira

## 2016-04-29 NOTE — Telephone Encounter (Signed)
She is now 36 weeks.

## 2016-04-29 NOTE — Telephone Encounter (Signed)
Spoke with patient and she states she stopped her Humira about 2 weeks ago. She went to [facility] last night due to RLQ pain and was told it was "not the baby." She thinks she may have a Crohn's flare. She reports diarrhea, RLQ pain, nausea and vomiting for the last few days. Denies bleeding or fever. She reports diarrhea x 3 yesterday and states she has vomited a couple times in the last few days. She has also called her OB/GYN this AM.

## 2016-04-29 NOTE — MAU Provider Note (Signed)
History     CSN: [id number]  Arrival date and time: 04/28/16 2242   First Provider Initiated Contact with Patient 04/29/16 0042      Chief Complaint  Patient presents with  . Contractions   HPI Comments: Jessica [name] is a 20 y.o. G3P1011 at 37w4dwho presents today with lowe abdominal pain. She states that she has a hx of Chron's and she was on humira. She was to stop it around 36 weeks. She last had it about 3 weeks ago, and it is only given every 2 weeks. She states that the pain started 4 days ago. She has not called her doctor's office to let them know. She has only tried tylenol for the pain. She states that did not help. She denies any VB or LOF. She states that the fetus has been moving normally. She denies any contractions.  Abdominal Pain This is a new problem. Episode onset: 4 days ago. The onset quality is gradual. The problem occurs intermittently. The problem has been unchanged. The pain is located in the RLQ. The pain is at a severity of 6/10. The quality of the pain is sharp. The abdominal pain does not radiate. Associated symptoms include diarrhea, nausea and vomiting. Pertinent negatives include no constipation, dysuria, fever or frequency. Nothing aggravates the pain. The pain is relieved by nothing. She has tried nothing for the symptoms.    Past Medical History  Diagnosis Date  . [place]'s disease ([place]   . Anemia   . Osteoporosis     Past Surgical History  Procedure Laterality Date  . Small intestine surgery    . Incision and drainage Left     arm  . Dilation and evacuation N/A 10/31/2014    Procedure: DILATATION AND EVACUATION;  Surgeon: LLahoma Crocker MD;  [facility];  Service: Gynecology;  Laterality: N/A;  . Flexible sigmoidoscopy N/A 11/04/2014    Procedure: FLEXIBLE SIGMOIDOSCOPY;  Surgeon: DLafayette Dragon MD;  [facility];  Service: Endoscopy;  Laterality: N/A;  . Ileostomy closure N/A 12/18/2014    Procedure: LOOP ILEOSTOMY  REVERSAL;  Surgeon: ALeighton Ruff MD;  [facility]: [facility];  Service: General;  Laterality: N/A;  . Esophagogastroduodenoscopy N/A 01/22/2015    Procedure: ESOPHAGOGASTRODUODENOSCOPY (EGD);  Surgeon: DLafayette Dragon MD;  [facility];  Service: Endoscopy;  Laterality: N/A;  . Colonoscopy N/A 01/22/2015    Procedure: COLONOSCOPY;  Surgeon: DLafayette Dragon MD;  [facility];  Service: Endoscopy;  Laterality: N/A;    Family History  Problem Relation Age of Onset  . Diabetes Father   . Stroke Neg Hx   . Diabetes Maternal Grandmother   . Heart disease Maternal Grandmother   . Hypertension Maternal Grandmother   . Kidney disease Maternal Grandmother   . Lung cancer Maternal Grandfather   . Hyperlipidemia Maternal Grandfather   . Colon cancer Neg Hx     Social History  Substance Use Topics  . Smoking status: Never Smoker   . Smokeless tobacco: Never Used  . Alcohol Use: No    Allergies: No Known Allergies  Prescriptions prior to admission  Medication Sig Dispense Refill Last Dose  . loratadine (CLARITIN) 10 MG tablet Take 1 tablet (10 mg total) by mouth daily. 30 tablet 11 Past Week at Unknown time  . omeprazole (PRILOSEC) 20 MG capsule Take 1 capsule (20 mg total) by mouth 2 (two) times daily before a meal. 60 capsule 5 04/28/2016 at Unknown time  . Prenat-Fe Poly-Methfol-FA-DHA (VITAFOL  ULTRA) 29-0.6-0.4-200 MG CAPS Take 1 capsule by mouth daily. 30 capsule 11 Past Week at Unknown time  . promethazine (PHENERGAN) 25 MG tablet Take 1 tablet (25 mg total) by mouth every 6 (six) hours as needed for nausea or vomiting. 30 tablet 2 Past Week at Unknown time    Review of Systems  Constitutional: Negative for fever and chills.  Gastrointestinal: Positive for nausea, vomiting, abdominal pain and diarrhea. Negative for constipation.  Genitourinary: Negative for dysuria, urgency and frequency.   Physical Exam   Blood pressure 124/92, pulse 97, temperature 98.5 F (36.9 C),  temperature source Oral, resp. rate 20, height 5' 6"  (1.676 m), weight 91.286 kg (201 lb 4 oz), last menstrual period 08/17/2015.  Physical Exam  Nursing note and vitals reviewed. Constitutional: She is oriented to person, place, and time. She appears well-developed and well-nourished. No distress.  HENT:  Head: Normocephalic.  Cardiovascular: Normal rate.   Respiratory: Effort normal.  GI: Soft. Bowel sounds are normal. There is no tenderness. There is no rebound.  Genitourinary:  Cervix closed per RN   Neurological: She is alert and oriented to person, place, and time.  Skin: Skin is warm and dry.  Psychiatric: She has a normal mood and affect.   FHT 135, moderate with 15x15 accels, no decels Toco: no UCs  MAU Course  Procedures  MDM Patient has had percocet. She reports that her pain has improved. Will dc with percocet for pain management. FU with the office for a plan for delivery.   Assessment and Plan   1. Abdominal pain in pregnancy, antepartum    DC home Comfort measures reviewed  3rd Trimester precautions  PTL precautions  Fetal kick counts RX: percocet PRN #[id number]  Return to MAU as needed FU with OB as planned  Follow-up Information    Follow up with Shelly Bombard, MD.   Specialty:  Obstetrics and Gynecology   Why:  As scheduled   Contact information:   [address] [place] [id number] [phone]         Mathis Bud 04/29/2016, 12:44 AM

## 2016-05-02 ENCOUNTER — Ambulatory Visit (INDEPENDENT_AMBULATORY_CARE_PROVIDER_SITE_OTHER): Payer: Medicaid Other | Admitting: Obstetrics

## 2016-05-02 ENCOUNTER — Other Ambulatory Visit: Payer: Self-pay | Admitting: Certified Nurse Midwife

## 2016-05-02 VITALS — BP 135/95 | HR 96 | Temp 98.7°F | Wt 202.0 lb

## 2016-05-02 DIAGNOSIS — Z3493 Encounter for supervision of normal pregnancy, unspecified, third trimester: Secondary | ICD-10-CM

## 2016-05-02 LAB — POCT URINALYSIS DIPSTICK
Bilirubin, UA: NEGATIVE
Blood, UA: NEGATIVE
Glucose, UA: NEGATIVE
Ketones, UA: NEGATIVE
Nitrite, UA: NEGATIVE
Spec Grav, UA: 1.015
Urobilinogen, UA: NEGATIVE
pH, UA: 6

## 2016-05-02 NOTE — Progress Notes (Signed)
Pt still experiencing abdominal pain since recent hospital visit, has also noticed bloody show.

## 2016-05-03 ENCOUNTER — Inpatient Hospital Stay (HOSPITAL_COMMUNITY): Payer: Medicaid Other | Admitting: Anesthesiology

## 2016-05-03 ENCOUNTER — Encounter: Payer: Self-pay | Admitting: Obstetrics

## 2016-05-03 ENCOUNTER — Encounter (HOSPITAL_COMMUNITY): Payer: Self-pay

## 2016-05-03 ENCOUNTER — Inpatient Hospital Stay (HOSPITAL_COMMUNITY)
Admission: RE | Admit: 2016-05-03 | Discharge: 2016-05-06 | DRG: 775 | Disposition: A | Discharge status: Home / Self Care | Payer: Medicaid Other | Source: Ambulatory Visit | Attending: Obstetrics | Admitting: Obstetrics

## 2016-05-03 DIAGNOSIS — K5090 Crohn's disease, unspecified, without complications: Secondary | ICD-10-CM | POA: Diagnosis present

## 2016-05-03 DIAGNOSIS — D649 Anemia, unspecified: Secondary | ICD-10-CM | POA: Diagnosis present

## 2016-05-03 DIAGNOSIS — O99824 Streptococcus B carrier state complicating childbirth: Secondary | ICD-10-CM | POA: Diagnosis present

## 2016-05-03 DIAGNOSIS — Z801 Family history of malignant neoplasm of trachea, bronchus and lung: Secondary | ICD-10-CM | POA: Diagnosis not present

## 2016-05-03 DIAGNOSIS — O9902 Anemia complicating childbirth: Secondary | ICD-10-CM | POA: Diagnosis present

## 2016-05-03 DIAGNOSIS — Z3A37 37 weeks gestation of pregnancy: Secondary | ICD-10-CM | POA: Diagnosis not present

## 2016-05-03 DIAGNOSIS — Z833 Family history of diabetes mellitus: Secondary | ICD-10-CM

## 2016-05-03 DIAGNOSIS — Z8249 Family history of ischemic heart disease and other diseases of the circulatory system: Secondary | ICD-10-CM

## 2016-05-03 DIAGNOSIS — O9962 Diseases of the digestive system complicating childbirth: Principal | ICD-10-CM | POA: Diagnosis present

## 2016-05-03 DIAGNOSIS — K5010 Crohn's disease of large intestine without complications: Secondary | ICD-10-CM | POA: Diagnosis present

## 2016-05-03 DIAGNOSIS — M810 Age-related osteoporosis without current pathological fracture: Secondary | ICD-10-CM | POA: Diagnosis present

## 2016-05-03 LAB — CBC
HCT: 28.9 % — ABNORMAL LOW (ref 36.0–46.0)
HCT: 29.9 % — ABNORMAL LOW (ref 36.0–46.0)
Hemoglobin: 9.3 g/dL — ABNORMAL LOW (ref 12.0–15.0)
Hemoglobin: 9.6 g/dL — ABNORMAL LOW (ref 12.0–15.0)
MCH: 27.3 pg (ref 26.0–34.0)
MCH: 27 pg (ref 26.0–34.0)
MCHC: 32.2 g/dL (ref 30.0–36.0)
MCHC: 32.1 g/dL (ref 30.0–36.0)
MCV: 84.8 fL (ref 78.0–100.0)
MCV: 84.2 fL (ref 78.0–100.0)
Platelets: 238 10*3/uL (ref 150–400)
Platelets: 214 10*3/uL (ref 150–400)
RBC: 3.41 MIL/uL — ABNORMAL LOW (ref 3.87–5.11)
RBC: 3.55 MIL/uL — ABNORMAL LOW (ref 3.87–5.11)
RDW: 13.2 % (ref 11.5–15.5)
RDW: 13 % (ref 11.5–15.5)
WBC: 9.1 10*3/uL (ref 4.0–10.5)
WBC: 8.7 10*3/uL (ref 4.0–10.5)

## 2016-05-03 LAB — TYPE AND SCREEN
ABO/RH(D): AB POS
Antibody Screen: NEGATIVE

## 2016-05-03 LAB — RPR: RPR Ser Ql: NONREACTIVE

## 2016-05-03 MED ORDER — TERBUTALINE SULFATE 1 MG/ML IJ SOLN
0.2500 mg | Freq: Once | INTRAMUSCULAR | Status: DC | PRN
  Filled 2016-05-03: qty 1

## 2016-05-03 MED ORDER — LACTATED RINGERS IV SOLN
INTRAVENOUS | Status: DC
  Administered 2016-05-03 – 2016-05-04 (×4): via INTRAVENOUS

## 2016-05-03 MED ORDER — OXYTOCIN 40 UNITS IN LACTATED RINGERS INFUSION - SIMPLE MED: 1.0000 m[IU]/min | INTRAVENOUS | Status: DC

## 2016-05-03 MED ORDER — MISOPROSTOL 25 MCG QUARTER TABLET
25.0000 ug | ORAL_TABLET | ORAL | Status: DC | PRN
  Administered 2016-05-03: 25 ug via VAGINAL
  Filled 2016-05-03: qty 0.25
  Filled 2016-05-03: qty 1

## 2016-05-03 MED ORDER — PHENYLEPHRINE 40 MCG/ML (10ML) SYRINGE FOR IV PUSH (FOR BLOOD PRESSURE SUPPORT)
80.0000 ug | PREFILLED_SYRINGE | INTRAVENOUS | Status: DC | PRN
Start: 2016-05-03 — End: 2016-05-04
  Filled 2016-05-03: qty 5

## 2016-05-03 MED ORDER — BUTORPHANOL TARTRATE 1 MG/ML IJ SOLN: 1.0000 mg | INTRAMUSCULAR | Status: DC | PRN

## 2016-05-03 MED ORDER — LACTATED RINGERS IV SOLN: 500.0000 mL | Freq: Once | INTRAVENOUS | Status: DC

## 2016-05-03 MED ORDER — FENTANYL 2.5 MCG/ML BUPIVACAINE 1/10 % EPIDURAL INFUSION (WH - ANES)
14.0000 mL/h | INTRAMUSCULAR | Status: DC | PRN
  Administered 2016-05-03 – 2016-05-04 (×3): 14 mL/h via EPIDURAL
  Filled 2016-05-03 (×4): qty 125

## 2016-05-03 MED ORDER — DIPHENHYDRAMINE HCL 50 MG/ML IJ SOLN: 12.5000 mg | INTRAMUSCULAR | Status: DC | PRN

## 2016-05-03 MED ORDER — ONDANSETRON HCL 4 MG/2ML IJ SOLN
4.0000 mg | Freq: Four times a day (QID) | INTRAMUSCULAR | Status: DC | PRN
  Administered 2016-05-03: 4 mg via INTRAVENOUS
  Filled 2016-05-03: qty 2

## 2016-05-03 MED ORDER — PENICILLIN G POTASSIUM 5000000 UNITS IJ SOLR
5.0000 10*6.[IU] | Freq: Once | INTRAVENOUS | Status: AC
  Administered 2016-05-03: 5 10*6.[IU] via INTRAVENOUS
  Filled 2016-05-03: qty 5

## 2016-05-03 MED ORDER — EPHEDRINE 5 MG/ML INJ
10.0000 mg | INTRAVENOUS | Status: DC | PRN
  Filled 2016-05-03: qty 2

## 2016-05-03 MED ORDER — PROMETHAZINE HCL 25 MG PO TABS
25.0000 mg | ORAL_TABLET | Freq: Four times a day (QID) | ORAL | Status: DC | PRN
  Administered 2016-05-03: 25 mg via ORAL
  Filled 2016-05-03: qty 1

## 2016-05-03 MED ORDER — OXYTOCIN 40 UNITS IN LACTATED RINGERS INFUSION - SIMPLE MED
INTRAVENOUS | Status: AC
  Filled 2016-05-03: qty 1000

## 2016-05-03 MED ORDER — LIDOCAINE HCL (PF) 1 % IJ SOLN
INTRAMUSCULAR | Status: DC | PRN
  Administered 2016-05-03: 5 mL
  Administered 2016-05-03: 3 mL
  Administered 2016-05-03: 2 mL

## 2016-05-03 MED ORDER — LACTATED RINGERS IV BOLUS (SEPSIS)
500.0000 mL | INTRAVENOUS | Status: DC | PRN
  Administered 2016-05-03: 500 mL via INTRAVENOUS
  Filled 2016-05-03: qty 500

## 2016-05-03 MED ORDER — NALBUPHINE HCL 10 MG/ML IJ SOLN
10.0000 mg | Freq: Four times a day (QID) | INTRAMUSCULAR | Status: DC | PRN
  Administered 2016-05-03: 10 mg via INTRAMUSCULAR
  Filled 2016-05-03: qty 1

## 2016-05-03 MED ORDER — PHENYLEPHRINE 40 MCG/ML (10ML) SYRINGE FOR IV PUSH (FOR BLOOD PRESSURE SUPPORT)
80.0000 ug | PREFILLED_SYRINGE | INTRAVENOUS | Status: DC | PRN
Start: 2016-05-03 — End: 2016-05-04
  Filled 2016-05-03: qty 10
  Filled 2016-05-03: qty 5

## 2016-05-03 MED ORDER — NALBUPHINE HCL 10 MG/ML IJ SOLN
10.0000 mg | INTRAMUSCULAR | Status: DC | PRN
Start: 2016-05-03 — End: 2016-05-04
  Administered 2016-05-03: 10 mg via INTRAVENOUS
  Filled 2016-05-03: qty 1

## 2016-05-03 MED ORDER — PHENYLEPHRINE 40 MCG/ML (10ML) SYRINGE FOR IV PUSH (FOR BLOOD PRESSURE SUPPORT)
80.0000 ug | PREFILLED_SYRINGE | INTRAVENOUS | Status: DC | PRN
  Filled 2016-05-03: qty 5

## 2016-05-03 MED ORDER — PENICILLIN G POTASSIUM 5000000 UNITS IJ SOLR
2.5000 10*6.[IU] | INTRAVENOUS | Status: DC
  Administered 2016-05-03 – 2016-05-04 (×8): 2.5 10*6.[IU] via INTRAVENOUS
  Filled 2016-05-03 (×13): qty 2.5

## 2016-05-03 MED ORDER — OXYTOCIN 40 UNITS IN LACTATED RINGERS INFUSION - SIMPLE MED
1.0000 m[IU]/min | INTRAVENOUS | Status: DC
  Administered 2016-05-03: 2 m[IU]/min via INTRAVENOUS

## 2016-05-03 NOTE — Anesthesia Preprocedure Evaluation (Addendum)
Anesthesia Evaluation  Patient identified by MRN, date of birth, ID band Patient awake    Reviewed: Allergy & Precautions, Patient's Chart, lab work & pertinent test results  Airway Mallampati: II       Dental   Pulmonary neg pulmonary ROS,    Pulmonary exam normal        Cardiovascular hypertension (GHTN), Normal cardiovascular exam     Neuro/Psych negative neurological ROS     GI/Hepatic negative GI ROS, Neg liver ROS,   Endo/Other    Renal/GU negative Renal ROS     Musculoskeletal   Abdominal   Peds  Hematology  (+) anemia ,   Anesthesia Other Findings   Reproductive/Obstetrics (+) Pregnancy (IOL for crohns flare and GHTN.)                             Lab Results  Component Value Date   WBC 8.7 05/03/2016   HGB 9.6* 05/03/2016   HCT 29.9* 05/03/2016   MCV 84.2 05/03/2016   PLT 214 05/03/2016   Lab Results  Component Value Date   CREATININE 0.41* 04/28/2016   BUN 9 04/28/2016   NA 136 04/28/2016   K 3.8 04/28/2016   CL 109 04/28/2016   CO2 18* 04/28/2016    Anesthesia Physical Anesthesia Plan  ASA: III  Anesthesia Plan: Epidural   Post-op Pain Management:    Induction:   Airway Management Planned: Natural Airway  Additional Equipment:   Intra-op Plan:   Post-operative Plan:   Informed Consent: I have reviewed the patients History and Physical, chart, labs and discussed the procedure including the risks, benefits and alternatives for the proposed anesthesia with the patient or authorized representative who has indicated his/her understanding and acceptance.     Plan Discussed with:   Anesthesia Plan Comments:        Anesthesia Quick Evaluation

## 2016-05-03 NOTE — H&P (Signed)
Jessica [name] is a 20 y.o. female presenting for IOL for Crohn's Disease exacerbation at [redacted] weeks gestation.. Maternal Medical History:  Reason for admission: H/O Crohn's Disease with exacerbation over the past week, off Humoral.  IOL recommended by 37-38 weeks by MFM.  Fetal activity: Perceived fetal activity is normal.   Last perceived fetal movement was within the past hour.    Prenatal complications: Crohn's Disease.  Prenatal Complications - Diabetes: none.    OB History    Gravida Para Term Preterm AB TAB SAB Ectopic Multiple Living   3 1 1  1  1   1      Past Medical History  Diagnosis Date  . Crohn's disease ([place])   . Anemia   . Osteoporosis    Past Surgical History  Procedure Laterality Date  . Small intestine surgery    . Incision and drainage Left     arm  . Dilation and evacuation N/A 10/31/2014    Procedure: DILATATION AND EVACUATION;  Surgeon: Lahoma Crocker, MD;  Location: [facility];  Service: Gynecology;  Laterality: N/A;  . Flexible sigmoidoscopy N/A 11/04/2014    Procedure: FLEXIBLE SIGMOIDOSCOPY;  Surgeon: [name], MD;  Location: [facility] ENDOSCOPY;  Service: Endoscopy;  Laterality: N/A;  . Ileostomy closure N/A 12/18/2014    Procedure: LOOP ILEOSTOMY REVERSAL;  Surgeon: [name], MD;  Location: [facility];  Service: General;  Laterality: N/A;  . Esophagogastroduodenoscopy N/A 01/22/2015    Procedure: ESOPHAGOGASTRODUODENOSCOPY (EGD);  Surgeon: [name], MD;  Location: [facility];  Service: Endoscopy;  Laterality: N/A;  . Colonoscopy N/A 01/22/2015    Procedure: COLONOSCOPY;  Surgeon: [name], MD;  Location: [facility];  Service: Endoscopy;  Laterality: N/A;   Family History: family history includes Diabetes in her father and maternal grandmother; Heart disease in her maternal grandmother; Hyperlipidemia in her maternal grandfather; Hypertension in her maternal grandmother; Kidney disease in her maternal grandmother; Lung cancer in her maternal  grandfather. There is no history of Stroke or Colon cancer. Social History:  reports that she has never smoked. She has never used smokeless tobacco. She reports that she does not drink alcohol or use illicit drugs.   Prenatal Transfer Tool  Maternal Diabetes: No Genetic Screening: Normal Maternal Ultrasounds/Referrals: Normal Fetal Ultrasounds or other Referrals:  Referred to Materal Fetal Medicine  Maternal Substance Abuse:  No Significant Maternal Medications:  Meds include: Other:  Significant Maternal Lab Results:  Lab values include: Group B Strep positive Other Comments:  None  Review of Systems  All other systems reviewed and are negative.   Dilation: 1.5 Effacement (%): Thick Station: -2 Exam by:: L.Stubbs, RN Blood pressure 117/81, pulse 86, temperature 98.2 F (36.8 C), temperature source Oral, resp. rate 16, height 5' 6"  (1.676 m), weight 200 lb (90.719 kg), last menstrual period 08/17/2015. Maternal Exam:  Abdomen: Patient reports no abdominal tenderness. Fetal presentation: vertex     Physical Exam  Nursing note and vitals reviewed. Constitutional: She is oriented to person, place, and time. She appears well-developed and well-nourished.  HENT:  Head: Normocephalic and atraumatic.  Eyes: Conjunctivae are normal. Pupils are equal, round, and reactive to light.  Neck: Normal range of motion. Neck supple.  Cardiovascular: Normal rate and regular rhythm.   Respiratory: Effort normal and breath sounds normal.  GI: Soft. Bowel sounds are normal.  Genitourinary: Vagina normal and uterus normal.  Musculoskeletal: Normal range of motion.  Neurological: She is alert and oriented to person, place, and time.  Skin: Skin is warm and dry.  Psychiatric: She has a normal mood and affect. Her behavior is normal. Judgment and thought content normal.    Prenatal labs: ABO, Rh: AB/POS/-- (12/30 1029) Antibody: NEG (12/30 1029) Rubella: 1.15 (12/30 1029) RPR: Non Reactive  (03/17 1126)  HBsAg: NEGATIVE (12/30 1029)  HIV: Non Reactive (03/17 1126)  GBS: Positive (05/03 1638)   Assessment/Plan: 37 weeks.  Crohn's Disease exacerbation.  2 stage IOL.   Hassel Uphoff A 05/03/2016, 5:00 AM

## 2016-05-03 NOTE — Anesthesia Pain Management Evaluation Note (Signed)
  CRNA Pain Management Visit Note  Patient: Jessica [name], 20 y.o., female  "Hello I am a member of the anesthesia team at [facility]. We have an anesthesia team available at all times to provide care throughout the hospital, including epidural management and anesthesia for C-section. I don't know your plan for the delivery whether it a natural birth, water birth, IV sedation, nitrous supplementation, doula or epidural, but we want to meet your pain goals."   1.Was your pain managed to your expectations on prior hospitalizations?   No prior   2.What is your expectation for pain management during this hospitalization?     Epidural  3.How can we help you reach that goal? epidural  Record the patient's initial score and the patient's pain goal.   Pain: 7  Pain Goal: 9 [facility] wants you to be able to say your pain was always managed very well.  Casimer Lanius 05/03/2016

## 2016-05-03 NOTE — Progress Notes (Signed)
Subjective:    Jessica [name] is a 20 y.o. female being seen today for her obstetrical visit. She is at 51w1dgestation. Patient reports increased pain. Fetal movement: normal.  Problem List Items Addressed This Visit    None    Visit Diagnoses    Prenatal care in third trimester    -  Primary    Relevant Orders    POCT Urinalysis Dipstick (Completed)      Patient Active Problem List   Diagnosis Date Noted  . Crohn's colitis ([place] 05/03/2016  . Supervision of high-risk pregnancy 12/18/2015  . Nausea and vomiting 01/22/2015  . [place]'s [place]itis ([place] 01/22/2015  . Diarrhea 01/22/2015  . Malnutrition of moderate degree ([place] 01/05/2015  . Nausea 01/03/2015  . 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 Absolute anemia   . CC (Crohn's colitis) ([place] 11/02/2014  . Rectal bleeding 11/02/2014  . Crohn disease ([place] 11/01/2014  . Missed abortion 10/31/2014  . Gestational hypertension w/o significant proteinuria in 3rd trimester 07/14/2013  . Supervision of normal first pregnancy 05/03/2013  . [place]'s disease ([place] 05/03/2013  . Ileostomy in place ([facility] 05/03/2013    Objective:    BP 135/95 mmHg  Pulse 96  Temp(Src) 98.7 F (37.1 C)  Wt 202 lb (91.627 kg)  LMP 08/17/2015 (Exact Date) FHT: 150 BPM  Uterine Size: size equals dates  Presentations: unsure    Assessment:    Pregnancy @ 361w1deeks   Plan:   Plans for delivery: Vaginal anticipated; labs reviewed; problem list updated Counseling: Consent signed. Infant feeding: plans to breastfeed. Cigarette smoking: never smoked. L&D discussion: symptoms of labor, discussed when to call, discussed what number to call, anesthetic/analgesic options reviewed and delivering clinician:  plans no preference. Postpartum supports and preparation: circumcision discussed and contraception plans discussed.

## 2016-05-03 NOTE — Progress Notes (Signed)
Jessica [name] is a 20 y.o. G3P1011 at 70w1dby LMP admitted for induction of labor due to Crohn's exacerbation.  Subjective:   Objective: BP 132/95 mmHg  Pulse 78  Temp(Src) 98.5 F (36.9 C) (Oral)  Resp 16  Ht 5' 6"  (1.676 m)  Wt 200 lb (90.719 kg)  BMI 32.30 kg/m2  LMP 08/17/2015 (Exact Date) I/O last 3 completed shifts: In: -  Out: 800 [Urine:800]    FHT:  FHR: 120 bpm, variability: moderate,  accelerations:  Present,  decelerations:  Absent UC:   regular, every 2-3 minutes SVE:   Dilation: 4 Effacement (%): 60 Station: -2 Exam by:: L.Stubbs, RN  Labs: Lab Results  Component Value Date   WBC 8.7 05/03/2016   HGB 9.6* 05/03/2016   HCT 29.9* 05/03/2016   MCV 84.2 05/03/2016   PLT 214 05/03/2016    Assessment / Plan: 37 weeks.  Crohn's exacerbation.  IOL.  Latent phase.  On pitocin for ~ 12 hours, not in active labor.  Will D/C pitocin for 2 hours, then restart pitocin per protocol.  Labor: Latent phase Preeclampsia:  n/a Fetal Wellbeing:  Category I Pain Control:  Epidural I/D:  n/a Anticipated MOD:  NSVD  Jessica [name] A 05/03/2016, 9:59 PM

## 2016-05-03 NOTE — Anesthesia Procedure Notes (Signed)
Epidural Patient location during procedure: OB  Staffing Anesthesiologist: Suzette Battiest Performed by: anesthesiologist   Preanesthetic Checklist Completed: patient identified, site marked, surgical consent, pre-op evaluation, timeout performed, IV checked, risks and benefits discussed and monitors and equipment checked  Epidural Patient position: sitting Prep: site prepped and draped and DuraPrep Patient monitoring: continuous pulse ox and blood pressure Approach: midline Location: L4-L5 Injection technique: LOR air  Needle:  Needle type: Tuohy  Needle gauge: 17 G Needle length: 9 cm and 9 Needle insertion depth: 7 cm Catheter type: closed end flexible Catheter size: 19 Gauge Catheter at skin depth: 14 (12cm initially. Advanced to 14cm when placed in right lat decub.) cm Test dose: negative  Assessment Events: blood not aspirated, injection not painful, no injection resistance, negative IV test and no paresthesia

## 2016-05-03 NOTE — Progress Notes (Signed)
Jessica [name] is a 20 y.o. G3P1011 at 54w1dby ultrasound admitted for induction of labor due to Hypertension and Crohn's flare.  Subjective: Is having cramping like pain.  Pain control options discussed.  Planning epidural  Objective: BP 128/94 mmHg  Pulse 85  Temp(Src) 36.5 C (97.7 F) (Oral)  Resp 18  Ht 167.6 cm  Wt 90719 g  BMI 32.30 kg/m2  LMP 08/17/2015 (Exact Date)      FHT:  FHR: 125 bpm, variability: moderate,  accelerations:  Present,  decelerations:  Absent UC:   irregular, every 3-6 minutes SVE:   Dilation: 2.5 Effacement (%): 60 Station: -2 Exam by:: LEvonnie Dawes rn  Labs: Lab Results  Component Value Date   WBC 9.1 05/03/2016   HGB 9.3* 05/03/2016   HCT 28.9* 05/03/2016   MCV 84.8 05/03/2016   PLT 238 05/03/2016    Assessment / Plan: Responded well to cytotec.  Staring pitocin.  Planning epidural.  IVP Nubain ordered for pain reliefe.    Labor: Progressing normally Preeclampsia:  no signs or symptoms of toxicity, intake and ouput balanced and labs stable Fetal Wellbeing:  Category I Pain Control:  IV pain meds I/D:  n/a Anticipated MOD:  NSVD  RMorene Crocker CNM 05/03/2016, 8:54 AM

## 2016-05-04 ENCOUNTER — Encounter (HOSPITAL_COMMUNITY): Payer: Self-pay

## 2016-05-04 ENCOUNTER — Encounter: Payer: Medicaid Other | Admitting: Certified Nurse Midwife

## 2016-05-04 MED ORDER — ACETAMINOPHEN 325 MG PO TABS
650.0000 mg | ORAL_TABLET | ORAL | Status: DC | PRN
  Administered 2016-05-06: 650 mg via ORAL
  Filled 2016-05-04: qty 2

## 2016-05-04 MED ORDER — PRENATAL MULTIVITAMIN CH
1.0000 | ORAL_TABLET | Freq: Every day | ORAL | Status: DC
  Administered 2016-05-05 – 2016-05-06 (×2): 1 via ORAL
  Filled 2016-05-04 (×2): qty 1

## 2016-05-04 MED ORDER — OXYCODONE-ACETAMINOPHEN 5-325 MG PO TABS
2.0000 | ORAL_TABLET | ORAL | Status: DC | PRN
  Administered 2016-05-04 – 2016-05-06 (×7): 2 via ORAL
  Filled 2016-05-04 (×6): qty 2

## 2016-05-04 MED ORDER — WITCH HAZEL-GLYCERIN EX PADS: 1.0000 "application " | MEDICATED_PAD | CUTANEOUS | Status: DC | PRN

## 2016-05-04 MED ORDER — ONDANSETRON HCL 4 MG/2ML IJ SOLN: 4.0000 mg | INTRAMUSCULAR | Status: DC | PRN

## 2016-05-04 MED ORDER — COCONUT OIL OIL: 1.0000 "application " | TOPICAL_OIL | Status: DC | PRN

## 2016-05-04 MED ORDER — IBUPROFEN 600 MG PO TABS
600.0000 mg | ORAL_TABLET | Freq: Four times a day (QID) | ORAL | Status: DC
  Filled 2016-05-04: qty 1

## 2016-05-04 MED ORDER — BENZOCAINE-MENTHOL 20-0.5 % EX AERO: 1.0000 "application " | INHALATION_SPRAY | CUTANEOUS | Status: DC | PRN

## 2016-05-04 MED ORDER — ONDANSETRON HCL 4 MG PO TABS: 4.0000 mg | ORAL_TABLET | ORAL | Status: DC | PRN

## 2016-05-04 MED ORDER — VITAFOL ULTRA 29-0.6-0.4-200 MG PO CAPS: 1.0000 | ORAL_CAPSULE | Freq: Every day | ORAL | Status: DC

## 2016-05-04 MED ORDER — SENNOSIDES-DOCUSATE SODIUM 8.6-50 MG PO TABS
2.0000 | ORAL_TABLET | ORAL | Status: DC
  Filled 2016-05-04 (×2): qty 2

## 2016-05-04 MED ORDER — MEDROXYPROGESTERONE ACETATE 150 MG/ML IM SUSP: 150.0000 mg | INTRAMUSCULAR | Status: DC | PRN

## 2016-05-04 MED ORDER — METHYLERGONOVINE MALEATE 0.2 MG PO TABS: 0.2000 mg | ORAL_TABLET | ORAL | Status: DC | PRN

## 2016-05-04 MED ORDER — MEASLES, MUMPS & RUBELLA VAC ~~LOC~~ INJ
0.5000 mL | INJECTION | Freq: Once | SUBCUTANEOUS | Status: DC
  Filled 2016-05-04: qty 0.5

## 2016-05-04 MED ORDER — ZOLPIDEM TARTRATE 5 MG PO TABS: 5.0000 mg | ORAL_TABLET | Freq: Every evening | ORAL | Status: DC | PRN

## 2016-05-04 MED ORDER — DIPHENHYDRAMINE HCL 25 MG PO CAPS
25.0000 mg | ORAL_CAPSULE | Freq: Four times a day (QID) | ORAL | Status: DC | PRN
  Administered 2016-05-04: 25 mg via ORAL
  Filled 2016-05-04: qty 1

## 2016-05-04 MED ORDER — OXYCODONE-ACETAMINOPHEN 5-325 MG PO TABS
1.0000 | ORAL_TABLET | ORAL | Status: DC | PRN
  Administered 2016-05-04: 1 via ORAL
  Filled 2016-05-04: qty 1

## 2016-05-04 MED ORDER — METHYLERGONOVINE MALEATE 0.2 MG/ML IJ SOLN: 0.2000 mg | INTRAMUSCULAR | Status: DC | PRN

## 2016-05-04 MED ORDER — BISACODYL 10 MG RE SUPP: 10.0000 mg | Freq: Every day | RECTAL | Status: DC | PRN

## 2016-05-04 MED ORDER — FLEET ENEMA 7-19 GM/118ML RE ENEM: 1.0000 | ENEMA | Freq: Every day | RECTAL | Status: DC | PRN

## 2016-05-04 MED ORDER — FERROUS SULFATE 325 (65 FE) MG PO TABS
325.0000 mg | ORAL_TABLET | Freq: Two times a day (BID) | ORAL | Status: DC
  Administered 2016-05-04 – 2016-05-06 (×4): 325 mg via ORAL
  Filled 2016-05-04 (×4): qty 1

## 2016-05-04 MED ORDER — DIBUCAINE 1 % RE OINT: 1.0000 "application " | TOPICAL_OINTMENT | RECTAL | Status: DC | PRN

## 2016-05-04 MED ORDER — OXYTOCIN 40 UNITS IN LACTATED RINGERS INFUSION - SIMPLE MED: 2.5000 [IU]/h | INTRAVENOUS | Status: AC

## 2016-05-04 MED ORDER — TETANUS-DIPHTH-ACELL PERTUSSIS 5-2.5-18.5 LF-MCG/0.5 IM SUSP
0.5000 mL | Freq: Once | INTRAMUSCULAR | Status: AC
  Administered 2016-05-05: 0.5 mL via INTRAMUSCULAR
  Filled 2016-05-04: qty 0.5

## 2016-05-04 MED ORDER — SIMETHICONE 80 MG PO CHEW: 80.0000 mg | CHEWABLE_TABLET | ORAL | Status: DC | PRN

## 2016-05-04 NOTE — Anesthesia Postprocedure Evaluation (Signed)
Anesthesia Post Note  Patient: Jessica [name]  Procedure(s) Performed: * No procedures listed *  Patient location during evaluation: Mother Baby Anesthesia Type: Epidural Level of consciousness: awake and alert and oriented Pain management: satisfactory to patient Vital Signs Assessment: post-procedure vital signs reviewed and stable Respiratory status: spontaneous breathing and nonlabored ventilation Cardiovascular status: stable Postop Assessment: no headache, no backache, no signs of nausea or vomiting, adequate PO intake and patient able to bend at knees (patient up walking) Anesthetic complications: no     Last Vitals:  Filed Vitals:   05/04/16 1338 05/04/16 1500  BP: 125/87 134/94  Pulse: 75 73  Temp: 36.7 C 36.6 C  Resp: 18 18    Last Pain:  Filed Vitals:   05/04/16 1741  PainSc: 6    Pain Goal: Patients Stated Pain Goal: 5 (05/04/16 0955)               Willa Rough

## 2016-05-04 NOTE — Progress Notes (Signed)
Jessica [name] is a 20 y.o. T4M2194 at 58w2dby LMP admitted for induction of labor due to Crohn's exaxerbation.  Subjective:   Objective: BP 125/87 mmHg  Pulse 75  Temp(Src) 98.1 F (36.7 C) (Oral)  Resp 18  Ht 5' 6"  (1.676 m)  Wt 200 lb (90.719 kg)  BMI 32.30 kg/m2  LMP 08/17/2015 (Exact Date)  Breastfeeding? Unknown I/O last 3 completed shifts: In: -  Out: 2850 [Urine:2850] Total I/O In: -  Out: 17125[Urine:1150; Blood:400]  FHT:  FHR: 150 bpm, variability: moderate,  accelerations:  Present,  decelerations:  Absent UC:   regular, every 2 minutes SVE:   Dilation: 10 Effacement (%): 80 Station: +2 Exam by:: k fields,. rn  Labs: Lab Results  Component Value Date   WBC 8.7 05/03/2016   HGB 9.6* 05/03/2016   HCT 29.9* 05/03/2016   MCV 84.2 05/03/2016   PLT 214 05/03/2016    Assessment / Plan: Induction of labor due to Crohn's exacerbation,  progressing well on pitocin  Labor: Progressing normally Preeclampsia:  n/a Fetal Wellbeing:  Category I Pain Control:  Epidural I/D:  n/a Anticipated MOD:  NSVD1  Amerah Puleo A 05/04/2016

## 2016-05-04 NOTE — Anesthesia Pain Management Evaluation Note (Signed)
  CRNA Pain Management Visit Note  Patient: Jessica [name], 20 y.o., female  "Hello I am a member of the anesthesia team at [facility]. We have an anesthesia team available at all times to provide care throughout the hospital, including epidural management and anesthesia for C-section. I don't know your plan for the delivery whether it a natural birth, water birth, IV sedation, nitrous supplementation, doula or epidural, but we want to meet your pain goals."   1.Was your pain managed to your expectations on prior hospitalizations?   No prior hospitalizations  2.What is your expectation for pain management during this hospitalization?     Epidural  3.How can we help you reach that goal? Epidural.  Record the patient's initial score and the patient's pain goal.   Pain: 5  Pain Goal: 5 [facility] wants you to be able to say your pain was always managed very well.  Linnell Swords L 05/04/2016

## 2016-05-05 LAB — CBC
HCT: 28.1 % — ABNORMAL LOW (ref 36.0–46.0)
Hemoglobin: 9.2 g/dL — ABNORMAL LOW (ref 12.0–15.0)
MCH: 27.5 pg (ref 26.0–34.0)
MCHC: 32.7 g/dL (ref 30.0–36.0)
MCV: 83.9 fL (ref 78.0–100.0)
Platelets: 209 10*3/uL (ref 150–400)
RBC: 3.35 MIL/uL — ABNORMAL LOW (ref 3.87–5.11)
RDW: 13.1 % (ref 11.5–15.5)
WBC: 8.7 10*3/uL (ref 4.0–10.5)

## 2016-05-05 MED ORDER — POLYSACCHARIDE IRON COMPLEX 150 MG PO CAPS
150.0000 mg | ORAL_CAPSULE | Freq: Every day | ORAL | Status: DC
  Administered 2016-05-06: 150 mg via ORAL
  Filled 2016-05-05: qty 1

## 2016-05-05 NOTE — Progress Notes (Signed)
Pt does not remember if she got tdap, not documented in prenatal care. Vaccination sheet given to pt

## 2016-05-05 NOTE — Lactation Note (Signed)
This note was copied from a baby's chart. Lactation Consultation Note  Patient Name: Jessica [name] EEATV'V Date: 05/05/2016 Reason for consult: Other (Comment) Baby at 30 hr of life and mom is formula feeding. She had questions about bf while taking Humira. She was getting injections during pregnancy for Crohn's Disease and plans to continue with the medication. Reviewed and given print outs from "Medications and Mother's Milk". She is aware of lactation services and support group.   Maternal Data    Feeding Feeding Type: Bottle Fed - Formula  LATCH Score/Interventions                      Lactation Tools Discussed/Used     Consult Status Consult Status: Complete    Denzil Hughes 05/05/2016, 5:42 PM

## 2016-05-05 NOTE — Progress Notes (Signed)
Post Partum Day #1 Subjective: no complaints, up ad lib, voiding and tolerating PO  Objective: Blood pressure 137/93, pulse 83, temperature 98 F (36.7 C), temperature source Oral, resp. rate 17, height 5' 6"  (1.676 m), weight 200 lb (90.719 kg), last menstrual period 08/17/2015, unknown if currently breastfeeding.  Physical Exam:  General: alert, cooperative and no distress Lochia: appropriate Uterine Fundus: firm Incision: none DVT Evaluation: No evidence of DVT seen on physical exam. No cords or calf tenderness. No significant calf/ankle edema.   Recent Labs  05/03/16 1018 05/05/16 0537  HGB 9.6* 9.2*  HCT 29.9* 28.1*    Assessment/Plan: Plan for discharge tomorrow, Breastfeeding and Lactation consult  Anemia   LOS: 2 days   Myrle Wanek A Seaton Hofmann 05/05/2016, 9:07 AM

## 2016-05-06 MED ORDER — IBUPROFEN 600 MG PO TABS: 600.0000 mg | ORAL_TABLET | Freq: Four times a day (QID) | ORAL | Status: DC

## 2016-05-06 MED ORDER — COCONUT OIL OIL: 1.0000 "application " | TOPICAL_OIL | Status: DC | PRN

## 2016-05-06 MED ORDER — OXYCODONE-ACETAMINOPHEN 5-325 MG PO TABS: 1.0000 | ORAL_TABLET | ORAL | Status: DC | PRN

## 2016-05-06 MED ORDER — SENNOSIDES-DOCUSATE SODIUM 8.6-50 MG PO TABS: 2.0000 | ORAL_TABLET | Freq: Two times a day (BID) | ORAL | Status: DC

## 2016-05-06 MED ORDER — POLYSACCHARIDE IRON COMPLEX 150 MG PO CAPS: 150.0000 mg | ORAL_CAPSULE | Freq: Every day | ORAL | Status: DC

## 2016-05-06 NOTE — Discharge Summary (Signed)
Obstetric Discharge Summary Reason for Admission: induction of labor Prenatal Procedures: ultrasound Intrapartum Procedures: spontaneous vaginal delivery Postpartum Procedures: none Complications-Operative and Postpartum: none HEMOGLOBIN  Date Value Ref Range Status  05/05/2016 9.2* 12.0 - 15.0 g/dL Final  01/23/2013 11.0 g/dL Final   HCT  Date Value Ref Range Status  05/05/2016 28.1* 36.0 - 46.0 % Final  01/23/2013 33 % Final   HEMATOCRIT  Date Value Ref Range Status  03/04/2016 33.7* 34.0 - 46.6 % Final    Physical Exam:  General: alert, cooperative and no distress Lochia: appropriate Uterine Fundus: firm Incision: none DVT Evaluation: No evidence of DVT seen on physical exam. No cords or calf tenderness. No significant calf/ankle edema.  Discharge Diagnoses: Term Pregnancy-delivered  Discharge Information: Date: 05/06/2016 Activity: pelvic rest Diet: routine Medications: PNV, Ibuprofen, Colace, Iron and Percocet Condition: stable Instructions: refer to practice specific booklet Discharge to: home   Newborn Data: Live born female  Birth Weight: 6 lb 10.4 oz (3015 g) APGAR: 9, 9  Home with mother.  Morene Crocker, CNM  05/06/2016, 8:19 AM

## 2016-05-09 ENCOUNTER — Telehealth: Payer: Self-pay | Admitting: Gastroenterology

## 2016-05-09 NOTE — Telephone Encounter (Signed)
Spoke with patient and she delivered on 05/04/16. She is calling because she is having RLQ pain and diarrhea 1/day. She states she has had this pain before delivery. Reports some blood in stool last week but none at this time. She denies fever or nausea. Last dose of Humira was 3 weeks ago. She is asking if she should restart Humira and if she needs OV now. Please, advise.

## 2016-05-09 NOTE — Telephone Encounter (Signed)
Patient given recommendations. 

## 2016-05-09 NOTE — Telephone Encounter (Signed)
If okay with her OBGYN, if no infection post-partum, she can resume the Humira at this time and see how she responds. Please remind her that her child cannot have live virus vaccines at this time, we discussed this previously at her clinic visit. It is safe to breast feed on Humira. If no improvement with Humira she should contact us. Thanks

## 2016-05-18 ENCOUNTER — Ambulatory Visit (INDEPENDENT_AMBULATORY_CARE_PROVIDER_SITE_OTHER): Payer: Medicaid Other | Admitting: Certified Nurse Midwife

## 2016-05-18 ENCOUNTER — Encounter: Payer: Self-pay | Admitting: Certified Nurse Midwife

## 2016-05-18 DIAGNOSIS — Z30013 Encounter for initial prescription of injectable contraceptive: Secondary | ICD-10-CM

## 2016-05-18 MED ORDER — MEDROXYPROGESTERONE ACETATE 150 MG/ML IM SUSP: 150.0000 mg | INTRAMUSCULAR | Status: DC

## 2016-05-18 NOTE — Progress Notes (Signed)
Patient ID: Jessica [name], female   DOB: [birth year], 20 y.o.   MRN: [id number]  Subjective:     Jessica [name] is a 20 y.o. female who presents for a postpartum visit. She is 2 weeks postpartum following a spontaneous vaginal delivery. I have fully reviewed the prenatal and intrapartum course. The delivery was at 64 gestational weeks. Outcome: spontaneous vaginal delivery. Anesthesia: epidural. Postpartum course has been complicated by her Crohn's disease. Baby's course has been normal. Baby is feeding by bottle - Similac Advance. Bleeding thin lochia and brown. Bowel function is normal. Bladder function is normal. Patient is not sexually active. Contraception method is abstinence. Postpartum depression screening: negative.  Tobacco, alcohol and substance abuse history reviewed.  Adult immunizations reviewed including TDAP, rubella and varicella.  Started back on Humira 3 days ago.    The following portions of the patient's history were reviewed and updated as appropriate: allergies, current medications, past family history, past medical history, past social history, past surgical history and problem list.  Review of Systems Pertinent items noted in HPI and remainder of comprehensive ROS otherwise negative.   Objective:    BP 121/86 mmHg  Pulse 91  Wt 179 lb (81.194 kg)  LMP 08/17/2015 (Exact Date)  General:  alert and cooperative   Breasts:  inspection negative, no nipple discharge or bleeding, no masses or nodularity palpable  Lungs: clear to auscultation bilaterally  Heart:  regular rate and rhythm, S1, S2 normal, no murmur, click, rub or gallop  Abdomen: soft, non-tender; bowel sounds normal; no masses,  no organomegaly   Vulva:  normal  Vagina: not evaluated  Cervix:  not evaluated  Corpus: normal  Adnexa:  not evaluated  Rectal Exam: Not performed.          50% of 15 min visit spent on counseling and coordination of care.  Assessment:     Normal 2 week postpartum exam. Pap  smear not done at today's visit.    Crohn's disease: on Humira  Plan:    1. Contraception: abstinence 2.  Starting on Depo injections 3. Follow up in: 4 weeks or as needed.  2hr GTT for h/o GDM/screening for DM q 3 yrs per ADA recommendations Preconception counseling provided Healthy lifestyle practices reviewed

## 2016-06-07 ENCOUNTER — Other Ambulatory Visit: Payer: Self-pay | Admitting: Gastroenterology

## 2016-06-08 ENCOUNTER — Ambulatory Visit: Payer: Medicaid Other | Admitting: Certified Nurse Midwife

## 2016-06-15 ENCOUNTER — Ambulatory Visit: Payer: Medicaid Other | Admitting: Certified Nurse Midwife

## 2016-07-20 ENCOUNTER — Telehealth: Payer: Self-pay | Admitting: Gastroenterology

## 2016-07-20 DIAGNOSIS — R1084 Generalized abdominal pain: Secondary | ICD-10-CM

## 2016-07-20 NOTE — Telephone Encounter (Signed)
Patient states she has had abdominal pain x 2 weeks. She had been taking Tylenol and Humira. States it is not getting better and now she has blood in stool. She is not breastfeeding at this time. Please, advise.

## 2016-07-20 NOTE — Telephone Encounter (Signed)
Left a message for patient to call back. 

## 2016-07-20 NOTE — Telephone Encounter (Signed)
Can you please have her go to the lab for CBC, CMP, ESR, CRP please. 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 When is she next due for her Humira? I would also like to obtain Humira levels the day she is due for her next dose (prior to her receiving the dose) if possible. If she can get labs tomorrow I can provide further recommendations when we get the results. Thanks

## 2016-07-21 NOTE — Telephone Encounter (Signed)
Patient notified of recommendations.  She is due for Humira next week.  She will come for other labs today. She understands that she may need labs prior to next Humira dose based on today's results.

## 2016-07-21 NOTE — Telephone Encounter (Signed)
Left message for patient to call back New lab orders entered

## 2016-09-22 ENCOUNTER — Ambulatory Visit: Payer: Medicaid Other | Admitting: Gastroenterology

## 2016-10-05 ENCOUNTER — Other Ambulatory Visit: Payer: Self-pay

## 2016-10-05 ENCOUNTER — Other Ambulatory Visit: Payer: Self-pay | Admitting: Gastroenterology

## 2016-10-05 DIAGNOSIS — K50918 Crohn's disease, unspecified, with other complication: Secondary | ICD-10-CM

## 2016-10-05 MED ORDER — HUMIRA PEN 40 MG/0.8ML ~~LOC~~ PNKT: 40.0000 mg | PEN_INJECTOR | SUBCUTANEOUS | 3 refills | Status: DC

## 2016-10-05 NOTE — Telephone Encounter (Signed)
Dr Teena Irani, Is Humira ok to refill. Her last visit was in June 2017? Does she need labs?

## 2016-10-05 NOTE — Telephone Encounter (Signed)
Left message for pt to return call. Refilled Humira. Per Dr Havery Moros she needs to come in for labs. Orders in.

## 2016-10-05 NOTE — Telephone Encounter (Signed)
Yes you can refill it and she should have a follow up clinic appointment. Can you also have her get CBC, CMP to ensure stable. Thanks

## 2016-11-17 ENCOUNTER — Ambulatory Visit: Payer: Medicaid Other | Admitting: Gastroenterology

## 2017-01-11 ENCOUNTER — Ambulatory Visit (INDEPENDENT_AMBULATORY_CARE_PROVIDER_SITE_OTHER): Payer: Medicaid Other | Admitting: Gastroenterology

## 2017-01-11 ENCOUNTER — Encounter: Payer: Self-pay | Admitting: Gastroenterology

## 2017-01-11 ENCOUNTER — Other Ambulatory Visit (INDEPENDENT_AMBULATORY_CARE_PROVIDER_SITE_OTHER): Payer: Medicaid Other

## 2017-01-11 VITALS — BP 110/78 | HR 88 | Ht 66.75 in | Wt 161.4 lb

## 2017-01-11 DIAGNOSIS — K50819 Crohn's disease of both small and large intestine with unspecified complications: Secondary | ICD-10-CM

## 2017-01-11 DIAGNOSIS — Z79899 Other long term (current) drug therapy: Secondary | ICD-10-CM

## 2017-01-11 DIAGNOSIS — R1084 Generalized abdominal pain: Secondary | ICD-10-CM | POA: Diagnosis not present

## 2017-01-11 LAB — CBC WITH DIFFERENTIAL/PLATELET
Basophils Absolute: 0 10*3/uL (ref 0.0–0.1)
Basophils Relative: 0.6 % (ref 0.0–3.0)
Eosinophils Absolute: 0 10*3/uL (ref 0.0–0.7)
Eosinophils Relative: 0.6 % (ref 0.0–5.0)
HCT: 34.2 % — ABNORMAL LOW (ref 36.0–46.0)
Hemoglobin: 10.8 g/dL — ABNORMAL LOW (ref 12.0–15.0)
Lymphocytes Relative: 29.7 % (ref 12.0–46.0)
Lymphs Abs: 1.9 10*3/uL (ref 0.7–4.0)
MCHC: 31.7 g/dL (ref 30.0–36.0)
MCV: 76.9 fl — ABNORMAL LOW (ref 78.0–100.0)
Monocytes Absolute: 0.4 10*3/uL (ref 0.1–1.0)
Monocytes Relative: 5.8 % (ref 3.0–12.0)
Neutro Abs: 4.1 10*3/uL (ref 1.4–7.7)
Neutrophils Relative %: 63.3 % (ref 43.0–77.0)
Platelets: 332 10*3/uL (ref 150.0–400.0)
RBC: 4.45 Mil/uL (ref 3.87–5.11)
RDW: 16.4 % — ABNORMAL HIGH (ref 11.5–15.5)
WBC: 6.5 10*3/uL (ref 4.0–10.5)

## 2017-01-11 LAB — COMPREHENSIVE METABOLIC PANEL
ALT: 11 U/L (ref 0–35)
AST: 13 U/L (ref 0–37)
Albumin: 4.5 g/dL (ref 3.5–5.2)
Alkaline Phosphatase: 82 U/L (ref 39–117)
BUN: 10 mg/dL (ref 6–23)
CO2: 27 mEq/L (ref 19–32)
Calcium: 9.4 mg/dL (ref 8.4–10.5)
Chloride: 105 mEq/L (ref 96–112)
Creatinine, Ser: 0.62 mg/dL (ref 0.40–1.20)
GFR: 154.48 mL/min (ref >60.00)
Glucose, Bld: 89 mg/dL (ref 70–99)
Potassium: 3.8 mEq/L (ref 3.5–5.1)
Sodium: 138 mEq/L (ref 135–145)
Total Bilirubin: 0.4 mg/dL (ref 0.2–1.2)
Total Protein: 8.4 g/dL — ABNORMAL HIGH (ref 6.0–8.3)

## 2017-01-11 LAB — SEDIMENTATION RATE: Sed Rate: 46 mm/hr — ABNORMAL HIGH (ref 0–20)

## 2017-01-11 LAB — VITAMIN D 25 HYDROXY (VIT D DEFICIENCY, FRACTURES): VITD: 9.97 ng/mL — ABNORMAL LOW (ref 30.00–100.00)

## 2017-01-11 LAB — C-REACTIVE PROTEIN: CRP: 0.1 mg/dL — ABNORMAL LOW (ref 0.5–20.0)

## 2017-01-11 LAB — HIGH SENSITIVITY CRP: CRP, High Sensitivity: 0.49 mg/L (ref 0.000–5.000)

## 2017-01-11 MED ORDER — BUDESONIDE 3 MG PO CPEP: ORAL_CAPSULE | ORAL | 0 refills | Status: DC

## 2017-01-11 MED ORDER — DICYCLOMINE HCL 10 MG PO CAPS: 10.0000 mg | ORAL_CAPSULE | Freq: Three times a day (TID) | ORAL | 3 refills | Status: DC | PRN

## 2017-01-11 MED ORDER — BUDESONIDE 3 MG PO CPEP: 3.0000 mg | ORAL_CAPSULE | Freq: Every day | ORAL | 0 refills | Status: DC

## 2017-01-11 NOTE — Patient Instructions (Signed)
If you are age 22 or older, your body mass index should be between 23-30. Your Body mass index is 25.46 kg/m. If this is out of the aforementioned range listed, please consider follow up with your Primary Care Provider.  If you are age 74 or younger, your body mass index should be between 19-25. Your Body mass index is 25.46 kg/m. If this is out of the aformentioned range listed, please consider follow up with your Primary Care Provider.   We have sent the following medications to your pharmacy for you to pick up at your convenience:  Budesonide  Bentyl  Your physician has requested that you go to the basement for lab work before leaving today.  You have been scheduled for an MRI at [facility]  on Monday January 29th. Your appointment time is 3:00. Please arrive 1.5 hours prior to your appointment time for registration purposes. Please make certain not to have anything to eat or drink 4 hours prior to your test. In addition, if you have any metal in your body, have a pacemaker or defibrillator, please be sure to let your ordering physician know. This test typically takes 45 minutes to 1 hour to complete.  Please come in next Tuesday and go down to the lab for evaluation of your Humira Level.  Thank you.

## 2017-01-11 NOTE — Addendum Note (Signed)
Addended by: Alphonzo Dublin on: 01/11/2017 04:14 PM   Modules accepted: Orders

## 2017-01-11 NOTE — Progress Notes (Signed)
HPI :  Crohn's history History of Crohns diagnosed in 2012. Ileocolonic disease. She had a surgery with ileal resection with divertiing ileostomy at age 21 for emergency surgery, she thinks due to a perforation although details of this and indication for it are unclear. She was hospitalized for diversion colitis in 2015. Ileostomy takedown was done in 2016. She had a post-operative colonoscopy in February 2016 showing mildly active disease, and was placed on Humira in March 2016. She has been on prednisone and budesonide prior Humira, She has never been on Remicade, no prior thiopurines or methotrexate. She has no history of perianal disease. No history of IPAA.   Colonoscopy 2/16 - patent anastomosis with some inflammatory changes, ileum with some ulcerations EGD 2/16 - normal  SINCE LAST VISIT:  I have not seen the patient in about a year. Patient was 4.5 months pregnant the last time I saw her in January 2017. We had discussed recommendations for treatment of her Crohn's around the time of her pregnancy. She reported she delivered early, she had continued Humira up to about week 59. She resumed Humira postdelivery. She is taking Humira every 2 weeks.   She states she is doing "okay" but has generally remained symptomatic since her pregnancy. She has some frequent pains in her RLQ and some loose stools as well. She has pain most days of the week. She thinks it is present most of the time, but worse with eating. She is having around 1 BM per day, sometimes 2 BMs. She has had some rare blood in the stools. She is eating okay, no vomiting. Weight is stable. No tobacco use. No NSAIDs. She is not breast feeding. Symptoms ongoing for a while but persistent. She has declined vaccines previously.    Past Medical History:  Diagnosis Date  . Anemia   . Crohn's disease ([place])   . Osteoporosis      Past Surgical History:  Procedure Laterality Date  . COLONOSCOPY N/A 01/22/2015   Procedure:  COLONOSCOPY;  Surgeon: [name], MD;  Location: [facility];  Service: Endoscopy;  Laterality: N/A;  . DILATION AND EVACUATION N/A 10/31/2014   Procedure: DILATATION AND EVACUATION;  Surgeon: Lahoma Crocker, MD;  Location: [facility];  Service: Gynecology;  Laterality: N/A;  . ESOPHAGOGASTRODUODENOSCOPY N/A 01/22/2015   Procedure: ESOPHAGOGASTRODUODENOSCOPY (EGD);  Surgeon: [name], MD;  Location: [facility];  Service: Endoscopy;  Laterality: N/A;  . FLEXIBLE SIGMOIDOSCOPY N/A 11/04/2014   Procedure: FLEXIBLE SIGMOIDOSCOPY;  Surgeon: [name], MD;  Location: [facility];  Service: Endoscopy;  Laterality: N/A;  . ILEOSTOMY CLOSURE N/A 12/18/2014   Procedure: LOOP ILEOSTOMY REVERSAL;  Surgeon: [name], MD;  Location: [facility];  Service: General;  Laterality: N/A;  . INCISION AND DRAINAGE Left    arm  . SMALL INTESTINE SURGERY     Family History  Problem Relation Age of Onset  . Diabetes Father   . Diabetes Maternal Grandmother   . Heart disease Maternal Grandmother   . Hypertension Maternal Grandmother   . Kidney disease Maternal Grandmother   . Lung cancer Maternal Grandfather   . Hyperlipidemia Maternal Grandfather   . Stroke Neg Hx   . Colon cancer Neg Hx    Social History  Substance Use Topics  . Smoking status: Never Smoker  . Smokeless tobacco: Never Used  . Alcohol use No   Current Outpatient Prescriptions  Medication Sig Dispense Refill  . HUMIRA PEN 40 MG/0.8ML PNKT Inject 40 mg as directed  every 14 (fourteen) days. 2 each 3  . iron polysaccharides (NIFEREX) 150 MG capsule Take 1 capsule (150 mg total) by mouth daily. 30 capsule 4   No current facility-administered medications for this visit.    No Known Allergies   Review of Systems: All systems reviewed and negative except where noted in HPI.   Lab Results  Component Value Date   WBC 8.7 05/05/2016   HGB 9.2 (L) 05/05/2016   HCT 28.1 (L) 05/05/2016   MCV 83.9 05/05/2016   PLT 209  05/05/2016    Lab Results  Component Value Date   CREATININE 0.41 (L) 04/28/2016   BUN 9 04/28/2016   NA 136 04/28/2016   K 3.8 04/28/2016   CL 109 04/28/2016   CO2 18 (L) 04/28/2016    Lab Results  Component Value Date   ALT 12 (L) 04/28/2016   AST 20 04/28/2016   ALKPHOS 137 (H) 04/28/2016   BILITOT 0.3 04/28/2016    Lab Results  Component Value Date   INR 1.05 11/02/2014   INR 1.0 09/12/2007   Lab Results  Component Value Date   CRP 1.1 (H) 11/02/2014     Physical Exam: BP 110/78 (BP Location: Left Arm, Patient Position: Sitting, Cuff Size: Normal)   Pulse 88   Ht 5' 6.75" (1.695 m)   Wt 161 lb 6 oz (73.2 kg)   LMP 01/04/2017   Breastfeeding? No   BMI 25.46 kg/m  Constitutional: Pleasant,well-developed, female in no acute distress. HEENT: Normocephalic and atraumatic. Conjunctivae are normal. No scleral icterus. Neck supple.  Cardiovascular: Normal rate, regular rhythm.  Pulmonary/chest: Effort normal and breath sounds normal. No wheezing, rales or rhonchi. Abdominal: Soft, nondistended, mild RLQ TTP. There are no masses palpable. No hepatomegaly. Extremities: no edema Lymphadenopathy: No cervical adenopathy noted. Neurological: Alert and oriented to person place and time. Skin: Skin is warm and dry. No rashes noted. Psychiatric: Normal mood and affect. Behavior is normal.   ASSESSMENT AND PLAN: 21 year old female here for a follow-up visit for Crohn's disease.  History as outlined above, she has history of perforating ileocolonic Crohn's disease maintained on Humira since 2016. Since her last visit she delivered her child and has not felt very well since the end of her pregnancy with ongoing loose stools and right lower quadrant pain. I suspect her Crohn's is likely driving her symptoms. Recommending labs today to include CBC, CRP, CMET, as well as tuberculosis testing and vitamin D testing. Recommend we also check her Humira trough levels and check for  antibodies to Humira, this will be done next Tuesday prior to her next dose. Based on her Humira levels in antibody levels, and we'll determine if we continue Humira or change to another regimen.  In the interim I'm going to start her on budesonide 9 mg per day for 1 month, but the subsequent taper, as well as trial of Bentyl when necessary for her pain. To restage her disease will also obtain an MR enterography given history of small bowel disease, rule out stricture versus active inflammation. She has chronic symptoms and afebrile, I think it unlikely she's had recurrence of perforating disease. I'm recommending flu shot and pneumococcal vaccine for her, she continues to decline vaccines, understanding risks of being on immunosuppressive therapy and our recommendations.   I will relay results of labs and imaging studies when available. All questions answered  [place] Cellar, MD [facility] Gastroenterology Pager [phone]

## 2017-01-12 ENCOUNTER — Telehealth: Payer: Self-pay | Admitting: Gastroenterology

## 2017-01-12 ENCOUNTER — Other Ambulatory Visit: Payer: Self-pay

## 2017-01-12 ENCOUNTER — Telehealth: Payer: Self-pay

## 2017-01-12 MED ORDER — VITAMIN D (ERGOCALCIFEROL) 1.25 MG (50000 UNIT) PO CAPS: 50000.0000 [IU] | ORAL_CAPSULE | ORAL | 1 refills | Status: DC

## 2017-01-12 NOTE — Telephone Encounter (Signed)
Thanks for contacting her and the follow up

## 2017-01-12 NOTE — Telephone Encounter (Signed)
-----  Message from Manus Gunning, MD sent at 01/11/2017  2:32 PM EST ----- Caryl Pina can you please relay the following to this patient: - labs show mild anemia but improved from previous - ESR is elevated (marker of inflammation) but CRP normal - Vitamin D is extremely low  She will continue with course of medications as outlined in clinic (starting budesonide). I would also like to start her on vitamin D 50,000 units once weekly for 6 weeks, and then she should take 2000 IU daily thereafter. Can you please order her the vitamin D 50K unit pill.   Otherwise she will be contacted with results of Humira levels and MRI. Thanks

## 2017-01-12 NOTE — Telephone Encounter (Signed)
Pt informed of lab results. She is aware to begin taking 50,000IU of Vit D once weekly for 6 weeks and that Rx was sent to pharmacy. She was also made aware that after 6 weeks she should take 2000IU of Vit D daily and that she can get this OTC. Informed that we will contact her after her Humira level is drawn next Tues and after the MRI as well. Pt understood.

## 2017-01-13 ENCOUNTER — Other Ambulatory Visit: Payer: Self-pay

## 2017-01-13 LAB — QUANTIFERON TB GOLD ASSAY (BLOOD)
Interferon Gamma Release Assay: NEGATIVE
Mitogen-Nil: 4.59 IU/mL
Quantiferon Nil Value: 0.02 IU/mL
Quantiferon Tb Ag Minus Nil Value: 0 IU/mL

## 2017-01-13 MED ORDER — DICYCLOMINE HCL 10 MG PO CAPS: 10.0000 mg | ORAL_CAPSULE | Freq: Three times a day (TID) | ORAL | 3 refills | Status: DC | PRN

## 2017-01-13 MED ORDER — BUDESONIDE 3 MG PO CPEP: ORAL_CAPSULE | ORAL | 0 refills | Status: DC

## 2017-01-13 NOTE — Telephone Encounter (Signed)
Rx's resent.

## 2017-01-16 ENCOUNTER — Encounter (HOSPITAL_COMMUNITY): Payer: Self-pay | Admitting: Radiology

## 2017-01-16 ENCOUNTER — Ambulatory Visit (HOSPITAL_COMMUNITY)
Admission: RE | Admit: 2017-01-16 | Discharge: 2017-01-16 | Disposition: A | Discharge status: Home / Self Care | Payer: Medicaid Other | Source: Ambulatory Visit | Attending: Gastroenterology | Admitting: Gastroenterology

## 2017-01-16 DIAGNOSIS — K50819 Crohn's disease of both small and large intestine with unspecified complications: Secondary | ICD-10-CM | POA: Insufficient documentation

## 2017-01-16 DIAGNOSIS — Z9889 Other specified postprocedural states: Secondary | ICD-10-CM | POA: Insufficient documentation

## 2017-01-16 DIAGNOSIS — Z79899 Other long term (current) drug therapy: Secondary | ICD-10-CM | POA: Diagnosis present

## 2017-01-16 MED ORDER — GADOBENATE DIMEGLUMINE 529 MG/ML IV SOLN
15.0000 mL | Freq: Once | INTRAVENOUS | Status: AC | PRN
  Administered 2017-01-16: 15 mL via INTRAVENOUS

## 2017-01-17 ENCOUNTER — Other Ambulatory Visit: Payer: Medicaid Other

## 2017-01-17 DIAGNOSIS — R1084 Generalized abdominal pain: Secondary | ICD-10-CM

## 2017-01-17 DIAGNOSIS — Z79899 Other long term (current) drug therapy: Secondary | ICD-10-CM

## 2017-01-17 DIAGNOSIS — K50819 Crohn's disease of both small and large intestine with unspecified complications: Secondary | ICD-10-CM

## 2017-01-20 ENCOUNTER — Telehealth: Payer: Self-pay

## 2017-01-20 ENCOUNTER — Telehealth: Payer: Self-pay | Admitting: Gastroenterology

## 2017-01-20 ENCOUNTER — Other Ambulatory Visit: Payer: Self-pay

## 2017-01-20 MED ORDER — HUMIRA PEN 40 MG/0.8ML ~~LOC~~ PNKT: 40.0000 mg | PEN_INJECTOR | SUBCUTANEOUS | 0 refills | Status: DC

## 2017-01-20 NOTE — Telephone Encounter (Signed)
Yes we are awaiting levels. In fact, it looks like her level returned and is low (1.1) but I am awaiting the antibody titer. If she has high antibody levels we may need to stop Humira. If she has no antibody levels then we will increase her Humira to once weekly. She should continue the budesonide for now, her last MR enterography looked good and I will let her know as soon as we hear back. If she has not heard from [place] by next week please ask her to call back. Thanks

## 2017-01-20 NOTE — Telephone Encounter (Signed)
I think the last result we were waiting on was the humira level. Please advise.

## 2017-01-20 NOTE — Telephone Encounter (Signed)
Refilled Humira x1

## 2017-01-20 NOTE — Telephone Encounter (Signed)
We can refill at present dose - but please give her one dose of 75m for now. Once her labs come back, we may either need to increase dose, or stop drug completely. She will be contacted with recommendations once antibody levels to Humira return

## 2017-01-20 NOTE — Telephone Encounter (Signed)
This pt is out of Humira refills. Can this be refilled. Almyra Free had mentioned that if lab values were ok you may change dosage. Does anything need to be changed at this time?

## 2017-01-22 LAB — ADALIMUMAB+AB (SERIAL MONITOR)
Adalimumab Drug Level: 1.1 ug/mL
Anti-Adalimumab Antibody: 79 ng/mL

## 2017-01-22 LAB — SERIAL MONITORING

## 2017-01-23 ENCOUNTER — Other Ambulatory Visit: Payer: Self-pay

## 2017-01-24 ENCOUNTER — Other Ambulatory Visit: Payer: Self-pay

## 2017-01-24 ENCOUNTER — Telehealth: Payer: Self-pay | Admitting: Gastroenterology

## 2017-01-24 DIAGNOSIS — K50819 Crohn's disease of both small and large intestine with unspecified complications: Secondary | ICD-10-CM

## 2017-01-24 MED ORDER — MERCAPTOPURINE 50 MG PO TABS: 50.0000 mg | ORAL_TABLET | Freq: Every day | ORAL | 2 refills | Status: DC

## 2017-01-25 ENCOUNTER — Telehealth: Payer: Self-pay

## 2017-01-25 ENCOUNTER — Other Ambulatory Visit: Payer: Self-pay

## 2017-01-25 NOTE — Telephone Encounter (Signed)
Prior auth with [place] [employer] started.

## 2017-01-25 NOTE — Telephone Encounter (Signed)
Started prior auth for Humira at [facility], #[id number].

## 2017-02-07 ENCOUNTER — Telehealth: Payer: Self-pay | Admitting: Gastroenterology

## 2017-02-07 NOTE — Telephone Encounter (Signed)
Left message for pt to return call. Clarify pharmacy and how does she normally use medication. Looks like one injection every 14 days.

## 2017-02-08 ENCOUNTER — Other Ambulatory Visit: Payer: Self-pay

## 2017-02-08 DIAGNOSIS — K50119 Crohn's disease of large intestine with unspecified complications: Secondary | ICD-10-CM

## 2017-02-08 MED ORDER — HUMIRA PEN 40 MG/0.8ML ~~LOC~~ PNKT: 40.0000 mg | PEN_INJECTOR | SUBCUTANEOUS | 2 refills | Status: DC

## 2017-02-08 NOTE — Telephone Encounter (Signed)
Confirmed with pt that she needs to inject Humira every 7 days per Dr. Havery Moros. Refills sent to [facility] in [place]. Recall placed for pt to return in 41mfor f/u and to return in 2w for labs (CBC, LFT).

## 2017-02-08 NOTE — Telephone Encounter (Signed)
Patient returned phone call after hours spoke with answering service. Best call back number is [phone].

## 2017-02-15 ENCOUNTER — Encounter: Payer: Self-pay | Admitting: Gastroenterology

## 2017-02-15 ENCOUNTER — Other Ambulatory Visit: Payer: Self-pay | Admitting: Gastroenterology

## 2017-02-27 ENCOUNTER — Telehealth: Payer: Self-pay | Admitting: Gastroenterology

## 2017-02-27 NOTE — Telephone Encounter (Signed)
Spoke to patient, let her know that she is due for lab work now. Orders are in [place]. Also, recall in system to call patient and schedule follow up in a couple of months. Patient states that her insurance would not cover the mercaptopurine this month. I asked her to call her insurance to see if they need a prior auth or she can schedule call pharmacy to see why it was denied, told her they usually will send [place] a prior auth notification. Told her to call back if she didn't find out anything.

## 2017-03-30 ENCOUNTER — Other Ambulatory Visit: Payer: Self-pay | Admitting: Gastroenterology

## 2017-03-30 DIAGNOSIS — K5090 Crohn's disease, unspecified, without complications: Secondary | ICD-10-CM

## 2017-03-30 MED ORDER — HUMIRA PEN 40 MG/0.8ML ~~LOC~~ PNKT: 40.0000 mg | PEN_INJECTOR | SUBCUTANEOUS | 2 refills | Status: DC

## 2017-03-30 MED ORDER — ADALIMUMAB 40 MG/0.8ML ~~LOC~~ AJKT: 40.0000 mg | AUTO-INJECTOR | SUBCUTANEOUS | 6 refills | Status: DC

## 2017-03-30 NOTE — Addendum Note (Signed)
Addended by: Rosanne Sack R on: 03/30/2017 04:51 PM   Modules accepted: Orders

## 2017-03-30 NOTE — Telephone Encounter (Signed)
Almyra Free yes you can refill the Humira. She had low levels of Humira on the last lab draw with development of antibodies and I had recommending starting 6MP to treat immunogenicity. Can you let me know if she started this? If so she is overdue for basic labs while on this regimen - CBC, LFTs. If not on 6MP, can you ask her why? I need to give her either thiopurines or methotrexate in addition to Humira if we are continuing her Humira given she has developed some antibodies to it, or we may consider changing her regimen and switching to something else.Thanks

## 2017-03-30 NOTE — Telephone Encounter (Signed)
Pt states she is taking the 22m. She will come to have CBC and LFT's checked. Humira refilled for pt.

## 2017-03-30 NOTE — Telephone Encounter (Signed)
Let me know if this is okay to refill, looks like she was taking it weekly. Thank you.

## 2017-04-05 ENCOUNTER — Encounter: Payer: Self-pay | Admitting: Gastroenterology

## 2017-04-06 ENCOUNTER — Other Ambulatory Visit: Payer: Self-pay

## 2017-04-06 ENCOUNTER — Telehealth: Payer: Self-pay | Admitting: Gastroenterology

## 2017-04-06 NOTE — Telephone Encounter (Signed)
Patient has not been taking her Humira for the last 2 weeks, she states due to insurance issues. I had received a prior approval on 01/25/17 for weekly Humira injections. She just got off the phone with her caseworker who was able to get the logistics worked out and patient will get her Humira dose tomorrow. She is due for lab work, but have asked her to wait 2 weeks after doing her weekly dosage of Humira, as this was what last OV note stated. CBC, LFT's are in Epic.

## 2017-04-06 NOTE — Telephone Encounter (Signed)
Okay thanks. Yes she should take Humira weekly and hopefully has resumed 6MP as well. Will await labs. thanks

## 2017-04-26 ENCOUNTER — Other Ambulatory Visit: Payer: Self-pay | Admitting: Gastroenterology

## 2017-04-26 NOTE — Telephone Encounter (Signed)
Jessica [name] this is not a chronic medication, I gave it to her in January with a slow taper, it is not a medication that is refilled. She needs a follow up appointment with me. If she is not feeling well please let me know and clarify what symptoms she is having. Thanks

## 2017-04-26 NOTE — Telephone Encounter (Signed)
Are you ok to refill?

## 2017-04-27 NOTE — Telephone Encounter (Signed)
Pt states that her pharmacy routinely calls her to see if she needs refills of her meds and this happened to be one of them. She c/o mild abdominal pain today. Overall feels that she does not need this medication. She continues Humira weekly and Mercaptopurine daily. Pt states that she is coming in tomorrow for CBC.

## 2017-04-27 NOTE — Telephone Encounter (Signed)
Okay sounds good, thanks for clarifying.

## 2017-04-27 NOTE — Telephone Encounter (Signed)
Left message for pt to return call.

## 2017-04-28 ENCOUNTER — Other Ambulatory Visit (INDEPENDENT_AMBULATORY_CARE_PROVIDER_SITE_OTHER): Payer: Medicaid Other

## 2017-04-28 DIAGNOSIS — K5090 Crohn's disease, unspecified, without complications: Secondary | ICD-10-CM | POA: Diagnosis not present

## 2017-04-28 LAB — CBC WITH DIFFERENTIAL/PLATELET
Basophils Absolute: 0 10*3/uL (ref 0.0–0.1)
Basophils Relative: 0.6 % (ref 0.0–3.0)
Eosinophils Absolute: 0.1 10*3/uL (ref 0.0–0.7)
Eosinophils Relative: 1 % (ref 0.0–5.0)
HCT: 35.4 % — ABNORMAL LOW (ref 36.0–46.0)
Hemoglobin: 11.1 g/dL — ABNORMAL LOW (ref 12.0–15.0)
Lymphocytes Relative: 39.1 % (ref 12.0–46.0)
Lymphs Abs: 2.6 10*3/uL (ref 0.7–4.0)
MCHC: 31.5 g/dL (ref 30.0–36.0)
MCV: 80.4 fl (ref 78.0–100.0)
Monocytes Absolute: 0.6 10*3/uL (ref 0.1–1.0)
Monocytes Relative: 8.6 % (ref 3.0–12.0)
Neutro Abs: 3.4 10*3/uL (ref 1.4–7.7)
Neutrophils Relative %: 50.7 % (ref 43.0–77.0)
Platelets: 318 10*3/uL (ref 150.0–400.0)
RBC: 4.4 Mil/uL (ref 3.87–5.11)
RDW: 15.6 % — ABNORMAL HIGH (ref 11.5–15.5)
WBC: 6.6 10*3/uL (ref 4.0–10.5)

## 2017-04-28 LAB — HEPATIC FUNCTION PANEL
ALT: 13 U/L (ref 0–35)
AST: 13 U/L (ref 0–37)
Albumin: 4.3 g/dL (ref 3.5–5.2)
Alkaline Phosphatase: 64 U/L (ref 39–117)
Bilirubin, Direct: 0 mg/dL (ref 0.0–0.3)
Total Bilirubin: 0.4 mg/dL (ref 0.2–1.2)
Total Protein: 8 g/dL (ref 6.0–8.3)

## 2017-05-29 ENCOUNTER — Other Ambulatory Visit: Payer: Self-pay | Admitting: Gastroenterology

## 2017-05-29 NOTE — Telephone Encounter (Signed)
Pt is requesting refill of Humira. Are you ok to refill?

## 2017-05-29 NOTE — Telephone Encounter (Signed)
Yes you can refill - 3 month supply with 3 refills

## 2017-07-20 ENCOUNTER — Encounter: Payer: Self-pay | Admitting: Gastroenterology

## 2017-09-21 ENCOUNTER — Ambulatory Visit (INDEPENDENT_AMBULATORY_CARE_PROVIDER_SITE_OTHER): Payer: Medicaid Other | Admitting: Gastroenterology

## 2017-09-21 ENCOUNTER — Encounter: Payer: Self-pay | Admitting: Gastroenterology

## 2017-09-21 VITALS — BP 104/86 | HR 88 | Ht 66.75 in | Wt 175.0 lb

## 2017-09-21 DIAGNOSIS — K50819 Crohn's disease of both small and large intestine with unspecified complications: Secondary | ICD-10-CM | POA: Diagnosis not present

## 2017-09-21 DIAGNOSIS — E559 Vitamin D deficiency, unspecified: Secondary | ICD-10-CM | POA: Diagnosis not present

## 2017-09-21 DIAGNOSIS — Z79899 Other long term (current) drug therapy: Secondary | ICD-10-CM | POA: Diagnosis not present

## 2017-09-21 MED ORDER — DICYCLOMINE HCL 10 MG PO CAPS: ORAL_CAPSULE | ORAL | 3 refills | Status: DC

## 2017-09-21 MED ORDER — ONDANSETRON 4 MG PO TBDP: ORAL_TABLET | ORAL | 3 refills | Status: DC

## 2017-09-21 NOTE — Progress Notes (Signed)
HPI :  Crohn's history History of Crohns diagnosed in 2012. Ileocolonic disease. She had a surgery with ileal resection with divertiing ileostomy at age 21 for emergency surgery, she thinks due to a perforation although details of this and indication for it are unclear. She was hospitalized for diversion colitis in 2015. Ileostomy takedown was done in 2016. She had a post-operative colonoscopy in February 2016 showing mildly active disease, and was placed on Humira in March 2016. She has been on prednisone and budesonide prior Humira, She has never been on Remicade, no prior thiopurines or methotrexate. She has no history of perianal disease.   Colonoscopy 2/16 - patent anastomosis with some inflammatory changes, ileum with some ulcerations EGD 2/16 - normal  SINCE LAST VISIT:  At the last visit the patient was having some symptoms for for Crohn's disease and was given a course of budesonide. MRE 01/16/17 - normal exam, no active inflammation. She declined vaccines at that time. We obtained Humira levels 01/17/17 - level of 1.1, with antibody level of 79. Recommended increasing Hun[place]ira to weekly and adding thiopurine - 6MP 6m once daily  Compliant with meds - she has been having some headaches and nausea which bothers herperiodically, and some diarrhea at times.She is having about 1-2 BMs per day. Loose stoolsusually. No blood in the stools.  She is having some pains she thinks mostly in her RLQ, she thinks it comes and goes.  Eating can sometimes precipitate her pain. She has some nausea, but no vomiting.  Weight is stable / steady. She thinks she feels about the same since her Humira dosing was changed and 6MP added. She took vitamin D for a period of time, but not taking any longer She denies NSAID use.  TB testing negative 01/11/17 She has declined flu shot / pneumovax Vitamin D level of 9.9 on 01/11/17    Past Medical History:  Diagnosis Date  . Anemia   . [place]     . Osteoporosis   . Vitamin D deficiency      Past Surgical History:  Procedure Laterality Date  . COLONOSCOPY N/A 01/22/2015   Procedure: COLONOSCOPY;  Surgeon: DLafayette Dragon MD;  Location: [facility];  Service: Endoscopy;  Laterality: N/A;  . DILATION AND EVACUATION N/A 10/31/2014   Procedure: DILATATION AND EVACUATION;  Surgeon: LLahoma Crocker MD;  [facility];  Service: Gynecology;  Laterality: N/A;  . ESOPHAGOGASTRODUODENOSCOPY N/A 01/22/2015   Procedure: ESOPHAGOGASTRODUODENOSCOPY (EGD);  Surgeon: DLafayette Dragon MD;  [facility];  Service: Endoscopy;  Laterality: N/A;  . FLEXIBLE SIGMOIDOSCOPY N/A 11/04/2014   Procedure: FLEXIBLE SIGMOIDOSCOPY;  Surgeon: DLafayette Dragon MD;  [facility];  Service: Endoscopy;  Laterality: N/A;  . ILEOSTOMY CLOSURE N/A 12/18/2014   Procedure: LOOP ILEOSTOMY REVERSAL;  Surgeon: ALeighton Ruff MD;  Location: [facility];  Service: General;  Laterality: N/A;  . INCISION AND DRAINAGE Left    arm  . SMALL INTESTINE SURGERY     Family History  Problem Relation Age of Onset  . Diabetes Father   . Diabetes Maternal Grandmother   . Heart disease Maternal Grandmother   . Hypertension Maternal Grandmother   . Kidney disease Maternal Grandmother   . Lung cancer Maternal Grandfather   . Hyperlipidemia Maternal Grandfather   . Stroke Neg Hx   . Colon cancer Neg Hx    Social History  Substance Use Topics  . Smoking status: Never Smoker  . Smokeless tobacco: Never Used  .  Alcohol use No   Current Outpatient Prescriptions  Medication Sig Dispense Refill  . HUMIRA PEN 40 MG/0.8ML PNKT INJECT 40 MG SUBCTANEOUSLY AS DIRECTED EVERY 7 (SEVEN) DAYS. 12 each 3  . mercaptopurine (PURINETHOL) 50 MG tablet Take 1 tablet (50 mg total) by mouth daily. Give on an empty stomach 1 hour before or 2 hours after meals. Caution: Chemotherapy. 30 tablet 2  . dicyclomine (BENTYL) 10 MG capsule Take 1 to 2 tablets every 8 hours as needed 30 capsule  3  . ondansetron (ZOFRAN ODT) 4 MG disintegrating tablet Take 1 to 2 tablets every 8 hours, as needed 30 tablet 3   No current facility-administered medications for this visit.    No Known Allergies   Review of Systems: All systems reviewed and negative except where noted in HPI.   Lab Results  Component Value Date   WBC 6.6 04/28/2017   HGB 11.1 (L) 04/28/2017   HCT 35.4 (L) 04/28/2017   MCV 80.4 04/28/2017   PLT 318.0 04/28/2017    Lab Results  Component Value Date   CREATININE 0.62 01/11/2017   BUN 10 01/11/2017   NA 138 01/11/2017   K 3.8 01/11/2017   CL 105 01/11/2017   CO2 27 01/11/2017    Lab Results  Component Value Date   ALT 13 04/28/2017   AST 13 04/28/2017   ALKPHOS 64 04/28/2017   BILITOT 0.4 04/28/2017     Physical Exam: BP 104/86 (BP Location: Left Arm, Patient Position: Sitting, Cuff Size: Normal)   Pulse 88   Ht 5' 6.75" (1.695 m)   Wt 175 lb (79.4 kg)   LMP 09/09/2017   Breastfeeding? No   BMI 27.61 kg/m  Constitutional: Pleasant,well-developed,female in no acute distress. HEENT: Normocephalic and atraumatic. Conjunctivae are normal. No scleral icterus. Neck supple.  Cardiovascular: Normal rate, regular rhythm.  Pulmonary/chest: Effort normal and breath sounds normal. No wheezing, rales or rhonchi. Abdominal: Soft, nondistended, mild RLQ TTP, There are no masses palpable. No hepatomegaly. Extremities: no edema Lymphadenopathy: No cervical adenopathy noted. Neurological: Alert and oriented to person place and time. Skin: Skin is warm and dry. No rashes noted. Psychiatric: Normal mood and affect. Behavior is normal.   ASSESSMENT AND PLAN: 21 year old female here for a follow-up visit for Crohn's disease. History as outlined above, she has history of perforating ileocolonic Crohn's disease maintained on Humira since 2016, history of small bowel resection.  While her MRE looked good in January, she continued to have abdominal pain and  some loose stools. Her Humira level was subtherapeutic with a mild/moderate antibody titer. Since that time her Humira has been increased to 40 mg once weekly and started on 6-MP once daily. She is tolerating this regimen however has not noticed a significant improvement in her symptoms. I think we should recheck her Humira trough levels and check her response to dose escalation and adding a thiopurine. If her levels remain subtherapeutic due to immunogenicity we may need to stop Humira and switch to an alternative regimen. If her levels are normal and the antibodies are no longer present, we should proceed with a colonoscopy to assess for active inflammation and stenosis at the surgical anastomosis. She agreed with this plan. We will plan on obtaining CBC, CMET, ESR, vitamin D level at that time as well. In the interim we'll give her Zofran and Bentyl to use as needed for nausea and cramps. We will discuss further plans moving forward once I have received Humira levels. Otherwise recommend at  least 2000 international units of vitamin D daily. I again recommended flu and pneumococcal vaccine which she declined. She should follow-up with her primary care for Pap smears. She agreed with the plan.   Ohiopyle Cellar, MD [facility] Gastroenterology Pager [phone]

## 2017-09-21 NOTE — Patient Instructions (Signed)
Your physician has requested that you go to the basement for lab work on Monday, 09-25-17.  We have sent the following medications to your pharmacy for you to pick up at your convenience: Bentyl Zofran  If you are age 23 or older, your body mass index should be between 23-30. Your Body mass index is 27.61 kg/m. If this is out of the aforementioned range listed, please consider follow up with your Primary Care Provider.  If you are age 66 or younger, your body mass index should be between 19-25. Your Body mass index is 27.61 kg/m. If this is out of the aformentioned range listed, please consider follow up with your Primary Care Provider.

## 2017-09-25 ENCOUNTER — Other Ambulatory Visit (INDEPENDENT_AMBULATORY_CARE_PROVIDER_SITE_OTHER): Payer: Medicaid Other

## 2017-09-25 DIAGNOSIS — K50819 Crohn's disease of both small and large intestine with unspecified complications: Secondary | ICD-10-CM

## 2017-09-25 LAB — CBC WITH DIFFERENTIAL/PLATELET
Basophils Absolute: 0 10*3/uL (ref 0.0–0.1)
Basophils Relative: 0.4 % (ref 0.0–3.0)
Eosinophils Absolute: 0.1 10*3/uL (ref 0.0–0.7)
Eosinophils Relative: 0.9 % (ref 0.0–5.0)
HCT: 36.3 % (ref 36.0–46.0)
Hemoglobin: 11.6 g/dL — ABNORMAL LOW (ref 12.0–15.0)
Lymphocytes Relative: 28.6 % (ref 12.0–46.0)
Lymphs Abs: 1.9 10*3/uL (ref 0.7–4.0)
MCHC: 31.9 g/dL (ref 30.0–36.0)
MCV: 83.6 fl (ref 78.0–100.0)
Monocytes Absolute: 0.5 10*3/uL (ref 0.1–1.0)
Monocytes Relative: 8.3 % (ref 3.0–12.0)
Neutro Abs: 4.1 10*3/uL (ref 1.4–7.7)
Neutrophils Relative %: 61.8 % (ref 43.0–77.0)
Platelets: 281 10*3/uL (ref 150.0–400.0)
RBC: 4.34 Mil/uL (ref 3.87–5.11)
RDW: 14.5 % (ref 11.5–15.5)
WBC: 6.6 10*3/uL (ref 4.0–10.5)

## 2017-09-25 LAB — COMPREHENSIVE METABOLIC PANEL
ALT: 12 U/L (ref 0–35)
AST: 12 U/L (ref 0–37)
Albumin: 3.9 g/dL (ref 3.5–5.2)
Alkaline Phosphatase: 62 U/L (ref 39–117)
BUN: 9 mg/dL (ref 6–23)
CO2: 25 mEq/L (ref 19–32)
Calcium: 9.2 mg/dL (ref 8.4–10.5)
Chloride: 105 mEq/L (ref 96–112)
Creatinine, Ser: 0.64 mg/dL (ref 0.40–1.20)
GFR: 147.99 mL/min (ref >60.00)
Glucose, Bld: 81 mg/dL (ref 70–99)
Potassium: 3.9 mEq/L (ref 3.5–5.1)
Sodium: 137 mEq/L (ref 135–145)
Total Bilirubin: 0.4 mg/dL (ref 0.2–1.2)
Total Protein: 7.7 g/dL (ref 6.0–8.3)

## 2017-09-25 LAB — VITAMIN D 25 HYDROXY (VIT D DEFICIENCY, FRACTURES): VITD: 15.01 ng/mL — ABNORMAL LOW (ref 30.00–100.00)

## 2017-09-25 LAB — SEDIMENTATION RATE: Sed Rate: 54 mm/hr — ABNORMAL HIGH (ref 0–20)

## 2017-09-26 ENCOUNTER — Other Ambulatory Visit: Payer: Self-pay

## 2017-09-26 MED ORDER — VITAMIN D (ERGOCALCIFEROL) 1.25 MG (50000 UNIT) PO CAPS: 50000.0000 [IU] | ORAL_CAPSULE | ORAL | 0 refills | Status: DC

## 2017-09-29 LAB — ADALIMUMAB+AB (SERIAL MONITOR)
Adalimumab Drug Level: 1.3 ug/mL
Anti-Adalimumab Antibody: <25 ng/mL

## 2017-09-29 LAB — SERIAL MONITORING

## 2017-10-03 ENCOUNTER — Other Ambulatory Visit: Payer: Self-pay

## 2017-10-05 ENCOUNTER — Other Ambulatory Visit: Payer: Self-pay

## 2017-10-05 ENCOUNTER — Telehealth: Payer: Self-pay

## 2017-10-05 DIAGNOSIS — K50919 Crohn's disease, unspecified, with unspecified complications: Secondary | ICD-10-CM

## 2017-10-05 NOTE — Telephone Encounter (Signed)
-----   Message from Darden Dates sent at 10/05/2017 12:36 PM EDT ----- Regarding: infusion No precert needed for Medicaid pt's on infusions.

## 2017-10-05 NOTE — Telephone Encounter (Signed)
Thanks Almyra Free, She just stopped Humira within the past week. Why don't we schedule for getting Entyvio within a few weeks. She should continue 6MP in the interim. Thanks much

## 2017-10-05 NOTE — Telephone Encounter (Signed)
Do you want me to start her ASAP? This will need to be done at the hospital since [place] does not take medicaid.

## 2017-10-09 ENCOUNTER — Other Ambulatory Visit: Payer: Self-pay

## 2017-10-09 DIAGNOSIS — K50919 Crohn's disease, unspecified, with unspecified complications: Secondary | ICD-10-CM

## 2017-10-09 NOTE — Telephone Encounter (Signed)
Spoke to patient, she will continue 6MP, confirmed she has stopped her Humira. She is scheduled for first Entyvio infusion on 10/30/17 at [facility] medical day at 9:00. Scheduled at 0,2,6 then every 8 weeks. Orders placed.

## 2017-10-30 ENCOUNTER — Telehealth: Payer: Self-pay

## 2017-10-30 ENCOUNTER — Ambulatory Visit (HOSPITAL_COMMUNITY)
Admission: RE | Admit: 2017-10-30 | Discharge: 2017-10-30 | Disposition: A | Discharge status: Home / Self Care | Payer: Medicaid Other | Source: Ambulatory Visit | Attending: Gastroenterology | Admitting: Gastroenterology

## 2017-10-30 DIAGNOSIS — K50919 Crohn's disease, unspecified, with unspecified complications: Secondary | ICD-10-CM | POA: Insufficient documentation

## 2017-10-30 MED ORDER — ACETAMINOPHEN 325 MG PO TABS
650.0000 mg | ORAL_TABLET | Freq: Once | ORAL | Status: AC
  Administered 2017-10-30: 650 mg via ORAL

## 2017-10-30 MED ORDER — ONDANSETRON HCL 4 MG/2ML IJ SOLN
4.0000 mg | Freq: Once | INTRAMUSCULAR | Status: AC
  Administered 2017-10-30: 4 mg via INTRAVENOUS

## 2017-10-30 MED ORDER — SODIUM CHLORIDE 0.9 % IV SOLN
Freq: Once | INTRAVENOUS | Status: AC
  Administered 2017-10-30: 11:00:00 via INTRAVENOUS

## 2017-10-30 MED ORDER — ACETAMINOPHEN 325 MG PO TABS
ORAL_TABLET | ORAL | Status: AC
  Filled 2017-10-30: qty 1

## 2017-10-30 MED ORDER — VEDOLIZUMAB 300 MG IV SOLR
300.0000 mg | INTRAVENOUS | Status: DC
  Administered 2017-10-30: 300 mg via INTRAVENOUS
  Filled 2017-10-30: qty 5

## 2017-10-30 MED ORDER — ONDANSETRON HCL 4 MG/2ML IJ SOLN
INTRAMUSCULAR | Status: AC
  Administered 2017-10-30: 4 mg via INTRAVENOUS
  Filled 2017-10-30: qty 2

## 2017-10-30 NOTE — Telephone Encounter (Signed)
Patient states she had not felt well this morning with headache, nausea. Patient finished NS fluids, and zofran. She is feeling better in regards to nausea, still has headache, pain was 9/10 now 8/10, vital signs WNL.  V.O. Dr. Havery Moros patient may be released and can take Tylenol 4 hours after last dose. Orders given to Kristen at [facility] medical day.

## 2017-10-30 NOTE — Telephone Encounter (Signed)
Jessica [name] called from [facility] medical day, patient finished Entyvio infusion had Tylenol for her headache, went to the bathroom and vomited. Patient reported not eating this morning as her stomach was upset. V.O. from Dr. Havery Moros to give 1 liter bag of NS and Zofran 4 mg IV, call back to our office if this does not help.

## 2017-10-30 NOTE — Progress Notes (Addendum)
Pt reports that nausea has improved and headache remains but has improved "a little" after Tylenol and IV fluid. Will call and inform Dr. Doyne Keel office before pt leaves hospital.

## 2017-10-30 NOTE — Progress Notes (Signed)
Pt's infusion is completed.  VSS, pt states she feels light headed and her headache is not any better.  Pt ambulated to the bathroom with assistance and stated she threw up while she was in the restroom. Pt stated she did not eat this morning because her stomach hurt. Called Dr Doyne Keel office to report the above.  Awaiting return call from MD.

## 2017-10-30 NOTE — Progress Notes (Signed)
Spoke with Gregary Signs at Dr Doyne Keel office.  Okay to DC patient.  Told her she could take 2 more tylenol at 1:30 today if needed for persistent headache and if she got worse to call Dr Doyne Keel office.  Pt verbalized understanding. Pt stated her nausea was better but her headache was still there and that she had woken up with it.  Pt DC home and VSS.

## 2017-10-30 NOTE — Progress Notes (Signed)
Gregary Signs returned call from Dr Doyne Keel office.  Orders received to give pt a liter of fluids and IV zofran

## 2017-10-30 NOTE — Telephone Encounter (Signed)
Hopefull she improves with conservative therapy. Sounds like she felt poorly prior to the infusion, not convinced this is an infusion reaction. If no improvement she should call back

## 2017-10-30 NOTE — Telephone Encounter (Signed)
Short stay called today, patient is there for Entyvio infusion, she has a headache, okayed to give Tylenol.

## 2017-11-13 ENCOUNTER — Ambulatory Visit (HOSPITAL_COMMUNITY)
Admission: RE | Admit: 2017-11-13 | Discharge: 2017-11-13 | Disposition: A | Discharge status: Home / Self Care | Payer: Medicaid Other | Source: Ambulatory Visit | Attending: Gastroenterology | Admitting: Gastroenterology

## 2017-11-13 DIAGNOSIS — K50919 Crohn's disease, unspecified, with unspecified complications: Secondary | ICD-10-CM | POA: Diagnosis not present

## 2017-11-13 MED ORDER — VEDOLIZUMAB 300 MG IV SOLR
300.0000 mg | INTRAVENOUS | Status: DC
  Administered 2017-11-13: 300 mg via INTRAVENOUS
  Filled 2017-11-13: qty 5

## 2017-12-07 ENCOUNTER — Telehealth: Payer: Self-pay | Admitting: Gastroenterology

## 2017-12-07 NOTE — Telephone Encounter (Signed)
Medicaid does not req precert. I have no idea what she is talking about.  I looked at her acct, pre-service center even has notes that infusion is approved.

## 2017-12-07 NOTE — Telephone Encounter (Signed)
Spoke to patient, she said that [facility] needed auth for next Entyvio infusion. I see an Amb Ref for the Entyvio. Do you know what they are talking about as this is usually covered from one date to end date.

## 2017-12-07 NOTE — Telephone Encounter (Signed)
Let patient know this information, patient also needed to call scheduling to set up her next infusion date. Given number to [facility] day, [phone].

## 2017-12-14 ENCOUNTER — Ambulatory Visit (HOSPITAL_COMMUNITY)
Admission: RE | Admit: 2017-12-14 | Discharge: 2017-12-14 | Disposition: A | Discharge status: Home / Self Care | Payer: Medicaid Other | Source: Ambulatory Visit | Attending: Gastroenterology | Admitting: Gastroenterology

## 2017-12-14 DIAGNOSIS — K50919 Crohn's disease, unspecified, with unspecified complications: Secondary | ICD-10-CM | POA: Diagnosis not present

## 2017-12-14 MED ORDER — VEDOLIZUMAB 300 MG IV SOLR
300.0000 mg | INTRAVENOUS | Status: DC
  Administered 2017-12-14: 300 mg via INTRAVENOUS
  Filled 2017-12-14: qty 5

## 2018-02-05 ENCOUNTER — Telehealth: Payer: Self-pay

## 2018-02-05 ENCOUNTER — Other Ambulatory Visit: Payer: Self-pay

## 2018-02-05 DIAGNOSIS — K50819 Crohn's disease of both small and large intestine with unspecified complications: Secondary | ICD-10-CM

## 2018-02-05 NOTE — Telephone Encounter (Signed)
Kristen from [facility] short stays states this pt is coming in tomorrow for [place], and they need orders put in.

## 2018-02-05 NOTE — Telephone Encounter (Signed)
Orders placed, also patient needs to have a quantiferon gold plus.

## 2018-02-06 ENCOUNTER — Ambulatory Visit (HOSPITAL_COMMUNITY)
Admission: RE | Admit: 2018-02-06 | Discharge: 2018-02-06 | Disposition: A | Discharge status: Home / Self Care | Payer: Medicaid Other | Source: Ambulatory Visit | Attending: Gastroenterology | Admitting: Gastroenterology

## 2018-02-06 DIAGNOSIS — K5090 Crohn's disease, unspecified, without complications: Secondary | ICD-10-CM | POA: Diagnosis not present

## 2018-02-06 DIAGNOSIS — K50819 Crohn's disease of both small and large intestine with unspecified complications: Secondary | ICD-10-CM

## 2018-02-06 MED ORDER — VEDOLIZUMAB 300 MG IV SOLR
300.0000 mg | Freq: Once | INTRAVENOUS | Status: AC
  Administered 2018-02-06: 300 mg via INTRAVENOUS
  Filled 2018-02-06: qty 5

## 2018-02-08 ENCOUNTER — Other Ambulatory Visit: Payer: Self-pay

## 2018-02-08 DIAGNOSIS — K50919 Crohn's disease, unspecified, with unspecified complications: Secondary | ICD-10-CM

## 2018-02-08 LAB — QUANTIFERON-TB GOLD PLUS (RQFGPL)
QuantiFERON Mitogen Value: >10 IU/mL
QuantiFERON Nil Value: 0.03 IU/mL
QuantiFERON TB1 Ag Value: 0.05 IU/mL
QuantiFERON TB2 Ag Value: 0.02 IU/mL

## 2018-02-08 LAB — QUANTIFERON-TB GOLD PLUS: QuantiFERON-TB Gold Plus: NEGATIVE

## 2018-02-08 NOTE — Progress Notes (Signed)
Labs due

## 2018-03-21 ENCOUNTER — Ambulatory Visit: Payer: Medicaid Other | Admitting: Gastroenterology

## 2018-03-21 ENCOUNTER — Encounter: Payer: Self-pay | Admitting: Gastroenterology

## 2018-03-21 ENCOUNTER — Encounter (INDEPENDENT_AMBULATORY_CARE_PROVIDER_SITE_OTHER): Payer: Self-pay

## 2018-03-21 ENCOUNTER — Other Ambulatory Visit: Payer: Self-pay

## 2018-03-21 ENCOUNTER — Other Ambulatory Visit (INDEPENDENT_AMBULATORY_CARE_PROVIDER_SITE_OTHER): Payer: Medicaid Other

## 2018-03-21 VITALS — BP 112/84 | HR 80 | Ht 66.75 in | Wt 184.2 lb

## 2018-03-21 DIAGNOSIS — R519 Headache, unspecified: Secondary | ICD-10-CM

## 2018-03-21 DIAGNOSIS — K50019 Crohn's disease of small intestine with unspecified complications: Secondary | ICD-10-CM | POA: Diagnosis not present

## 2018-03-21 DIAGNOSIS — K50819 Crohn's disease of both small and large intestine with unspecified complications: Secondary | ICD-10-CM

## 2018-03-21 DIAGNOSIS — R51 Headache: Secondary | ICD-10-CM

## 2018-03-21 LAB — CBC WITH DIFFERENTIAL/PLATELET
Basophils Absolute: 0 10*3/uL (ref 0.0–0.1)
Basophils Relative: 0.5 % (ref 0.0–3.0)
Eosinophils Absolute: 0.1 10*3/uL (ref 0.0–0.7)
Eosinophils Relative: 0.9 % (ref 0.0–5.0)
HCT: 36.7 % (ref 36.0–46.0)
Hemoglobin: 11.7 g/dL — ABNORMAL LOW (ref 12.0–15.0)
Lymphocytes Relative: 32.5 % (ref 12.0–46.0)
Lymphs Abs: 2.2 10*3/uL (ref 0.7–4.0)
MCHC: 31.8 g/dL (ref 30.0–36.0)
MCV: 83.5 fl (ref 78.0–100.0)
Monocytes Absolute: 0.6 10*3/uL (ref 0.1–1.0)
Monocytes Relative: 9.4 % (ref 3.0–12.0)
Neutro Abs: 3.9 10*3/uL (ref 1.4–7.7)
Neutrophils Relative %: 56.7 % (ref 43.0–77.0)
Platelets: 301 10*3/uL (ref 150.0–400.0)
RBC: 4.39 Mil/uL (ref 3.87–5.11)
RDW: 14.6 % (ref 11.5–15.5)
WBC: 6.9 10*3/uL (ref 4.0–10.5)

## 2018-03-21 LAB — COMPREHENSIVE METABOLIC PANEL
ALT: 16 U/L (ref 0–35)
AST: 17 U/L (ref 0–37)
Albumin: 3.9 g/dL (ref 3.5–5.2)
Alkaline Phosphatase: 71 U/L (ref 39–117)
BUN: 9 mg/dL (ref 6–23)
CO2: 26 mEq/L (ref 19–32)
Calcium: 9.3 mg/dL (ref 8.4–10.5)
Chloride: 104 mEq/L (ref 96–112)
Creatinine, Ser: 0.58 mg/dL (ref 0.40–1.20)
GFR: 165.09 mL/min (ref >60.00)
Glucose, Bld: 85 mg/dL (ref 70–99)
Potassium: 4.2 mEq/L (ref 3.5–5.1)
Sodium: 136 mEq/L (ref 135–145)
Total Bilirubin: 0.4 mg/dL (ref 0.2–1.2)
Total Protein: 7.9 g/dL (ref 6.0–8.3)

## 2018-03-21 LAB — VITAMIN D 25 HYDROXY (VIT D DEFICIENCY, FRACTURES): VITD: 6.84 ng/mL — ABNORMAL LOW (ref 30.00–100.00)

## 2018-03-21 MED ORDER — AMITRIPTYLINE HCL 10 MG PO TABS: ORAL_TABLET | ORAL | 1 refills | Status: DC

## 2018-03-21 MED ORDER — DICYCLOMINE HCL 10 MG PO CAPS: 10.0000 mg | ORAL_CAPSULE | Freq: Three times a day (TID) | ORAL | 3 refills | Status: DC | PRN

## 2018-03-21 MED ORDER — ONDANSETRON 4 MG PO TBDP: 4.0000 mg | ORAL_TABLET | Freq: Four times a day (QID) | ORAL | 3 refills | Status: DC | PRN

## 2018-03-21 MED ORDER — VITAMIN D (ERGOCALCIFEROL) 1.25 MG (50000 UNIT) PO CAPS: 50000.0000 [IU] | ORAL_CAPSULE | ORAL | 0 refills | Status: DC

## 2018-03-21 NOTE — Progress Notes (Signed)
HPI :  Crohn's history History of Crohns diagnosed in 2012. Ileocolonic disease. She had a surgery with ileal resection with divertiing ileostomy at age 22 for emergency surgery, she thinks due to a perforation although details of this and indication for it are unclear. She was hospitalized for diversion colitis in 2015. Ileostomy takedown was done in 2016. She had a post-operative colonoscopy in February 2016 showing mildly active disease, and was placed on Humira in March 2016. She developed antibody on this regimen, Humira was increased and started on 6MP. The antibody level went away but Humira level remained low despite optimal dosing. Started on [facility] November 2018.   Colonoscopy 2/16 - patent anastomosis with some inflammatory changes, ileum with some ulcerations EGD 2/16 - normal  SINCE LAST VISIT:  Previously she had some recurrent symptoms. We obtained Humira levels 01/17/17 - level of 1.1, with antibody level of 79. Recommended increasing Hunmira to weekly and adding thiopurine - 6MP 9m once daily. Her antibody level resolved however despite optimal dosing her level remained at 1.3. This was stopped and [facility].    In general she reports feeling much better on Entyvio since last time I seen her.  She reports she does not have much of any abdominal pain that bothers her.  She will occasionally has some cramping in her right lower quadrant with bowel movements but otherwise does not have problems with pain.  She continues to have some loose stools with some urgency at times, this is the only thing that really bothering her.  She denies any blood in her stools.  She does have some occasional nausea, no vomiting.  She has experienced headaches which have bothered her fairly frequently since starting Entyvio, she thinks this is related.  She is not losing any weight.  She has not continued 6-MP, is taking only Entyvio monotherapy.  She is due for her next  dose on April 16.  She is not taking any NSAIDs.,  Tylenol is not helping the headaches.  She has previously declined flu shot / pneumovax Vitamin D level of 9.9 on 01/11/17 TB testing negative 2/19     Past Medical History:  Diagnosis Date  . Anemia   . Crohn's disease ([place]   . Osteoporosis   . Vitamin D deficiency      Past Surgical History:  Procedure Laterality Date  . COLONOSCOPY N/A 01/22/2015   Procedure: COLONOSCOPY;  Surgeon: DLafayette Dragon MD;  [facility]: [facility];  Service: Endoscopy;  Laterality: N/A;  . DILATION AND EVACUATION N/A 10/31/2014   Procedure: DILATATION AND EVACUATION;  Surgeon: LLahoma Crocker MD;  [facility];  Service: Gynecology;  Laterality: N/A;  . ESOPHAGOGASTRODUODENOSCOPY N/A 01/22/2015   Procedure: ESOPHAGOGASTRODUODENOSCOPY (EGD);  Surgeon: DLafayette Dragon MD;  [facility];  Service: Endoscopy;  Laterality: N/A;  . FLEXIBLE SIGMOIDOSCOPY N/A 11/04/2014   Procedure: FLEXIBLE SIGMOIDOSCOPY;  Surgeon: DLafayette Dragon MD;  [facility];  Service: Endoscopy;  Laterality: N/A;  . ILEOSTOMY CLOSURE N/A 12/18/2014   Procedure: LOOP ILEOSTOMY REVERSAL;  Surgeon: ALeighton Ruff MD;  Location: [facility];  Service: General;  Laterality: N/A;  . INCISION AND DRAINAGE Left    arm  . SMALL INTESTINE SURGERY     Family History  Problem Relation Age of Onset  . Diabetes Father   . Diabetes Maternal Grandmother   . Heart disease Maternal Grandmother   . Hypertension Maternal Grandmother   . Kidney disease Maternal  Grandmother   . Lung cancer Maternal Grandfather   . Hyperlipidemia Maternal Grandfather   . Stroke Neg Hx   . Colon cancer Neg Hx    Social History   Tobacco Use  . Smoking status: Never Smoker  . Smokeless tobacco: Never Used  Substance Use Topics  . Alcohol use: No    Alcohol/week: 0.0 oz  . Drug use: No   Current Outpatient Medications  Medication Sig Dispense Refill  . vedolizumab (ENTYVIO) 300 MG  injection Inject into the vein every 6 (six) weeks.     No current facility-administered medications for this visit.    No Known Allergies   Review of Systems: All systems reviewed and negative except where noted in HPI.   Lab Results  Component Value Date   WBC 6.6 09/25/2017   HGB 11.6 (L) 09/25/2017   HCT 36.3 09/25/2017   MCV 83.6 09/25/2017   PLT 281.0 09/25/2017    Lab Results  Component Value Date   CREATININE 0.64 09/25/2017   BUN 9 09/25/2017   NA 137 09/25/2017   K 3.9 09/25/2017   CL 105 09/25/2017   CO2 25 09/25/2017   Lab Results  Component Value Date   ALT 12 09/25/2017   AST 12 09/25/2017   ALKPHOS 62 09/25/2017   BILITOT 0.4 09/25/2017     Physical Exam: BP 112/84 (BP Location: Left Arm, Patient Position: Sitting, Cuff Size: Normal)   Pulse 80   Ht 5' 6.75" (1.695 m)   Wt 184 lb 4 oz (83.6 kg)   LMP 02/25/2018   Breastfeeding? No   BMI 29.07 kg/m  Constitutional: Pleasant,well-developed, female in no acute distress. HEENT: Normocephalic and atraumatic. Conjunctivae are normal. No scleral icterus. Neck supple.  Cardiovascular: Normal rate, regular rhythm.  Pulmonary/chest: Effort normal and breath sounds normal. No wheezing, rales or rhonchi. Abdominal: Soft, nondistended, nontender. There are no masses palpable. No hepatomegaly. Extremities: no edema Lymphadenopathy: No cervical adenopathy noted. Neurological: Alert and oriented to person place and time. Skin: Skin is warm and dry. No rashes noted. Psychiatric: Normal mood and affect. Behavior is normal.   ASSESSMENT AND PLAN: 22 year old female with a history of perforating ileocolonic Crohn's disease, here for reassessment of the following issues.  Crohn's disease - now on Entyvio given she did not respond to dose optimization of Humira.  Generally she is feeling better on this regimen, not much pain, some cramping, but now having some loose stools at times with some urgency.  Will send  stool study to rule out C. difficile given she has high risk for this while being on Entyvio.  Otherwise she is due for basic labs today with CBC, LFTs, renal function, vitamin D testing.  I will give her some Zofran for nausea, will give her some Bentyl as needed.  In light of her headaches as outlined, and discussion of that issue, will place her on Elavil 10 mg 2 at bedtime for 2 weeks and titrate to 20 mg nightly as tolerated.  Hopefully this may help reduce her stool frequency as well treat her headaches.  Ultimately within the next few months recommend a colonoscopy to restage her disease on Entyvio after she has been on this for 6 months or so. I think putting her on 6MP low dose to prevent immunogenicity given she experienced antibody formation in the past to Humira is reasonable, will await labs first.  She was agreeable to this plan.  Will await labs first and her course on the  regimen as outlined.  If C. difficile is negative she can also use Imodium as needed, or may consider an empiric course of rifaximin (at risk for SIBO given surgical history).  Headaches - suspect related to [facility] given time course (reported up to 12% patients on Entyvio experience headaches).  Starting on Elavil to see if this helps.  Hopefully we can manage headaches with medications as Weyman Rodney appears to be helping her since she started it for her Crohn's disease.  If headaches fail to resolve or getting worse, she should contact me for reassessment.  [place] Cellar, MD [facility]  CC: Morene Crocker, CNM

## 2018-03-21 NOTE — Patient Instructions (Signed)
If you are age 24 or older, your body mass index should be between 23-30. Your Body mass index is 29.07 kg/m. If this is out of the aforementioned range listed, please consider follow up with your Primary Care Provider.  If you are age 61 or younger, your body mass index should be between 19-25. Your Body mass index is 29.07 kg/m. If this is out of the aformentioned range listed, please consider follow up with your Primary Care Provider.   Please go to the lab in the basement of our building to have lab work done as you leave today.  We have sent the following medications to your pharmacy for you to pick up at your convenience: [place]  Thank you for entrusting me with your care and for choosing [facility], Dr. Greeley Cellar

## 2018-03-24 ENCOUNTER — Encounter (HOSPITAL_COMMUNITY): Payer: Self-pay

## 2018-03-24 ENCOUNTER — Emergency Department (HOSPITAL_COMMUNITY): Payer: Medicaid Other

## 2018-03-24 ENCOUNTER — Other Ambulatory Visit: Payer: Self-pay

## 2018-03-24 ENCOUNTER — Emergency Department (HOSPITAL_COMMUNITY)
Admission: EM | Admit: 2018-03-24 | Discharge: 2018-03-24 | Disposition: A | Discharge status: Home / Self Care | Payer: Medicaid Other | Attending: Emergency Medicine | Admitting: Emergency Medicine

## 2018-03-24 DIAGNOSIS — R05 Cough: Secondary | ICD-10-CM | POA: Diagnosis present

## 2018-03-24 DIAGNOSIS — J020 Streptococcal pharyngitis: Secondary | ICD-10-CM | POA: Insufficient documentation

## 2018-03-24 DIAGNOSIS — R112 Nausea with vomiting, unspecified: Secondary | ICD-10-CM | POA: Diagnosis not present

## 2018-03-24 DIAGNOSIS — R197 Diarrhea, unspecified: Secondary | ICD-10-CM | POA: Diagnosis not present

## 2018-03-24 LAB — RAPID STREP SCREEN (MED CTR MEBANE ONLY): Streptococcus, Group A Screen (Direct): POSITIVE — AB

## 2018-03-24 MED ORDER — SODIUM CHLORIDE 0.9 % IV BOLUS
1000.0000 mL | Freq: Once | INTRAVENOUS | Status: AC
  Administered 2018-03-24: 1000 mL via INTRAVENOUS

## 2018-03-24 MED ORDER — DEXAMETHASONE SODIUM PHOSPHATE 10 MG/ML IJ SOLN
10.0000 mg | Freq: Once | INTRAMUSCULAR | Status: AC
  Administered 2018-03-24: 10 mg via INTRAVENOUS
  Filled 2018-03-24: qty 1

## 2018-03-24 MED ORDER — DEXAMETHASONE SODIUM PHOSPHATE 10 MG/ML IJ SOLN: 10.0000 mg | Freq: Once | INTRAMUSCULAR | Status: DC

## 2018-03-24 MED ORDER — ONDANSETRON HCL 4 MG/2ML IJ SOLN
4.0000 mg | Freq: Once | INTRAMUSCULAR | Status: AC
  Administered 2018-03-24: 4 mg via INTRAVENOUS
  Filled 2018-03-24 (×2): qty 2

## 2018-03-24 MED ORDER — PENICILLIN G BENZATHINE 1200000 UNIT/2ML IM SUSP
1.2000 10*6.[IU] | Freq: Once | INTRAMUSCULAR | Status: AC
  Administered 2018-03-24: 1.2 10*6.[IU] via INTRAMUSCULAR
  Filled 2018-03-24: qty 2

## 2018-03-24 MED ORDER — ONDANSETRON HCL 4 MG PO TABS: 4.0000 mg | ORAL_TABLET | Freq: Three times a day (TID) | ORAL | 0 refills | Status: DC | PRN

## 2018-03-24 MED ORDER — ACETAMINOPHEN 325 MG PO TABS
650.0000 mg | ORAL_TABLET | Freq: Once | ORAL | Status: AC | PRN
  Administered 2018-03-24: 650 mg via ORAL
  Filled 2018-03-24: qty 2

## 2018-03-24 NOTE — ED Notes (Signed)
This RN attempted Iv access twice without success, another rn to try

## 2018-03-24 NOTE — ED Triage Notes (Signed)
Patient complains of 3 days of sore throat, fever and body aches, using otc meds with minimal relief.

## 2018-03-24 NOTE — Discharge Instructions (Signed)
You may take tylenol for your fevers and your sore throat.  You should follow up with your gastroenterologist if you continue to have diarrhea or if you begin to have bloody diarrhea. You should make sure to stay hydrated over the next several days. If you develop any worsening symptoms including worsening sore throat, inability to swallow, changes to your voice, persistent vomiting, abdominal pain, or blood diarrhea you should return the the ER immediately.

## 2018-03-24 NOTE — ED Provider Notes (Signed)
[place] EMERGENCY DEPARTMENT Provider Note   CSN: [id number] Arrival date & time: 03/24/18  1802     History   Chief Complaint Chief Complaint  Patient presents with  . fever/congestion    HPI Jessica [name] is a 22 y.o. female.  HPI  Pt Is a 22 year old female with history of anemia, Crohn's disease who presents the ED today complaining of productive cough with yellow sputum, congestion, runny nose, sore throat, body aches, headache, fevers which have been going on for the last 4 days.  States her headache was gradual in onset.  Rates it 8/10.  Denies worse headache of life.  Denies any associated vision changes, numbness, weakness, photophobia.  Does endorse some nausea, vomiting, diarrhea that has also been ongoing for about 3 days.  No blood in stool or vomit. Has had some crampy lower abd pain that resolves after episodes of diarrhea. Denies persistent abd pain.  States that she does not feel like a Crohn's flare.  States she was recently exposed to sick contacts with similar symptoms.  Denies leg pain/swelling, hemoptysis, recent surgery/trauma, recent long travel, hormone use, personal hx of cancer, or hx of DVT/PE.   Past Medical History:  Diagnosis Date  . Anemia   . Crohn's disease ([place])   . Osteoporosis   . Vitamin D deficiency     Patient Active Problem List   Diagnosis Date Noted  . NSVD (normal spontaneous vaginal delivery) 05/04/2016  . Crohn's colitis ([place]) 05/03/2016  . Nausea and vomiting 01/22/2015  . Crohn's ileitis ([place]) 01/22/2015  . Diarrhea 01/22/2015  . Malnutrition of moderate degree ([place]) 01/05/2015  . Nausea 01/03/2015  . 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 Absolute anemia   . CC (Crohn's colitis) ([place]) 11/02/2014  . Rectal bleeding 11/02/2014  . Crohn disease ([place]) 11/01/2014  . Missed abortion 10/31/2014  . Gestational hypertension w/o significant proteinuria in 3rd trimester 07/14/2013  . Supervision of normal first  pregnancy 05/03/2013  . Crohn's disease ([place]) 05/03/2013  . Ileostomy in place [facility]) 05/03/2013    Past Surgical History:  Procedure Laterality Date  . COLONOSCOPY N/A 01/22/2015   Procedure: COLONOSCOPY;  Surgeon: [name], MD;  [facility];  Service: Endoscopy;  Laterality: N/A;  . DILATION AND EVACUATION N/A 10/31/2014   Procedure: DILATATION AND EVACUATION;  Surgeon: Lahoma Crocker, MD;  Location: [facility];  Service: Gynecology;  Laterality: N/A;  . ESOPHAGOGASTRODUODENOSCOPY N/A 01/22/2015   Procedure: ESOPHAGOGASTRODUODENOSCOPY (EGD);  Surgeon: [name], MD;  [facility];  Service: Endoscopy;  Laterality: N/A;  . FLEXIBLE SIGMOIDOSCOPY N/A 11/04/2014   Procedure: FLEXIBLE SIGMOIDOSCOPY;  Surgeon: [name], MD;  Location[facility];  Service: Endoscopy;  Laterality: N/A;  . ILEOSTOMY CLOSURE N/A 12/18/2014   Procedure: LOOP ILEOSTOMY REVERSAL;  Surgeon: [name], MD;  Location: [facility];  Service: General;  Laterality: N/A;  . INCISION AND DRAINAGE Left    arm  . SMALL INTESTINE SURGERY       OB History    Gravida  3   Para  2   Term  2   Preterm      AB  1   Living  2     SAB  1   TAB      Ectopic      Multiple  0   Live Births  2            Home Medications    Prior to Admission medications  Medication Sig Start Date End Date Taking? Authorizing Provider  acetaminophen (TYLENOL) 500 MG tablet Take 1,000 mg by mouth every 6 (six) hours as needed for headache (pain).   Yes [provider]  dicyclomine (BENTYL) 10 MG capsule Take 1 capsule (10 mg total) by mouth every 8 (eight) hours as needed for spasms. 03/21/18  Yes Armbruster, Carlota Raspberry, MD  ondansetron (ZOFRAN ODT) 4 MG disintegrating tablet Take 1 tablet (4 mg total) by mouth every 6 (six) hours as needed for nausea or vomiting. 03/21/18  Yes Armbruster, Carlota Raspberry, MD  Phenylephrine-DM-GG-APAP (MUCINEX FAST-MAX COLD FLU) 5-10-200-325 MG TABS Take 2  tablets by mouth every 6 (six) hours as needed (cold/flu symptoms).   Yes [provider]  vedolizumab (ENTYVIO) 300 MG injection Inject 300 mg into the vein every 6 (six) weeks.    Yes [provider]  Vitamin D, Ergocalciferol, (DRISDOL) 50000 units CAPS capsule Take 1 capsule (50,000 Units total) by mouth every 7 (seven) days. Patient taking differently: Take 50,000 Units by mouth every Friday.  03/21/18  Yes Armbruster, Carlota Raspberry, MD  amitriptyline (ELAVIL) 10 MG tablet Take 10 mg (1 tablet) by mouth every evening for 2 weeks, then take 20 mg ( 2 tablets) by mouth every evening Patient not taking: Reported on 03/24/2018 03/21/18   Armbruster, Carlota Raspberry, MD  ondansetron (ZOFRAN) 4 MG tablet Take 1 tablet (4 mg total) by mouth every 8 (eight) hours as needed for nausea or vomiting. 03/24/18   Tabbetha Kutscher S, PA-C    Family History Family History  Problem Relation Age of Onset  . Diabetes Father   . Diabetes Maternal Grandmother   . Heart disease Maternal Grandmother   . Hypertension Maternal Grandmother   . Kidney disease Maternal Grandmother   . Lung cancer Maternal Grandfather   . Hyperlipidemia Maternal Grandfather   . Stroke Neg Hx   . Colon cancer Neg Hx     Social History Social History   Tobacco Use  . Smoking status: Never Smoker  . Smokeless tobacco: Never Used  Substance Use Topics  . Alcohol use: No    Alcohol/week: 0.0 oz  . Drug use: No     Allergies   Patient has no known allergies.   Review of Systems Review of Systems  Constitutional: Negative for chills and fever.  HENT: Positive for congestion and sore throat. Negative for ear pain, postnasal drip, rhinorrhea and trouble swallowing.   Eyes: Negative for visual disturbance.  Respiratory: Positive for cough. Negative for shortness of breath.   Cardiovascular: Negative for chest pain and leg swelling.  Gastrointestinal: Positive for diarrhea, nausea and vomiting. Negative for abdominal pain  and constipation.  Genitourinary: Negative for dysuria, hematuria and urgency.  Musculoskeletal: Negative for back pain.  Skin: Negative for rash.  Neurological: Positive for headaches. Negative for dizziness, weakness, light-headedness and numbness.  All other systems reviewed and are negative.    Physical Exam Updated Vital Signs BP 122/80 (BP Location: Right Arm)   Pulse 88   Temp 100.1 F (37.8 C) (Oral)   Resp 16   Ht 5' 6.5" (1.689 m)   Wt 83.5 kg (184 lb)   LMP 02/25/2018   SpO2 100%   BMI 29.25 kg/m   Physical Exam  Constitutional: She is oriented to person, place, and time. She appears well-developed and well-nourished. No distress.  Nontoxic appearing  HENT:  Head: Normocephalic and atraumatic.  Right Ear: External ear normal.  Left Ear: External ear normal.  Nose: Nose normal.  Mouth/Throat: Oropharynx is clear and moist.  Pharyngeal erythema. Tonsillar edema bilat. No tonsillar kissing. Uvula midline. No evidence of PTA or retropharyngeal abscess. Tolerating secretions. Patent airway.   Eyes: Pupils are equal, round, and reactive to light. Conjunctivae and EOM are normal.  Neck: Normal range of motion. Neck supple.  No nuchal rigidity  Cardiovascular: Normal rate, regular rhythm, normal heart sounds and intact distal pulses.  No murmur heard. Pulmonary/Chest: Effort normal and breath sounds normal. No stridor. No respiratory distress. She has no wheezes.  Abdominal: Soft. Bowel sounds are normal. She exhibits no distension. There is no tenderness. There is no guarding.  Musculoskeletal: She exhibits no edema.  No calf TTP, erythema, swelling.  Neurological: She is alert and oriented to person, place, and time.  Mental Status:  Alert, thought content appropriate, able to give a coherent history. Speech fluent without evidence of aphasia. Able to follow 2 step commands without difficulty.  Cranial Nerves:  II:  pupils equal, round, reactive to light III,IV,  VI: ptosis not present, extra-ocular motions intact bilaterally  V,VII: smile symmetric, facial light touch sensation equal VIII: hearing grossly normal to voice  X: uvula elevates symmetrically  XI: bilateral shoulder shrug symmetric and strong XII: midline tongue extension without fassiculations Motor:  Normal tone. 5/5 strength of BUE and BLE major muscle groups including strong and equal grip strength and dorsiflexion/plantar flexion Sensory: light touch normal in all extremities. CV: 2+ radial and DP/PT pulses  Skin: Skin is warm and dry.  Psychiatric: She has a normal mood and affect.  Nursing note and vitals reviewed.    ED Treatments / Results  Labs (all labs ordered are listed, but only abnormal results are displayed) Labs Reviewed  RAPID STREP SCREEN (NOT AT [facility]) - Abnormal; Notable for the following components:      Result Value   Streptococcus, Group A Screen (Direct) POSITIVE (*)    All other components within normal limits    EKG None  Radiology Dg Chest 2 View  Result Date: 03/24/2018 CLINICAL DATA:  Dry cough and fever. EXAM: CHEST - 2 VIEW COMPARISON:  02/26/2015 FINDINGS: The cardiopericardial silhouette is within normal limits for size. The lungs are clear without focal pneumonia, edema, pneumothorax or pleural effusion. The visualized bony structures of the thorax are intact. IMPRESSION: No active cardiopulmonary disease. Electronically Signed   By: Misty Stanley M.D.   On: 03/24/2018 19:18    Procedures Procedures (including critical care time)  Medications Ordered in ED Medications  acetaminophen (TYLENOL) tablet 650 mg (650 mg Oral Given 03/24/18 1818)  sodium chloride 0.9 % bolus 1,000 mL (0 mLs Intravenous Stopped 03/24/18 2122)  ondansetron (ZOFRAN) injection 4 mg (4 mg Intravenous Given 03/24/18 2027)  penicillin g benzathine (BICILLIN LA) 1200000 UNIT/2ML injection 1.2 Million Units (1.2 Million Units Intramuscular Given 03/24/18 1945)  dexamethasone  (DECADRON) injection 10 mg (10 mg Intravenous Given 03/24/18 2028)     Initial Impression / Assessment and Plan / ED Course  I have reviewed the triage vital signs and the nursing notes.  Pertinent labs & imaging results that were available during my care of the patient were reviewed by me and considered in my medical decision making (see chart for details).      Final Clinical Impressions(s) / ED Diagnoses   Final diagnoses:  Strep pharyngitis  Nausea vomiting and diarrhea   Pt febrile with tonsillar exudate, & dysphagia; diagnosis of strep. Also with flu like sxs and NVD,  negative CXR. H/o crohn's but do not suspect acute crohn's flare. No blood diarrhea or persistent abd pain. Treated in the Ed with steroids, tylenol, and PCN IM.  Pt appears mildly dehydrated, fluid bolus given due to tachycardia. VS improved after hydration and tx of fever. discussed importance of water rehydration. Presentation non concerning for PTA or infxn spread to soft tissue. No trismus or uvula deviation. Specific return precautions discussed. Pt able to drink water in ED without difficulty with intact air way. Recommended PCP follow up and GI f/u if diarrhea persists or becomes blood. All questions answered and pt understands the plan and reasons to return.    ED Discharge Orders        Ordered    ondansetron (ZOFRAN) 4 MG tablet  Every 8 hours PRN     03/24/18 1953       Rodney Booze, PA-C 03/24/18 2327    Fatima Blank, MD 03/25/18 1310

## 2018-03-28 ENCOUNTER — Other Ambulatory Visit: Payer: Medicaid Other

## 2018-03-28 DIAGNOSIS — R519 Headache, unspecified: Secondary | ICD-10-CM

## 2018-03-28 DIAGNOSIS — K50819 Crohn's disease of both small and large intestine with unspecified complications: Secondary | ICD-10-CM

## 2018-03-28 DIAGNOSIS — R51 Headache: Secondary | ICD-10-CM

## 2018-03-29 LAB — CLOSTRIDIUM DIFFICILE TOXIN B, QUALITATIVE, REAL-TIME PCR: Toxigenic C. Difficile by PCR: NOT DETECTED

## 2018-03-30 ENCOUNTER — Other Ambulatory Visit: Payer: Self-pay

## 2018-03-30 MED ORDER — MERCAPTOPURINE 50 MG PO TABS: 50.0000 mg | ORAL_TABLET | Freq: Every day | ORAL | 1 refills | Status: DC

## 2018-04-02 ENCOUNTER — Telehealth: Payer: Self-pay | Admitting: Gastroenterology

## 2018-04-02 ENCOUNTER — Other Ambulatory Visit (HOSPITAL_COMMUNITY): Payer: Self-pay | Admitting: *Deleted

## 2018-04-02 ENCOUNTER — Other Ambulatory Visit: Payer: Self-pay

## 2018-04-02 DIAGNOSIS — K50819 Crohn's disease of both small and large intestine with unspecified complications: Secondary | ICD-10-CM

## 2018-04-02 NOTE — Telephone Encounter (Signed)
Orders placed for Entyvio.

## 2018-04-03 ENCOUNTER — Ambulatory Visit (HOSPITAL_COMMUNITY)
Admission: RE | Admit: 2018-04-03 | Discharge: 2018-04-03 | Disposition: A | Discharge status: Home / Self Care | Payer: Medicaid Other | Source: Ambulatory Visit | Attending: Gastroenterology | Admitting: Gastroenterology

## 2018-04-03 DIAGNOSIS — K50819 Crohn's disease of both small and large intestine with unspecified complications: Secondary | ICD-10-CM | POA: Diagnosis not present

## 2018-04-03 MED ORDER — VEDOLIZUMAB 300 MG IV SOLR
300.0000 mg | Freq: Once | INTRAVENOUS | Status: AC
  Administered 2018-04-03: 300 mg via INTRAVENOUS
  Filled 2018-04-03: qty 5

## 2018-05-28 ENCOUNTER — Telehealth: Payer: Self-pay | Admitting: Gastroenterology

## 2018-05-28 NOTE — Telephone Encounter (Signed)
Patient is due for restaging colonoscopy since being on Entyvio. Can this patient be done in [place]?

## 2018-05-28 NOTE — Telephone Encounter (Signed)
Patient states she was told to have recall colon in July of this year 2019. I did not see this suggestion in last ov note from 4.3.19. And if so is it okay to schedule later than July due to appt slots being full. Please advise.

## 2018-05-28 NOTE — Telephone Encounter (Signed)
Please schedule patient for first available [place] colonoscopy, August is okay. She will also need to have a pre-visit appointment. Thank you.

## 2018-05-28 NOTE — Telephone Encounter (Signed)
Yes, please schedule for colonoscopy at [facility]. Thanks

## 2018-05-29 ENCOUNTER — Telehealth: Payer: Self-pay | Admitting: Gastroenterology

## 2018-05-29 ENCOUNTER — Ambulatory Visit (HOSPITAL_COMMUNITY)
Admission: RE | Admit: 2018-05-29 | Discharge: 2018-05-29 | Disposition: A | Discharge status: Home / Self Care | Payer: Medicaid Other | Source: Ambulatory Visit | Attending: Gastroenterology | Admitting: Gastroenterology

## 2018-05-29 ENCOUNTER — Other Ambulatory Visit: Payer: Self-pay

## 2018-05-29 DIAGNOSIS — K50819 Crohn's disease of both small and large intestine with unspecified complications: Secondary | ICD-10-CM

## 2018-05-29 MED ORDER — VEDOLIZUMAB 300 MG IV SOLR
300.0000 mg | Freq: Once | INTRAVENOUS | Status: AC
  Administered 2018-05-29: 300 mg via INTRAVENOUS
  Filled 2018-05-29: qty 5

## 2018-05-29 NOTE — Telephone Encounter (Signed)
Kristen from [facility] states pt is there but no orders are in Epic. Requesting orders be put in so pt can be seen.

## 2018-05-29 NOTE — Telephone Encounter (Signed)
Orders placed for Entyvio.

## 2018-06-12 ENCOUNTER — Encounter: Payer: Self-pay | Admitting: Gastroenterology

## 2018-07-23 ENCOUNTER — Other Ambulatory Visit: Payer: Self-pay

## 2018-07-23 ENCOUNTER — Other Ambulatory Visit (HOSPITAL_COMMUNITY): Payer: Self-pay | Admitting: *Deleted

## 2018-07-23 DIAGNOSIS — K50819 Crohn's disease of both small and large intestine with unspecified complications: Secondary | ICD-10-CM

## 2018-07-24 ENCOUNTER — Ambulatory Visit (HOSPITAL_COMMUNITY): Admission: RE | Admit: 2018-07-24 | Payer: Medicaid Other | Source: Ambulatory Visit

## 2018-08-01 ENCOUNTER — Ambulatory Visit (HOSPITAL_COMMUNITY)
Admission: RE | Admit: 2018-08-01 | Discharge: 2018-08-01 | Disposition: A | Discharge status: Home / Self Care | Payer: Medicaid Other | Source: Ambulatory Visit | Attending: Gastroenterology | Admitting: Gastroenterology

## 2018-08-01 DIAGNOSIS — K50819 Crohn's disease of both small and large intestine with unspecified complications: Secondary | ICD-10-CM | POA: Diagnosis not present

## 2018-08-01 LAB — CBC WITH DIFFERENTIAL/PLATELET
Abs Immature Granulocytes: 0 10*3/uL (ref 0.0–0.1)
Basophils Absolute: 0 10*3/uL (ref 0.0–0.1)
Basophils Relative: 1 %
Eosinophils Absolute: 0.1 10*3/uL (ref 0.0–0.7)
Eosinophils Relative: 1 %
HCT: 40.1 % (ref 36.0–46.0)
Hemoglobin: 11.9 g/dL — ABNORMAL LOW (ref 12.0–15.0)
Immature Granulocytes: 0 %
Lymphocytes Relative: 29 %
Lymphs Abs: 1.9 10*3/uL (ref 0.7–4.0)
MCH: 26 pg (ref 26.0–34.0)
MCHC: 29.7 g/dL — ABNORMAL LOW (ref 30.0–36.0)
MCV: 87.6 fL (ref 78.0–100.0)
Monocytes Absolute: 0.6 10*3/uL (ref 0.1–1.0)
Monocytes Relative: 8 %
Neutro Abs: 4 10*3/uL (ref 1.7–7.7)
Neutrophils Relative %: 61 %
Platelets: 300 10*3/uL (ref 150–400)
RBC: 4.58 MIL/uL (ref 3.87–5.11)
RDW: 13.7 % (ref 11.5–15.5)
WBC: 6.5 10*3/uL (ref 4.0–10.5)

## 2018-08-01 LAB — COMPREHENSIVE METABOLIC PANEL
ALT: 16 U/L (ref 0–44)
AST: 20 U/L (ref 15–41)
Albumin: 3.7 g/dL (ref 3.5–5.0)
Alkaline Phosphatase: 68 U/L (ref 38–126)
Anion gap: 5 (ref 5–15)
BUN: 9 mg/dL (ref 6–20)
CO2: 28 mmol/L (ref 22–32)
Calcium: 9.5 mg/dL (ref 8.9–10.3)
Chloride: 105 mmol/L (ref 98–111)
Creatinine, Ser: 0.61 mg/dL (ref 0.44–1.00)
GFR calc Af Amer: >60 mL/min (ref >60)
GFR calc non Af Amer: >60 mL/min (ref >60)
Glucose, Bld: 89 mg/dL (ref 70–99)
Potassium: 4 mmol/L (ref 3.5–5.1)
Sodium: 138 mmol/L (ref 135–145)
Total Bilirubin: 0.4 mg/dL (ref 0.3–1.2)
Total Protein: 7.5 g/dL (ref 6.5–8.1)

## 2018-08-01 MED ORDER — VEDOLIZUMAB 300 MG IV SOLR
300.0000 mg | INTRAVENOUS | Status: DC
  Administered 2018-08-01: 300 mg via INTRAVENOUS
  Filled 2018-08-01: qty 5

## 2018-08-02 ENCOUNTER — Ambulatory Visit (AMBULATORY_SURGERY_CENTER): Payer: Self-pay

## 2018-08-02 VITALS — Ht 66.0 in | Wt 186.6 lb

## 2018-08-02 DIAGNOSIS — K50819 Crohn's disease of both small and large intestine with unspecified complications: Secondary | ICD-10-CM

## 2018-08-02 MED ORDER — NA SULFATE-K SULFATE-MG SULF 17.5-3.13-1.6 GM/177ML PO SOLN: 1.0000 | Freq: Once | ORAL | 0 refills | Status: AC

## 2018-08-02 NOTE — Progress Notes (Signed)
Per pt, no allergies to soy or egg products.Pt not taking any weight loss meds or using  O2 at home.  Pt refused emmi video. 

## 2018-08-14 ENCOUNTER — Encounter: Payer: Self-pay | Admitting: Gastroenterology

## 2018-08-14 ENCOUNTER — Ambulatory Visit (AMBULATORY_SURGERY_CENTER): Payer: Medicaid Other | Admitting: Gastroenterology

## 2018-08-14 VITALS — BP 116/78 | HR 74 | Temp 98.0°F | Resp 20 | Ht 66.0 in | Wt 186.0 lb

## 2018-08-14 DIAGNOSIS — Z86010 Personal history of colonic polyps: Secondary | ICD-10-CM | POA: Diagnosis not present

## 2018-08-14 DIAGNOSIS — I10 Essential (primary) hypertension: Secondary | ICD-10-CM | POA: Diagnosis not present

## 2018-08-14 DIAGNOSIS — K50819 Crohn's disease of both small and large intestine with unspecified complications: Secondary | ICD-10-CM | POA: Diagnosis not present

## 2018-08-14 MED ORDER — SODIUM CHLORIDE 0.9 % IV SOLN: 500.0000 mL | Freq: Once | INTRAVENOUS | Status: DC

## 2018-08-14 NOTE — Progress Notes (Signed)
Report given to PACU, vss 

## 2018-08-14 NOTE — Op Note (Signed)
Jessica [name] Patient Name: Jessica [name] Procedure Date: 08/14/2018 2:02 PM MRN: [id number] Endoscopist: Remo Lipps P. Havery Moros , MD Age: 22 Referring MD:  Date of Birth: 1996-01-27 Gender: Female Account #: [id number] Procedure:                Colonoscopy Indications:              Follow-up of Crohn's disease of the small bowel, on                            Entyvio and 6MP, doing well Medicines:                Monitored Anesthesia Care Procedure:                Pre-Anesthesia Assessment:                           - Prior to the procedure, a History and Physical                            was performed, and patient medications and                            allergies were reviewed. The patient's tolerance of                            previous anesthesia was also reviewed. The risks                            and benefits of the procedure and the sedation                            options and risks were discussed with the patient.                            All questions were answered, and informed consent                            was obtained. Prior Anticoagulants: The patient has                            taken no previous anticoagulant or antiplatelet                            agents. ASA Grade Assessment: II - A patient with                            mild systemic disease. After reviewing the risks                            and benefits, the patient was deemed in                            satisfactory condition to undergo the procedure.  After obtaining informed consent, the colonoscope                            was passed under direct vision. Throughout the                            procedure, the patient's blood pressure, pulse, and                            oxygen saturations were monitored continuously. The                            Colonoscope was introduced through the anus and                            advanced to the the  ileocolonic anastomosis. The                            colonoscopy was performed without difficulty. The                            patient tolerated the procedure well. The quality                            of the bowel preparation was adequate. The terminal                            ileum and the rectum were photographed. Scope In: 2:08:17 PM Scope Out: 2:21:51 PM Scope Withdrawal Time: 0 hours 11 minutes 27 seconds  Total Procedure Duration: 0 hours 13 minutes 34 seconds  Findings:                 The perianal and digital rectal examinations were                            normal.                           The terminal ileum appeared normal.                           There was evidence of a prior end-to-side                            ileo-colonic anastomosis in the ascending colon.                            This was patent and was characterized by healthy                            appearing mucosa. No inflammation.                           The exam was otherwise without abnormality on  direct and retroflexion views. No inflammatory                            changes. Complications:            No immediate complications. Estimated blood loss:                            None. Estimated Blood Loss:     Estimated blood loss: none. Impression:               - The examined portion of the ileum was normal.                           - Patent end-to-side ileo-colonic anastomosis,                            characterized by healthy appearing mucosa.                           - The examination was otherwise normal on direct                            and retroflexion views.                           Overall, excellent control of Crohn's disease. Recommendation:           - Patient has a contact number available for                            emergencies. The signs and symptoms of potential                            delayed complications were discussed with the                             patient. Return to normal activities tomorrow.                            Written discharge instructions were provided to the                            patient.                           - Resume previous diet.                           - Continue present medications Demarian Epps P. Londyn Wotton, MD 08/14/2018 2:31:41 PM This report has been signed electronically.

## 2018-08-14 NOTE — Patient Instructions (Signed)
YOU HAD AN ENDOSCOPIC PROCEDURE TODAY AT [facility]:   Refer to the procedure report that was given to you for any specific questions about what was found during the examination.  If the procedure report does not answer your questions, please call your gastroenterologist to clarify.  If you requested that your care partner not be given the details of your procedure findings, then the procedure report has been included in a sealed envelope for you to review at your convenience later.  YOU SHOULD EXPECT: Some feelings of bloating in the abdomen. Passage of more gas than usual.  Walking can help get rid of the air that was put into your GI tract during the procedure and reduce the bloating. If you had a lower endoscopy (such as a colonoscopy or flexible sigmoidoscopy) you may notice spotting of blood in your stool or on the toilet paper. If you underwent a bowel prep for your procedure, you may not have a normal bowel movement for a few days.  Please Note:  You might notice some irritation and congestion in your nose or some drainage.  This is from the oxygen used during your procedure.  There is no need for concern and it should clear up in a day or so.  SYMPTOMS TO REPORT IMMEDIATELY:   Following lower endoscopy (colonoscopy or flexible sigmoidoscopy):  Excessive amounts of blood in the stool  Significant tenderness or worsening of abdominal pains  Swelling of the abdomen that is new, acute  Fever of 100F or higher For urgent or emergent issues, a gastroenterologist can be reached at any hour by calling [phone].   DIET:  We do recommend a small meal at first, but then you may proceed to your regular diet.  Drink plenty of fluids but you should avoid alcoholic beverages for 24 hours.  ACTIVITY:  You should plan to take it easy for the rest of today and you should NOT DRIVE or use heavy machinery until tomorrow (because of the sedation medicines used during the test).     FOLLOW UP: Our staff will call the number listed on your records the next business day following your procedure to check on you and address any questions or concerns that you may have regarding the information given to you following your procedure. If we do not reach you, we will leave a message.  However, if you are feeling well and you are not experiencing any problems, there is no need to return our call.  We will assume that you have returned to your regular daily activities without incident.  If any biopsies were taken you will be contacted by phone or by letter within the next 1-3 weeks.  Please call us at [phone] if you have not heard about the biopsies in 3 weeks.    SIGNATURES/CONFIDENTIALITY: You and/or your care partner have signed paperwork which will be entered into your electronic medical record.  These signatures attest to the fact that that the information above on your After Visit Summary has been reviewed and is understood.  Full responsibility of the confidentiality of this discharge information lies with you and/or your care-partner.

## 2018-08-15 ENCOUNTER — Telehealth: Payer: Self-pay | Admitting: *Deleted

## 2018-08-15 ENCOUNTER — Telehealth: Payer: Self-pay

## 2018-08-15 NOTE — Telephone Encounter (Signed)
  Follow up Call-  Call back number 08/14/2018  Post procedure Call Back phone  # [phone]  Permission to leave phone message Yes  Some recent data might be hidden     Patient questions:  Do you have a fever, pain , or abdominal swelling? No. Pain Score  0 *  Have you tolerated food without any problems? Yes.    Have you been able to return to your normal activities? Yes.    Do you have any questions about your discharge instructions: Diet   No. Medications  No. Follow up visit  No.  Do you have questions or concerns about your Care? No.  Actions: * If pain score is 4 or above: No action needed, pain <4.

## 2018-08-15 NOTE — Telephone Encounter (Signed)
No answer for post procedure call back. Left message and will attempt to call back patient this afternoon. SM

## 2018-09-26 ENCOUNTER — Ambulatory Visit (HOSPITAL_COMMUNITY): Admission: RE | Admit: 2018-09-26 | Payer: Medicaid Other | Source: Ambulatory Visit

## 2018-10-02 ENCOUNTER — Ambulatory Visit (HOSPITAL_COMMUNITY)
Admission: RE | Admit: 2018-10-02 | Discharge: 2018-10-02 | Disposition: A | Discharge status: Home / Self Care | Payer: Medicaid Other | Source: Ambulatory Visit | Attending: Gastroenterology | Admitting: Gastroenterology

## 2018-10-02 DIAGNOSIS — K50819 Crohn's disease of both small and large intestine with unspecified complications: Secondary | ICD-10-CM | POA: Insufficient documentation

## 2018-10-02 MED ORDER — VEDOLIZUMAB 300 MG IV SOLR
300.0000 mg | INTRAVENOUS | Status: DC
  Administered 2018-10-02: 300 mg via INTRAVENOUS
  Filled 2018-10-02: qty 5

## 2018-11-01 DIAGNOSIS — H00024 Hordeolum internum left upper eyelid: Secondary | ICD-10-CM | POA: Diagnosis not present

## 2018-11-13 DIAGNOSIS — H10013 Acute follicular conjunctivitis, bilateral: Secondary | ICD-10-CM | POA: Diagnosis not present

## 2018-11-13 DIAGNOSIS — H40033 Anatomical narrow angle, bilateral: Secondary | ICD-10-CM | POA: Diagnosis not present

## 2018-11-13 DIAGNOSIS — H5213 Myopia, bilateral: Secondary | ICD-10-CM | POA: Diagnosis not present

## 2018-11-20 ENCOUNTER — Telehealth: Payer: Self-pay | Admitting: Gastroenterology

## 2018-11-20 ENCOUNTER — Other Ambulatory Visit: Payer: Self-pay

## 2018-11-20 DIAGNOSIS — K50819 Crohn's disease of both small and large intestine with unspecified complications: Secondary | ICD-10-CM

## 2018-11-20 NOTE — Telephone Encounter (Signed)
Orders placed.

## 2018-11-21 ENCOUNTER — Encounter (HOSPITAL_COMMUNITY): Admission: RE | Admit: 2018-11-21 | Payer: Medicaid Other | Source: Ambulatory Visit

## 2018-11-27 ENCOUNTER — Encounter (HOSPITAL_COMMUNITY): Payer: Medicaid Other

## 2018-12-03 ENCOUNTER — Ambulatory Visit (HOSPITAL_COMMUNITY)
Admission: RE | Admit: 2018-12-03 | Discharge: 2018-12-03 | Disposition: A | Discharge status: Home / Self Care | Payer: Medicaid Other | Source: Ambulatory Visit | Attending: Gastroenterology | Admitting: Gastroenterology

## 2018-12-03 DIAGNOSIS — K50819 Crohn's disease of both small and large intestine with unspecified complications: Secondary | ICD-10-CM | POA: Diagnosis not present

## 2018-12-03 MED ORDER — VEDOLIZUMAB 300 MG IV SOLR
300.0000 mg | Freq: Once | INTRAVENOUS | Status: AC
  Administered 2018-12-03: 300 mg via INTRAVENOUS
  Filled 2018-12-03: qty 5

## 2018-12-19 NOTE — L&D Delivery Note (Signed)
OB/[facility]N [facility] Delivery Note  Jessica [name] is a 23 y.o. [phone] s/p vaginal delivery at [redacted]w[redacted]d She was admitted for IOL 2/2 Crohn's flare.   ROM: rupture date, rupture time, delivery date, or delivery time have not been documented with clear fluid GBS Status: negative Maximum Maternal Temperature: 97.7 F  Labor Progress: She was initially induced with FB and cytotec. She progressed on her own after [facility] came out[facility]  Delivery Date/Time: 10/18/19, 1329 hours Delivery: Called to room and patient was complete and crowning. Head delivered ROA. No nuchal cord present. Shoulder and body delivered in usual fashion. Infant with spontaneous cry, placed on mother's abdomen, dried and stimulated. Cord clamped x 2 after 1-minute delay, and cut by grandmother of the baby under my direct supervision. Cord blood drawn. Placenta delivered spontaneously with gentle cord traction, there were trailing membranes which were grasped with the ring forceps and withdfrawn. Fundus and lower uterine segment firm with massage and Pitocin. Labia, perineum, vagina, and cervix were inspected, and found to be intact.   Placenta: intact, 3 vessel cord, trailing membranes Complications: none Lacerations: none EBL: 400 mL Analgesia: epidural  Postpartum Planning [x]  message to sent to schedule follow-up  [x]  MMR to be offered postpartum  Infant: female  APGARs 9, 9  3325 g  HMerilyn Baba DO OB/GYN Fellow, Faculty Practice

## 2019-01-11 ENCOUNTER — Encounter (HOSPITAL_COMMUNITY): Payer: Medicaid Other

## 2019-01-14 ENCOUNTER — Encounter (HOSPITAL_COMMUNITY): Payer: Medicaid Other

## 2019-01-15 ENCOUNTER — Other Ambulatory Visit: Payer: Self-pay

## 2019-01-15 DIAGNOSIS — K50819 Crohn's disease of both small and large intestine with unspecified complications: Secondary | ICD-10-CM

## 2019-01-15 MED ORDER — VEDOLIZUMAB 300 MG IV SOLR: 300.0000 mg | Freq: Once | INTRAVENOUS | Status: DC

## 2019-01-16 ENCOUNTER — Telehealth: Payer: Self-pay | Admitting: Gastroenterology

## 2019-01-16 ENCOUNTER — Other Ambulatory Visit: Payer: Self-pay

## 2019-01-16 ENCOUNTER — Ambulatory Visit (HOSPITAL_COMMUNITY)
Admission: RE | Admit: 2019-01-16 | Discharge: 2019-01-16 | Disposition: A | Discharge status: Home / Self Care | Payer: Medicaid Other | Source: Ambulatory Visit | Attending: Gastroenterology | Admitting: Gastroenterology

## 2019-01-16 DIAGNOSIS — K50819 Crohn's disease of both small and large intestine with unspecified complications: Secondary | ICD-10-CM

## 2019-01-16 NOTE — Telephone Encounter (Signed)
Called and spoke with Christin-information received; Also contacted Barb Merino, RN and information relayed-order signed;

## 2019-01-16 NOTE — Progress Notes (Signed)
Request sent to Dr Havery Moros 01/15/2019 for orders to be placed in epic.  No orders were entered.  Called the office 01/16/19 requesting orders and was told by the office the nurse was at lunch, which was an hour,  and she would send her a message to call when she got back.  I asked if there was anyone else I could speak to for the order and she said her manager was in an interview, and everyone else was with a patient so all she could do was leave the nurse a message to call back and would tell her it was urgent.  I explained the above to the patient and asked her if she was able to wait and she said no because she had to pick her son up from school so she needed to reschedule.  I asked her what worked best for her and set her appt for Monday 01/21/2019 at 1200.

## 2019-01-16 NOTE — Telephone Encounter (Signed)
Christin from [facility] called for a verbal order for entyvio for this pt.

## 2019-01-21 ENCOUNTER — Ambulatory Visit (HOSPITAL_COMMUNITY)
Admission: RE | Admit: 2019-01-21 | Discharge: 2019-01-21 | Disposition: A | Discharge status: Home / Self Care | Payer: Medicaid Other | Source: Ambulatory Visit | Attending: Gastroenterology | Admitting: Gastroenterology

## 2019-01-21 DIAGNOSIS — K50819 Crohn's disease of both small and large intestine with unspecified complications: Secondary | ICD-10-CM | POA: Diagnosis not present

## 2019-01-21 MED ORDER — VEDOLIZUMAB 300 MG IV SOLR
300.0000 mg | Freq: Once | INTRAVENOUS | Status: AC
  Administered 2019-01-21: 300 mg via INTRAVENOUS
  Filled 2019-01-21: qty 5

## 2019-02-21 ENCOUNTER — Ambulatory Visit (INDEPENDENT_AMBULATORY_CARE_PROVIDER_SITE_OTHER): Payer: Medicaid Other

## 2019-02-21 ENCOUNTER — Telehealth: Payer: Self-pay | Admitting: Gastroenterology

## 2019-02-21 DIAGNOSIS — Z3201 Encounter for pregnancy test, result positive: Secondary | ICD-10-CM

## 2019-02-21 DIAGNOSIS — Z3200 Encounter for pregnancy test, result unknown: Secondary | ICD-10-CM

## 2019-02-21 LAB — POCT URINE PREGNANCY: Preg Test, Ur: POSITIVE — AB

## 2019-02-21 NOTE — Progress Notes (Signed)
I have reviewed the chart and agree with nursing staff's documentation of this patient's encounter.  Verita Schneiders, MD 02/21/2019 4:55 PM

## 2019-02-21 NOTE — Telephone Encounter (Signed)
Pt just found out she is pregnant and is taking ENTYVIO. Her OBGYN advised to call us to make sure she can still continue to take it.

## 2019-02-21 NOTE — Progress Notes (Signed)
..   Jessica [name] presents today for UPT. She has no unusual complaints. LMP:01-14-19       OBJECTIVE: Appears well, in no apparent distress.  OB History    Gravida  4   Para  2   Term  2   Preterm      AB  1   Living  2     SAB  1   TAB      Ectopic      Multiple  0   Live Births  2          Home UPT Result:positive In-Office UPT result:Positive I have reviewed the patient's medical, obstetrical, social, and family histories, and medications.   ASSESSMENT: Positive pregnancy test  PLAN Prenatal care to be completed at: [facility]

## 2019-02-22 NOTE — Telephone Encounter (Signed)
Routed to Dr. Armbruster. 

## 2019-02-22 NOTE — Telephone Encounter (Signed)
Called and spoke to pt.  She is [redacted] weeks pregnant. Scheduled her for an April appt to see Dr. Loni Muse

## 2019-02-22 NOTE — Telephone Encounter (Signed)
Okay that's fine, thanks much

## 2019-02-22 NOTE — Telephone Encounter (Signed)
Thanks for letting me know. Yes she should continue taking it at this time. She needs a follow up appointment with me in clinic to discuss management of this medication during pregnancy and when to hold it prior to her delivery. Can you clarify how many weeks pregnant she is? Thanks

## 2019-02-22 NOTE — Telephone Encounter (Signed)
Dr. Havery Moros, please advise

## 2019-02-27 ENCOUNTER — Encounter (HOSPITAL_COMMUNITY): Payer: Medicaid Other

## 2019-03-28 ENCOUNTER — Ambulatory Visit: Payer: Medicaid Other | Admitting: Certified Nurse Midwife

## 2019-03-28 ENCOUNTER — Telehealth: Payer: Medicaid Other | Admitting: Physician Assistant

## 2019-03-28 ENCOUNTER — Other Ambulatory Visit: Payer: Self-pay

## 2019-03-28 ENCOUNTER — Encounter: Payer: Self-pay | Admitting: Certified Nurse Midwife

## 2019-03-28 VITALS — BP 124/84 | HR 97 | Temp 100.4°F | Wt 194.9 lb

## 2019-03-28 DIAGNOSIS — Z3480 Encounter for supervision of other normal pregnancy, unspecified trimester: Secondary | ICD-10-CM | POA: Insufficient documentation

## 2019-03-28 DIAGNOSIS — R509 Fever, unspecified: Secondary | ICD-10-CM

## 2019-03-28 MED ORDER — VITAFOL FE+ 90-1-200 & 50 MG PO CPPK: 1.0000 | ORAL_CAPSULE | Freq: Every day | ORAL | 3 refills | Status: DC

## 2019-03-28 NOTE — Progress Notes (Signed)
New OB.  Patient had a temperature of 100.4.  Patient was then given a mask and instructions to do an Evisit with [facility].  She is requesting PNV.

## 2019-03-28 NOTE — Progress Notes (Signed)
E-Visit for Corona Virus Screening  Based on your current symptoms, it seems unlikely that your symptoms are related to the Coronavirus.   Coronavirus disease 2019 (COVID-19) is a respiratory illness that can spread from person to person. The virus that causes COVID-19 is a new virus that was first identified in the country of [place] but is now found in multiple other countries and has spread to the [place].  Symptoms associated with the virus are mild to severe fever, cough, and shortness of breath. There is currently no vaccine to protect against COVID-19, and there is no specific antiviral treatment for the virus.   To be considered HIGH RISK for Coronavirus (COVID-19), you have to meet the following criteria:  . Traveled to [place], [place], [place], [place] or [place]; or in the [place] to [place], [place], [place], or [place]; and have fever, cough, and shortness of breath within the last 2 weeks of travel OR  . Been in close contact with a person diagnosed with COVID-19 within the last 2 weeks and have fever, cough, and shortness of breath  . IF YOU DO NOT MEET THESE CRITERIA, YOU ARE CONSIDERED LOW RISK FOR COVID-19.   It is vitally important that if you feel that you have an infection such as this virus or any other virus that you stay home and away from places where you may spread it to others.  You should self-quarantine for 14 days if you have symptoms that could potentially be coronavirus and avoid contact with people age 68 and older.   You may take acetaminophen (Tylenol) as needed for fever.   Reduce your risk of any infection by using the same precautions used for avoiding the common cold or flu:  Marland Kitchen Wash your hands often with soap and warm water for at least 20 seconds.  If soap and water are not readily available, use an alcohol-based hand sanitizer with at least 60% alcohol.  . If coughing or sneezing, cover your mouth and nose by coughing or sneezing into the  elbow areas of your shirt or coat, into a tissue or into your sleeve (not your hands). . Avoid shaking hands with others and consider head nods or verbal greetings only. . Avoid touching your eyes, nose, or mouth with unwashed hands.  . Avoid close contact with people who are sick. . Avoid places or events with large numbers of people in one location, like concerts or sporting events. . Carefully consider travel plans you have or are making. . If you are planning any travel outside or inside the [place], visit the CDC's Travelers' Health webpage for the latest health notices. . If you have some symptoms but not all symptoms, continue to monitor at home and seek medical attention if your symptoms worsen. . If you are having a medical emergency, call 911.  HOME CARE . Only take medications as instructed by your medical team. . Drink plenty of fluids and get plenty of rest. . A steam or ultrasonic humidifier can help if you have congestion.   GET HELP RIGHT AWAY IF: . You develop worsening fever. . You become short of breath . You cough up blood. . Your symptoms become more severe MAKE SURE YOU   Understand these instructions.  Will watch your condition.  Will get help right away if you are not doing well or get worse.  Your e-visit answers were reviewed by a board certified advanced clinical practitioner to complete your personal care  plan.  Depending on the condition, your plan could have included both over the counter or prescription medications.  If there is a problem please reply once you have received a response from your provider. Your safety is important to [place].  If you have drug allergies check your prescription carefully.    You can use MyChart to ask questions about today's visit, request a non-urgent call back, or ask for a work or school excuse for 24 hours related to this e-Visit. If it has been greater than 24 hours you will need to follow up with your provider, or enter a new  e-Visit to address those concerns. You will get an e-mail in the next two days asking about your experience.  I hope that your e-visit has been valuable and will speed your recovery. Thank you for using e-visits.

## 2019-03-28 NOTE — Progress Notes (Signed)
Rx for PNV sent to pharmacy on file. Patient not seen by provider due to fever. COVID19 precautions given. NOB appointment rescheduled.   Lajean Manes, CNM 03/28/19, 2:11 PM

## 2019-04-01 ENCOUNTER — Encounter: Payer: Medicaid Other | Admitting: Obstetrics

## 2019-04-03 ENCOUNTER — Encounter: Payer: Medicaid Other | Admitting: Obstetrics

## 2019-04-09 ENCOUNTER — Ambulatory Visit: Payer: Medicaid Other | Admitting: Gastroenterology

## 2019-04-11 NOTE — Progress Notes (Signed)
Prescreened pt for Monday appt with Dr. Havery Moros. Confirmed Doximity call

## 2019-04-15 ENCOUNTER — Ambulatory Visit (INDEPENDENT_AMBULATORY_CARE_PROVIDER_SITE_OTHER): Payer: Medicaid Other | Admitting: Gastroenterology

## 2019-04-15 ENCOUNTER — Other Ambulatory Visit: Payer: Self-pay

## 2019-04-15 DIAGNOSIS — K50819 Crohn's disease of both small and large intestine with unspecified complications: Secondary | ICD-10-CM | POA: Diagnosis not present

## 2019-04-15 NOTE — Progress Notes (Signed)
Virtual Visit via Video Note  I connected with Jessica [name] on 04/15/19 at  1:30 PM EDT by a video enabled telemedicine application and verified that I am speaking with the correct person using two identifiers.   I discussed the limitations of evaluation and management by telemedicine and the availability of in person appointments. The patient expressed understanding and agreed to proceed.  THIS ENCOUNTER IS A VIRTUAL VISIT DUE TO COVID-19 - PATIENT WAS NOT SEEN IN THE OFFICE. PATIENT HAS CONSENTED TO VIRTUAL VISIT / TELEMEDICINE VISIT via [place] APP   Location of patient: home Location of provider: office Persons participating: myself, patient  HPI :  Crohn's history History of Crohns diagnosed in 2012. Ileocolonic disease. She had a surgery with ileal resection with divertiing ileostomy at age 66 for emergency surgery, she thinks due to a perforation although details of this and indication for it are unclear. She was hospitalized for diversion colitis in 2015. Ileostomy takedown was done in 2016. She had a post-operative colonoscopy in February 2016 showing mildly active disease, and was placed on Humira in March 2016. She developed antibody on this regimen, Humira was increased and started on 6MP. The antibody level went away but Humira level remained low despite optimal dosing. Started on [facility] November 2018.   SINCE LAST VISIT:  Previously she had some recurrent symptoms. We obtained Humira levels1/30/18 - level of 1.1, with antibody level of 79.Recommended increasing Hunmira to weekly and adding thiopurine - 6MP 39m once daily. Her antibody level resolved however despite optimal dosing her level remained at 1.3. This was stopped and [facility]     She is here for a follow up visit. She is [redacted] weeks pregnant, her third pregnancy.  She reports her Crohn's has been well controlled on the Entyvio. Her last dose of the Entyvio was about a month  ago, she is on every 8 weeks dosing. No diarrhea. No abdominal pain. No blood in the stools. She is eating okay. She is off 6MP. She is taking prenatal vitamin. She is not taking any vitamin D supplements at this time, previously was on high dose supplementation. Pregnancy going well. Some nausea at times but eating well. This is her third pregnancy, both prior vaginal deliveries. No flares of disease or hospitalization.  Last colonoscopy 07/2018 as below, showed no active disease.   Prior endoscopic evaluation: Colonoscopy 08/14/2018 - normal ileum, normal anastomosis in the ascending colon, no inflammation Colonoscopy 2/16 - patent anastomosis with some inflammatory changes, ileum with some ulcerations EGD 2/16 - normal  Healthcare maintenance: Shehaspreviously declined flu shot/ pneumovax Vitamin D level of 9.9 on 01/11/17 TB testing negative 2/19    Past Medical History:  Diagnosis Date   Anemia    Crohn's disease ([place]    Osteoporosis    Vitamin D deficiency      Past Surgical History:  Procedure Laterality Date   COLONOSCOPY N/A 01/22/2015   Procedure: COLONOSCOPY;  Surgeon: DLafayette Dragon MD;  [facility];  Service: Endoscopy;  Laterality: N/A;   DILATION AND EVACUATION N/A 10/31/2014   Procedure: DILATATION AND EVACUATION;  Surgeon: LLahoma Crocker MD;  [facility];  Service: Gynecology;  Laterality: N/A;   ESOPHAGOGASTRODUODENOSCOPY N/A 01/22/2015   Procedure: ESOPHAGOGASTRODUODENOSCOPY (EGD);  Surgeon: DLafayette Dragon MD;  [facility];  Service: Endoscopy;  Laterality: N/A;   FLEXIBLE SIGMOIDOSCOPY N/A 11/04/2014   Procedure: FLEXIBLE SIGMOIDOSCOPY;  Surgeon: DLafayette Dragon MD;  [facility];  Service: Endoscopy;  Laterality: N/A;   ILEOSTOMY CLOSURE N/A 12/18/2014   Procedure: LOOP ILEOSTOMY REVERSAL;  Surgeon: [name], MD;  [facility];  Service: General;  Laterality: N/A;   INCISION AND DRAINAGE Left    arm     SMALL INTESTINE SURGERY     WISDOM TOOTH EXTRACTION     Family History  Problem Relation Age of Onset   Diabetes Father    Diabetes Maternal Grandmother    Heart disease Maternal Grandmother    Hypertension Maternal Grandmother    Kidney disease Maternal Grandmother    Lung cancer Maternal Grandfather    Hyperlipidemia Maternal Grandfather    Stroke Neg Hx    Colon cancer Neg Hx    Social History   Tobacco Use   Smoking status: Never Smoker   Smokeless tobacco: Never Used   Tobacco comment: occasional cigarette  Substance Use Topics   Alcohol use: No    Alcohol/week: 0.0 standard drinks   Drug use: No   Current Outpatient Medications  Medication Sig Dispense Refill   acetaminophen (TYLENOL) 500 MG tablet Take 1,000 mg by mouth every 6 (six) hours as needed for headache (pain).     dicyclomine (BENTYL) 10 MG capsule Take 1 capsule (10 mg total) by mouth every 8 (eight) hours as needed for spasms. (Patient not taking: Reported on 04/11/2019) 30 capsule 3   ondansetron (ZOFRAN ODT) 4 MG disintegrating tablet Take 1 tablet (4 mg total) by mouth every 6 (six) hours as needed for nausea or vomiting. (Patient not taking: Reported on 04/11/2019) 30 tablet 3   Prenat-FePoly-Metf-FA-DHA-DSS (VITAFOL FE+) 90-1-200 & 50 MG CPPK Take 1 tablet by mouth daily. 60 each 3   vedolizumab (ENTYVIO) 300 MG injection Inject 300 mg into the vein every 6 (six) weeks.      Vitamin D, Ergocalciferol, (DRISDOL) 50000 units CAPS capsule Take 1 capsule (50,000 Units total) by mouth every 7 (seven) days. 6 capsule 0   Current Facility-Administered Medications  Medication Dose Route Frequency Provider Last Rate Last Dose   vedolizumab (ENTYVIO) 300 mg in sodium chloride 0.9 % 250 mL infusion  300 mg Intravenous Once Giannis Corpuz, Carlota Raspberry, MD       No Known Allergies   Review of Systems: All systems reviewed and negative except where noted in HPI.   Lab Results  Component Value  Date   WBC 6.5 08/01/2018   HGB 11.9 (L) 08/01/2018   HCT 40.1 08/01/2018   MCV 87.6 08/01/2018   PLT 300 08/01/2018    Lab Results  Component Value Date   CREATININE 0.61 08/01/2018   BUN 9 08/01/2018   NA 138 08/01/2018   K 4.0 08/01/2018   CL 105 08/01/2018   CO2 28 08/01/2018    Lab Results  Component Value Date   ALT 16 08/01/2018   AST 20 08/01/2018   ALKPHOS 68 08/01/2018   BILITOT 0.4 08/01/2018     Physical Exam: LMP 01/14/2019  NA   ASSESSMENT AND PLAN:  23 y/o female here for reassessment of the following issues:  Crohn's disease / pregnancy - history of perforating ileocolonic Crohn's, previously developed antibodies and failed dose optimization of Humira, on Entyvio since 2018 and doing really well. She is tolerating the medication well, clinically appears in remission. She has since become pregnant since our last visit. We discussed pregnancy in Crohn's disease in general, and the use of Entyvio in pregnancy. Active Crohn's can lead to premature  birth and low birth weight. Safest thing for the baby is for the Crohn's to be in remission during pregnancy, and recommend continuing Entyvio throughout the pregnancy, studies show it is safe. However, experts recommend last dose of Entyvio to be planned 6-10 weeks prior to delivery. She has had delivery of her first 2 children at 38 weeks, may aim to give her last dose around week 31 or 32, and resume post-partum. She is due for basic labs - CBC, CMET, vitamin D, and quantiferon gold, I asked her to go to the lab at her convenience for testing. Otherwise she should avoid all NSAIDs which has been doing. Once post partum will resume therapy and may check fecal calprotectin to ensure she remains in remission. She agreed with the plan and will contact me in the interim with any changes in her status or questions during pregnancy.   [place] Cellar, MD [facility]

## 2019-04-15 NOTE — Patient Instructions (Signed)
If you are age 25 or older, your body mass index should be between 23-30. Your There is no height or weight on file to calculate BMI. If this is out of the aforementioned range listed, please consider follow up with your Primary Care Provider.  If you are age 76 or younger, your body mass index should be between 19-25. Your There is no height or weight on file to calculate BMI. If this is out of the aformentioned range listed, please consider follow up with your Primary Care Provider.   To help prevent the possible spread of infection to our patients, communities, and staff; we will be implementing the following measures:  As of now we are not allowing any visitors/family members to accompany you to any upcoming appointments with [facility]. If you have any concerns about this please contact our office to discuss prior to the appointment.   Please go to the lab in the basement of our building to have lab work done. Hit "B" for basement when you get on the elevator.  When the doors open the lab is on your left.  We will call you with the results. Thank you.  Thank you for entrusting me with your care and for choosing [facility] Dr.  Cellar

## 2019-04-18 ENCOUNTER — Other Ambulatory Visit (HOSPITAL_COMMUNITY)
Admission: RE | Admit: 2019-04-18 | Discharge: 2019-04-18 | Disposition: A | Discharge status: Home / Self Care | Payer: Medicaid Other | Source: Ambulatory Visit | Attending: Obstetrics | Admitting: Obstetrics

## 2019-04-18 ENCOUNTER — Ambulatory Visit (INDEPENDENT_AMBULATORY_CARE_PROVIDER_SITE_OTHER): Payer: Medicaid Other | Admitting: Obstetrics and Gynecology

## 2019-04-18 ENCOUNTER — Encounter: Payer: Self-pay | Admitting: Obstetrics and Gynecology

## 2019-04-18 ENCOUNTER — Other Ambulatory Visit: Payer: Self-pay

## 2019-04-18 VITALS — BP 131/79 | HR 108 | Temp 98.4°F | Wt 192.9 lb

## 2019-04-18 DIAGNOSIS — Z3481 Encounter for supervision of other normal pregnancy, first trimester: Secondary | ICD-10-CM | POA: Diagnosis not present

## 2019-04-18 DIAGNOSIS — Z315 Encounter for genetic counseling: Secondary | ICD-10-CM | POA: Diagnosis not present

## 2019-04-18 DIAGNOSIS — Z8759 Personal history of other complications of pregnancy, childbirth and the puerperium: Secondary | ICD-10-CM

## 2019-04-18 DIAGNOSIS — Z3480 Encounter for supervision of other normal pregnancy, unspecified trimester: Secondary | ICD-10-CM | POA: Diagnosis not present

## 2019-04-18 DIAGNOSIS — K50918 Crohn's disease, unspecified, with other complication: Secondary | ICD-10-CM

## 2019-04-18 DIAGNOSIS — Z3A13 13 weeks gestation of pregnancy: Secondary | ICD-10-CM

## 2019-04-18 MED ORDER — DOXYLAMINE-PYRIDOXINE 10-10 MG PO TBEC: 2.0000 | DELAYED_RELEASE_TABLET | Freq: Every day | ORAL | 5 refills | Status: DC

## 2019-04-18 MED ORDER — ASPIRIN EC 81 MG PO TBEC: 81.0000 mg | DELAYED_RELEASE_TABLET | Freq: Every day | ORAL | 2 refills | Status: DC

## 2019-04-18 NOTE — Progress Notes (Signed)
Patient is in the office for NOB, reports occasional pressure. Pt states that she has not started feeling fetal movement yet.

## 2019-04-18 NOTE — Progress Notes (Signed)
INITIAL PRENATAL VISIT NOTE  Subjective:  Jessica [name] is a 23 y.o. [id number] at 76w3dby LMP being seen today for her initial prenatal visit. This is an unplanned pregnancy. She and partner are happy with the pregnancy. She was using nothing for birth control previously. She has an obstetric history significant for 2 x SVD, induced FT for gHTN. She has a medical history significant for Crohn's.  Patient reports nausea and some vaginal pressure.  Contractions: Not present. Vag. Bleeding: None.   . Denies leaking of fluid.    Past Medical History:  Diagnosis Date   Anemia    Crohn's disease ([place]    Osteoporosis    Vitamin D deficiency     Past Surgical History:  Procedure Laterality Date   COLONOSCOPY N/A 01/22/2015   Procedure: COLONOSCOPY;  Surgeon: DLafayette Dragon MD;  [facility];  Service: Endoscopy;  Laterality: N/A;   DILATION AND EVACUATION N/A 10/31/2014   Procedure: DILATATION AND EVACUATION;  Surgeon: LLahoma Crocker MD;  [facility];  Service: Gynecology;  Laterality: N/A;   ESOPHAGOGASTRODUODENOSCOPY N/A 01/22/2015   Procedure: ESOPHAGOGASTRODUODENOSCOPY (EGD);  Surgeon: DLafayette Dragon MD;  [facility];  Service: Endoscopy;  Laterality: N/A;   FLEXIBLE SIGMOIDOSCOPY N/A 11/04/2014   Procedure: FLEXIBLE SIGMOIDOSCOPY;  Surgeon: DLafayette Dragon MD;  [facility];  Service: Endoscopy;  Laterality: N/A;   ILEOSTOMY CLOSURE N/A 12/18/2014   Procedure: LOOP ILEOSTOMY REVERSAL;  Surgeon: ALeighton Ruff MD;  [facility];  Service: General;  Laterality: N/A;   INCISION AND DRAINAGE Left    arm   SMALL INTESTINE SURGERY     WISDOM TOOTH EXTRACTION      OB History  Gravida Para Term Preterm AB Living  4 2 2   1 2   SAB TAB Ectopic Multiple Live Births  1     0 2    # Outcome Date GA Lbr Len/2nd Weight Sex Delivery Anes PTL Lv  4 Current           3 Term 05/04/16 39w2d8:17 / 00:10 6 lb 10.4 oz (3.015 kg) F Vag-Spont  EPI  LIV     Birth Comments: none  2 SAB 11/2014 6w57w0d SAB     1 Term 07/16/13 38w52w4d0:25 6 lb 5.8 oz (2.885 kg) M Vag-Spont EPI  LIV    Social History   Socioeconomic History   Marital status: Single    Spouse name: Not on file   Number of children: 1   Years of education: Not on file   Highest education level: Not on file  Occupational History   Occupation: homemaker  Social NeedDesigner, fashion/clothingain: Not on file   Food insecurity:    Worry: Not on file    Inability: Not on file   Transportation needs:    Medical: Not on file    Non-medical: Not on file  Tobacco Use   Smoking status: Never Smoker   Smokeless tobacco: Never Used   Tobacco comment: occasional cigarette  Substance and Sexual Activity   Alcohol use: No    Alcohol/week: 0.0 standard drinks   Drug use: No   Sexual activity: Yes    Partners: Male  Lifestyle   Physical activity:    Days per week: Not on file    Minutes per session: Not on file   Stress: Not on file  Relationships   Social connections:    Talks on  phone: Not on file    Gets together: Not on file    Attends religious service: Not on file    Active member of club or organization: Not on file    Attends meetings of clubs or organizations: Not on file    Relationship status: Not on file  Other Topics Concern   Not on file  Social History Narrative   Not on file    Family History  Problem Relation Age of Onset   Diabetes Father    Diabetes Maternal Grandmother    Heart disease Maternal Grandmother    Hypertension Maternal Grandmother    Kidney disease Maternal Grandmother    Lung cancer Maternal Grandfather    Hyperlipidemia Maternal Grandfather    Stroke Neg Hx    Colon cancer Neg Hx      Current Outpatient Medications:    Prenat-FePoly-Metf-FA-DHA-DSS (VITAFOL FE+) 90-1-200 & 50 MG CPPK, Take 1 tablet by mouth daily., Disp: 60 each, Rfl: 3   vedolizumab (ENTYVIO) 300 MG injection,  Inject 300 mg into the vein every 6 (six) weeks. , Disp: , Rfl:    acetaminophen (TYLENOL) 500 MG tablet, Take 1,000 mg by mouth every 6 (six) hours as needed for headache (pain)., Disp: , Rfl:    dicyclomine (BENTYL) 10 MG capsule, Take 1 capsule (10 mg total) by mouth every 8 (eight) hours as needed for spasms. (Patient not taking: Reported on 04/11/2019), Disp: 30 capsule, Rfl: 3   Doxylamine-Pyridoxine (DICLEGIS) 10-10 MG TBEC, Take 2 tablets by mouth at bedtime. If symptoms persist, add one tablet in the morning and one in the afternoon, Disp: 100 tablet, Rfl: 5   ondansetron (ZOFRAN ODT) 4 MG disintegrating tablet, Take 1 tablet (4 mg total) by mouth every 6 (six) hours as needed for nausea or vomiting. (Patient not taking: Reported on 04/11/2019), Disp: 30 tablet, Rfl: 3   Vitamin D, Ergocalciferol, (DRISDOL) 50000 units CAPS capsule, Take 1 capsule (50,000 Units total) by mouth every 7 (seven) days. (Patient not taking: Reported on 04/18/2019), Disp: 6 capsule, Rfl: 0  Current Facility-Administered Medications:    vedolizumab (ENTYVIO) 300 mg in sodium chloride 0.9 % 250 mL infusion, 300 mg, Intravenous, Once, [place], Carlota Raspberry, MD  No Known Allergies  Review of Systems: Negative except for what is mentioned in HPI.  Objective:   Vitals:   04/18/19 1006  BP: 131/79  Pulse: (!) 108  Temp: 98.4 F (36.9 C)  Weight: 192 lb 14.4 oz (87.5 kg)    Fetal Status: Fetal Heart Rate (bpm): 154         Physical Exam: BP 131/79    Pulse (!) 108    Temp 98.4 F (36.9 C)    Wt 192 lb 14.4 oz (87.5 kg)    LMP 01/14/2019    BMI 31.13 kg/m  CONSTITUTIONAL: Well-developed, well-nourished female in no acute distress.  NEUROLOGIC: Alert and oriented to person, place, and time. Normal reflexes, muscle tone coordination. No cranial nerve deficit noted. PSYCHIATRIC: Normal mood and affect. Normal behavior. Normal judgment and thought content. SKIN: Skin is warm and dry. No rash noted. Not  diaphoretic. No erythema. No pallor. HENT:  Normocephalic, atraumatic, External right and left ear normal. Oropharynx is clear and moist EYES: Conjunctivae and EOM are normal. Pupils are equal, round, and reactive to light. No scleral icterus.  NECK: Normal range of motion, supple, no masses CARDIOVASCULAR: Normal heart rate noted, regular rhythm RESPIRATORY: Effort and breath sounds normal, no problems with respiration noted  BREASTS: symmetric, non-tender, no masses palpable ABDOMEN: Soft, nontender, nondistended, gravid. Multiple well healed scars from prior intestinal surgery GU: normal appearing external female genitalia, multiparous normal appearing cervix, scant white discharge in vagina, no lesions noted Bimanual: 14 weeks sized uterus, no adnexal tenderness or palpable lesions noted MUSCULOSKELETAL: Normal range of motion. EXT:  No edema and no tenderness. 2+ distal pulses.   Assessment and Plan:  Pregnancy: [id number] at 19w3dby LMP  1. Supervision of other normal pregnancy, antepartum - Cytology - PAP( Rachel) - Cervicovaginal ancillary only( ) - Culture, OB Urine - Obstetric Panel, Including HIV - Genetic Screening - Babyscripts Schedule Optimization Reviewed office, multiple providers, telehealth visits, answered all questions  2. History of gestational hypertension Cannot take ASA due to Crohn's  3. Crohn's disease with other complication, unspecified gastrointestinal tract location ([facility] Under control on entyvia No issues currently S/p ostomy  Preterm labor symptoms and general obstetric precautions including but not limited to vaginal bleeding, contractions, leaking of fluid and fetal movement were reviewed in detail with the patient.  Please refer to After Visit Summary for other counseling recommendations.   Return in about 4 weeks (around 05/16/2019) for OB visit (MD).  KSloan Leiter4/30/2020 10:32 AM

## 2019-04-19 LAB — CERVICOVAGINAL ANCILLARY ONLY
Chlamydia: NEGATIVE
Neisseria Gonorrhea: NEGATIVE

## 2019-04-19 LAB — PROTEIN / CREATININE RATIO, URINE
Creatinine, Urine: 303.1 mg/dL
Protein, Ur: 53.3 mg/dL
Protein/Creat Ratio: 176 mg/g creat (ref 0–200)

## 2019-04-20 LAB — COMPREHENSIVE METABOLIC PANEL
ALT: 11 IU/L (ref 0–32)
AST: 17 IU/L (ref 0–40)
Albumin/Globulin Ratio: 1.4 (ref 1.2–2.2)
Albumin: 4.1 g/dL (ref 3.9–5.0)
Alkaline Phosphatase: 52 IU/L (ref 39–117)
BUN/Creatinine Ratio: 21 (ref 9–23)
BUN: 8 mg/dL (ref 6–20)
Bilirubin Total: <0.2 mg/dL (ref 0.0–1.2)
CO2: 17 mmol/L — ABNORMAL LOW (ref 20–29)
Calcium: 9.3 mg/dL (ref 8.7–10.2)
Chloride: 103 mmol/L (ref 96–106)
Creatinine, Ser: 0.39 mg/dL — ABNORMAL LOW (ref 0.57–1.00)
GFR calc Af Amer: 170 mL/min/{1.73_m2} (ref >59)
GFR calc non Af Amer: 147 mL/min/{1.73_m2} (ref >59)
Globulin, Total: 3 g/dL (ref 1.5–4.5)
Glucose: 87 mg/dL (ref 65–99)
Potassium: 4.3 mmol/L (ref 3.5–5.2)
Sodium: 136 mmol/L (ref 134–144)
Total Protein: 7.1 g/dL (ref 6.0–8.5)

## 2019-04-20 LAB — CULTURE, OB URINE

## 2019-04-20 LAB — OBSTETRIC PANEL, INCLUDING HIV
Antibody Screen: NEGATIVE
Basophils Absolute: 0 10*3/uL (ref 0.0–0.2)
Basos: 0 %
EOS (ABSOLUTE): 0 10*3/uL (ref 0.0–0.4)
Eos: 0 %
HIV Screen 4th Generation wRfx: NONREACTIVE
Hematocrit: 37.7 % (ref 34.0–46.6)
Hemoglobin: 12.3 g/dL (ref 11.1–15.9)
Hepatitis B Surface Ag: NEGATIVE
Immature Grans (Abs): 0 10*3/uL (ref 0.0–0.1)
Immature Granulocytes: 1 %
Lymphocytes Absolute: 1.4 10*3/uL (ref 0.7–3.1)
Lymphs: 18 %
MCH: 28.3 pg (ref 26.6–33.0)
MCHC: 32.6 g/dL (ref 31.5–35.7)
MCV: 87 fL (ref 79–97)
Monocytes Absolute: 0.6 10*3/uL (ref 0.1–0.9)
Monocytes: 7 %
Neutrophils Absolute: 6.1 10*3/uL (ref 1.4–7.0)
Neutrophils: 74 %
Platelets: 255 10*3/uL (ref 150–450)
RBC: 4.35 x10E6/uL (ref 3.77–5.28)
RDW: 14.2 % (ref 11.7–15.4)
RPR Ser Ql: NONREACTIVE
Rh Factor: POSITIVE
Rubella Antibodies, IGG: 0.96 index — ABNORMAL LOW (ref >0.99)
WBC: 8.1 10*3/uL (ref 3.4–10.8)

## 2019-04-20 LAB — QUANTIFERON-TB GOLD PLUS
QuantiFERON Mitogen Value: 4.03 IU/mL
QuantiFERON Nil Value: 0.01 IU/mL
QuantiFERON TB1 Ag Value: 0.01 IU/mL
QuantiFERON TB2 Ag Value: 0.01 IU/mL
QuantiFERON-TB Gold Plus: NEGATIVE

## 2019-04-20 LAB — URINE CULTURE, OB REFLEX

## 2019-04-20 LAB — VITAMIN D 25 HYDROXY (VIT D DEFICIENCY, FRACTURES): Vit D, 25-Hydroxy: 16.5 ng/mL — ABNORMAL LOW (ref 30.0–100.0)

## 2019-04-22 ENCOUNTER — Encounter: Payer: Self-pay | Admitting: Obstetrics and Gynecology

## 2019-04-22 DIAGNOSIS — Z2839 Other underimmunization status: Secondary | ICD-10-CM | POA: Insufficient documentation

## 2019-04-22 DIAGNOSIS — O9989 Other specified diseases and conditions complicating pregnancy, childbirth and the puerperium: Secondary | ICD-10-CM

## 2019-04-22 DIAGNOSIS — Z283 Underimmunization status: Secondary | ICD-10-CM | POA: Insufficient documentation

## 2019-04-22 LAB — CYTOLOGY - PAP: Diagnosis: NEGATIVE

## 2019-04-23 ENCOUNTER — Telehealth: Payer: Self-pay | Admitting: *Deleted

## 2019-04-23 NOTE — Telephone Encounter (Signed)
Pt called to office stating she has lost her card for her Panorama testing- to log onto [employer].  Return call to pt.  Pt was given [place] name and her case ID in order to try to set up acct on their website. Pt advised to call office in several days if she is unable to get on for results.

## 2019-04-25 ENCOUNTER — Encounter: Payer: Self-pay | Admitting: Obstetrics and Gynecology

## 2019-04-29 ENCOUNTER — Encounter: Payer: Self-pay | Admitting: Obstetrics and Gynecology

## 2019-04-29 ENCOUNTER — Telehealth: Payer: Self-pay | Admitting: *Deleted

## 2019-04-29 NOTE — Telephone Encounter (Signed)
Pt made aware of Panorama and Horizon results, including sex.

## 2019-05-06 ENCOUNTER — Telehealth: Payer: Self-pay | Admitting: *Deleted

## 2019-05-06 NOTE — Telephone Encounter (Signed)
Pt called to office with BP concerns. Return call to pt. Pt made aware her BP is currently WNL. Pt made aware that BP will continue to be monitored throughout pregnancy. Pt advised she may call with any concerns.

## 2019-05-16 ENCOUNTER — Encounter: Payer: Self-pay | Admitting: Certified Nurse Midwife

## 2019-05-16 ENCOUNTER — Other Ambulatory Visit: Payer: Self-pay

## 2019-05-16 ENCOUNTER — Ambulatory Visit (INDEPENDENT_AMBULATORY_CARE_PROVIDER_SITE_OTHER): Payer: Medicaid Other | Admitting: Certified Nurse Midwife

## 2019-05-16 VITALS — BP 106/85 | Wt 193.0 lb

## 2019-05-16 DIAGNOSIS — Z3480 Encounter for supervision of other normal pregnancy, unspecified trimester: Secondary | ICD-10-CM

## 2019-05-16 DIAGNOSIS — Z3A17 17 weeks gestation of pregnancy: Secondary | ICD-10-CM

## 2019-05-16 DIAGNOSIS — Z3482 Encounter for supervision of other normal pregnancy, second trimester: Secondary | ICD-10-CM

## 2019-05-16 DIAGNOSIS — O99891 Other specified diseases and conditions complicating pregnancy: Secondary | ICD-10-CM

## 2019-05-16 DIAGNOSIS — Z283 Underimmunization status: Secondary | ICD-10-CM

## 2019-05-16 DIAGNOSIS — Z2839 Other underimmunization status: Secondary | ICD-10-CM

## 2019-05-16 DIAGNOSIS — O9989 Other specified diseases and conditions complicating pregnancy, childbirth and the puerperium: Secondary | ICD-10-CM

## 2019-05-16 DIAGNOSIS — Z8759 Personal history of other complications of pregnancy, childbirth and the puerperium: Secondary | ICD-10-CM

## 2019-05-16 MED ORDER — VITAFOL FE+ 90-1-200 & 50 MG PO CPPK: 1.0000 | ORAL_CAPSULE | Freq: Every day | ORAL | 3 refills | Status: DC

## 2019-05-16 MED ORDER — VITAMIN D (ERGOCALCIFEROL) 1.25 MG (50000 UNIT) PO CAPS: 50000.0000 [IU] | ORAL_CAPSULE | ORAL | 0 refills | Status: DC

## 2019-05-16 NOTE — Progress Notes (Signed)
Pt is on the phone preparing for virtual visit with provider. [redacted]w[redacted]d

## 2019-05-16 NOTE — Progress Notes (Signed)
[place] VIRTUAL VIDEO VISIT ENCOUNTER NOTE  Provider location: [facility] [place] [facility] [place]   I connected with Jessica [name] on 05/16/19 at 1:25 PM EDT by WebEx Video Encounter at home and verified that I am speaking with the correct person using two identifiers.   I discussed the limitations, risks, security and privacy concerns of performing an evaluation and management service by telephone and the availability of in person appointments. I also discussed with the patient that there may be a patient responsible charge related to this service. The patient expressed understanding and agreed to proceed. Subjective:  Jessica [name] is a 23 y.o. [phone] at 47w3dbeing seen today for ongoing prenatal care.  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  Patient reports no complaints.  Contractions: Not present. Vag. Bleeding: None.  Movement: Present. Denies any leaking of fluid.   The following portions of the patient's history were reviewed and updated as appropriate: allergies, current medications, past family history, past medical history, past social history, past surgical history and problem list.   Objective:   Vitals:   05/16/19 1319  BP: 106/85  Weight: 193 lb (87.5 kg)    Fetal Status:     Movement: Present      General:  Alert, oriented and cooperative. Patient is in no acute distress.  Respiratory: Normal respiratory effort, no problems with respiration noted  Mental Status: Normal mood and affect. Normal behavior. Normal judgment and thought content.  Rest of physical exam deferred due to type of encounter  Imaging: No results found.  Assessment and Plan:  Pregnancy: [id number] 112w3d. Supervision of other normal pregnancy, antepartum - Patient doing well, no complaints request refill of vitamin D supplement and PNV  - Anticipatory guidance on upcoming appointments with AFP at next appointment  - Vitamin D, Ergocalciferol, (DRISDOL) 1.25 MG (50000 UT) CAPS capsule; Take 1 capsule (50,000 Units total) by mouth every 7 (seven) days.  Dispense: 6 capsule; Refill: 0 - Prenat-FePoly-Metf-FA-DHA-DSS (VITAFOL FE+) 90-1-200 & 50 MG CPPK; Take 1 tablet by mouth daily.  Dispense: 60 each; Refill: 3  2. History of gestational hypertension - BP stable at this time  - Continue to monitor closely, can not take aspirin due to Crohn's   3. Rubella non-immune status, antepartum - MMR PP  - Will repeat rubella screening at next appointment with AFP  Preterm labor symptoms and general obstetric precautions including but not limited to vaginal bleeding, contractions, leaking of fluid and fetal movement were reviewed in detail with the patient. I discussed the assessment and treatment plan with the patient. The patient was provided an opportunity to ask questions and all were answered. The patient agreed with the plan and demonstrated an understanding of the instructions. The patient was advised to call back or seek an in-person office evaluation/[facility] any [facility] or concerning symptoms. Please refer to After Visit Summary for other counseling recommendations.   I provided 10 minutes of  face-to-face time during this encounter.  Return in about 4 weeks (around 06/13/2019)  for ROB/AFP.  Future Appointments  Date Time Provider [place]  06/03/2019  1:15 PM [place] 4 [id number]-MFCUS MFC-US  06/13/2019  1:45 PM Lajean Manes, CNM [place] None    Lajean Manes, [place] for [employer], [place]

## 2019-05-20 ENCOUNTER — Telehealth: Payer: Self-pay

## 2019-05-20 ENCOUNTER — Other Ambulatory Visit: Payer: Self-pay | Admitting: Certified Nurse Midwife

## 2019-05-20 DIAGNOSIS — O219 Vomiting of pregnancy, unspecified: Secondary | ICD-10-CM

## 2019-05-20 MED ORDER — PROMETHAZINE HCL 12.5 MG PO TABS: 12.5000 mg | ORAL_TABLET | Freq: Four times a day (QID) | ORAL | 1 refills | Status: DC | PRN

## 2019-05-20 NOTE — Telephone Encounter (Signed)
TC from pt regarding  Discussing at last visit a different nausea medication due to the ones she have been prescribed are not working.  Please advise.

## 2019-06-03 ENCOUNTER — Other Ambulatory Visit (HOSPITAL_COMMUNITY): Payer: Self-pay | Admitting: *Deleted

## 2019-06-03 ENCOUNTER — Ambulatory Visit (HOSPITAL_COMMUNITY)
Admission: RE | Admit: 2019-06-03 | Discharge: 2019-06-03 | Disposition: A | Discharge status: Home / Self Care | Payer: Medicaid Other | Source: Ambulatory Visit | Attending: Obstetrics and Gynecology | Admitting: Obstetrics and Gynecology

## 2019-06-03 ENCOUNTER — Other Ambulatory Visit: Payer: Medicaid Other

## 2019-06-03 ENCOUNTER — Other Ambulatory Visit: Payer: Self-pay

## 2019-06-03 DIAGNOSIS — O09292 Supervision of pregnancy with other poor reproductive or obstetric history, second trimester: Secondary | ICD-10-CM | POA: Diagnosis not present

## 2019-06-03 DIAGNOSIS — Z3A20 20 weeks gestation of pregnancy: Secondary | ICD-10-CM | POA: Diagnosis not present

## 2019-06-03 DIAGNOSIS — O99612 Diseases of the digestive system complicating pregnancy, second trimester: Secondary | ICD-10-CM | POA: Diagnosis not present

## 2019-06-03 DIAGNOSIS — Z3480 Encounter for supervision of other normal pregnancy, unspecified trimester: Secondary | ICD-10-CM

## 2019-06-03 DIAGNOSIS — Z363 Encounter for antenatal screening for malformations: Secondary | ICD-10-CM | POA: Diagnosis not present

## 2019-06-03 DIAGNOSIS — Z362 Encounter for other antenatal screening follow-up: Secondary | ICD-10-CM

## 2019-06-13 ENCOUNTER — Telehealth (INDEPENDENT_AMBULATORY_CARE_PROVIDER_SITE_OTHER): Payer: Medicaid Other | Admitting: Certified Nurse Midwife

## 2019-06-13 ENCOUNTER — Encounter: Payer: Self-pay | Admitting: Certified Nurse Midwife

## 2019-06-13 ENCOUNTER — Other Ambulatory Visit: Payer: Self-pay

## 2019-06-13 VITALS — Wt 198.4 lb

## 2019-06-13 DIAGNOSIS — Z283 Underimmunization status: Secondary | ICD-10-CM

## 2019-06-13 DIAGNOSIS — O09899 Supervision of other high risk pregnancies, unspecified trimester: Secondary | ICD-10-CM

## 2019-06-13 DIAGNOSIS — Z8759 Personal history of other complications of pregnancy, childbirth and the puerperium: Secondary | ICD-10-CM

## 2019-06-13 DIAGNOSIS — Z3480 Encounter for supervision of other normal pregnancy, unspecified trimester: Secondary | ICD-10-CM

## 2019-06-13 DIAGNOSIS — K5090 Crohn's disease, unspecified, without complications: Secondary | ICD-10-CM

## 2019-06-13 DIAGNOSIS — Z2839 Other underimmunization status: Secondary | ICD-10-CM

## 2019-06-13 DIAGNOSIS — Z3A21 21 weeks gestation of pregnancy: Secondary | ICD-10-CM

## 2019-06-13 DIAGNOSIS — O9989 Other specified diseases and conditions complicating pregnancy, childbirth and the puerperium: Secondary | ICD-10-CM

## 2019-06-13 DIAGNOSIS — O99891 Other specified diseases and conditions complicating pregnancy: Secondary | ICD-10-CM

## 2019-06-13 DIAGNOSIS — O99612 Diseases of the digestive system complicating pregnancy, second trimester: Secondary | ICD-10-CM

## 2019-06-13 NOTE — Patient Instructions (Signed)
Glucose Tolerance Test During Pregnancy Why am I having this test? The glucose tolerance test (GTT) is done to check how your body processes sugar (glucose). This is one of several tests used to diagnose diabetes that develops during pregnancy (gestational diabetes mellitus). Gestational diabetes is a temporary form of diabetes that some women develop during pregnancy. It usually occurs during the second trimester of pregnancy and goes away after delivery. Testing (screening) for gestational diabetes usually occurs between 24 and 28 weeks of pregnancy. You may have the GTT test after having a 1-hour glucose screening test if the results from that test indicate that you may have gestational diabetes. You may also have this test if:  You have a history of gestational diabetes.  You have a history of giving birth to very large babies or have experienced repeated fetal loss (stillbirth).  You have signs and symptoms of diabetes, such as: ? Changes in your vision. ? Tingling or numbness in your hands or feet. ? Changes in hunger, thirst, and urination that are not otherwise explained by your pregnancy. What is being tested? This test measures the amount of glucose in your blood at different times during a period of 3 hours. This indicates how well your body is able to process glucose. What kind of sample is taken?  Blood samples are required for this test. They are usually collected by inserting a needle into a blood vessel. How do I prepare for this test?  For 3 days before your test, eat normally. Have plenty of carbohydrate-rich foods.  Follow instructions from your health care provider about: ? Eating or drinking restrictions on the day of the test. You may be asked to not eat or drink anything other than water (fast) starting 8-10 hours before the test. ? Changing or stopping your regular medicines. Some medicines may interfere with this test. Tell a health care provider about:  All  medicines you are taking, including vitamins, herbs, eye drops, creams, and over-the-counter medicines.  Any blood disorders you have.  Any surgeries you have had.  Any medical conditions you have. What happens during the test? First, your blood glucose will be measured. This is referred to as your fasting blood glucose, since you fasted before the test. Then, you will drink a glucose solution that contains a certain amount of glucose. Your blood glucose will be measured again 1, 2, and 3 hours after drinking the solution. This test takes about 3 hours to complete. You will need to stay at the testing location during this time. During the testing period:  Do not eat or drink anything other than the glucose solution.  Do not exercise.  Do not use any products that contain nicotine or tobacco, such as cigarettes and e-cigarettes. If you need help stopping, ask your health care provider. The testing procedure may vary among health care providers and hospitals. How are the results reported? Your results will be reported as milligrams of glucose per deciliter of blood (mg/dL) or millimoles per liter (mmol/L). Your health care provider will compare your results to normal ranges that were established after testing a large group of people (reference ranges). Reference ranges may vary among labs and hospitals. For this test, common reference ranges are:  Fasting: less than 95-105 mg/dL (5.3-5.8 mmol/L).  1 hour after drinking glucose: less than 180-190 mg/dL (10.0-10.5 mmol/L).  2 hours after drinking glucose: less than 155-165 mg/dL (8.6-9.2 mmol/L).  3 hours after drinking glucose: 140-145 mg/dL (7.8-8.1 mmol/L). What do the  results mean? Results within reference ranges are considered normal, meaning that your glucose levels are well-controlled. If two or more of your blood glucose levels are high, you may be diagnosed with gestational diabetes. If only one level is high, your health care  provider may suggest repeat testing or other tests to confirm a diagnosis. Talk with your health care provider about what your results mean. Questions to ask your health care provider Ask your health care provider, or the department that is doing the test:  When will my results be ready?  How will I get my results?  What are my treatment options?  What other tests do I need?  What are my next steps? Summary  The glucose tolerance test (GTT) is one of several tests used to diagnose diabetes that develops during pregnancy (gestational diabetes mellitus). Gestational diabetes is a temporary form of diabetes that some women develop during pregnancy.  You may have the GTT test after having a 1-hour glucose screening test if the results from that test indicate that you may have gestational diabetes. You may also have this test if you have any symptoms or risk factors for gestational diabetes.  Talk with your health care provider about what your results mean. This information is not intended to replace advice given to you by your health care provider. Make sure you discuss any questions you have with your health care provider. Document Released: 06/05/2012 Document Revised: 07/17/2017 Document Reviewed: 07/17/2017 Elsevier Interactive Patient Education  2019 [employer].

## 2019-06-13 NOTE — Progress Notes (Signed)
ROB with complaints of not sleeping well at night.

## 2019-06-13 NOTE — Progress Notes (Signed)
TELEHEALTH OBSTETRICS PRENATAL VIRTUAL VIDEO VISIT ENCOUNTER NOTE  Provider location: [facility] [place]   I connected with Jessica [name] on 06/13/19 at  1:45 PM EDT by MyChart Video Encounter at home and verified that I am speaking with the correct person using two identifiers.   I discussed the limitations, risks, security and privacy concerns of performing an evaluation and management service by telephone and the availability of in person appointments. I also discussed with the patient that there may be a patient responsible charge related to this service. The patient expressed understanding and agreed to proceed. Subjective:  Jessica [name] is a 23 y.o. [phone] at 69w3dbeing seen today for ongoing prenatal care.  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  Patient reports restlessness at night.  Contractions: Not present. Vag. Bleeding: None.  Movement: Present. Denies any leaking of fluid.   The following portions of the patient's history were reviewed and updated as appropriate: allergies, current medications, past family history, past medical history, past social history, past surgical history and problem list.   Objective:   Vitals:   06/13/19 1345  Weight: 198 lb 6.6 oz (90 kg)    Fetal Status:      Movement: Present     General:  Alert, oriented and cooperative. Patient is in no acute distress.  Respiratory: Normal respiratory effort, no problems with respiration noted  Mental Status: Normal mood and affect. Normal behavior. Normal judgment and thought content.  Rest of physical exam deferred due to type of encounter  Imaging: UK[place]Mfm Ob Detail +14 Wk  Result Date: 06/03/2019 ----------------------------------------------------------------------  OBSTETRICS REPORT                       (Signed Final 06/03/2019 03:28 pm) ---------------------------------------------------------------------- Patient Info  ID #:       [id number]                         D.O.B.:  109/05/1996(23 yrs)  Name:       Jessica [name]                 Visit Date: 06/03/2019 01:57 pm ---------------------------------------------------------------------- Performed By  Performed By:     CRodrigo RanBS      Ref. Address:     [address]                                                             [place]  Attending:        Tama High MD        [facility]  Referred By:      [facility] ---------------------------------------------------------------------- Orders   #  Description                          Code         Ordered By   1  [place] MFM OB DETAIL +14 Holly              76811.01     KELLY DAVIS  ----------------------------------------------------------------------   #  Order #                    Accession #                 Episode #   1  [id number]                  [id number]                  [id number]  ---------------------------------------------------------------------- Indications   [redacted] weeks gestation  of pregnancy                Z3A.20   Antenatal screening for malformations          Z36.3   Maternal Crohn's disease affecting             O99.612   pregnancy in second trimester [place]);   history of colectomy   Poor obstetric history: Previous gestational   O09.299   HTN  ---------------------------------------------------------------------- Fetal Evaluation  Num Of Fetuses:         1  Fetal Heart Rate(bpm):  150  Cardiac Activity:       Observed  Presentation:           Cephalic  Placenta:               Posterior  P. Cord Insertion:      Visualized  Amniotic Fluid  AFI FV:      Within normal limits                              Largest Pocket(cm)  5.6 ---------------------------------------------------------------------- Biometry  BPD:      45.7  mm     G. Age:  19w 6d         42  %    CI:        72.44   %    70 - 86                                                          FL/HC:      18.3   %    16.8 - 19.8  HC:      170.8  mm     G. Age:  19w 5d         28  %    HC/AC:      1.13        1.09 - 1.39  AC:      150.9  mm     G. Age:  20w 2d         55  %    FL/BPD:     68.5   %  FL:       31.3  mm     G. Age:  19w 5d         33  %    FL/AC:      20.7   %    20 - 24  HUM:      29.4  mm     G. Age:  19w 4d         43  %  CER:      20.3  mm     G. Age:  19w 2d         34  %  CM:        3.6  mm  Est. FW:     326  gm    0 lb 11 oz      50  % ---------------------------------------------------------------------- OB History  Gravidity:    4         Term:   2        Prem:   0        SAB:   1  TOP:          0       Ectopic:  0        Living: 2 ---------------------------------------------------------------------- Gestational Age  LMP:           20w 0d        Date:  01/14/19                 EDD:   10/21/19  U/S Today:     19w 6d                                        EDD:   10/22/19  Best:          Hyacinth Meeker 0d     Det. By:  LMP  (01/14/19)          EDD:   10/21/19  ---------------------------------------------------------------------- Anatomy  Cranium:               Appears normal  LVOT:                   Appears normal  Cavum:                 Appears normal         Aortic Arch:            Not well visualized  Ventricles:            Appears normal         Ductal Arch:            Appears normal  Choroid Plexus:        Appears normal         Diaphragm:              Appears normal  Cerebellum:            Appears normal         Stomach:                Appears normal, left                                                                        sided  Posterior Fossa:       Appears normal         Abdomen:                Appears normal  Nuchal Fold:           Appears normal         Abdominal Wall:         Appears nml (cord                                                                        insert, abd wall)  Face:                  Appears normal         Cord Vessels:           Appears normal (3                         (orbits and profile)                           vessel cord)  Lips:                  Appears normal         Kidneys:                Appear normal  Palate:                Not well visualized    Bladder:                Appears normal  Thoracic:  Appears normal         Spine:                  Appears normal  Heart:                 Appears normal         Upper Extremities:      Appears normal                         (4CH, axis, and                         situs)  RVOT:                  Not well visualized    Lower Extremities:      Appears normal  Other:  Heels and right 5th digit visualized. Nasal bone visualized. ---------------------------------------------------------------------- Cervix Uterus Adnexa  Cervix  Length:            3.7  cm.  Normal appearance by transabdominal scan.  Uterus  No abnormality visualized.  Left Ovary  Not visualized.  Right Ovary  Not visualized.  Cul De Sac  No free fluid seen.  Adnexa  No abnormality visualized.  ---------------------------------------------------------------------- Impression  We performed fetal anatomy scan. No makers of  aneuploidies or fetal structural defects are seen. Fetal  biometry is consistent with her previously-established dates.  Amniotic fluid is normal and good fetal activity is seen.  Patient understands the limitations of ultrasound in detecting  fetal anomalies.  On cell-free fetal DNA screening, the risks of fetal  aneuploidies are not increased.  Maternal Crohn's disease (stable). ---------------------------------------------------------------------- Recommendations  -An appointment was made for her to return in 4 weeks for  completion of fetal anatomy.  -Serial fetal growth every 4 weeks. ----------------------------------------------------------------------                  Jessica High, MD Electronically Signed Final Report   06/03/2019 03:28 pm ----------------------------------------------------------------------   Assessment and Plan:  Pregnancy: [id number] at 45w3d1. Supervision of other normal pregnancy, antepartum - Patient doing well other than difficulty sleeping at night, she denies pain being cause of difficulty but "just being restless", haven't been able to sleep well in the past week.  - Encouraged use of magnesium supplement 2557mdose at night for sleep, encouraged to call office next week if supplement is not helping. - Routine prenatal care - Anticipatory guidance on upcoming appointments with next one being GTT, discussed fasting prior to appointment, appointment scheduled for 8/6.   2. History of gestational hypertension - BP stable at this time  - BP 116/78 on 6/21 through babyscipts app  - Encouraged continue use of babyscripts app and discussed s/s of PEC  3. Rubella non-immune status, antepartum - Needs redraw at next in person appointment   4. Maternal Crohn's disease affecting pregnancy, antepartum (HCSnow[place]- Stable at this time - serial fetal  growth every 4 weeks, USKoreacheduled for 7/13   Preterm labor symptoms and general obstetric precautions including but not limited to vaginal bleeding, contractions, leaking of fluid and fetal movement were reviewed in detail with the patient. I discussed the assessment and treatment plan with the patient. The patient was provided an opportunity to ask questions and all were answered. The patient agreed with the plan and demonstrated an understanding of the instructions. The patient was advised to call  back or seek an in-person office evaluation/go to [facility] at [facility] for any urgent or concerning symptoms. Please refer to After Visit Summary for other counseling recommendations.   I provided 12 minutes of face-to-face time during this encounter.  Return in about 6 weeks (around 07/25/2019) for ROB/2hrGTT.  Future Appointments  Date Time Provider [place]  07/01/2019  2:00 PM [place] [facility]-US  07/01/2019  2:00 PM [place] 3 WH-MFCUS MFC-US  07/25/2019  8:45 AM [facility] None  07/25/2019  9:15 AM Lajean Manes, CNM [facility] None    Lajean Manes, [place] for [employer], [facility] [employer]

## 2019-07-01 ENCOUNTER — Encounter (HOSPITAL_COMMUNITY): Payer: Self-pay | Admitting: *Deleted

## 2019-07-01 ENCOUNTER — Ambulatory Visit (HOSPITAL_COMMUNITY): Payer: Medicaid Other | Admitting: *Deleted

## 2019-07-01 ENCOUNTER — Ambulatory Visit (HOSPITAL_COMMUNITY)
Admission: RE | Admit: 2019-07-01 | Discharge: 2019-07-01 | Disposition: A | Discharge status: Home / Self Care | Payer: Medicaid Other | Source: Ambulatory Visit | Attending: Obstetrics and Gynecology | Admitting: Obstetrics and Gynecology

## 2019-07-01 ENCOUNTER — Other Ambulatory Visit: Payer: Self-pay

## 2019-07-01 DIAGNOSIS — O99612 Diseases of the digestive system complicating pregnancy, second trimester: Secondary | ICD-10-CM | POA: Diagnosis not present

## 2019-07-01 DIAGNOSIS — Z8759 Personal history of other complications of pregnancy, childbirth and the puerperium: Secondary | ICD-10-CM

## 2019-07-01 DIAGNOSIS — O9989 Other specified diseases and conditions complicating pregnancy, childbirth and the puerperium: Secondary | ICD-10-CM | POA: Diagnosis not present

## 2019-07-01 DIAGNOSIS — Z362 Encounter for other antenatal screening follow-up: Secondary | ICD-10-CM | POA: Diagnosis not present

## 2019-07-01 DIAGNOSIS — Z283 Underimmunization status: Secondary | ICD-10-CM

## 2019-07-01 DIAGNOSIS — O09292 Supervision of pregnancy with other poor reproductive or obstetric history, second trimester: Secondary | ICD-10-CM

## 2019-07-01 DIAGNOSIS — Z3A24 24 weeks gestation of pregnancy: Secondary | ICD-10-CM

## 2019-07-01 DIAGNOSIS — Z3480 Encounter for supervision of other normal pregnancy, unspecified trimester: Secondary | ICD-10-CM | POA: Insufficient documentation

## 2019-07-01 DIAGNOSIS — Z2839 Other underimmunization status: Secondary | ICD-10-CM

## 2019-07-02 ENCOUNTER — Other Ambulatory Visit (HOSPITAL_COMMUNITY): Payer: Self-pay | Admitting: *Deleted

## 2019-07-02 DIAGNOSIS — K5090 Crohn's disease, unspecified, without complications: Secondary | ICD-10-CM

## 2019-07-02 DIAGNOSIS — O99613 Diseases of the digestive system complicating pregnancy, third trimester: Secondary | ICD-10-CM

## 2019-07-25 ENCOUNTER — Ambulatory Visit (INDEPENDENT_AMBULATORY_CARE_PROVIDER_SITE_OTHER): Payer: Medicaid Other | Admitting: Certified Nurse Midwife

## 2019-07-25 ENCOUNTER — Other Ambulatory Visit: Payer: Medicaid Other

## 2019-07-25 ENCOUNTER — Encounter: Payer: Self-pay | Admitting: Certified Nurse Midwife

## 2019-07-25 ENCOUNTER — Other Ambulatory Visit: Payer: Self-pay

## 2019-07-25 VITALS — BP 115/82 | HR 99 | Temp 98.2°F | Wt 200.0 lb

## 2019-07-25 DIAGNOSIS — Z23 Encounter for immunization: Secondary | ICD-10-CM | POA: Diagnosis not present

## 2019-07-25 DIAGNOSIS — Z2839 Encounter for supervision of normal pregnancy, unspecified, unspecified trimester: Secondary | ICD-10-CM

## 2019-07-25 DIAGNOSIS — O99613 Diseases of the digestive system complicating pregnancy, third trimester: Secondary | ICD-10-CM

## 2019-07-25 DIAGNOSIS — K5090 Crohn's disease, unspecified, without complications: Secondary | ICD-10-CM

## 2019-07-25 DIAGNOSIS — O99612 Diseases of the digestive system complicating pregnancy, second trimester: Secondary | ICD-10-CM

## 2019-07-25 DIAGNOSIS — O9989 Other specified diseases and conditions complicating pregnancy, childbirth and the puerperium: Secondary | ICD-10-CM

## 2019-07-25 DIAGNOSIS — Z3A27 27 weeks gestation of pregnancy: Secondary | ICD-10-CM

## 2019-07-25 DIAGNOSIS — O99891 Other specified diseases and conditions complicating pregnancy: Secondary | ICD-10-CM

## 2019-07-25 DIAGNOSIS — Z3480 Encounter for supervision of other normal pregnancy, unspecified trimester: Secondary | ICD-10-CM | POA: Diagnosis not present

## 2019-07-25 DIAGNOSIS — Z8759 Personal history of other complications of pregnancy, childbirth and the puerperium: Secondary | ICD-10-CM

## 2019-07-25 DIAGNOSIS — Z283 Underimmunization status: Secondary | ICD-10-CM

## 2019-07-25 NOTE — Progress Notes (Signed)
   PRENATAL VISIT NOTE  Subjective:  Jessica [name] is a 23 y.o. [phone] at 80w3dbeing seen today for ongoing prenatal care.  She is currently monitored for the following issues for this high-risk pregnancy and has Crohn's disease (HIglesia Antig[place]; Ileostomy in place ([facility]; Gestational hypertension w/o significant proteinuria in 3rd trimester; Missed abortion; Maternal Crohn's disease affecting pregnancy (HFort [place]; CC (Crohn's colitis) (HHobuck[place]; Rectal bleeding; Absolute anemia; Diversion colitis; Nausea; Malnutrition of moderate degree (HSanto Dom[place]; Nausea and vomiting; Crohn's ileitis ([place]incoln [place]; Diarrhea; Crohn's colitis (HSan Luis; NSVD (normal spontaneous vaginal delivery); Supervision of other normal pregnancy, antepartum; History of gestational hypertension; and Rubella non-immune status, antepartum on their problem list.  Patient reports difficulty sleeping at night.  Contractions: Not present. Vag. Bleeding: None.  Movement: Present. Denies leaking of fluid.   The following portions of the patient's history were reviewed and updated as appropriate: allergies, current medications, past family history, past medical history, past social history, past surgical history and problem list.   Objective:   Vitals:   07/25/19 0857  BP: 115/82  Pulse: 99  Temp: 98.2 F (36.8 C)  Weight: 200 lb (90.7 kg)    Fetal Status: Fetal Heart Rate (bpm): 156 Fundal Height: 28 cm Movement: Present     General:  Alert, oriented and cooperative. Patient is in no acute distress.  Skin: Skin is warm and dry. No rash noted.   Cardiovascular: Normal heart rate noted  Respiratory: Normal respiratory effort, no problems with respiration noted  Abdomen: Soft, gravid, appropriate for gestational age.  Pain/Pressure: Absent     Pelvic: Cervical exam deferred        Extremities: Normal range of motion.  Edema: None  Mental Status: Normal mood and affect. Normal behavior. Normal judgment and thought content.   Assessment and Plan:  Pregnancy:  [id number] 242w3d. Supervision of other normal pregnancy, antepartum - Patient reports difficulty sleeping at night otherwise doing well  - Educated and discussed magnesium supplements for difficulty sleeping, instructed to call GI MD and make sure magnesium supplements is fine to take with current crohn's management  - Routine prenatal care - Anticipatory guidance on upcoming appointments  - COVID 19 precautions  - Glucose Tolerance, 2 Hours w/1 Hour - CBC - RPR - HIV Antibody (routine testing w rflx) - Rubella screen  2. Need for Tdap vaccination - Tdap vaccine greater than or equal to 7yo IM  3. Rubella non-immune status, antepartum - MMR PP   4. History of gestational hypertension - BP stable during this pregnancy   5. Maternal Crohn's disease affecting pregnancy in third trimester (HCGum[place]- Management by GI  - Stable at this time  - Reports being taken off Entyvio 2 months ago for the remainder of pregnancy   Preterm labor symptoms and general obstetric precautions including but not limited to vaginal bleeding, contractions, leaking of fluid and fetal movement were reviewed in detail with the patient. Please refer to After Visit Summary for other counseling recommendations.   Return in about 4 weeks (around 08/22/2019) for ROB-mychart.  Future Appointments  Date Time Provider [place]8/09/2019 12:45 PM [facility]  07/29/2019 12:45 PM [place] WH-MFCUS MFC-US  08/22/2019  2:15 PM HaShelly BombardMD CWOverland[place]    VeLajean ManesCNM

## 2019-07-25 NOTE — Progress Notes (Signed)
CC: None   Pt made aware that female student pt is fine with student being in the room.     Tdap given.

## 2019-07-25 NOTE — Patient Instructions (Signed)

## 2019-07-26 LAB — CBC
Hematocrit: 31.6 % — ABNORMAL LOW (ref 34.0–46.6)
Hemoglobin: 10.7 g/dL — ABNORMAL LOW (ref 11.1–15.9)
MCH: 29.6 pg (ref 26.6–33.0)
MCHC: 33.9 g/dL (ref 31.5–35.7)
MCV: 87 fL (ref 79–97)
Platelets: 266 10*3/uL (ref 150–450)
RBC: 3.62 x10E6/uL — ABNORMAL LOW (ref 3.77–5.28)
RDW: 12.5 % (ref 11.7–15.4)
WBC: 8.8 10*3/uL (ref 3.4–10.8)

## 2019-07-26 LAB — RPR: RPR Ser Ql: NONREACTIVE

## 2019-07-26 LAB — HIV ANTIBODY (ROUTINE TESTING W REFLEX): HIV Screen 4th Generation wRfx: NONREACTIVE

## 2019-07-26 LAB — GLUCOSE TOLERANCE, 2 HOURS W/ 1HR
Glucose, 1 hour: 134 mg/dL (ref 65–179)
Glucose, 2 hour: 107 mg/dL (ref 65–152)
Glucose, Fasting: 85 mg/dL (ref 65–91)

## 2019-07-27 LAB — RUBELLA SCREEN: Rubella Antibodies, IGG: <0.9 index — ABNORMAL LOW (ref >0.99)

## 2019-07-29 ENCOUNTER — Other Ambulatory Visit: Payer: Self-pay

## 2019-07-29 ENCOUNTER — Ambulatory Visit (HOSPITAL_COMMUNITY): Payer: Medicaid Other | Admitting: *Deleted

## 2019-07-29 ENCOUNTER — Ambulatory Visit (HOSPITAL_COMMUNITY)
Admission: RE | Admit: 2019-07-29 | Discharge: 2019-07-29 | Disposition: A | Discharge status: Home / Self Care | Payer: Medicaid Other | Source: Ambulatory Visit | Attending: Obstetrics and Gynecology | Admitting: Obstetrics and Gynecology

## 2019-07-29 ENCOUNTER — Encounter (HOSPITAL_COMMUNITY): Payer: Self-pay

## 2019-07-29 DIAGNOSIS — O09293 Supervision of pregnancy with other poor reproductive or obstetric history, third trimester: Secondary | ICD-10-CM

## 2019-07-29 DIAGNOSIS — Z362 Encounter for other antenatal screening follow-up: Secondary | ICD-10-CM

## 2019-07-29 DIAGNOSIS — Z3A28 28 weeks gestation of pregnancy: Secondary | ICD-10-CM | POA: Diagnosis not present

## 2019-07-29 DIAGNOSIS — Z283 Underimmunization status: Secondary | ICD-10-CM | POA: Insufficient documentation

## 2019-07-29 DIAGNOSIS — K5090 Crohn's disease, unspecified, without complications: Secondary | ICD-10-CM | POA: Diagnosis not present

## 2019-07-29 DIAGNOSIS — O9989 Other specified diseases and conditions complicating pregnancy, childbirth and the puerperium: Secondary | ICD-10-CM | POA: Insufficient documentation

## 2019-07-29 DIAGNOSIS — Z8759 Personal history of other complications of pregnancy, childbirth and the puerperium: Secondary | ICD-10-CM | POA: Diagnosis not present

## 2019-07-29 DIAGNOSIS — O09899 Supervision of other high risk pregnancies, unspecified trimester: Secondary | ICD-10-CM

## 2019-07-29 DIAGNOSIS — Z3480 Encounter for supervision of other normal pregnancy, unspecified trimester: Secondary | ICD-10-CM

## 2019-07-29 DIAGNOSIS — O99613 Diseases of the digestive system complicating pregnancy, third trimester: Secondary | ICD-10-CM | POA: Diagnosis not present

## 2019-07-29 DIAGNOSIS — Z2839 Other underimmunization status: Secondary | ICD-10-CM

## 2019-07-30 ENCOUNTER — Other Ambulatory Visit (HOSPITAL_COMMUNITY): Payer: Self-pay | Admitting: *Deleted

## 2019-07-30 DIAGNOSIS — K5090 Crohn's disease, unspecified, without complications: Secondary | ICD-10-CM

## 2019-08-22 ENCOUNTER — Telehealth (INDEPENDENT_AMBULATORY_CARE_PROVIDER_SITE_OTHER): Payer: Medicaid Other | Admitting: Obstetrics

## 2019-08-22 ENCOUNTER — Encounter: Payer: Self-pay | Admitting: Obstetrics

## 2019-08-22 VITALS — BP 119/77 | HR 113

## 2019-08-22 DIAGNOSIS — O9989 Other specified diseases and conditions complicating pregnancy, childbirth and the puerperium: Secondary | ICD-10-CM

## 2019-08-22 DIAGNOSIS — O99613 Diseases of the digestive system complicating pregnancy, third trimester: Secondary | ICD-10-CM

## 2019-08-22 DIAGNOSIS — K5090 Crohn's disease, unspecified, without complications: Secondary | ICD-10-CM

## 2019-08-22 DIAGNOSIS — O0990 Supervision of high risk pregnancy, unspecified, unspecified trimester: Secondary | ICD-10-CM

## 2019-08-22 DIAGNOSIS — O0993 Supervision of high risk pregnancy, unspecified, third trimester: Secondary | ICD-10-CM

## 2019-08-22 DIAGNOSIS — Z8759 Personal history of other complications of pregnancy, childbirth and the puerperium: Secondary | ICD-10-CM

## 2019-08-22 DIAGNOSIS — Z283 Underimmunization status: Secondary | ICD-10-CM

## 2019-08-22 DIAGNOSIS — Z2839 Other underimmunization status: Secondary | ICD-10-CM

## 2019-08-22 DIAGNOSIS — Z3A31 31 weeks gestation of pregnancy: Secondary | ICD-10-CM

## 2019-08-22 NOTE — Progress Notes (Signed)
TELEHEALTH OBSTETRICS PRENATAL VIRTUAL VIDEO VISIT ENCOUNTER NOTE  Provider location: [facility] [place] [facility] [place]   I connected with Jessica [name] on 08/22/19 at  2:15 PM EDT by OB MyChart Video Encounter at home and verified that I am speaking with the correct person using two identifiers.   I discussed the limitations, risks, security and privacy concerns of performing an evaluation and management service virtually and the availability of in person appointments. I also discussed with the patient that there may be a patient responsible charge related to this service. The patient expressed understanding and agreed to proceed. Subjective:  Jessica [name] is a 23 y.o. [phone] at 14w3dbeing seen today for ongoing prenatal care.  She is currently monitored for the following issues for this high-risk pregnancy and has Crohn's disease ([place]; Ileostomy in place ([facility]; Gestational hypertension w/o significant proteinuria in 3rd trimester; Missed abortion; Maternal Crohn's disease affecting pregnancy ([place]; CC (Crohn's colitis) ([place]; Rectal bleeding; Absolute anemia; Diversion colitis; Nausea; Malnutrition of moderate degree ([place]; Nausea and vomiting; Crohn's ileitis (HWald[place]; Diarrhea; Crohn's colitis (HO[place]; NSVD (normal spontaneous vaginal delivery); Supervision of other normal pregnancy, antepartum; History of gestational hypertension; and Rubella non-immune status, antepartum on their problem list.  Patient reports backache and heartburn.  Contractions: Irritability. Vag. Bleeding: None.  Movement: Present. Denies any leaking of fluid.   The following portions of the patient's history were reviewed and updated as appropriate: allergies, current medications, past family history, past medical history, past social history, past surgical history and problem list.   Objective:   Vitals:   08/22/19 1334  BP: 119/77  Pulse: (!) 113    Fetal Status:     Movement: Present     General:   Alert, oriented and cooperative. Patient is in no acute distress.  Respiratory: Normal respiratory effort, no problems with respiration noted  Mental Status: Normal mood and affect. Normal behavior. Normal judgment and thought content.  Rest of physical exam deferred due to type of encounter  Imaging: UKoreaMfm Ob Follow Up  Result Date: 07/30/2019 ----------------------------------------------------------------------  OBSTETRICS REPORT                       (Signed Final 07/30/2019 11:58 am) ---------------------------------------------------------------------- Patient Info  ID #:       [id number]                         D.O.B.:  [birth year](22 yrs)  Name:       Jessica [name]                 Visit Date: 07/29/2019 12:50 pm ---------------------------------------------------------------------- Performed By  Performed By:     JGeorgie Chard       Ref. Address:     [address]  [address]                                                             [place]  Attending:        Sander Nephew      [facility]                                       [facility]  Referred By:      [facility] ---------------------------------------------------------------------- Orders   #  Description                          Code         Ordered By   1  [place] MFM OB FOLLOW UP                  40102.72     Sander Nephew  ----------------------------------------------------------------------   #  Order #                    Accession #                 Episode #   1  [id number]                  [id number]                  [id number]  ---------------------------------------------------------------------- Indications    Encounter for other antenatal screening        Z36.2   follow-up   Maternal Crohn's disease affecting             O99.612   pregnancy in second trimester Clover Mealy);   history of colectomy   Poor obstetric history: Previous gestational   O09.299   HTN   [redacted] weeks gestation of pregnancy                Z3A.28  ---------------------------------------------------------------------- Vital Signs  Weight (lb): 200                               Height:        5'6"  BMI:         32.28 ---------------------------------------------------------------------- Fetal Evaluation  Num Of Fetuses:  1  Fetal Heart Rate(bpm):  148  Cardiac Activity:       Observed  Presentation:           Cephalic  Placenta:               Posterior  P. Cord Insertion:      Visualized  Amniotic Fluid  AFI FV:      Within normal limits  AFI Sum(cm)     %Tile       Largest Pocket(cm)  13.77           43          5.18  RUQ(cm)       RLQ(cm)       LUQ(cm)        LLQ(cm)  5.18          1.28          4.94           2.37 ---------------------------------------------------------------------- Biometry  BPD:      70.1  mm     G. Age:  28w 1d         43  %    CI:        73.79   %    70 - 86                                                          FL/HC:      20.4   %    18.8 - 20.6  HC:      259.2  mm     G. Age:  28w 1d         25  %    HC/AC:      1.14        1.05 - 1.21  AC:      226.4  mm     G. Age:  27w 0d         16  %    FL/BPD:     75.5   %    71 - 87  FL:       52.9  mm     G. Age:  28w 1d         38  %    FL/AC:      23.4   %    20 - 24  HUM:      48.4  mm     G. Age:  28w 3d         55  %  Est. FW:    1097  gm      2 lb 7 oz     23  % ---------------------------------------------------------------------- OB History  Gravidity:    4         Term:   2        Prem:   0        SAB:   1  TOP:          0       Ectopic:  0        Living: 2 ---------------------------------------------------------------------- Gestational Age  LMP:           28w 0d         Date:  01/14/19  EDD:   10/21/19  U/S Today:     27w 6d                                        EDD:   10/22/19  Best:          28w 0d     Det. By:  LMP  (01/14/19)          EDD:   10/21/19 ---------------------------------------------------------------------- Anatomy  Cranium:               Appears normal         Diaphragm:              Appears normal  Cavum:                 Appears normal         Stomach:                Appears normal, left                                                                        sided  Thoracic:              Appears normal         Abdomen:                Appears normal  Heart:                 Appears normal         Kidneys:                Previously seen                         (4CH, axis, and                         situs)  Aortic Arch:           Appears normal         Bladder:                Previously seen ---------------------------------------------------------------------- Impression  Crohns Disease  Normal interval growth. ---------------------------------------------------------------------- Recommendations  Follow up growth in 4 weeks. ----------------------------------------------------------------------               Sander Nephew, MD Electronically Signed Final Report   07/30/2019 11:58 am ----------------------------------------------------------------------   Assessment and Plan:  Pregnancy: [id number] at 15w3d1. Supervision of high risk pregnancy, antepartum  2. Maternal Crohn's disease affecting pregnancy in third trimester ([place] - clinically stable  3. History of gestational hypertension - BP's clinically stable  4. Rubella non-immune status, antepartum - Rubella vaccination postpartum   Preterm labor symptoms and general obstetric precautions including but not limited to vaginal bleeding, contractions, leaking of fluid and fetal movement were reviewed in detail with the patient. I discussed the assessment and treatment plan with  the patient. The patient was provided an opportunity to ask questions and all were answered. The patient agreed with the plan and demonstrated an understanding of the  instructions. The patient was advised to call back or seek an in-person office evaluation/go to [facility] at [facility] for any urgent or concerning symptoms. Please refer to After Visit Summary for other counseling recommendations.   I provided 10 minutes of face-to-face time during this encounter.  No follow-ups on file.  Future Appointments  Date Time Provider [place]  08/22/2019  2:15 PM Shelly Bombard, MD [place] None  08/27/2019 12:45 PM [place] Fisher, [place] [facility] [place] [facility] 08/22/2019

## 2019-08-22 NOTE — Progress Notes (Signed)
I connected with  Jessica [name] on 08/22/19 by a video enabled telemedicine application and verified that I am speaking with the correct person using two identifiers.   MyChart ROB, c/o back pains 6/10 x 7 days.

## 2019-08-27 ENCOUNTER — Other Ambulatory Visit: Payer: Self-pay

## 2019-08-27 ENCOUNTER — Other Ambulatory Visit (HOSPITAL_COMMUNITY): Payer: Self-pay | Admitting: *Deleted

## 2019-08-27 ENCOUNTER — Ambulatory Visit (HOSPITAL_COMMUNITY)
Admission: RE | Admit: 2019-08-27 | Discharge: 2019-08-27 | Disposition: A | Discharge status: Home / Self Care | Payer: Medicaid Other | Source: Ambulatory Visit | Attending: Obstetrics and Gynecology | Admitting: Obstetrics and Gynecology

## 2019-08-27 DIAGNOSIS — O99613 Diseases of the digestive system complicating pregnancy, third trimester: Secondary | ICD-10-CM | POA: Diagnosis not present

## 2019-08-27 DIAGNOSIS — O09293 Supervision of pregnancy with other poor reproductive or obstetric history, third trimester: Secondary | ICD-10-CM

## 2019-08-27 DIAGNOSIS — Z3A32 32 weeks gestation of pregnancy: Secondary | ICD-10-CM

## 2019-08-27 DIAGNOSIS — K50919 Crohn's disease, unspecified, with unspecified complications: Secondary | ICD-10-CM

## 2019-08-27 DIAGNOSIS — K5090 Crohn's disease, unspecified, without complications: Secondary | ICD-10-CM

## 2019-08-27 DIAGNOSIS — Z362 Encounter for other antenatal screening follow-up: Secondary | ICD-10-CM

## 2019-09-05 ENCOUNTER — Telehealth: Payer: Self-pay | Admitting: Gastroenterology

## 2019-09-05 ENCOUNTER — Encounter: Payer: Self-pay | Admitting: Obstetrics and Gynecology

## 2019-09-05 ENCOUNTER — Telehealth (INDEPENDENT_AMBULATORY_CARE_PROVIDER_SITE_OTHER): Payer: Medicaid Other | Admitting: Obstetrics and Gynecology

## 2019-09-05 VITALS — BP 124/88 | HR 96

## 2019-09-05 DIAGNOSIS — Z3A33 33 weeks gestation of pregnancy: Secondary | ICD-10-CM

## 2019-09-05 DIAGNOSIS — Z3480 Encounter for supervision of other normal pregnancy, unspecified trimester: Secondary | ICD-10-CM

## 2019-09-05 DIAGNOSIS — Z8759 Personal history of other complications of pregnancy, childbirth and the puerperium: Secondary | ICD-10-CM

## 2019-09-05 DIAGNOSIS — Z2839 Other underimmunization status: Secondary | ICD-10-CM

## 2019-09-05 DIAGNOSIS — O9989 Other specified diseases and conditions complicating pregnancy, childbirth and the puerperium: Secondary | ICD-10-CM

## 2019-09-05 DIAGNOSIS — Z283 Underimmunization status: Secondary | ICD-10-CM

## 2019-09-05 DIAGNOSIS — O09899 Supervision of other high risk pregnancies, unspecified trimester: Secondary | ICD-10-CM

## 2019-09-05 DIAGNOSIS — O99613 Diseases of the digestive system complicating pregnancy, third trimester: Secondary | ICD-10-CM

## 2019-09-05 DIAGNOSIS — K5090 Crohn's disease, unspecified, without complications: Secondary | ICD-10-CM

## 2019-09-05 NOTE — Progress Notes (Signed)
[place] VIRTUAL VIDEO VISIT ENCOUNTER NOTE  Provider location: [facility] [place] [facility] [place]   I connected with Jessica [name] on 09/05/19 at  3:30 PM EDT by MyChart Video Encounter at home and verified that I am speaking with the correct person using two identifiers.   I discussed the limitations, risks, security and privacy concerns of performing an evaluation and management service virtually and the availability of in person appointments. I also discussed with the patient that there may be a patient responsible charge related to this service. The patient expressed understanding and agreed to proceed. Subjective:  Jessica [name] is a 23 y.o. [phone] at 37w3dbeing seen today for ongoing prenatal care.  She is currently monitored for the following issues for this high-risk pregnancy and has Crohn's disease ([place]; Ileostomy in place ([facility]; Missed abortion; Maternal Crohn's disease affecting pregnancy ([place]; CC (Crohn's colitis) ([place]; Rectal bleeding; Absolute anemia; Diversion colitis; Nausea; Malnutrition of moderate degree ([place]; Nausea and vomiting; Crohn's ileitis (HH[place]; Diarrhea; Crohn's colitis (HS[place]; Supervision of other normal pregnancy, antepartum; History of gestational hypertension; and Rubella non-immune status, antepartum on their problem list.  Patient reports some pain issues related to her Crohn's. Off Crohn's meds per GI.  Contractions: Not present. Vag. Bleeding: None.  Movement: Present. Denies any leaking of fluid.   The following portions of the patient's history were reviewed and updated as appropriate: allergies, current medications, past family history, past medical history, past social history, past surgical history and problem list.   Objective:   Vitals:   09/05/19 1451  BP: 124/88  Pulse: 96    Fetal Status:     Movement: Present     General:  Alert, oriented and cooperative. Patient is in no acute distress.  Respiratory:  Normal respiratory effort, no problems with respiration noted  Mental Status: Normal mood and affect. Normal behavior. Normal judgment and thought content.  Rest of physical exam deferred due to type of encounter  Imaging: UKoreaMfm Ob Follow Up  Result Date: 08/28/2019 ----------------------------------------------------------------------  OBSTETRICS REPORT                        (Signed Final 08/28/2019 06:23 am) ---------------------------------------------------------------------- Patient Info  ID #:       [id number]                         D.O.B.:  105/22/1997(22 yrs)  Name:       Jessica [name]                 Visit Date: 08/27/2019 12:46 pm ---------------------------------------------------------------------- Performed By  Performed By:     AHubert Azure         Ref. Address:      [address]  [address]                                                              [place]  Attending:        Sander Nephew      [facility]                                        [facility]etal [facility]  Referred By:      [facility] ---------------------------------------------------------------------- Orders   #  Description                          Code         Ordered By   1  [place] MFM OB FOLLOW UP                  91505.69     Sander Nephew  ----------------------------------------------------------------------   #  Order #                    Accession #                 Episode #   1  [id number]                  [id number]                  [id number]  ---------------------------------------------------------------------- Indications   Maternal Crohn's disease affecting             O99.612   pregnancy in  second trimester; history of   colectomy   Poor obstetric history: Previous gestational   O09.299   HTN   Maternal Crohn's disease affecting             O99.613   pregnancy in third trimester (HX of   colectomy)   [redacted] weeks gestation of pregnancy                Z3A.32  ---------------------------------------------------------------------- Vital Signs                                                 Height:  5'6" ---------------------------------------------------------------------- Fetal Evaluation  Num Of Fetuses:          1  Fetal Heart Rate(bpm):   168  Cardiac Activity:        Observed  Presentation:            Breech  Placenta:                Anterior  P. Cord Insertion:       Visualized, central  Amniotic Fluid  AFI FV:      Within normal limits  AFI Sum(cm)     %Tile       Largest Pocket(cm)  14.03           47          5.22  RUQ(cm)       RLQ(cm)       LUQ(cm)        LLQ(cm)  3.81          1.62          5.22           3.38 ---------------------------------------------------------------------- Biometry  BPD:      77.2  mm     G. Age:  31w 0d         12  %    CI:        69.06   %    70 - 86                                                          FL/HC:       21.0  %    19.1 - 21.3  HC:      296.7  mm     G. Age:  32w 6d         30  %    HC/AC:       1.03       0.96 - 1.17  AC:      289.4  mm     G. Age:  33w 0d         73  %    FL/BPD:      80.8  %    71 - 87  FL:       62.4  mm     G. Age:  32w 2d         42  %    FL/AC:       21.6  %    20 - 24  HUM:      56.1  mm     G. Age:  32w 5d         61  %  Est. FW:    2002   gm     4 lb 7 oz     53  % ---------------------------------------------------------------------- OB History  Gravidity:    4         Term:   2        Prem:   0        SAB:   1  TOP:          0       Ectopic:  0        Living: 2 ---------------------------------------------------------------------- Gestational Age  LMP:  32w 1d        Date:  01/14/19                 EDD:   10/21/19   U/S Today:     32w 2d                                        EDD:   10/20/19  Best:          32w 1d     Det. By:  LMP  (01/14/19)          EDD:   10/21/19 ---------------------------------------------------------------------- Anatomy  Cranium:               Appears normal         LVOT:                   Appears normal  Cavum:                 Appears normal         Aortic Arch:            Not well visualized  Ventricles:            Appears normal         Ductal Arch:            Previously seen  Choroid Plexus:        Previously seen        Diaphragm:              Appears normal  Cerebellum:            Previously seen        Stomach:                Appears normal, left                                                                        sided  Posterior Fossa:       Previously seen        Abdomen:                Appears normal  Nuchal Fold:           Previously seen        Abdominal Wall:         Previously seen  Face:                  Appears normal         Cord Vessels:           Previously seen                         (orbits and profile)  Lips:                  Appears normal         Kidneys:                Appear normal  Palate:  Not well visualized    Bladder:                Appears normal  Thoracic:              Appears normal         Spine:                  Previously seen  Heart:                 Appears normal         Upper Extremities:      Previously seen                         (4CH, axis, and                         situs)  RVOT:                  Appears normal         Lower Extremities:      Previously seen  Other:  Heels and right 5th digit previously visualized. Nasal bone visualized. ---------------------------------------------------------------------- Impression  Normal interval growth.  Inflammatory bowel disease  Normal fluid and fetal movement ---------------------------------------------------------------------- Recommendations  Repeat growth in 4 weeks.  ----------------------------------------------------------------------               Sander Nephew, MD Electronically Signed Final Report   08/28/2019 06:23 am ----------------------------------------------------------------------   Assessment and Plan:  Pregnancy: K2H0623 at 22w3d1. Rubella non-immune status, antepartum Vaccine PP  2. History of gestational hypertension BP stable  3. Supervision of other normal pregnancy, antepartum Stable F/U growth 09/24/19  4. Maternal Crohn's disease affecting pregnancy in third trimester ([place] Followed and managed per GI  Preterm labor symptoms and general obstetric precautions including but not limited to vaginal bleeding, contractions, leaking of fluid and fetal movement were reviewed in detail with the patient. I discussed the assessment and treatment plan with the patient. The patient was provided an opportunity to ask questions and all were answered. The patient agreed with the plan and demonstrated an understanding of the instructions. The patient was advised to call back or seek an in-person office evaluation/[facility] any [facility] or concerning [facility]. Please refer to After Visit Summary for other counseling recommendations.   I provided 8 minutes of face-to-face time during this encounter.  Return in about 2 weeks (around 09/19/2019) for OB visit, face to face.  Future Appointments  Date Time Provider [place] 09/05/2019  3:30 PM EChancy Milroy MD [place]  09/24/2019  1:30 PM [place] [facility] [place]

## 2019-09-05 NOTE — Telephone Encounter (Signed)
Called patient back and let her know I sent her message to Dr. Havery Moros about 30 mins. ago, but he is seeing patients all day and it maybe tomorrow before we have an answer for her. She is good with that

## 2019-09-05 NOTE — Progress Notes (Signed)
Pt states she is having problems with Crohns disease since being off medication. States Gertie Fey took her off meds prior to end of pregnancy. Pt is not able to sleep well at night- can't get to sleep and is uncomfortable- Recommended Tylenol PM and/or Bendaryl Pt has follow up u/s in October.

## 2019-09-05 NOTE — Telephone Encounter (Signed)
Pt has been having some crohns sxs since Jessica [name] stopped entyvio, Jessica [name] is [redacted] weeks pregnant and states that Dr. Havery Moros advised her to deliver at 38 weeks on her last pregnancy, Jessica [name] wants to know if Jessica [name] should do the same this tme. Pls call her.

## 2019-09-06 ENCOUNTER — Telehealth: Payer: Self-pay

## 2019-09-06 NOTE — Telephone Encounter (Signed)
I called the patient. She reports some of her typical Crohn's symptoms are bothering her. States she is [redacted] weeks pregnant and baby is okay based on recent evaluation. She was last seen in our office in April. At that time I had recommended continuing Entyvio with last dose planned around week 31 or 32. I'm not sure why, but she stopped all Entyvio after that visit with me and has not been on anything and did not follow up with [place], and now Crohn's is flaring. At this point in her pregnancy would not give Entyvio or other biologic, would use prednisone if she feels her symptoms warrant it, and then resume Entyvio post partum. Patient asked me to discuss with her OB team.  Drs. Rip Harbour and Stann Mainland, Sounds like this patient is having a Crohn's flare. I would typically treat with prednisone 48m / day for 2 weeks then a slow taper by 525m/ week until done. Not sure if you are okay with that while she is in this stage of her pregnancy, or would be more comfortable with a lower dose of 2033mhich we could try first and use the lowest dose possible. Thanks in advance for your feedback. I notified the patient we will call her once we hear back from you.

## 2019-09-06 NOTE — Telephone Encounter (Signed)
Called patient back again this morning and she states Dr.Armbruster did respond to her by My Chart, said she can't do the Prednisone and is wanting to know that Dr. Havery Moros and her GYN are going to talk regarding her induction time to make sure they are on the same page.

## 2019-09-06 NOTE — Telephone Encounter (Signed)
TC from pt regarding Chron's  Pt states she did reach out to her Gastroenterology provider Dr.Armbruster A telephone Deloris Ping message can be seen in pt chart from yesterday 09/05/19 from  [place] regarding discussing a plan with a OB provider  It was mentioned possibly induction at 38wks. Please advise.

## 2019-09-11 ENCOUNTER — Telehealth: Payer: Self-pay | Admitting: Gastroenterology

## 2019-09-11 ENCOUNTER — Other Ambulatory Visit: Payer: Self-pay

## 2019-09-11 ENCOUNTER — Encounter: Payer: Self-pay | Admitting: Obstetrics and Gynecology

## 2019-09-11 MED ORDER — PREDNISONE 5 MG PO TABS: 20.0000 mg | ORAL_TABLET | Freq: Every day | ORAL | 0 refills | Status: DC

## 2019-09-11 NOTE — Progress Notes (Signed)
Discussed case with Dr Gaynell Face MFM. Would not IOL prior to 39 weeks without maternal or fetal indications. We both agreed that GI can treat any Chron's flare up with prednisone.  She has a growth scan 09/24/19.  This information was communicated to Dr Ruma Cellar.  Pt is to be seen next week and this will communicated to her at that visit.

## 2019-09-11 NOTE — Telephone Encounter (Signed)
Jessica [name] can you please relay the following: - I heard back from Dr. Rip Harbour. No plans for induction of her delivery prior to 39 weeks unless there are issues with Mom or baby, she can discuss this further with Dr. Rip Harbour - if she needs something for her Crohn's disease, they are okay with prednisone as the treatment option. If her symptoms are mild, we could go with prednisone 51m / day for 2 weeks then taper by 528m/ week. If she needs more than that or it doesn't help, would give her 4038m day for 2 weeks then taper by 5mg50mweek - [facility] and [facility] from [facility] delivery. She needs to contact us o[place]oreae home from her delivery so we can coordinate Entyvio infusion. Thanks

## 2019-09-11 NOTE — Telephone Encounter (Signed)
Called patient and let her know Dr. Sydnee Levans and Dr. Junie Panning have talked and she does not want to induce labor prior to 39 weeks, unless Mom or baby are having issues. And that would be discussed at that time. Patient agreed to try the Prednisone. Sent order to patient's pharmacy for 20 mg daily for 2 weeks then to taper by 5 mg per week until done. Instructions given to patient. Also asked patient to contact us after she deliver and recovers, so we can start back on her Entyvio infusions

## 2019-09-18 ENCOUNTER — Other Ambulatory Visit: Payer: Self-pay

## 2019-09-18 ENCOUNTER — Ambulatory Visit (INDEPENDENT_AMBULATORY_CARE_PROVIDER_SITE_OTHER): Payer: Medicaid Other | Admitting: Obstetrics and Gynecology

## 2019-09-18 ENCOUNTER — Encounter: Payer: Self-pay | Admitting: Obstetrics and Gynecology

## 2019-09-18 VITALS — BP 125/84 | HR 93 | Wt 202.0 lb

## 2019-09-18 DIAGNOSIS — O09293 Supervision of pregnancy with other poor reproductive or obstetric history, third trimester: Secondary | ICD-10-CM

## 2019-09-18 DIAGNOSIS — Z2839 Other underimmunization status: Secondary | ICD-10-CM

## 2019-09-18 DIAGNOSIS — Z3480 Encounter for supervision of other normal pregnancy, unspecified trimester: Secondary | ICD-10-CM

## 2019-09-18 DIAGNOSIS — Z8759 Personal history of other complications of pregnancy, childbirth and the puerperium: Secondary | ICD-10-CM

## 2019-09-18 DIAGNOSIS — K5010 Crohn's disease of large intestine without complications: Secondary | ICD-10-CM

## 2019-09-18 DIAGNOSIS — Z3A35 35 weeks gestation of pregnancy: Secondary | ICD-10-CM

## 2019-09-18 DIAGNOSIS — O9989 Other specified diseases and conditions complicating pregnancy, childbirth and the puerperium: Secondary | ICD-10-CM

## 2019-09-18 DIAGNOSIS — O09899 Supervision of other high risk pregnancies, unspecified trimester: Secondary | ICD-10-CM

## 2019-09-18 DIAGNOSIS — Z283 Underimmunization status: Secondary | ICD-10-CM

## 2019-09-18 NOTE — Progress Notes (Signed)
Patient reports fetal movement with some pressure.

## 2019-09-18 NOTE — Progress Notes (Signed)
   PRENATAL VISIT NOTE  Subjective:  Jessica [name] is a 23 y.o. [phone] at 88w2dbeing seen today for ongoing prenatal care.  She is currently monitored for the following issues for this low-risk pregnancy and has Crohn's disease (HColdspring; Ileostomy in place ([facility]; Missed abortion; Maternal Crohn's disease affecting pregnancy (HPort [place]; CC (Crohn's colitis) (HHazard; Rectal bleeding; Absolute anemia; Diversion colitis; Nausea; Malnutrition of moderate degree (HPrescott; Nausea and vomiting; Crohn's ileitis (HAmboy; Diarrhea; Crohn's colitis (HFort Thomas; Supervision of other normal pregnancy, antepartum; History of gestational hypertension; and Rubella non-immune status, antepartum on their problem list.  Patient reports pain with crohn's, managed with prednisone, 20 mg daily.  Contractions: Not present. Vag. Bleeding: None.  Movement: Present. Denies leaking of fluid.   The following portions of the patient's history were reviewed and updated as appropriate: allergies, current medications, past family history, past medical history, past social history, past surgical history and problem list.   Objective:   Vitals:   09/18/19 1317  BP: 125/84  Pulse: 93  Weight: 202 lb (91.6 kg)    Fetal Status: Fetal Heart Rate (bpm): 202lb   Movement: Present     General:  Alert, oriented and cooperative. Patient is in no acute distress.  Skin: Skin is warm and dry. No rash noted.   Cardiovascular: Normal heart rate noted  Respiratory: Normal respiratory effort, no problems with respiration noted  Abdomen: Soft, gravid, appropriate for gestational age.  Pain/Pressure: Present     Pelvic: Cervical exam deferred        Extremities: Normal range of motion.  Edema: Trace  Mental Status: Normal mood and affect. Normal behavior. Normal judgment and thought content.   Assessment and Plan:  Pregnancy: [id number] 364w2d1. Supervision of other normal pregnancy, antepartum F/u [place]0/6/20 Declines flu today  2. History of  gestational hypertension BP normal today  3. Rubella non-immune status, antepartum MMR pp  4. Crohn's disease of colon without complication (HCGilbertOn prednisone 20 mg daily Having flare up  Preterm labor symptoms and general obstetric precautions including but not limited to vaginal bleeding, contractions, leaking of fluid and fetal movement were reviewed in detail with the patient. Please refer to After Visit Summary for other counseling recommendations.   Return in about 1 week (around 09/25/2019) for low OB, in person, 36 week swabs.  Future Appointments  Date Time Provider De[place]10/05/2019  1:30 PM WH-MFC [place] WH-MFCUS MFC-US    KeSloan LeiterMD

## 2019-09-18 NOTE — Patient Instructions (Signed)
Use the following websites (and others) to help learn more about your contraception options and find the method that is right for you!  - The Centers for Disease Control (CDC) website: MomentumMarket.be  - [facility] website: https://www.plannedparenthood.org/learn/birth-control  - Bedsider.org: https://www.bedsider.org/methods   Go to bedsider.org for more information!  Contraception Choices Contraception, also called birth control, refers to methods or devices that prevent pregnancy. Hormonal methods Contraceptive implant A contraceptive implant is a thin, plastic tube that contains a hormone. It is inserted into the upper part of the arm. It can remain in place for up to 3 years. Progestin-only injections Progestin-only injections are injections of progestin, a synthetic form of the hormone progesterone. They are given every 3 months by a health care provider. Birth control pills Birth control pills are pills that contain hormones that prevent pregnancy. They must be taken once a day, preferably at the same time each day. Birth control patch The birth control patch contains hormones that prevent pregnancy. It is placed on the skin and must be changed once a week for three weeks and removed on the fourth week. A prescription is needed to use this method of contraception. Vaginal ring A vaginal ring contains hormones that prevent pregnancy. It is placed in the vagina for three weeks and removed on the fourth week. After that, the process is repeated with a new ring. A prescription is needed to use this method of contraception. Emergency contraceptive Emergency contraceptives prevent pregnancy after unprotected sex. They come in pill form and can be taken up to 5 days after sex. They work best the sooner they are taken after having sex. Most emergency contraceptives are available without a prescription. This method should not be used as  your only form of birth control. Barrier methods Female condom A female condom is a thin sheath that is worn over the penis during sex. Condoms keep sperm from going inside a woman's body. They can be used with a spermicide to increase their effectiveness. They should be disposed after a single use. Female condom A female condom is a soft, loose-fitting sheath that is put into the vagina before sex. The condom keeps sperm from going inside a woman's body. They should be disposed after a single use.  Intrauterine contraception Intrauterine device (IUD) An IUD is a T-shaped device that is put in a woman's uterus. There are two types:  Hormone IUD.This type contains progestin, a synthetic form of the hormone progesterone. This type can stay in place for 3-5 years.  Copper IUD.This type is wrapped in copper wire. It can stay in place for 10 years.  Permanent methods of contraception Female tubal ligation In this method, a woman's fallopian tubes are sealed, tied, or blocked during surgery to prevent eggs from traveling to the uterus.  Female sterilization This is a procedure to tie off the tubes that carry sperm (vasectomy). After the procedure, the man can still ejaculate fluid (semen).  Summary  Contraception, also called birth control, means methods or devices that prevent pregnancy.  Hormonal methods of contraception include implants, injections, pills, patches, vaginal rings, and emergency contraceptives.  Barrier methods of contraception can include female condoms, female condoms, diaphragms, cervical caps, sponges, and spermicides.  There are two types of IUDs (intrauterine devices). An IUD can be put in a woman's uterus to prevent pregnancy for 3-5 years.  Permanent sterilization can be done through a procedure for males, females, or both. This information is not intended to replace advice given to you  by your health care provider. Make sure you discuss any questions you have with your  health care provider. Document Released: 12/05/2005 Document Revised: 01/07/2017 Document Reviewed: 01/07/2017 Elsevier Interactive Patient Education  2018 [employer].

## 2019-09-24 ENCOUNTER — Ambulatory Visit (HOSPITAL_COMMUNITY)
Admission: RE | Admit: 2019-09-24 | Discharge: 2019-09-24 | Disposition: A | Discharge status: Home / Self Care | Payer: Medicaid Other | Source: Ambulatory Visit | Attending: Obstetrics and Gynecology | Admitting: Obstetrics and Gynecology

## 2019-09-24 ENCOUNTER — Other Ambulatory Visit: Payer: Self-pay

## 2019-09-24 DIAGNOSIS — Z3A36 36 weeks gestation of pregnancy: Secondary | ICD-10-CM

## 2019-09-24 DIAGNOSIS — K50919 Crohn's disease, unspecified, with unspecified complications: Secondary | ICD-10-CM | POA: Insufficient documentation

## 2019-09-24 DIAGNOSIS — O99613 Diseases of the digestive system complicating pregnancy, third trimester: Secondary | ICD-10-CM | POA: Diagnosis not present

## 2019-09-24 DIAGNOSIS — Z362 Encounter for other antenatal screening follow-up: Secondary | ICD-10-CM

## 2019-09-24 DIAGNOSIS — O09293 Supervision of pregnancy with other poor reproductive or obstetric history, third trimester: Secondary | ICD-10-CM

## 2019-09-25 ENCOUNTER — Other Ambulatory Visit (HOSPITAL_COMMUNITY)
Admission: RE | Admit: 2019-09-25 | Discharge: 2019-09-25 | Disposition: A | Discharge status: Home / Self Care | Payer: Medicaid Other | Source: Ambulatory Visit | Attending: Certified Nurse Midwife | Admitting: Certified Nurse Midwife

## 2019-09-25 ENCOUNTER — Other Ambulatory Visit: Payer: Self-pay

## 2019-09-25 ENCOUNTER — Ambulatory Visit (INDEPENDENT_AMBULATORY_CARE_PROVIDER_SITE_OTHER): Payer: Medicaid Other | Admitting: Certified Nurse Midwife

## 2019-09-25 VITALS — BP 123/86 | HR 86 | Wt 202.6 lb

## 2019-09-25 DIAGNOSIS — Z2839 Other underimmunization status: Secondary | ICD-10-CM

## 2019-09-25 DIAGNOSIS — O99891 Other specified diseases and conditions complicating pregnancy: Secondary | ICD-10-CM

## 2019-09-25 DIAGNOSIS — Z3A36 36 weeks gestation of pregnancy: Secondary | ICD-10-CM

## 2019-09-25 DIAGNOSIS — Z8759 Personal history of other complications of pregnancy, childbirth and the puerperium: Secondary | ICD-10-CM

## 2019-09-25 DIAGNOSIS — A568 Sexually transmitted chlamydial infection of other sites: Secondary | ICD-10-CM

## 2019-09-25 DIAGNOSIS — Z283 Underimmunization status: Secondary | ICD-10-CM

## 2019-09-25 DIAGNOSIS — Z3480 Encounter for supervision of other normal pregnancy, unspecified trimester: Secondary | ICD-10-CM

## 2019-09-25 DIAGNOSIS — O98313 Other infections with a predominantly sexual mode of transmission complicating pregnancy, third trimester: Secondary | ICD-10-CM

## 2019-09-25 DIAGNOSIS — K5090 Crohn's disease, unspecified, without complications: Secondary | ICD-10-CM

## 2019-09-25 DIAGNOSIS — O09293 Supervision of pregnancy with other poor reproductive or obstetric history, third trimester: Secondary | ICD-10-CM

## 2019-09-25 DIAGNOSIS — O99613 Diseases of the digestive system complicating pregnancy, third trimester: Secondary | ICD-10-CM

## 2019-09-25 NOTE — Progress Notes (Signed)
PRENATAL VISIT NOTE  Subjective:  Jessica [name] is a 23 y.o. G4P2012 at [redacted]w[redacted]d being seen today for ongoing prenatal care.  She is currently monitored for the following issues for this high-risk pregnancy and has Crohn's disease (HCC); Ileostomy in place (HCC); Missed abortion; Maternal Crohn's disease affecting pregnancy (HCC); CC (Crohn's colitis) (HCC); Rectal bleeding; Absolute anemia; Diversion colitis; Nausea; Malnutrition of moderate degree (HCC); Nausea and vomiting; Crohn's ileitis (HCC); Diarrhea; Crohn's colitis (HCC); Supervision of other normal pregnancy, antepartum; History of gestational hypertension; and Rubella non-immune status, antepartum on their problem list.  Patient reports no complaints.  Contractions: Not present. Vag. Bleeding: None.  Movement: Present. Denies leaking of fluid.   The following portions of the patient's history were reviewed and updated as appropriate: allergies, current medications, past family history, past medical history, past social history, past surgical history and problem list.   Objective:   Vitals:   09/25/19 1430  BP: 123/86  Pulse: 86  Weight: 202 lb 9.6 oz (91.9 kg)    Fetal Status: Fetal Heart Rate (bpm): 136 Fundal Height: 33 cm Movement: Present  Presentation: Vertex  General:  Alert, oriented and cooperative. Patient is in no acute distress.  Skin: Skin is warm and dry. No rash noted.   Cardiovascular: Normal heart rate noted  Respiratory: Normal respiratory effort, no problems with respiration noted  Abdomen: Soft, gravid, appropriate for gestational age.  Pain/Pressure: Absent     Pelvic: Cervical exam performed Dilation: 2 Effacement (%): Thick Station: -3  Extremities: Normal range of motion.  Edema: Trace  Mental Status: Normal mood and affect. Normal behavior. Normal judgment and thought content.   Assessment and Plan:  Pregnancy: G4P2012 at [redacted]w[redacted]d 1. Supervision of other normal pregnancy, antepartum - Patient doing  well, no complaints - Anticipatory guidance on upcoming appointments  - Routine prenatal care - educated and discussed miles circuit, walking, squat and lunges to induce contractions at home along with IC  - Strep Gp B NAA - Cervicovaginal ancillary only( [place])  2. Rubella non-immune status, antepartum - MMR PP   3. History of gestational hypertension - BP stable and normotensive during this pregnancy   4. Maternal Crohn's disease affecting pregnancy in third trimester (HCC) - Patient not on entyvio and currently tapering down prednisone   Preterm labor symptoms and general obstetric precautions including but not limited to vaginal bleeding, contractions, leaking of fluid and fetal movement were reviewed in detail with the patient. Please refer to After Visit Summary for other counseling recommendations.    Future Appointments  Date Time Provider Department Center  10/02/2019  2:15 PM Sparacino, Hailey L, DO [facility] None  10/09/2019  2:35 PM Burleson, Terri L, NP [facility] None  10/16/2019  1:30 PM Pratt, Tanya S, MD [facility] None  10/23/2019  1:30 PM Nugent, Nicole E, NP [facility] None     C , CNM 

## 2019-09-27 LAB — STREP GP B NAA: Strep Gp B NAA: NEGATIVE

## 2019-09-30 ENCOUNTER — Other Ambulatory Visit: Payer: Self-pay

## 2019-09-30 ENCOUNTER — Telehealth: Payer: Self-pay

## 2019-09-30 ENCOUNTER — Ambulatory Visit (INDEPENDENT_AMBULATORY_CARE_PROVIDER_SITE_OTHER): Payer: Medicaid Other | Admitting: Obstetrics and Gynecology

## 2019-09-30 ENCOUNTER — Encounter: Payer: Self-pay | Admitting: Obstetrics and Gynecology

## 2019-09-30 VITALS — BP 124/82 | HR 94 | Wt 201.1 lb

## 2019-09-30 DIAGNOSIS — K5090 Crohn's disease, unspecified, without complications: Secondary | ICD-10-CM

## 2019-09-30 DIAGNOSIS — Z3480 Encounter for supervision of other normal pregnancy, unspecified trimester: Secondary | ICD-10-CM | POA: Diagnosis not present

## 2019-09-30 DIAGNOSIS — Z283 Underimmunization status: Secondary | ICD-10-CM

## 2019-09-30 DIAGNOSIS — A749 Chlamydial infection, unspecified: Secondary | ICD-10-CM

## 2019-09-30 DIAGNOSIS — O99613 Diseases of the digestive system complicating pregnancy, third trimester: Secondary | ICD-10-CM

## 2019-09-30 DIAGNOSIS — O98813 Other maternal infectious and parasitic diseases complicating pregnancy, third trimester: Secondary | ICD-10-CM

## 2019-09-30 DIAGNOSIS — Z3A37 37 weeks gestation of pregnancy: Secondary | ICD-10-CM

## 2019-09-30 DIAGNOSIS — O99891 Other specified diseases and conditions complicating pregnancy: Secondary | ICD-10-CM

## 2019-09-30 DIAGNOSIS — Z2839 Other underimmunization status: Secondary | ICD-10-CM

## 2019-09-30 DIAGNOSIS — Z8759 Personal history of other complications of pregnancy, childbirth and the puerperium: Secondary | ICD-10-CM

## 2019-09-30 DIAGNOSIS — O09899 Supervision of other high risk pregnancies, unspecified trimester: Secondary | ICD-10-CM

## 2019-09-30 LAB — POCT URINALYSIS DIPSTICK
Blood, UA: NEGATIVE
Glucose, UA: NEGATIVE
Nitrite, UA: NEGATIVE
Protein, UA: POSITIVE — AB
Spec Grav, UA: 1.01 (ref 1.010–1.025)
Urobilinogen, UA: 0.2 E.U./dL
pH, UA: 7.5 (ref 5.0–8.0)

## 2019-09-30 MED ORDER — CYCLOBENZAPRINE HCL 10 MG PO TABS: 10.0000 mg | ORAL_TABLET | Freq: Three times a day (TID) | ORAL | 2 refills | Status: DC | PRN

## 2019-09-30 MED ORDER — AZITHROMYCIN 500 MG PO TABS: 1000.0000 mg | ORAL_TABLET | Freq: Once | ORAL | 1 refills | Status: AC

## 2019-09-30 NOTE — Telephone Encounter (Signed)
Patient called stating over the weekend she had an increase Bp of 122/90. Patient states she did have some lower back pain on and off this weekend as well. Patient states baby moving well.   Patient appointment given today at 145 and cancelled her in person visit for Wednesday. Patient instructed if she begins to have bleeding, leaking of fluid, or baby not moving before her appointment she needs to go straight to [facility] at [facility] for evaluation. Patient states understanding. Kathrene Alu RN

## 2019-09-30 NOTE — Progress Notes (Signed)
Pt is here for ROB. [redacted]w[redacted]d

## 2019-09-30 NOTE — Progress Notes (Signed)
   PRENATAL VISIT NOTE  Subjective:  Jessica [name] is a 23 y.o. [phone] at 73w0dbeing seen today for ongoing prenatal care.  She is currently monitored for the following issues for this low-risk pregnancy and has Crohn's disease (HMathis; Ileostomy in place ([facility]; Missed abortion; Maternal Crohn's disease affecting pregnancy (HMar[place]; CC (Crohn's colitis) (HC[place] Lawns; Rectal bleeding; Absolute anemia; Diversion colitis; Nausea; Malnutrition of moderate degree (HBertsch-Ocean[place]; Nausea and vomiting; Crohn's ileitis (HMackinaw; Diarrhea; Crohn's colitis (HMartinsdale; Supervision of other normal pregnancy, antepartum; History of gestational hypertension; and Rubella non-immune status, antepartum on their problem list.  Patient reports backache.  Contractions: Irritability. Vag. Bleeding: None.  Movement: Present. Denies leaking of fluid.   The following portions of the patient's history were reviewed and updated as appropriate: allergies, current medications, past family history, past medical history, past social history, past surgical history and problem list.   Objective:   Vitals:   09/30/19 1329  BP: 124/82  Pulse: 94  Weight: 201 lb 1.6 oz (91.2 kg)    Fetal Status: Fetal Heart Rate (bpm): 145 Fundal Height: 37 cm Movement: Present     General:  Alert, oriented and cooperative. Patient is in no acute distress.  Skin: Skin is warm and dry. No rash noted.   Cardiovascular: Normal heart rate noted  Respiratory: Normal respiratory effort, no problems with respiration noted  Abdomen: Soft, gravid, appropriate for gestational age.  Pain/Pressure: Absent     Pelvic: Cervical exam deferred        Extremities: Normal range of motion.  Edema: Trace  Mental Status: Normal mood and affect. Normal behavior. Normal judgment and thought content.   Assessment and Plan:  Pregnancy: [id number] 360w0d. Supervision of other normal pregnancy, antepartum Patient is doing well She reports persistent back pain despite maternity support  belt. Encouraged the patient to stretch. Rx Flexeril provided During visit- Infectious disease report was faxed demonstrating that patient has chlamydia (gonorrhea remains pending). Patient informed of the result. Rx provided. Patient advised to inform partner and have him treated. Patient was encouraged to abstain until delivery - POCT Urinalysis Dipstick - Culture, OB Urine  2. Rubella non-immune status, antepartum Will offer postpartum  3. History of gestational hypertension Normotensive  4. Maternal Crohn's disease affecting pregnancy in third trimester (HCArvadaStable Completed steroid taper  Term labor symptoms and general obstetric precautions including but not limited to vaginal bleeding, contractions, leaking of fluid and fetal movement were reviewed in detail with the patient. Please refer to After Visit Summary for other counseling recommendations.   Return in about 1 week (around 10/07/2019) for Virtual, ROB, High risk.  Future Appointments  Date Time Provider DeRodanthe10/21/2020  2:35 PM BuVirginia RochesterNP [facility] None  10/16/2019  1:30 PM PrDonnamae JudeMD [facility] None  10/23/2019  1:30 PM Nugent, NiGerrie NordmannNP [facility] None    PeMora BellmanMD

## 2019-10-02 ENCOUNTER — Encounter: Payer: Medicaid Other | Admitting: Family Medicine

## 2019-10-02 LAB — URINE CULTURE, OB REFLEX

## 2019-10-02 LAB — CULTURE, OB URINE

## 2019-10-02 MED ORDER — CEPHALEXIN 500 MG PO CAPS: 500.0000 mg | ORAL_CAPSULE | Freq: Four times a day (QID) | ORAL | 2 refills | Status: DC

## 2019-10-02 NOTE — Addendum Note (Signed)
Addended by: Mora Bellman on: 10/02/2019 03:28 PM   Modules accepted: Orders

## 2019-10-03 LAB — CERVICOVAGINAL ANCILLARY ONLY
Chlamydia: POSITIVE — AB
Comment: NEGATIVE
Comment: NORMAL
Neisseria Gonorrhea: NEGATIVE

## 2019-10-04 MED ORDER — AZITHROMYCIN 500 MG PO TABS: 1000.0000 mg | ORAL_TABLET | Freq: Once | ORAL | 0 refills | Status: AC

## 2019-10-04 NOTE — Addendum Note (Signed)
Addended by: Lajean Manes on: 10/04/2019 08:35 PM   Modules accepted: Orders

## 2019-10-09 ENCOUNTER — Encounter: Payer: Self-pay | Admitting: Nurse Practitioner

## 2019-10-09 ENCOUNTER — Telehealth (INDEPENDENT_AMBULATORY_CARE_PROVIDER_SITE_OTHER): Payer: Medicaid Other | Admitting: Nurse Practitioner

## 2019-10-09 VITALS — BP 105/86

## 2019-10-09 DIAGNOSIS — Z3480 Encounter for supervision of other normal pregnancy, unspecified trimester: Secondary | ICD-10-CM

## 2019-10-09 DIAGNOSIS — Z3483 Encounter for supervision of other normal pregnancy, third trimester: Secondary | ICD-10-CM

## 2019-10-09 DIAGNOSIS — Z3A38 38 weeks gestation of pregnancy: Secondary | ICD-10-CM

## 2019-10-09 NOTE — Patient Instructions (Signed)

## 2019-10-09 NOTE — Progress Notes (Signed)
I connected with@ on 10/09/19 at  2:35 PM EDT by MyChart and verified that I am speaking with the correct person using two identifiers.  Patient is located at a public location and provider is located at office - [employer].     The purpose of this virtual visit is to provide medical care while limiting exposure to the novel coronavirus. I discussed the limitations, risks, security and privacy concerns of performing an evaluation and management service by Earlie Server, NP and the availability of in person appointments. I also discussed with the patient that there may be a patient responsible charge related to this service. By engaging in this virtual visit, you consent to the provision of healthcare.  Additionally, you authorize for your insurance to be billed for the services provided during this visit.  The patient expressed understanding and agreed to proceed.  The following staff members participated in the virtual visit: Earlie Server, NP    PRENATAL VISIT NOTE  Subjective:  Jessica [name] is a 23 y.o. [id number] at [redacted]w[redacted]d for phone visit for ongoing prenatal care.  She is currently monitored for the following issues for this high-risk pregnancy and has Crohn's disease (HAllentown; Ileostomy in place ([facility]; Missed abortion; Maternal Crohn's disease affecting pregnancy (HOlivet; CC (Crohn's colitis) (HPlainview; Rectal bleeding; Absolute anemia; Diversion colitis; Nausea; Malnutrition of moderate degree (HAugusta; Nausea and vomiting; Crohn's ileitis (HGunnison; Diarrhea; Crohn's colitis (HLake Victoria; Supervision of other normal pregnancy, antepartum; History of gestational hypertension; Rubella non-immune status, antepartum; and Chlamydia infection affecting pregnancy, antepartum on their problem list.  Patient reports no complaints.  Contractions: Irritability.  .  Movement: Present. Denies leaking of fluid.   The following portions of the patient's history were reviewed and updated as appropriate: allergies, current  medications, past family history, past medical history, past social history, past surgical history and problem list.   Objective:   Vitals:   10/09/19 2022  BP: 105/86   Self-Obtained on 10-2=19-21  Fetal Status:     Movement: Present     Assessment and Plan:  Pregnancy: G4P2012 at 251w2d. Supervision of other normal pregnancy, antepartum Doing well   No questions.  Baby is moving well. Babyscripts data reviewed - BP is normal.  Term labor symptoms and general obstetric precautions including but not limited to vaginal bleeding, contractions, leaking of fluid and fetal movement were reviewed in detail with the patient.  Return in about 1 week (around 10/16/2019) for in office ROB.  Appointments are scheduled.  Future Appointments  Date Time Provider De[place]10/28/2020  1:30 PM PrDonnamae JudeMD [facility] None  10/23/2019  1:30 PM Nugent, NiGerrie NordmannNP [facility] None     Time spent on virtual visit:8 minutes  TeVirginia RochesterNP

## 2019-10-16 ENCOUNTER — Other Ambulatory Visit: Payer: Self-pay

## 2019-10-16 ENCOUNTER — Other Ambulatory Visit: Payer: Self-pay | Admitting: Advanced Practice Midwife

## 2019-10-16 ENCOUNTER — Ambulatory Visit (INDEPENDENT_AMBULATORY_CARE_PROVIDER_SITE_OTHER): Payer: Medicaid Other | Admitting: Family Medicine

## 2019-10-16 VITALS — BP 132/92 | HR 81 | Wt 203.0 lb

## 2019-10-16 DIAGNOSIS — Z3A39 39 weeks gestation of pregnancy: Secondary | ICD-10-CM

## 2019-10-16 DIAGNOSIS — O99613 Diseases of the digestive system complicating pregnancy, third trimester: Secondary | ICD-10-CM

## 2019-10-16 DIAGNOSIS — O169 Unspecified maternal hypertension, unspecified trimester: Secondary | ICD-10-CM | POA: Diagnosis not present

## 2019-10-16 DIAGNOSIS — O163 Unspecified maternal hypertension, third trimester: Secondary | ICD-10-CM

## 2019-10-16 DIAGNOSIS — K5090 Crohn's disease, unspecified, without complications: Secondary | ICD-10-CM

## 2019-10-16 DIAGNOSIS — Z3480 Encounter for supervision of other normal pregnancy, unspecified trimester: Secondary | ICD-10-CM

## 2019-10-16 NOTE — Patient Instructions (Signed)
Preeclampsia and Eclampsia Preeclampsia is a serious condition that may develop during pregnancy. This condition causes high blood pressure and increased protein in your urine along with other symptoms, such as headaches and vision changes. These symptoms may develop as the condition gets worse. Preeclampsia may occur at 20 weeks of pregnancy or later. Diagnosing and treating preeclampsia early is very important. If not treated early, it can cause serious problems for you and your baby. One problem it can lead to is eclampsia. Eclampsia is a condition that causes muscle jerking or shaking (convulsions or seizures) and other serious problems for the mother. During pregnancy, delivering your baby may be the best treatment for preeclampsia or eclampsia. For most women, preeclampsia and eclampsia symptoms go away after giving birth. In rare cases, a woman may develop preeclampsia after giving birth (postpartum preeclampsia). This usually occurs within 48 hours after childbirth but may occur up to 6 weeks after giving birth. What are the causes? The cause of preeclampsia is not known. What increases the risk? The following risk factors make you more likely to develop preeclampsia:  Being pregnant for the first time.  Having had preeclampsia during a past pregnancy.  Having a family history of preeclampsia.  Having high blood pressure.  Being pregnant with more than one baby.  Being 61 or older.  Being African-American.  Having kidney disease or diabetes.  Having medical conditions such as lupus or blood diseases.  Being very overweight (obese). What are the signs or symptoms? The most common symptoms are:  Severe headaches.  Vision problems, such as blurred or double vision.  Abdominal pain, especially upper abdominal pain. Other symptoms that may develop as the condition gets worse include:  Sudden weight gain.  Sudden swelling of the hands, face, legs, and feet.  Severe  nausea and vomiting.  Numbness in the face, arms, legs, and feet.  Dizziness.  Urinating less than usual.  Slurred speech.  Convulsions or seizures. How is this diagnosed? There are no screening tests for preeclampsia. Your health care provider will ask you about symptoms and check for signs of preeclampsia during your prenatal visits. You may also have tests that include:  Checking your blood pressure.  Urine tests to check for protein. Your health care provider will check for this at every prenatal visit.  Blood tests.  Monitoring your baby's heart rate.  Ultrasound. How is this treated? You and your health care provider will determine the treatment approach that is best for you. Treatment may include:  Having more frequent prenatal exams to check for signs of preeclampsia, if you have an increased risk for preeclampsia.  Medicine to lower your blood pressure.  Staying in the hospital, if your condition is severe. There, treatment will focus on controlling your blood pressure and the amount of fluids in your body (fluid retention).  Taking medicine (magnesium sulfate) to prevent seizures. This may be given as an injection or through an IV.  Taking a low-dose aspirin during your pregnancy.  Delivering your baby early. You may have your labor started with medicine (induced), or you may have a cesarean delivery. Follow these instructions at home: Eating and drinking   Drink enough fluid to keep your urine pale yellow.  Avoid caffeine. Lifestyle  Do not use any products that contain nicotine or tobacco, such as cigarettes and e-cigarettes. If you need help quitting, ask your health care provider.  Do not use alcohol or drugs.  Avoid stress as much as possible. Rest and  get plenty of sleep. General instructions  Take over-the-counter and prescription medicines only as told by your health care provider.  When lying down, lie on your left side. This keeps pressure  off your major blood vessels.  When sitting or lying down, raise (elevate) your feet. Try putting some pillows underneath your lower legs.  Exercise regularly. Ask your health care provider what kinds of exercise are best for you.  Keep all follow-up and prenatal visits as told by your health care provider. This is important. How is this prevented? There is no known way of preventing preeclampsia or eclampsia from developing. However, to lower your risk of complications and detect problems early:  Get regular prenatal care. Your health care provider may be able to diagnose and treat the condition early.  Maintain a healthy weight. Ask your health care provider for help managing weight gain during pregnancy.  Work with your health care provider to manage any long-term (chronic) health conditions you have, such as diabetes or kidney problems.  You may have tests of your blood pressure and kidney function after giving birth.  Your health care provider may have you take low-dose aspirin during your next pregnancy. Contact a health care provider if:  You have symptoms that your health care provider told you may require more treatment or monitoring, such as: ? Headaches. ? Nausea or vomiting. ? Abdominal pain. ? Dizziness. ? Light-headedness. Get help right away if:  You have severe: ? Abdominal pain. ? Headaches that do not get better. ? Dizziness. ? Vision problems. ? Confusion. ? Nausea or vomiting.  You have any of the following: ? A seizure. ? Sudden, rapid weight gain. ? Sudden swelling in your hands, ankles, or face. ? Trouble moving any part of your body. ? Numbness in any part of your body. ? Trouble speaking. ? Abnormal bleeding.  You faint. Summary  Preeclampsia is a serious condition that may develop during pregnancy.  This condition causes high blood pressure and increased protein in your urine along with other symptoms, such as headaches and vision changes.   Diagnosing and treating preeclampsia early is very important. If not treated early, it can cause serious problems for you and your baby.  Get help right away if you have symptoms that your health care provider told you to watch for. This information is not intended to replace advice given to you by your health care provider. Make sure you discuss any questions you have with your health care provider. Document Released: 12/02/2000 Document Revised: 08/07/2018 Document Reviewed: 07/11/2016 Elsevier Patient Education  2020 [employer].   Breastfeeding  Choosing to breastfeed is one of the best decisions you can make for yourself and your baby. A change in hormones during pregnancy causes your breasts to make breast milk in your milk-producing glands. Hormones prevent breast milk from being released before your baby is born. They also prompt milk flow after birth. Once breastfeeding has begun, thoughts of your baby, as well as his or her sucking or crying, can stimulate the release of milk from your milk-producing glands. Benefits of breastfeeding Research shows that breastfeeding offers many health benefits for infants and mothers. It also offers a cost-free and convenient way to feed your baby. For your baby  Your first milk (colostrum) helps your baby's digestive system to function better.  Special cells in your milk (antibodies) help your baby to fight off infections.  Breastfed babies are less likely to develop asthma, allergies, obesity, or type 2 diabetes.  They are also at lower risk for sudden infant death syndrome (SIDS).  Nutrients in breast milk are better able to meet your baby's needs compared to infant formula.  Breast milk improves your baby's brain development. For you  Breastfeeding helps to create a very special bond between you and your baby.  Breastfeeding is convenient. Breast milk costs nothing and is always available at the correct temperature.  Breastfeeding  helps to burn calories. It helps you to lose the weight that you gained during pregnancy.  Breastfeeding makes your uterus return faster to its size before pregnancy. It also slows bleeding (lochia) after you give birth.  Breastfeeding helps to lower your risk of developing type 2 diabetes, osteoporosis, rheumatoid arthritis, cardiovascular disease, and breast, ovarian, uterine, and endometrial cancer later in life. Breastfeeding basics Starting breastfeeding  Find a comfortable place to sit or lie down, with your neck and back well-supported.  Place a pillow or a rolled-up blanket under your baby to bring him or her to the level of your breast (if you are seated). Nursing pillows are specially designed to help support your arms and your baby while you breastfeed.  Make sure that your baby's tummy (abdomen) is facing your abdomen.  Gently massage your breast. With your fingertips, massage from the outer edges of your breast inward toward the nipple. This encourages milk flow. If your milk flows slowly, you may need to continue this action during the feeding.  Support your breast with 4 fingers underneath and your thumb above your nipple (make the letter "C" with your hand). Make sure your fingers are well away from your nipple and your baby's mouth.  Stroke your baby's lips gently with your finger or nipple.  When your baby's mouth is open wide enough, quickly bring your baby to your breast, placing your entire nipple and as much of the areola as possible into your baby's mouth. The areola is the colored area around your nipple. ? More areola should be visible above your baby's upper lip than below the lower lip. ? Your baby's lips should be opened and extended outward (flanged) to ensure an adequate, comfortable latch. ? Your baby's tongue should be between his or her lower gum and your breast.  Make sure that your baby's mouth is correctly positioned around your nipple (latched). Your  baby's lips should create a seal on your breast and be turned out (everted).  It is common for your baby to suck about 2-3 minutes in order to start the flow of breast milk. Latching Teaching your baby how to latch onto your breast properly is very important. An improper latch can cause nipple pain, decreased milk supply, and poor weight gain in your baby. Also, if your baby is not latched onto your nipple properly, he or she may swallow some air during feeding. This can make your baby fussy. Burping your baby when you switch breasts during the feeding can help to get rid of the air. However, teaching your baby to latch on properly is still the best way to prevent fussiness from swallowing air while breastfeeding. Signs that your baby has successfully latched onto your nipple  Silent tugging or silent sucking, without causing you pain. Infant's lips should be extended outward (flanged).  Swallowing heard between every 3-4 sucks once your milk has started to flow (after your let-down milk reflex occurs).  Muscle movement above and in front of his or her ears while sucking. Signs that your baby has not successfully latched  onto your nipple  Sucking sounds or smacking sounds from your baby while breastfeeding.  Nipple pain. If you think your baby has not latched on correctly, slip your finger into the corner of your baby's mouth to break the suction and place it between your baby's gums. Attempt to start breastfeeding again. Signs of successful breastfeeding Signs from your baby  Your baby will gradually decrease the number of sucks or will completely stop sucking.  Your baby will fall asleep.  Your baby's body will relax.  Your baby will retain a small amount of milk in his or her mouth.  Your baby will let go of your breast by himself or herself. Signs from you  Breasts that have increased in firmness, weight, and size 1-3 hours after feeding.  Breasts that are softer immediately  after breastfeeding.  Increased milk volume, as well as a change in milk consistency and color by the fifth day of breastfeeding.  Nipples that are not sore, cracked, or bleeding. Signs that your baby is getting enough milk  Wetting at least 1-2 diapers during the first 24 hours after birth.  Wetting at least 5-6 diapers every 24 hours for the first week after birth. The urine should be clear or pale yellow by the age of 5 days.  Wetting 6-8 diapers every 24 hours as your baby continues to grow and develop.  At least 3 stools in a 24-hour period by the age of 5 days. The stool should be soft and yellow.  At least 3 stools in a 24-hour period by the age of 7 days. The stool should be seedy and yellow.  No loss of weight greater than 10% of birth weight during the first 3 days of life.  Average weight gain of 4-7 oz (113-198 g) per week after the age of 4 days.  Consistent daily weight gain by the age of 5 days, without weight loss after the age of 2 weeks. After a feeding, your baby may spit up a small amount of milk. This is normal. Breastfeeding frequency and duration Frequent feeding will help you make more milk and can prevent sore nipples and extremely full breasts (breast engorgement). Breastfeed when you feel the need to reduce the fullness of your breasts or when your baby shows signs of hunger. This is called "breastfeeding on demand." Signs that your baby is hungry include:  Increased alertness, activity, or restlessness.  Movement of the head from side to side.  Opening of the mouth when the corner of the mouth or cheek is stroked (rooting).  Increased sucking sounds, smacking lips, cooing, sighing, or squeaking.  Hand-to-mouth movements and sucking on fingers or hands.  Fussing or crying. Avoid introducing a pacifier to your baby in the first 4-6 weeks after your baby is born. After this time, you may choose to use a pacifier. Research has shown that pacifier use  during the first year of a baby's life decreases the risk of sudden infant death syndrome (SIDS). Allow your baby to feed on each breast as long as he or she wants. When your baby unlatches or falls asleep while feeding from the first breast, offer the second breast. Because newborns are often sleepy in the first few weeks of life, you may need to awaken your baby to get him or her to feed. Breastfeeding times will vary from baby to baby. However, the following rules can serve as a guide to help you make sure that your baby is properly fed:  Newborns (babies 39 weeks of age or younger) may breastfeed every 1-3 hours.  Newborns should not go without breastfeeding for longer than 3 hours during the day or 5 hours during the night.  You should breastfeed your baby a minimum of 8 times in a 24-hour period. Breast milk pumping     Pumping and storing breast milk allows you to make sure that your baby is exclusively fed your breast milk, even at times when you are unable to breastfeed. This is especially important if you go back to work while you are still breastfeeding, or if you are not able to be present during feedings. Your lactation consultant can help you find a method of pumping that works best for you and give you guidelines about how long it is safe to store breast milk. Caring for your breasts while you breastfeed Nipples can become dry, cracked, and sore while breastfeeding. The following recommendations can help keep your breasts moisturized and healthy:  Avoid using soap on your nipples.  Wear a supportive bra designed especially for nursing. Avoid wearing underwire-style bras or extremely tight bras (sports bras).  Air-dry your nipples for 3-4 minutes after each feeding.  Use only cotton bra pads to absorb leaked breast milk. Leaking of breast milk between feedings is normal.  Use lanolin on your nipples after breastfeeding. Lanolin helps to maintain your skin's normal moisture  barrier. Pure lanolin is not harmful (not toxic) to your baby. You may also hand express a few drops of breast milk and gently massage that milk into your nipples and allow the milk to air-dry. In the first few weeks after giving birth, some women experience breast engorgement. Engorgement can make your breasts feel heavy, warm, and tender to the touch. Engorgement peaks within 3-5 days after you give birth. The following recommendations can help to ease engorgement:  Completely empty your breasts while breastfeeding or pumping. You may want to start by applying warm, moist heat (in the shower or with warm, water-soaked hand towels) just before feeding or pumping. This increases circulation and helps the milk flow. If your baby does not completely empty your breasts while breastfeeding, pump any extra milk after he or she is finished.  Apply ice packs to your breasts immediately after breastfeeding or pumping, unless this is too uncomfortable for you. To do this: ? Put ice in a plastic bag. ? Place a towel between your skin and the bag. ? Leave the ice on for 20 minutes, 2-3 times a day.  Make sure that your baby is latched on and positioned properly while breastfeeding. If engorgement persists after 48 hours of following these recommendations, contact your health care provider or a Science writer. Overall health care recommendations while breastfeeding  Eat 3 healthy meals and 3 snacks every day. Well-nourished mothers who are breastfeeding need an additional 450-500 calories a day. You can meet this requirement by increasing the amount of a balanced diet that you eat.  Drink enough water to keep your urine pale yellow or clear.  Rest often, relax, and continue to take your prenatal vitamins to prevent fatigue, stress, and low vitamin and mineral levels in your body (nutrient deficiencies).  Do not use any products that contain nicotine or tobacco, such as cigarettes and e-cigarettes. Your  baby may be harmed by chemicals from cigarettes that pass into breast milk and exposure to secondhand smoke. If you need help quitting, ask your health care provider.  Avoid alcohol.  Do not use illegal  drugs or marijuana.  Talk with your health care provider before taking any medicines. These include over-the-counter and prescription medicines as well as vitamins and herbal supplements. Some medicines that may be harmful to your baby can pass through breast milk.  It is possible to become pregnant while breastfeeding. If birth control is desired, ask your health care provider about options that will be safe while breastfeeding your baby. Where to find more information: [employer]: www.llli.org Contact a health care provider if:  You feel like you want to stop breastfeeding or have become frustrated with breastfeeding.  Your nipples are cracked or bleeding.  Your breasts are red, tender, or warm.  You have: ? Painful breasts or nipples. ? A swollen area on either breast. ? A fever or chills. ? Nausea or vomiting. ? Drainage other than breast milk from your nipples.  Your breasts do not become full before feedings by the fifth day after you give birth.  You feel sad and depressed.  Your baby is: ? Too sleepy to eat well. ? Having trouble sleeping. ? More than 23 week old and wetting fewer than 6 diapers in a 24-hour period. ? Not gaining weight by 102 days of age.  Your baby has fewer than 3 stools in a 24-hour period.  Your baby's skin or the white parts of his or her eyes become yellow. Get help right away if:  Your baby is overly tired (lethargic) and does not want to wake up and feed.  Your baby develops an unexplained fever. Summary  Breastfeeding offers many health benefits for infant and mothers.  Try to breastfeed your infant when he or she shows early signs of hunger.  Gently tickle or stroke your baby's lips with your finger or nipple to  allow the baby to open his or her mouth. Bring the baby to your breast. Make sure that much of the areola is in your baby's mouth. Offer one side and burp the baby before you offer the other side.  Talk with your health care provider or lactation consultant if you have questions or you face problems as you breastfeed. This information is not intended to replace advice given to you by your health care provider. Make sure you discuss any questions you have with your health care provider. Document Released: 12/05/2005 Document Revised: 03/01/2018 Document Reviewed: 01/06/2017 Elsevier Patient Education  2020 [employer].

## 2019-10-16 NOTE — Progress Notes (Signed)
   PRENATAL VISIT NOTE  Subjective:  Jessica [name] is a 23 y.o. [phone] at 93w2dbeing seen today for ongoing prenatal care.  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  Patient reports headache and Crohn's flare with diarrhea, abdominal pain. .  Contractions: Irregular. Vag. Bleeding: None.  Movement: Present. Denies leaking of fluid.   The following portions of the patient's history were reviewed and updated as appropriate: allergies, current medications, past family history, past medical history, past social history, past surgical history and problem list.   Objective:   Vitals:   10/16/19 1332 10/16/19 1356  BP: (!) 142/93 (!) 132/92  Pulse: 81   Weight: 203 lb (92.1 kg)     Fetal Status: Fetal Heart Rate (bpm): 155 Fundal Height: 32 cm Movement: Present  Presentation: Vertex  General:  Alert, oriented and cooperative. Patient is in no acute distress.  Skin: Skin is warm and dry. No rash noted.   Cardiovascular: Normal heart rate noted  Respiratory: Normal respiratory effort, no problems with respiration noted  Abdomen: Soft, gravid, appropriate for gestational age.  Pain/Pressure: Present     Pelvic: Cervical exam performed Dilation: 2 Effacement (%): Thick Station: -2  Extremities: Normal range of motion.     Mental Status: Normal mood and affect. Normal behavior. Normal judgment and thought content.   Assessment and Plan:  Pregnancy: [id number]. Maternal Crohn's  disease affecting pregnancy in third trimester ([facility]. May get  Back on biologics once baby is out.  2. Supervision of other normal pregnancy, antepartum Continue prenatal care.   3. Elevated blood pressure affecting pregnancy, antepartum Check labs, precautions reviewed. Has BP cuff at home and can re-check BPs as needed. - CBC - Comprehensive metabolic panel - Protein / creatinine ratio, urine  Preterm labor symptoms and general obstetric precautions including but not limited to vaginal bleeding, contractions, leaking of fluid and fetal movement were reviewed in detail with the patient. Please refer to After Visit Summary for other counseling recommendations.   Return in 4 weeks (on 11/13/2019).  Future Appointments  Date Time Provider [place]10/30/2020  8:00 AM [facility] SCHED ROOM [facility] None    TaDonnamae JudeMD

## 2019-10-16 NOTE — Progress Notes (Signed)
Pt would like cervix check today.   BP elevated today, pt states HA x 2 days. Some relief with Tylenol. Denies any visual changes.

## 2019-10-17 ENCOUNTER — Other Ambulatory Visit (HOSPITAL_COMMUNITY)
Admission: RE | Admit: 2019-10-17 | Discharge: 2019-10-17 | Disposition: A | Discharge status: Home / Self Care | Payer: Medicaid Other | Source: Ambulatory Visit | Attending: Obstetrics and Gynecology | Admitting: Obstetrics and Gynecology

## 2019-10-17 DIAGNOSIS — Z20828 Contact with and (suspected) exposure to other viral communicable diseases: Secondary | ICD-10-CM | POA: Insufficient documentation

## 2019-10-17 LAB — COMPREHENSIVE METABOLIC PANEL
ALT: 7 IU/L (ref 0–32)
AST: 13 IU/L (ref 0–40)
Albumin/Globulin Ratio: 1.2 (ref 1.2–2.2)
Albumin: 3.7 g/dL — ABNORMAL LOW (ref 3.9–5.0)
Alkaline Phosphatase: 164 IU/L — ABNORMAL HIGH (ref 39–117)
BUN/Creatinine Ratio: 12 (ref 9–23)
BUN: 6 mg/dL (ref 6–20)
Bilirubin Total: 0.3 mg/dL (ref 0.0–1.2)
CO2: 19 mmol/L — ABNORMAL LOW (ref 20–29)
Calcium: 8.9 mg/dL (ref 8.7–10.2)
Chloride: 107 mmol/L — ABNORMAL HIGH (ref 96–106)
Creatinine, Ser: 0.5 mg/dL — ABNORMAL LOW (ref 0.57–1.00)
GFR calc Af Amer: 157 mL/min/{1.73_m2} (ref >59)
GFR calc non Af Amer: 136 mL/min/{1.73_m2} (ref >59)
Globulin, Total: 3.1 g/dL (ref 1.5–4.5)
Glucose: 75 mg/dL (ref 65–99)
Potassium: 4.1 mmol/L (ref 3.5–5.2)
Sodium: 138 mmol/L (ref 134–144)
Total Protein: 6.8 g/dL (ref 6.0–8.5)

## 2019-10-17 LAB — CBC
Hematocrit: 32.5 % — ABNORMAL LOW (ref 34.0–46.6)
Hemoglobin: 10.1 g/dL — ABNORMAL LOW (ref 11.1–15.9)
MCH: 24 pg — ABNORMAL LOW (ref 26.6–33.0)
MCHC: 31.1 g/dL — ABNORMAL LOW (ref 31.5–35.7)
MCV: 77 fL — ABNORMAL LOW (ref 79–97)
Platelets: 271 10*3/uL (ref 150–450)
RBC: 4.21 x10E6/uL (ref 3.77–5.28)
RDW: 13 % (ref 11.7–15.4)
WBC: 4.9 10*3/uL (ref 3.4–10.8)

## 2019-10-17 LAB — PROTEIN / CREATININE RATIO, URINE
Creatinine, Urine: 131.7 mg/dL
Protein, Ur: 50.4 mg/dL
Protein/Creat Ratio: 383 mg/g creat — ABNORMAL HIGH (ref 0–200)

## 2019-10-17 LAB — SARS CORONAVIRUS 2 (TAT 6-24 HRS): SARS Coronavirus 2: NEGATIVE

## 2019-10-17 NOTE — [facility] Provider Note (Signed)
Swab collected without difficulty.

## 2019-10-18 ENCOUNTER — Inpatient Hospital Stay (HOSPITAL_COMMUNITY): Payer: Medicaid Other | Admitting: Anesthesiology

## 2019-10-18 ENCOUNTER — Inpatient Hospital Stay (HOSPITAL_COMMUNITY): Payer: Medicaid Other

## 2019-10-18 ENCOUNTER — Encounter (HOSPITAL_COMMUNITY): Payer: Self-pay

## 2019-10-18 ENCOUNTER — Inpatient Hospital Stay (HOSPITAL_COMMUNITY)
Admission: AD | Admit: 2019-10-18 | Discharge: 2019-10-20 | DRG: 806 | Disposition: A | Discharge status: Home / Self Care | Payer: Medicaid Other | Attending: Obstetrics and Gynecology | Admitting: Obstetrics and Gynecology

## 2019-10-18 ENCOUNTER — Other Ambulatory Visit: Payer: Self-pay

## 2019-10-18 DIAGNOSIS — Z3A39 39 weeks gestation of pregnancy: Secondary | ICD-10-CM | POA: Diagnosis not present

## 2019-10-18 DIAGNOSIS — O1494 Unspecified pre-eclampsia, complicating childbirth: Secondary | ICD-10-CM | POA: Diagnosis not present

## 2019-10-18 DIAGNOSIS — O9902 Anemia complicating childbirth: Secondary | ICD-10-CM | POA: Diagnosis present

## 2019-10-18 DIAGNOSIS — D649 Anemia, unspecified: Secondary | ICD-10-CM | POA: Diagnosis present

## 2019-10-18 DIAGNOSIS — O9962 Diseases of the digestive system complicating childbirth: Secondary | ICD-10-CM | POA: Diagnosis present

## 2019-10-18 DIAGNOSIS — O134 Gestational [pregnancy-induced] hypertension without significant proteinuria, complicating childbirth: Secondary | ICD-10-CM | POA: Diagnosis present

## 2019-10-18 DIAGNOSIS — K5090 Crohn's disease, unspecified, without complications: Secondary | ICD-10-CM | POA: Diagnosis present

## 2019-10-18 DIAGNOSIS — Z20828 Contact with and (suspected) exposure to other viral communicable diseases: Secondary | ICD-10-CM | POA: Diagnosis present

## 2019-10-18 DIAGNOSIS — Z283 Underimmunization status: Secondary | ICD-10-CM

## 2019-10-18 DIAGNOSIS — O98819 Other maternal infectious and parasitic diseases complicating pregnancy, unspecified trimester: Secondary | ICD-10-CM

## 2019-10-18 DIAGNOSIS — O09899 Supervision of other high risk pregnancies, unspecified trimester: Secondary | ICD-10-CM

## 2019-10-18 DIAGNOSIS — Z23 Encounter for immunization: Secondary | ICD-10-CM | POA: Diagnosis not present

## 2019-10-18 DIAGNOSIS — Z2839 Other underimmunization status: Secondary | ICD-10-CM

## 2019-10-18 DIAGNOSIS — O169 Unspecified maternal hypertension, unspecified trimester: Secondary | ICD-10-CM | POA: Diagnosis present

## 2019-10-18 DIAGNOSIS — O1490 Unspecified pre-eclampsia, unspecified trimester: Secondary | ICD-10-CM

## 2019-10-18 DIAGNOSIS — A749 Chlamydial infection, unspecified: Secondary | ICD-10-CM

## 2019-10-18 DIAGNOSIS — Z8759 Personal history of other complications of pregnancy, childbirth and the puerperium: Secondary | ICD-10-CM

## 2019-10-18 DIAGNOSIS — O99613 Diseases of the digestive system complicating pregnancy, third trimester: Secondary | ICD-10-CM | POA: Diagnosis not present

## 2019-10-18 DIAGNOSIS — Z9049 Acquired absence of other specified parts of digestive tract: Secondary | ICD-10-CM

## 2019-10-18 DIAGNOSIS — Z3480 Encounter for supervision of other normal pregnancy, unspecified trimester: Secondary | ICD-10-CM

## 2019-10-18 LAB — COMPREHENSIVE METABOLIC PANEL
ALT: 9 U/L (ref 0–44)
AST: 15 U/L (ref 15–41)
Albumin: 2.6 g/dL — ABNORMAL LOW (ref 3.5–5.0)
Alkaline Phosphatase: 130 U/L — ABNORMAL HIGH (ref 38–126)
Anion gap: 11 (ref 5–15)
BUN: 5 mg/dL — ABNORMAL LOW (ref 6–20)
CO2: 18 mmol/L — ABNORMAL LOW (ref 22–32)
Calcium: 8.6 mg/dL — ABNORMAL LOW (ref 8.9–10.3)
Chloride: 106 mmol/L (ref 98–111)
Creatinine, Ser: 0.54 mg/dL (ref 0.44–1.00)
GFR calc Af Amer: >60 mL/min (ref >60)
GFR calc non Af Amer: >60 mL/min (ref >60)
Glucose, Bld: 106 mg/dL — ABNORMAL HIGH (ref 70–99)
Potassium: 3.1 mmol/L — ABNORMAL LOW (ref 3.5–5.1)
Sodium: 135 mmol/L (ref 135–145)
Total Bilirubin: 0.7 mg/dL (ref 0.3–1.2)
Total Protein: 6.3 g/dL — ABNORMAL LOW (ref 6.5–8.1)

## 2019-10-18 LAB — CBC
HCT: 28.6 % — ABNORMAL LOW (ref 36.0–46.0)
HCT: 27.2 % — ABNORMAL LOW (ref 36.0–46.0)
Hemoglobin: 8.8 g/dL — ABNORMAL LOW (ref 12.0–15.0)
Hemoglobin: 8.4 g/dL — ABNORMAL LOW (ref 12.0–15.0)
MCH: 24.3 pg — ABNORMAL LOW (ref 26.0–34.0)
MCH: 24.3 pg — ABNORMAL LOW (ref 26.0–34.0)
MCHC: 30.8 g/dL (ref 30.0–36.0)
MCHC: 30.9 g/dL (ref 30.0–36.0)
MCV: 79 fL — ABNORMAL LOW (ref 80.0–100.0)
MCV: 78.6 fL — ABNORMAL LOW (ref 80.0–100.0)
Platelets: 226 10*3/uL (ref 150–400)
Platelets: 238 10*3/uL (ref 150–400)
RBC: 3.62 MIL/uL — ABNORMAL LOW (ref 3.87–5.11)
RBC: 3.46 MIL/uL — ABNORMAL LOW (ref 3.87–5.11)
RDW: 13.2 % (ref 11.5–15.5)
RDW: 13.3 % (ref 11.5–15.5)
WBC: 6.1 10*3/uL (ref 4.0–10.5)
WBC: 10.7 10*3/uL — ABNORMAL HIGH (ref 4.0–10.5)
nRBC: 0 % (ref 0.0–0.2)
nRBC: 0 % (ref 0.0–0.2)

## 2019-10-18 LAB — TYPE AND SCREEN
ABO/RH(D): AB POS
Antibody Screen: NEGATIVE

## 2019-10-18 LAB — RPR: RPR Ser Ql: NONREACTIVE

## 2019-10-18 LAB — PROTEIN / CREATININE RATIO, URINE
Creatinine, Urine: 446.03 mg/dL
Protein Creatinine Ratio: 0.39 mg/mg{Cre} — ABNORMAL HIGH (ref 0.00–0.15)
Total Protein, Urine: 175 mg/dL

## 2019-10-18 LAB — ABO/RH: ABO/RH(D): AB POS

## 2019-10-18 MED ORDER — PHENYLEPHRINE 40 MCG/ML (10ML) SYRINGE FOR IV PUSH (FOR BLOOD PRESSURE SUPPORT): 80.0000 ug | PREFILLED_SYRINGE | INTRAVENOUS | Status: DC | PRN

## 2019-10-18 MED ORDER — TETANUS-DIPHTH-ACELL PERTUSSIS 5-2.5-18.5 LF-MCG/0.5 IM SUSP: 0.5000 mL | Freq: Once | INTRAMUSCULAR | Status: DC

## 2019-10-18 MED ORDER — PHENYLEPHRINE 40 MCG/ML (10ML) SYRINGE FOR IV PUSH (FOR BLOOD PRESSURE SUPPORT)
80.0000 ug | PREFILLED_SYRINGE | INTRAVENOUS | Status: DC | PRN
  Filled 2019-10-18: qty 10

## 2019-10-18 MED ORDER — EPHEDRINE 5 MG/ML INJ: 10.0000 mg | INTRAVENOUS | Status: DC | PRN

## 2019-10-18 MED ORDER — COCONUT OIL OIL: 1.0000 "application " | TOPICAL_OIL | Status: DC | PRN

## 2019-10-18 MED ORDER — DIBUCAINE (PERIANAL) 1 % EX OINT: 1.0000 "application " | TOPICAL_OINTMENT | CUTANEOUS | Status: DC | PRN

## 2019-10-18 MED ORDER — OXYCODONE-ACETAMINOPHEN 5-325 MG PO TABS: 1.0000 | ORAL_TABLET | ORAL | Status: DC | PRN

## 2019-10-18 MED ORDER — TERBUTALINE SULFATE 1 MG/ML IJ SOLN: 0.2500 mg | Freq: Once | INTRAMUSCULAR | Status: DC | PRN

## 2019-10-18 MED ORDER — OXYTOCIN BOLUS FROM INFUSION
500.0000 mL | Freq: Once | INTRAVENOUS | Status: AC
  Administered 2019-10-18: 500 mL via INTRAVENOUS

## 2019-10-18 MED ORDER — MEDROXYPROGESTERONE ACETATE 150 MG/ML IM SUSP
150.0000 mg | Freq: Once | INTRAMUSCULAR | Status: AC
  Administered 2019-10-20: 150 mg via INTRAMUSCULAR
  Filled 2019-10-18: qty 1

## 2019-10-18 MED ORDER — LACTATED RINGERS IV SOLN: 500.0000 mL | Freq: Once | INTRAVENOUS | Status: DC

## 2019-10-18 MED ORDER — ZOLPIDEM TARTRATE 5 MG PO TABS: 5.0000 mg | ORAL_TABLET | Freq: Every evening | ORAL | Status: DC | PRN

## 2019-10-18 MED ORDER — OXYCODONE-ACETAMINOPHEN 5-325 MG PO TABS: 2.0000 | ORAL_TABLET | ORAL | Status: DC | PRN

## 2019-10-18 MED ORDER — BENZOCAINE-MENTHOL 20-0.5 % EX AERO: 1.0000 "application " | INHALATION_SPRAY | CUTANEOUS | Status: DC | PRN

## 2019-10-18 MED ORDER — OXYTOCIN 40 UNITS IN NORMAL SALINE INFUSION - SIMPLE MED
2.5000 [IU]/h | INTRAVENOUS | Status: DC
  Filled 2019-10-18: qty 1000

## 2019-10-18 MED ORDER — MEASLES, MUMPS & RUBELLA VAC IJ SOLR: 0.5000 mL | Freq: Once | INTRAMUSCULAR | Status: DC

## 2019-10-18 MED ORDER — SIMETHICONE 80 MG PO CHEW: 80.0000 mg | CHEWABLE_TABLET | ORAL | Status: DC | PRN

## 2019-10-18 MED ORDER — PRENATAL MULTIVITAMIN CH
1.0000 | ORAL_TABLET | Freq: Every day | ORAL | Status: DC
  Administered 2019-10-19 – 2019-10-20 (×2): 1 via ORAL
  Filled 2019-10-18 (×2): qty 1

## 2019-10-18 MED ORDER — FENTANYL CITRATE (PF) 100 MCG/2ML IJ SOLN
100.0000 ug | INTRAMUSCULAR | Status: DC | PRN
  Administered 2019-10-18: 100 ug via INTRAVENOUS
  Filled 2019-10-18: qty 2

## 2019-10-18 MED ORDER — OXYTOCIN 40 UNITS IN NORMAL SALINE INFUSION - SIMPLE MED: 1.0000 m[IU]/min | INTRAVENOUS | Status: DC

## 2019-10-18 MED ORDER — ONDANSETRON HCL 4 MG PO TABS: 4.0000 mg | ORAL_TABLET | ORAL | Status: DC | PRN

## 2019-10-18 MED ORDER — INFLUENZA VAC SPLIT QUAD 0.5 ML IM SUSY
0.5000 mL | PREFILLED_SYRINGE | INTRAMUSCULAR | Status: AC
  Administered 2019-10-19: 16:00:00 0.5 mL via INTRAMUSCULAR
  Filled 2019-10-18: qty 0.5

## 2019-10-18 MED ORDER — SOD CITRATE-CITRIC ACID 500-334 MG/5ML PO SOLN: 30.0000 mL | ORAL | Status: DC | PRN

## 2019-10-18 MED ORDER — DIPHENHYDRAMINE HCL 50 MG/ML IJ SOLN: 12.5000 mg | INTRAMUSCULAR | Status: DC | PRN

## 2019-10-18 MED ORDER — OXYCODONE HCL 5 MG PO TABS
5.0000 mg | ORAL_TABLET | ORAL | Status: DC | PRN
  Administered 2019-10-18 – 2019-10-19 (×3): 5 mg via ORAL
  Filled 2019-10-18 (×3): qty 1

## 2019-10-18 MED ORDER — MISOPROSTOL 50MCG HALF TABLET
50.0000 ug | ORAL_TABLET | ORAL | Status: DC
  Administered 2019-10-18: 08:00:00 50 ug via BUCCAL
  Filled 2019-10-18: qty 1

## 2019-10-18 MED ORDER — LIDOCAINE HCL (PF) 1 % IJ SOLN: 30.0000 mL | INTRAMUSCULAR | Status: DC | PRN

## 2019-10-18 MED ORDER — WITCH HAZEL-GLYCERIN EX PADS: 1.0000 "application " | MEDICATED_PAD | CUTANEOUS | Status: DC | PRN

## 2019-10-18 MED ORDER — LACTATED RINGERS IV SOLN
INTRAVENOUS | Status: DC
  Administered 2019-10-18: 08:00:00 via INTRAVENOUS

## 2019-10-18 MED ORDER — IBUPROFEN 600 MG PO TABS
600.0000 mg | ORAL_TABLET | Freq: Four times a day (QID) | ORAL | Status: DC
  Filled 2019-10-18: qty 1

## 2019-10-18 MED ORDER — ONDANSETRON HCL 4 MG/2ML IJ SOLN: 4.0000 mg | INTRAMUSCULAR | Status: DC | PRN

## 2019-10-18 MED ORDER — DIPHENHYDRAMINE HCL 25 MG PO CAPS: 25.0000 mg | ORAL_CAPSULE | Freq: Four times a day (QID) | ORAL | Status: DC | PRN

## 2019-10-18 MED ORDER — ACETAMINOPHEN 325 MG PO TABS
650.0000 mg | ORAL_TABLET | ORAL | Status: DC | PRN
  Administered 2019-10-18 – 2019-10-20 (×7): 650 mg via ORAL
  Filled 2019-10-18 (×7): qty 2

## 2019-10-18 MED ORDER — ONDANSETRON HCL 4 MG/2ML IJ SOLN: 4.0000 mg | Freq: Four times a day (QID) | INTRAMUSCULAR | Status: DC | PRN

## 2019-10-18 MED ORDER — LACTATED RINGERS IV SOLN: 500.0000 mL | INTRAVENOUS | Status: DC | PRN

## 2019-10-18 MED ORDER — SENNOSIDES-DOCUSATE SODIUM 8.6-50 MG PO TABS
2.0000 | ORAL_TABLET | ORAL | Status: DC
  Administered 2019-10-19 – 2019-10-20 (×2): 2 via ORAL
  Filled 2019-10-18 (×2): qty 2

## 2019-10-18 MED ORDER — MISOPROSTOL 25 MCG QUARTER TABLET: 25.0000 ug | ORAL_TABLET | ORAL | Status: DC | PRN

## 2019-10-18 MED ORDER — FENTANYL-BUPIVACAINE-NACL 0.5-0.125-0.9 MG/250ML-% EP SOLN
12.0000 mL/h | EPIDURAL | Status: DC | PRN
  Filled 2019-10-18: qty 250

## 2019-10-18 MED ORDER — ACETAMINOPHEN 325 MG PO TABS: 650.0000 mg | ORAL_TABLET | ORAL | Status: DC | PRN

## 2019-10-18 MED ORDER — FLEET ENEMA 7-19 GM/118ML RE ENEM: 1.0000 | ENEMA | RECTAL | Status: DC | PRN

## 2019-10-18 NOTE — Discharge Summary (Addendum)
Postpartum Discharge Summary     Patient Name: Jessica [name] DOB: [birth year] MRN: [id number]  Date of admission: 10/18/2019 Delivering Provider: Merilyn Baba   Date of discharge: 10/20/2019  Admitting diagnosis: Pregnancy  Intrauterine pregnancy: [redacted]w[redacted]d    Secondary diagnosis:  Active Problems:   Crohn's disease ([place]   Maternal Crohn's disease affecting pregnancy (HEl [place]   Rubella non-immune status, antepartum   Elevated blood pressure affecting pregnancy, antepartum   [redacted] weeks gestation of pregnancy   Pre-eclampsia  Additional problems: None     Discharge diagnosis: Term Pregnancy Delivered                                                                                                Post partum procedures:None  Augmentation: Cytotec and Foley Balloon  Complications: None  Hospital course:  Induction of Labor With Vaginal Delivery   23y.o. yo [id number] 326w4das admitted to the hospital 10/18/2019 for induction of labor.  Indication for induction: Crohn's flare.  Patient had an uncomplicated labor course as follows:  She was initially induced with FB and cytotec. She progressed on her own after FB came out, [facility]  Membrane Rupture Time/Date: 1:25 PM ,10/18/2019   Intrapartum Procedures: Episiotomy: None [1]                                         Lacerations:  None [1]  Patient had delivery of a Viable infant.  Information for the patient's newborn:  ThWalsie, Smeltzirl ZyShelton0[[id number]]Delivery Method: Vag-Spont    10/18/2019  Details of delivery can be found in separate delivery note. Patient diagnosed with Pre-E without severe features. She was started on Vasotec 5 mg which was ultimately increased to 10 mg with better control. She owns a BP cuff at home and parameters given. Patient with Crohns flare and having bloody diarrhea. She plans to follow-up with GI this week for biologic medication and possible steroid  taper. Oxycodone prescribed on discharge. Received Depo and would like interval BTL. Mild elevated temperature on 11/1 due to heating pad; resolved when heating pad removed and patient did not feel chilled. Patient is discharged home 10/20/19. Delivery time: 1:29 PM    Magnesium Sulfate received: No BMZ received: No Rhophylac:N/A MMR:No; patient with Crohns and wants to check with GI first Transfusion:No  Physical exam  Vitals:   10/20/19 0025 10/20/19 0141 10/20/19 0200 10/20/19 0637  BP: (!) 139/101 121/89 (!) 133/92 (!) 123/97  Pulse: 85 87 78 78  Resp: _0 Temp: (!) 100.5 F (38.1 C) 100 F (37.8 C) 99 F (37.2 C) 98.4 F (36.9 C)  TempSrc: Oral Oral Oral Oral  SpO2: 100% 100% 100% 100%  Weight:      Height:       General: alert, cooperative and no distress Lochia: appropriate Uterine Fundus: firm DVT Evaluation: No evidence of DVT seen on physical exam. Labs: Lab Results  Component Value  Date   WBC 10.7 (H) 10/18/2019   HGB 8.4 (L) 10/18/2019   HCT 27.2 (L) 10/18/2019   MCV 78.6 (L) 10/18/2019   PLT 238 10/18/2019   CMP Latest Ref Rng & Units 10/19/2019  Glucose 70 - 99 mg/dL 82  BUN 6 - 20 mg/dL 6  Creatinine 0.44 - 1.00 mg/dL 0.55  Sodium 135 - 145 mmol/L 137  Potassium 3.5 - 5.1 mmol/L 3.9  Chloride 98 - 111 mmol/L 106  CO2 22 - 32 mmol/L 23  Calcium 8.9 - 10.3 mg/dL 9.0  Total Protein 6.5 - 8.1 g/dL 6.0(L)  Total Bilirubin 0.3 - 1.2 mg/dL 0.3  Alkaline Phos 38 - 126 U/L 102  AST 15 - 41 U/L 14(L)  ALT 0 - 44 U/L 10    Discharge instruction: per After Visit Summary and "Baby and Me Booklet".  After visit meds:  Allergies as of 10/20/2019   No Known Allergies     Medication List    STOP taking these medications   acetaminophen 500 MG tablet Commonly known as: TYLENOL   cephALEXin 500 MG capsule Commonly known as: KEFLEX   cyclobenzaprine 10 MG tablet Commonly known as: FLEXERIL   promethazine 12.5 MG tablet Commonly known as:  PHENERGAN   vedolizumab 300 MG injection Commonly known as: ENTYVIO     TAKE these medications   enalapril 10 MG tablet Commonly known as: VASOTEC Take 1 tablet (10 mg total) by mouth daily.   Oxycodone HCl 10 MG Tabs Take 0.5 tablets (5 mg total) by mouth every 4 (four) hours as needed for moderate pain or severe pain.   predniSONE 5 MG tablet Commonly known as: DELTASONE Take 4 tablets (20 mg total) by mouth daily with breakfast. Take 24m daily for 2 weeks; then taper by 5 mg a week until done   Vitafol FE+ 90-1-200 & 50 MG Cppk Take 1 tablet by mouth daily.   Vitamin D (Ergocalciferol) 1.25 MG (50000 UT) Caps capsule Commonly known as: DRISDOL Take 1 capsule (50,000 Units total) by mouth every 7 (seven) days.       Diet: routine diet  Activity: Advance as tolerated. Pelvic rest for 6 weeks.   Outpatient follow up:4 weeks Follow up Appt:No future appointments. Follow up Visit: Please schedule this patient for Postpartum visit in: 4 weeks with the following provider: MD For C/S patients schedule nurse incision check in weeks 2 weeks: no High risk pregnancy complicated by: Crohn's flare, hx of colectomy Delivery mode:  SVD Anticipated Birth Control:  interval BTL, please schedule with MD provider PP Procedures needed: needs to sign BTL papers  Schedule Integrated BH visit: no  Newborn Data: Live born female  Birth Weight:  3325g APGAR: 924, 9   Newborn Delivery   Birth date/time: 10/18/2019 13:29:00 Delivery type: Vaginal, Spontaneous      Baby Feeding: Bottle and Breast Disposition:home with mother   10/20/2019 CChauncey Mann MD

## 2019-10-18 NOTE — H&P (Addendum)
LABOR AND DELIVERY ADMISSION HISTORY AND PHYSICAL NOTE  Jessica [name] is a 23 y.o. female [phone] with IUP at 46w4dby LMP presenting for IOL for Crohn's disease affecting third trimester of pregnancy and elevated blood pressures in pregnancy.   She reports positive fetal movement. She denies leakage of fluid or vaginal bleeding.  Prenatal History/Complications: Maternal Crohn's Hx of gHTN Hx of colectomy   Past Medical History: Past Medical History:  Diagnosis Date  . Anemia   . Crohn's disease ([place]   . Osteoporosis   . Supervision of normal first pregnancy 05/03/2013  . Vitamin D deficiency     Past Surgical History: Past Surgical History:  Procedure Laterality Date  . COLONOSCOPY N/A 01/22/2015   Procedure: COLONOSCOPY;  Surgeon: DLafayette Dragon MD;  [facility];  Service: Endoscopy;  Laterality: N/A;  . DILATION AND EVACUATION N/A 10/31/2014   Procedure: DILATATION AND EVACUATION;  Surgeon: LLahoma Crocker MD;  [facility];  Service: Gynecology;  Laterality: N/A;  . ESOPHAGOGASTRODUODENOSCOPY N/A 01/22/2015   Procedure: ESOPHAGOGASTRODUODENOSCOPY (EGD);  Surgeon: DLafayette Dragon MD;  [facility];  Service: Endoscopy;  Laterality: N/A;  . FLEXIBLE SIGMOIDOSCOPY N/A 11/04/2014   Procedure: FLEXIBLE SIGMOIDOSCOPY;  Surgeon: DLafayette Dragon MD;  [facility];  Service: Endoscopy;  Laterality: N/A;  . ILEOSTOMY CLOSURE N/A 12/18/2014   Procedure: LOOP ILEOSTOMY REVERSAL;  Surgeon: ALeighton Ruff MD;  Location: [facility];  Service: General;  Laterality: N/A;  . INCISION AND DRAINAGE Left    arm  . SMALL INTESTINE SURGERY    . WISDOM TOOTH EXTRACTION      Obstetrical History: OB History    Gravida  4   Para  2   Term  2   Preterm      AB  1   Living  2     SAB  1   TAB      Ectopic      Multiple  0   Live Births  2           Social History: Social History   Socioeconomic History  . Marital status: Single     Spouse name: Not on file  . Number of children: 1  . Years of education: Not on file  . Highest education level: Not on file  Occupational History  . Occupation: homemaker  Social Needs  . Financial resource strain: Not on file  . Food insecurity    Worry: Not on file    Inability: Not on file  . Transportation needs    Medical: Not on file    Non-medical: Not on file  Tobacco Use  . Smoking status: Never Smoker  . Smokeless tobacco: Never Used  . Tobacco comment: occasional cigarette  Substance and Sexual Activity  . Alcohol use: No    Alcohol/week: 0.0 standard drinks  . Drug use: No  . Sexual activity: Yes    Partners: Male  Lifestyle  . Physical activity    Days per week: Not on file    Minutes per session: Not on file  . Stress: Not on file  Relationships  . Social c[place] phone: Not on file    Gets together: Not on file    Attends religious service: Not on file    Active member of club or organization: Not on file    Attends meetings of clubs or organizations: Not on file    Relationship status: Not on file  Other Topics Concern  . Not on file  Social History Narrative  . Not on file    Family History: Family History  Problem Relation Age of Onset  . Diabetes Father   . Diabetes Maternal Grandmother   . Heart disease Maternal Grandmother   . Hypertension Maternal Grandmother   . Kidney disease Maternal Grandmother   . Lung cancer Maternal Grandfather   . Hyperlipidemia Maternal Grandfather   . Stroke Neg Hx   . Colon cancer Neg Hx     Allergies: No Known Allergies  Facility-Administered Medications Prior to Admission  Medication Dose Route Frequency Provider Last Rate Last Dose  . vedolizumab (ENTYVIO) 300 mg in sodium chloride 0.9 % 250 mL infusion  300 mg Intravenous Once Armbruster, Carlota Raspberry, MD       Medications Prior to Admission  Medication Sig Dispense Refill Last Dose  . acetaminophen (TYLENOL) 500 MG tablet Take 1,000 mg  by mouth every 6 (six) hours as needed for headache (pain).     . cephALEXin (KEFLEX) 500 MG capsule Take 1 capsule (500 mg total) by mouth 4 (four) times daily. 28 capsule 2   . cyclobenzaprine (FLEXERIL) 10 MG tablet Take 1 tablet (10 mg total) by mouth 3 (three) times daily as needed for muscle spasms. 30 tablet 2   . predniSONE (DELTASONE) 5 MG tablet Take 4 tablets (20 mg total) by mouth daily with breakfast. Take 72m daily for 2 weeks; then taper by 5 mg a week until done (Patient not taking: Reported on 09/30/2019) 98 tablet 0   . Prenat-FePoly-Metf-FA-DHA-DSS (VITAFOL FE+) 90-1-200 & 50 MG CPPK Take 1 tablet by mouth daily. 60 each 3   . promethazine (PHENERGAN) 12.5 MG tablet Take 1 tablet (12.5 mg total) by mouth every 6 (six) hours as needed for nausea or vomiting. (Patient not taking: Reported on 09/18/2019) 30 tablet 1   . vedolizumab (ENTYVIO) 300 MG injection Inject 300 mg into the vein every 6 (six) weeks.      . Vitamin D, Ergocalciferol, (DRISDOL) 1.25 MG (50000 UT) CAPS capsule Take 1 capsule (50,000 Units total) by mouth every 7 (seven) days. (Patient not taking: Reported on 09/25/2019) 6 capsule 0      Review of Systems   All systems reviewed and negative except as stated in HPI  Blood pressure 124/87, pulse 89, resp. rate 18, height 5' 6"  (1.676 m), weight 92.1 kg, last menstrual period 01/14/2019. General appearance: alert, cooperative and no distress Lungs: clear to auscultation bilaterally Heart: regular rate and rhythm Abdomen: soft, non-tender; bowel sounds normal Extremities: No calf swelling or tenderness Presentation: cephalic by u[place] Fetal monitoring: 140 bpm, moderate variability, + acels, no decels  Uterine activity: No contractions tracing  Dilation: 2.5 Effacement (%): 50 Station: -3 Exam by:: Jessica [name]   Prenatal labs: ABO, Rh: --/--/PENDING (10/30 03818 Antibody: PENDING (10/30 0741) Rubella: <0.90 (08/06 0955) RPR: Non Reactive (08/06 0924)   HBsAg: Negative (04/30 1028)  HIV: Non Reactive (08/06 0924)  GBS: --/[place](10/07 0253)  2 hr Glucola: Normal  Genetic screening:  Low risk female  Anatomy UEX:HBZJIR  Prenatal Transfer Tool  Maternal Diabetes: No Genetic Screening: Normal Maternal Ultrasounds/Referrals: Normal Fetal Ultrasounds or other Referrals:  None Maternal Substance Abuse:  No Significant Maternal Medications:  Meds include: Other: Prednisone, Entyvio (for Crohn's disease, not taking at this time) Significant Maternal Lab Results: Group B Strep negative  Results for orders placed or performed during the hospital encounter of 10/18/19 (from  the past 24 hour(s))  CBC   Collection Time: 10/18/19  7:41 AM  Result Value Ref Range   WBC 6.1 4.0 - 10.5 K/uL   RBC 3.62 (L) 3.87 - 5.11 MIL/uL   Hemoglobin 8.8 (L) 12.0 - 15.0 g/dL   HCT 28.6 (L) 36.0 - 46.0 %   MCV 79.0 (L) 80.0 - 100.0 fL   MCH 24.3 (L) 26.0 - 34.0 pg   MCHC 30.8 30.0 - 36.0 g/dL   RDW 13.2 11.5 - 15.5 %   Platelets 226 150 - 400 K/uL   nRBC 0.0 0.0 - 0.2 %  Type and screen   Collection Time: 10/18/19  7:41 AM  Result Value Ref Range   ABO/RH(D) PENDING    Antibody Screen PENDING    Sample Expiration      10/21/2019,2359 Performed at [facility], [place] [address] [place], [place] [address]     Patient Active Problem List   Diagnosis Date Noted  . Elevated blood pressure affecting pregnancy, antepartum 10/18/2019  . [redacted] weeks gestation of pregnancy 10/18/2019  . Chlamydia infection affecting pregnancy, antepartum 09/30/2019  . Rubella non-immune status, antepartum 04/22/2019  . History of gestational hypertension 04/18/2019  . Supervision of other normal pregnancy, antepartum 03/28/2019  . Crohn's colitis ([place]) 05/03/2016  . Nausea and vomiting 01/22/2015  . Crohn's ileitis ([place]) 01/22/2015  . Diarrhea 01/22/2015  . Malnutrition of moderate degree ([place]) 01/05/2015  . Nausea 01/03/2015  . Absolute anemia   . CC (Crohn's  colitis) ([place]) 11/02/2014  . Rectal bleeding 11/02/2014  . Maternal Crohn's disease affecting pregnancy ([place]) 11/01/2014  . Crohn's disease ([place]) 05/03/2013    Assessment: CARIANNA LAGUE is a 24 y.o. [phone] at 54w4dhere for IOL for Crohn's disease affecting third trimester pregnancy as well as elevated blood pressure during pregnancy.  #Labor: Progressing well, Foley bulb was placed on initial exam.  Patient was 2.5 cm dilated with a very stretchy cervix.  Plan on Cytotec and starting Pitocin after future exams #Pain: Epidural when requested #FWB: Cat 1, reassuring #ID:  GBS negative  #MOF: Bottle #MOC: Considering Depo or OCPs  Crohn's disease -We will restart biologic once baby is born  gHTN  VGifford Shave MD 10/18/2019, 8:34 AM  I saw and evaluated the patient. I agree with the findings and the plan of care as documented in the resident's note. Edits in blue.  EFW 3700g. Mildly elevated BP's in clinic this week. Pr/Cr 383. BP's normal thus far on admission; if elevated again, would meet criteria for Pre-E without severe features. No NSAIDs. Cytotec and Foley bulb placed. Will plan for patient to have Depo and then sign paperwork in clinic for interval BTL. Message sent to scheduling to have patient come sign BTL paperwork and then to have virtual visit for pre-op assessment and counseling.   CBarrington Ellison MD OCitrus Memorial HospitalFamily Medicine Fellow, FVa Eastern Colorado Healthcare Systemfor WDean Foods Company COtis

## 2019-10-18 NOTE — Progress Notes (Signed)
  LABOR PROGRESS NOTE   23 y.o. [id number] at 61w4dwho presents for IOL s/t Chron's. Pregnancy and medical history significant for Chron's, gHTN in previous pregnancy, and colectomy.     Subjective: Pain is controlled with epidural. Denies headaches or vision changes.   Objective: BP 117/85   Pulse 76   Temp 97.6 F (36.4 C) (Oral)   Resp 18   Ht 5' 6"  (1.676 m)   Wt 92.1 kg   LMP 01/14/2019   SpO2 98%   BMI 32.77 kg/m  or  Vitals:   10/18/19 1121 10/18/19 1122 10/18/19 1132 10/18/19 1201  BP:  129/89 111/84 117/85  Pulse:  84 70 76  Resp:  18 18   Temp:      TempSrc:      SpO2: 98%     Weight:      Height:        Dilation: 7 Effacement (%): 50 Station: -1 Presentation: Vertex Exam by:: Ceriah Kohler FHT: baseline rate  145, moderate varibility, +acel, -decel Toco: q 3 min  Labs: Lab Results  Component Value Date   WBC 6.1 10/18/2019   HGB 8.8 (L) 10/18/2019   HCT 28.6 (L) 10/18/2019   MCV 79.0 (L) 10/18/2019   PLT 226 10/18/2019    Patient Active Problem List   Diagnosis Date Noted  . Elevated blood pressure affecting pregnancy, antepartum 10/18/2019  . [redacted] weeks gestation of pregnancy 10/18/2019  . Pre-eclampsia 10/18/2019  . Chlamydia infection affecting pregnancy, antepartum 09/30/2019  . Rubella non-immune status, antepartum 04/22/2019  . History of gestational hypertension 04/18/2019  . Supervision of other normal pregnancy, antepartum 03/28/2019  . Crohn's colitis ([place] 05/03/2016  . Nausea and vomiting 01/22/2015  . Crohn's ileitis ([place] 01/22/2015  . Diarrhea 01/22/2015  . Malnutrition of moderate degree ([place] 01/05/2015  . Nausea 01/03/2015  . Absolute anemia   . CC (Crohn's colitis) ([place] 11/02/2014  . Rectal bleeding 11/02/2014  . Maternal Crohn's disease affecting pregnancy ([place]ed [place] 11/01/2014  . Crohn's disease ([place] 05/03/2013    Assessment / Plan:  23y.o. [id number] 352w4dho presents for IOL s/t Chron's. Pregnancy and medical history  significant for Chron's, gHTN in previous pregnancy, and colectomy.   Labor: Progressing to active labor from 2.5 to 7 on this exam. No augmentation at this time. Will Arom at next check.  Fetal Wellbeing:  Cat 1 Pain Control:  Epidural  Anticipated MOD:  SVD  Pre-Eclampsia: Pt has hx of gHTN. Urine protein/Cr was elevated. No severe feature at this time warranting Mag. Continue to Monitor .   Chron's Biologic therapy dc/d prior to this pregnancy. Will need to be restarted post partum.   JoMaxie BetterPGY-1, MD+ Labor and Delivery Teaching service  10/18/2019, 12:19 PM

## 2019-10-18 NOTE — Lactation Note (Signed)
This note was copied from a baby's chart. Lactation Consultation Note  Patient Name: Jessica [name] [id number] Date: 10/18/2019 Reason for consult: Initial assessment;Term Mom is P3.  Mom reports she formula fed her other babies.  Plans to formula feed this one as well.  Discussed benefits of offering EMM in bottle or breastfeeding.Mom denies need.  Urged her to call lactation as needed  Maternal Data    Feeding Feeding Type: Formula Nipple Type: Slow - flow  LATCH Score                   Interventions Interventions: Breast feeding basics reviewed  Lactation Tools Discussed/Used     Consult Status Consult Status: Complete    Ancelmo Hunt S Yaeko Fazekas 10/18/2019, 5:14 PM

## 2019-10-18 NOTE — Progress Notes (Signed)
Jessica [name] MRN: [id number]  Subjective: -Care assumed of 23 y.o. [id number] at 52w4dwho presents for IOL s/t Chron's. Pregnancy and medical history significant for Chron's, gHTN in previous pregnancy, and colectomy.  In room to meet acquaintance of patient and family.  Patient reports discomfort with contractions and states her FB fell out around 0745.  She endorses fetal movement and denies LOF or VB.  Patient does plan for epidural for pain management, but does not desire currently.  Patient expresses desire for BTL for birth control.    Objective: BP (!) 132/96   Pulse 87   Temp 97.6 F (36.4 C) (Oral)   Resp 18   Ht 5' 6"  (1.676 m)   Wt 92.1 kg   LMP 01/14/2019   BMI 32.77 kg/m  No intake/output data recorded. No intake/output data recorded.  Fetal Monitoring: FHT: 140 bpm, Mod Var, -Decels, +Accels UC: Q2-344m, palpates mild to moderate    Vaginal Exam: SVE:   Dilation: 2.5 Effacement (%): 50 Station: -3 Exam by:: Cresenzo Membranes:Intact Internal Monitors: None  Augmentation/Induction: Pitocin:None Cytotec: S/P at 0800 Foley: S/P   Assessment:  IUP at 39.4 weeks Cat I FT  IOL d/t Chron's GBS Negative H/O GHTN Desires BTL  Plan: -Reviewed blood pressures and informed of PreEclampsia labs.  Discussed diagnosis of gHTN if labs return normal range. -Encouraged to get epidural once unable to cope with pain. -Chart reviewed and patient without BTL paperwork.  Will inform of need to sign papers in office after delivery for 6wk PPBTL. -Continue other mgmt as ordered.  JeRiley ChurchesCNM Advanced Practice Provider, Center for Wo[place]0/30/2020, 10:35 AM

## 2019-10-18 NOTE — Anesthesia Preprocedure Evaluation (Addendum)
Anesthesia Evaluation  Patient identified by MRN, date of birth, ID band Patient awake    Reviewed: Allergy & Precautions, NPO status , Patient's Chart, lab work & pertinent test results  Airway Mallampati: II  TM Distance: >3 FB Neck ROM: Full    Dental no notable dental hx. (+) Teeth Intact   Pulmonary neg pulmonary ROS,    Pulmonary exam normal breath sounds clear to auscultation       Cardiovascular hypertension, Normal cardiovascular exam Rhythm:Regular Rate:Normal  Gestational HTN   Neuro/Psych negative neurological ROS  negative psych ROS   GI/Hepatic Crohns Dz s/P colectomy   Endo/Other  negative endocrine ROS  Renal/GU negative Renal ROS     Musculoskeletal negative musculoskeletal ROS (+)   Abdominal   Peds  Hematology  (+) anemia , Hgb 8.8 Plt 226   Anesthesia Other Findings   Reproductive/Obstetrics (+) Pregnancy                           Anesthesia Physical Anesthesia Plan  ASA: III  Anesthesia Plan: Epidural   Post-op Pain Management:    Induction:   PONV Risk Score and Plan:   Airway Management Planned:   Additional Equipment:   Intra-op Plan:   Post-operative Plan:   Informed Consent: I have reviewed the patients History and Physical, chart, labs and discussed the procedure including the risks, benefits and alternatives for the proposed anesthesia with the patient or authorized representative who has indicated his/her understanding and acceptance.       Plan Discussed with:   Anesthesia Plan Comments: (39.2 wk G4P2  For LEA)      Anesthesia Quick Evaluation

## 2019-10-18 NOTE — Anesthesia Procedure Notes (Signed)
Epidural Patient location during procedure: OB Start time: 10/18/2019 10:43 AM End time: 10/18/2019 10:56 AM  Staffing Anesthesiologist: Barnet Glasgow, MD Performed: anesthesiologist   Preanesthetic Checklist Completed: patient identified, site marked, surgical consent, pre-op evaluation, timeout performed, IV checked, risks and benefits discussed and monitors and equipment checked  Epidural Patient position: sitting Prep: site prepped and draped and DuraPrep Patient monitoring: continuous pulse ox and blood pressure Approach: midline Location: L3-L4 Injection technique: LOR air  Needle:  Needle type: Tuohy  Needle gauge: 17 G Needle length: 9 cm and 9 Needle insertion depth: 8 cm Catheter type: closed end flexible Catheter size: 19 Gauge Catheter at skin depth: 13 cm Test dose: negative  Assessment Events: blood not aspirated, injection not painful, no injection resistance, negative IV test and no paresthesia  Additional Notes Patient identified. Risks/Benefits/Options discussed with patient including but not limited to bleeding, infection, nerve damage, paralysis, failed block, incomplete pain control, headache, blood pressure changes, nausea, vomiting, reactions to medication both or allergic, itching and postpartum back pain. Confirmed with bedside nurse the patient's most recent platelet count. Confirmed with patient that they are not currently taking any anticoagulation, have any bleeding history or any family history of bleeding disorders. Patient expressed understanding and wished to proceed. All questions were answered. Sterile technique was used throughout the entire procedure. Please see nursing notes for vital signs. Test dose was given through epidural needle and negative prior to continuing to dose epidural or start infusion. Warning signs of high block given to the patient including shortness of breath, tingling/numbness in hands, complete motor block, or any  concerning symptoms with instructions to call for help. Patient was given instructions on fall risk and not to get out of bed. All questions and concerns addressed with instructions to call with any issues.1  Attempt (S) . Patient tolerated procedure well.

## 2019-10-19 LAB — COMPREHENSIVE METABOLIC PANEL
ALT: 10 U/L (ref 0–44)
AST: 14 U/L — ABNORMAL LOW (ref 15–41)
Albumin: 2.3 g/dL — ABNORMAL LOW (ref 3.5–5.0)
Alkaline Phosphatase: 102 U/L (ref 38–126)
Anion gap: 8 (ref 5–15)
BUN: 6 mg/dL (ref 6–20)
CO2: 23 mmol/L (ref 22–32)
Calcium: 9 mg/dL (ref 8.9–10.3)
Chloride: 106 mmol/L (ref 98–111)
Creatinine, Ser: 0.55 mg/dL (ref 0.44–1.00)
GFR calc Af Amer: >60 mL/min (ref >60)
GFR calc non Af Amer: >60 mL/min (ref >60)
Glucose, Bld: 82 mg/dL (ref 70–99)
Potassium: 3.9 mmol/L (ref 3.5–5.1)
Sodium: 137 mmol/L (ref 135–145)
Total Bilirubin: 0.3 mg/dL (ref 0.3–1.2)
Total Protein: 6 g/dL — ABNORMAL LOW (ref 6.5–8.1)

## 2019-10-19 MED ORDER — ENALAPRIL MALEATE 5 MG PO TABS
5.0000 mg | ORAL_TABLET | Freq: Every day | ORAL | Status: DC
  Administered 2019-10-19: 5 mg via ORAL
  Filled 2019-10-19: qty 1

## 2019-10-19 MED ORDER — MEASLES, MUMPS & RUBELLA VAC IJ SOLR: 0.5000 mL | Freq: Once | INTRAMUSCULAR | Status: DC

## 2019-10-19 NOTE — Progress Notes (Signed)
POSTPARTUM PROGRESS NOTE  Subjective: ESTHELA BRANDNER is a 23 y.o. [phone] s/p vaginal delivery at [redacted]w[redacted]d  She reports she doing well. No acute events overnight. She denies any problems with ambulating, voiding or po intake. Denies nausea or vomiting. She has passed flatus. Pain is moderately controlled.  Lochia is appropriate.  Objective: Blood pressure (!) 129/100, pulse 74, temperature 97.7 F (36.5 C), temperature source Oral, resp. rate 18, height 5' 6"  (1.676 m), weight 92.1 kg, last menstrual period 01/14/2019, SpO2 100 %, unknown if currently breastfeeding.  Physical Exam:  General: alert, cooperative and no distress Chest: no respiratory distress Abdomen: soft, non-tender  Uterine Fundus: firm, appropriately tender Extremities: No calf swelling or tenderness  No edema  Recent Labs    10/18/19 0741 10/18/19 1448  HGB 8.8* 8.4*  HCT 28.6* 27.2*    Assessment/Plan: ZSHAYLAH MCGHIEis a 23y.o. [id number]/p induced vaginal delivery at 358w4dor Crohn's flare and pre-E w/o sf.  Routine Postpartum Care: Doing well, pain well-controlled.  -- Continue routine care, lactation support  -- Contraception: depo>interval BTL -- Feeding: breast  Dispo: Plan for discharge tomorrow.  HAMerilyn BabaDO OB/GYN Fellow, FaC[facility]

## 2019-10-19 NOTE — Plan of Care (Signed)
  Problem: Education: Goal: Knowledge of General Education information will improve Description: Including pain rating scale, medication(s)/side effects and non-pharmacologic comfort measures Outcome: Progressing   Problem: Health Behavior/Discharge Planning: Goal: Ability to manage health-related needs will improve Outcome: Progressing   Problem: Clinical Measurements: Goal: Ability to maintain clinical measurements within normal limits will improve Outcome: Progressing Goal: Will remain free from infection Outcome: Progressing Goal: Diagnostic test results will improve Outcome: Progressing Goal: Respiratory complications will improve Outcome: Progressing Goal: Cardiovascular complication will be avoided Outcome: Progressing   Problem: Activity: Goal: Risk for activity intolerance will decrease Outcome: Progressing   Problem: Nutrition: Goal: Adequate nutrition will be maintained Outcome: Progressing   Problem: Coping: Goal: Level of anxiety will decrease Outcome: Progressing   Problem: Elimination: Goal: Will not experience complications related to bowel motility Outcome: Progressing Goal: Will not experience complications related to urinary retention Outcome: Progressing   Problem: Pain Managment: Goal: General experience of comfort will improve Outcome: Progressing   Problem: Safety: Goal: Ability to remain free from injury will improve Outcome: Progressing   Problem: Skin Integrity: Goal: Risk for impaired skin integrity will decrease Outcome: Progressing   Problem: Activity: Goal: Ability to tolerate increased activity will improve Outcome: Progressing   Problem: Coping: Goal: Ability to identify and utilize available resources and services will improve Outcome: Progressing   Problem: Life Cycle: Goal: Chance of risk for complications during the postpartum period will decrease Outcome: Progressing   Problem: Role Relationship: Goal: Ability to  demonstrate positive interaction with newborn will improve Outcome: Progressing   Problem: Skin Integrity: Goal: Demonstration of wound healing without infection will improve Outcome: Progressing

## 2019-10-19 NOTE — Anesthesia Postprocedure Evaluation (Signed)
Anesthesia Post Note  Patient: Jessica [name]  Procedure(s) Performed: AN AD HOC LABOR EPIDURAL     Patient location during evaluation: Mother Baby Anesthesia Type: Epidural Level of consciousness: awake Pain management: satisfactory to patient Vital Signs Assessment: post-procedure vital signs reviewed and stable Respiratory status: spontaneous breathing Cardiovascular status: stable Anesthetic complications: no    Last Vitals:  Vitals:   10/19/19 0048 10/19/19 0500  BP: 119/86 (!) 129/100  Pulse: 73 74  Resp: 18 18  Temp: 36.6 C 36.5 C  SpO2: 100%     Last Pain:  Vitals:   10/19/19 0746  TempSrc:   PainSc: 6    Pain Goal:                Epidural/Spinal Function Cutaneous sensation: Normal sensation (10/19/19 0746)  Casimer Lanius

## 2019-10-20 MED ORDER — ENALAPRIL MALEATE 5 MG PO TABS
10.0000 mg | ORAL_TABLET | Freq: Every day | ORAL | Status: DC
  Administered 2019-10-20: 10 mg via ORAL
  Filled 2019-10-20: qty 2

## 2019-10-20 MED ORDER — ENALAPRIL MALEATE 5 MG PO TABS
5.0000 mg | ORAL_TABLET | Freq: Once | ORAL | Status: AC
  Administered 2019-10-20: 5 mg via ORAL
  Filled 2019-10-20: qty 1

## 2019-10-20 MED ORDER — OXYCODONE HCL 10 MG PO TABS: 5.0000 mg | ORAL_TABLET | ORAL | 0 refills | Status: DC | PRN

## 2019-10-20 MED ORDER — OXYCODONE HCL 5 MG PO TABS
10.0000 mg | ORAL_TABLET | ORAL | Status: DC | PRN
  Administered 2019-10-20 (×3): 10 mg via ORAL
  Filled 2019-10-20 (×3): qty 2

## 2019-10-20 MED ORDER — ENALAPRIL MALEATE 10 MG PO TABS: 10.0000 mg | ORAL_TABLET | Freq: Every day | ORAL | 0 refills | Status: DC

## 2019-10-23 ENCOUNTER — Encounter: Payer: Medicaid Other | Admitting: Women's Health

## 2019-11-18 ENCOUNTER — Ambulatory Visit: Payer: Medicaid Other | Admitting: Obstetrics and Gynecology

## 2019-12-10 ENCOUNTER — Ambulatory Visit: Payer: Medicaid Other | Admitting: Obstetrics and Gynecology

## 2019-12-16 ENCOUNTER — Telehealth: Payer: Self-pay | Admitting: *Deleted

## 2019-12-16 NOTE — Telephone Encounter (Signed)
Informed patient to call the office when she is ready to make an appointment to come in. She will need to be seen.

## 2019-12-24 ENCOUNTER — Encounter: Payer: Self-pay | Admitting: Obstetrics & Gynecology

## 2019-12-24 ENCOUNTER — Ambulatory Visit (INDEPENDENT_AMBULATORY_CARE_PROVIDER_SITE_OTHER): Payer: Medicaid Other | Admitting: Obstetrics & Gynecology

## 2019-12-24 ENCOUNTER — Other Ambulatory Visit: Payer: Self-pay

## 2019-12-24 DIAGNOSIS — Z1389 Encounter for screening for other disorder: Secondary | ICD-10-CM | POA: Diagnosis not present

## 2019-12-24 NOTE — Patient Instructions (Signed)
Hormonal Contraception Information Hormonal contraception is a type of birth control that uses hormones to prevent pregnancy. It usually involves a combination of the hormones estrogen and progesterone or only the hormone progesterone. Hormonal contraception works in these ways:  It thickens the mucus in the cervix, making it harder for sperm to enter the uterus.  It changes the lining of the uterus, making it harder for an egg to implant.  It may stop the ovaries from releasing eggs (ovulation). Some women who take hormonal contraceptives that contain only progesterone may continue to ovulate. Hormonal contraception cannot prevent sexually transmitted infections (STIs). Pregnancy may still occur. Estrogen and progesterone contraceptives Contraceptives that use a combination of estrogen and progesterone are available in these forms:  Pill. Pills come in different combinations of hormones. They must be taken at the same time each day. Pills can affect your period, causing you to get your period once every three months or not at all.  Patch. The patch must be worn on the lower abdomen for three weeks and then removed on the fourth.  Vaginal ring. The ring is placed in the vagina and left there for three weeks. It is then removed for one week. Progesterone contraceptives Contraceptives that use progesterone only are available in these forms:  Pill. Pills should be taken every day of the cycle.  Intrauterine device (IUD). This device is inserted into the uterus and removed or replaced every five years or sooner.  Implant. Plastic rods are placed under the skin of the upper arm. They are removed or replaced every three years or sooner.  Injection. The injection is given once every 90 days. What are the side effects? The side effects of estrogen and progesterone contraceptives include:  Nausea.  Headaches.  Breast tenderness.  Bleeding or spotting between menstrual cycles.  High blood  pressure (rare).  Strokes, heart attacks, or blood clots (rare) Side effects of progesterone-only contraceptives include:  Nausea.  Headaches.  Breast tenderness.  Unpredictable menstrual bleeding.  High blood pressure (rare). Talk to your health care provider about what side effects may affect you. Where to find more information  Ask your health care provider for more information and resources about hormonal contraception.  U.[place] Department of Health and Programmer, systems on Women's Health: [place] Questions to ask:  What type of hormonal contraception is right for me?  How long should I plan to use hormonal contraception?  What are the side effects of the hormonal contraception method I choose?  How can I prevent STIs while using hormonal contraception? Contact a health care provider if:  You start taking hormonal contraceptives and you develop persistent or severe side effects. Summary  Estrogen and progesterone are hormones used in many forms of birth control.  Talk to your health care provider about what side effects may affect you.  Hormonal contraception cannot prevent sexually transmitted infections (STIs).  Ask your health care provider for more information and resources about hormonal contraception. This information is not intended to replace advice given to you by your health care provider. Make sure you discuss any questions you have with your health care provider. Document Revised: 04/01/2019 Document Reviewed: 11/04/2016 Elsevier Patient Education  [place].

## 2019-12-24 NOTE — Progress Notes (Signed)
Subjective:     Jessica [name] is a 24 y.o. female who presents for a postpartum visit. She is 9 weeks postpartum following a spontaneous vaginal delivery. I have fully reviewed the prenatal and intrapartum course. The delivery was at 39.4 gestational weeks. Outcome: spontaneous vaginal delivery. Anesthesia: epidural. Postpartum course has been Unremarkable. Baby's course has been Unremarkable. Baby is feeding by bottle Dory Horn. Bleeding no bleeding. Bowel function is normal. Bladder function is normal. Patient is not sexually active. Contraception method is none. Postpartum depression screening: negative.  The following portions of the patient's history were reviewed and updated as appropriate: allergies, current medications, past family history, past medical history, past social history, past surgical history and problem list.  Review of Systems Pertinent items are noted in HPI.   Objective:    BP 111/79   Pulse 79   Ht 5' 6"  (1.676 m)   Wt 189 lb (85.7 kg)   LMP 12/09/2019 (Exact Date)   Breastfeeding No   BMI 30.51 kg/m   General:  alert, cooperative, appears stated age and no distress            Abdomen: soft, non-tender; bowel sounds normal; no masses,  no organomegaly   Vulva:  not evaluated  Vagina: not evaluated   Assessment:     normal postpartum exam. Pap smear not done at today's visit.   Plan:    1. Contraception: undecided 2. Contraception counseling 3. Follow up in as needed.    Woodroe Mode, MD

## 2020-01-08 ENCOUNTER — Ambulatory Visit (INDEPENDENT_AMBULATORY_CARE_PROVIDER_SITE_OTHER): Payer: Medicaid Other

## 2020-01-08 ENCOUNTER — Other Ambulatory Visit: Payer: Self-pay

## 2020-01-08 DIAGNOSIS — Z3042 Encounter for surveillance of injectable contraceptive: Secondary | ICD-10-CM | POA: Diagnosis not present

## 2020-01-08 MED ORDER — MEDROXYPROGESTERONE ACETATE 150 MG/ML IM SUSP: 150.0000 mg | INTRAMUSCULAR | 0 refills | Status: DC

## 2020-01-08 MED ORDER — MEDROXYPROGESTERONE ACETATE 150 MG/ML IM SUSP
150.0000 mg | Freq: Once | INTRAMUSCULAR | Status: AC
  Administered 2020-01-08: 150 mg via INTRAMUSCULAR

## 2020-01-08 NOTE — Progress Notes (Signed)
Pt is here for depo. She received her first depo after delivery. Pt tolerated injection well in the LD without complications. Pt advised to make next appointment around April 4-21. -EH/RMA

## 2020-01-09 ENCOUNTER — Telehealth: Payer: Self-pay | Admitting: Gastroenterology

## 2020-01-09 NOTE — Telephone Encounter (Signed)
Jessica [name] can you touch base with this patient and see how she is doing? She should have resumed her Entyvio after she delivered her child, can you help coordinate this for her if she has not had it yet. Also needs an office visit but do not want to delay the start of her Entyvio. Thanks

## 2020-01-13 NOTE — Telephone Encounter (Signed)
Called and left message for patient to please call back. Trying to get her Entyvio restarted, since it doesn't need a new authorization.

## 2020-01-13 NOTE — Telephone Encounter (Signed)
There is no auth needed for Medicaid patients.

## 2020-01-15 ENCOUNTER — Telehealth: Payer: Self-pay

## 2020-01-15 ENCOUNTER — Other Ambulatory Visit: Payer: Self-pay

## 2020-01-15 DIAGNOSIS — K50819 Crohn's disease of both small and large intestine with unspecified complications: Secondary | ICD-10-CM

## 2020-01-15 NOTE — Telephone Encounter (Signed)
Jessica [name] can you clarify how long she has been off of it? If more than 6 months then she should reload. If it has only been a few months then resume normal schedule. She should not have been off it this long, I hope she has not lost response. She should have a follow up appointment in the clinic with me, can you set one up if she is not currently scheduled? Thanks

## 2020-01-15 NOTE — Telephone Encounter (Signed)
See phone note

## 2020-01-15 NOTE — Telephone Encounter (Signed)
I have left messages on patient's voice mail and sent My Chart message to please call Jessica [name] so we can get her started back on her [facility]

## 2020-01-15 NOTE — Telephone Encounter (Signed)
Does this patient need to start over on her Entyvio Infusions at: 0 weeks, 2 weeks, 6 weeks, and then 8 weeks maintance ?  Or does she go straight back to every 8 weeks infusions ?  Please advise

## 2020-01-15 NOTE — Telephone Encounter (Signed)
Patient's last Entyvio infusion was 01/2019. Scheduled 1st loading dose infusion on 01/22/20 at 9:00am at [place]. Patient aware of appt.

## 2020-01-15 NOTE — Telephone Encounter (Signed)
Great, thanks [employer]

## 2020-01-22 ENCOUNTER — Inpatient Hospital Stay (HOSPITAL_COMMUNITY): Admission: RE | Admit: 2020-01-22 | Payer: Medicaid Other | Source: Ambulatory Visit

## 2020-01-27 ENCOUNTER — Encounter (HOSPITAL_COMMUNITY)
Admission: RE | Admit: 2020-01-27 | Discharge: 2020-01-27 | Disposition: A | Discharge status: Home / Self Care | Payer: Medicaid Other | Source: Ambulatory Visit | Attending: Gastroenterology | Admitting: Gastroenterology

## 2020-01-27 ENCOUNTER — Other Ambulatory Visit: Payer: Self-pay

## 2020-01-27 DIAGNOSIS — K50819 Crohn's disease of both small and large intestine with unspecified complications: Secondary | ICD-10-CM | POA: Diagnosis not present

## 2020-01-27 MED ORDER — VEDOLIZUMAB 300 MG IV SOLR
300.0000 mg | Freq: Once | INTRAVENOUS | Status: AC
  Administered 2020-01-27: 300 mg via INTRAVENOUS
  Filled 2020-01-27: qty 5

## 2020-02-10 ENCOUNTER — Encounter: Payer: Self-pay | Admitting: Certified Nurse Midwife

## 2020-02-10 ENCOUNTER — Inpatient Hospital Stay (HOSPITAL_COMMUNITY): Admission: RE | Admit: 2020-02-10 | Payer: Medicaid Other | Source: Ambulatory Visit

## 2020-02-12 ENCOUNTER — Other Ambulatory Visit: Payer: Self-pay

## 2020-02-12 ENCOUNTER — Encounter (HOSPITAL_BASED_OUTPATIENT_CLINIC_OR_DEPARTMENT_OTHER): Payer: Self-pay | Admitting: Obstetrics & Gynecology

## 2020-02-13 ENCOUNTER — Ambulatory Visit: Payer: Medicaid Other | Admitting: Gastroenterology

## 2020-02-13 ENCOUNTER — Encounter: Payer: Self-pay | Admitting: Gastroenterology

## 2020-02-13 ENCOUNTER — Other Ambulatory Visit (INDEPENDENT_AMBULATORY_CARE_PROVIDER_SITE_OTHER): Payer: Medicaid Other

## 2020-02-13 ENCOUNTER — Other Ambulatory Visit: Payer: Self-pay | Admitting: Obstetrics & Gynecology

## 2020-02-13 VITALS — BP 142/76 | HR 84 | Temp 98.5°F | Ht 66.0 in | Wt 196.0 lb

## 2020-02-13 DIAGNOSIS — K50019 Crohn's disease of small intestine with unspecified complications: Secondary | ICD-10-CM | POA: Diagnosis not present

## 2020-02-13 LAB — CBC WITH DIFFERENTIAL/PLATELET
Basophils Absolute: 0 10*3/uL (ref 0.0–0.1)
Basophils Relative: 0.5 % (ref 0.0–3.0)
Eosinophils Absolute: 0.1 10*3/uL (ref 0.0–0.7)
Eosinophils Relative: 0.9 % (ref 0.0–5.0)
HCT: 36.2 % (ref 36.0–46.0)
Hemoglobin: 11.5 g/dL — ABNORMAL LOW (ref 12.0–15.0)
Lymphocytes Relative: 30.8 % (ref 12.0–46.0)
Lymphs Abs: 2.6 10*3/uL (ref 0.7–4.0)
MCHC: 31.6 g/dL (ref 30.0–36.0)
MCV: 77.6 fl — ABNORMAL LOW (ref 78.0–100.0)
Monocytes Absolute: 0.6 10*3/uL (ref 0.1–1.0)
Monocytes Relative: 7.3 % (ref 3.0–12.0)
Neutro Abs: 5.1 10*3/uL (ref 1.4–7.7)
Neutrophils Relative %: 60.5 % (ref 43.0–77.0)
Platelets: 322 10*3/uL (ref 150.0–400.0)
RBC: 4.67 Mil/uL (ref 3.87–5.11)
RDW: 15.7 % — ABNORMAL HIGH (ref 11.5–15.5)
WBC: 8.4 10*3/uL (ref 4.0–10.5)

## 2020-02-13 LAB — COMPREHENSIVE METABOLIC PANEL
ALT: 16 U/L (ref 0–35)
AST: 19 U/L (ref 0–37)
Albumin: 4.3 g/dL (ref 3.5–5.2)
Alkaline Phosphatase: 65 U/L (ref 39–117)
BUN: 13 mg/dL (ref 6–23)
CO2: 22 mEq/L (ref 19–32)
Calcium: 9.6 mg/dL (ref 8.4–10.5)
Chloride: 106 mEq/L (ref 96–112)
Creatinine, Ser: 0.76 mg/dL (ref 0.40–1.20)
GFR: 111.92 mL/min (ref >60.00)
Glucose, Bld: 90 mg/dL (ref 70–99)
Potassium: 4 mEq/L (ref 3.5–5.1)
Sodium: 137 mEq/L (ref 135–145)
Total Bilirubin: 0.3 mg/dL (ref 0.2–1.2)
Total Protein: 8.3 g/dL (ref 6.0–8.3)

## 2020-02-13 LAB — VITAMIN D 25 HYDROXY (VIT D DEFICIENCY, FRACTURES): VITD: 11.3 ng/mL — ABNORMAL LOW (ref 30.00–100.00)

## 2020-02-13 NOTE — Patient Instructions (Signed)
If you are age 26 or older, your body mass index should be between 23-30. Your Body mass index is 31.64 kg/m. If this is out of the aforementioned range listed, please consider follow up with your Primary Care Provider.  If you are age 66 or younger, your body mass index should be between 19-25. Your Body mass index is 31.64 kg/m. If this is out of the aformentioned range listed, please consider follow up with your Primary Care Provider.   Please go to the lab in the basement of our building to have lab work done as you leave today. Hit "B" for basement when you get on the elevator.  When the doors open the lab is on your left.  We will call you with the results. Thank you.  Continue Entyvio.  Please follow up in 6 months.  Thank you for entrusting me with your care and for choosing [facility], Dr. Chrisman Cellar

## 2020-02-13 NOTE — Progress Notes (Signed)
HPI :  Crohn's history History of Crohns diagnosed in 2012. 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 She was hospitalized for diversion colitis in 2015. Ileostomy takedown was done in 2016. She had a post-operative colonoscopy in February 2016 showing mildly active disease, and was placed on Humira in March 2016.She developed antibody on this regimen, Humira was increased and started on 6MP. The antibody level went away but Humira level remained low despite optimal dosing. Started on [facility] November 2018. Follow up colonoscopy in 2019 looked normal.   SINCE LAST VISIT:  Patient here for a follow-up visit for her Crohn's disease.  At the last time I saw her she was [redacted] weeks pregnant.  She continued her Entyvio throughout although we held the last dose 6 to 10 weeks prior to her pregnancy.  During this time she had a mild flare of her symptoms that led to use of prednisone.  This worked quite well to treat her flare.  Post partum she felt well, has had no issues with her Crohn's disease.  I have not seen her in a while.  She delivered about 4 months ago.  We were able to get her restarted on Entyvio about 2 weeks ago she had a dose.  She tolerated it well.  She felt quite well on the [facility] previously when she was on it before.  We have reloaded her with dosing given she was off it for several months.  She is having a tubal ligation next week and asks if she should hold her next dose  She has no abdominal pains of bother her.  Her bowels are regular.  She has no blood in her stools.  Eating well, no nausea or vomiting.  Generally quite happy with the regimen and feeling well.  She is due for labs as outlined below.  She previously has had vitamin D deficiency, not on any supplementation at this time.    Prior endoscopic  evaluation: Colonoscopy 08/14/2018 - normal ileum, normal anastomosis in the ascending colon, no inflammation Colonoscopy 2/16 - patent anastomosis with some inflammatory changes, ileum with some ulcerations EGD 2/16 - normal  Healthcare maintenance: Shehaspreviouslydeclined flu shot/ pneumovax Vitamin D level of 16.5 on 04/18/19 TB testing negative 2/19   Past Medical History:  Diagnosis Date  . Anemia   . Crohn's disease ([place])   . Osteoporosis   . Supervision of normal first pregnancy 05/03/2013  . Vitamin D deficiency      Past Surgical History:  Procedure Laterality Date  . COLONOSCOPY N/A 01/22/2015   Procedure: COLONOSCOPY;  Surgeon: [name], MD;  Location: [facility];  Service: Endoscopy;  Laterality: N/A;  . DILATION AND EVACUATION N/A 10/31/2014   Procedure: DILATATION AND EVACUATION;  Surgeon: Lahoma Crocker, MD;  Location: [facility];  Service: Gynecology;  Laterality: N/A;  . ESOPHAGOGASTRODUODENOSCOPY N/A 01/22/2015   Procedure: ESOPHAGOGASTRODUODENOSCOPY (EGD);  Surgeon: [name], MD;  Location: [facility];  Service: Endoscopy;  Laterality: N/A;  . FLEXIBLE SIGMOIDOSCOPY N/A 11/04/2014   Procedure: FLEXIBLE SIGMOIDOSCOPY;  Surgeon: [name], MD;  Location: [facility];  Service: Endoscopy;  Laterality: N/A;  . ILEOSTOMY CLOSURE N/A 12/18/2014   Procedure: LOOP ILEOSTOMY REVERSAL;  Surgeon: [name], MD;  Location: [facility];  Service: General;  Laterality: N/A;  . INCISION AND DRAINAGE Left  arm  . SMALL INTESTINE SURGERY    . WISDOM TOOTH EXTRACTION     Family History  Problem Relation Age of Onset  . Diabetes Father   . Diabetes Maternal Grandmother   . Heart disease Maternal Grandmother   . Hypertension Maternal Grandmother   . Kidney disease Maternal Grandmother   . Lung cancer Maternal Grandfather   . Hyperlipidemia Maternal Grandfather   . Stroke Neg Hx   . Colon cancer Neg Hx    Social History   Tobacco Use  . Smoking  status: Never Smoker  . Smokeless tobacco: Never Used  . Tobacco comment: occasional cigarette  Substance Use Topics  . Alcohol use: No    Alcohol/week: 0.0 standard drinks  . Drug use: No   Current Outpatient Medications  Medication Sig Dispense Refill  . acetaminophen (TYLENOL) 325 MG tablet Take 650 mg by mouth every 6 (six) hours as needed.    . medroxyPROGESTERone (DEPO-PROVERA) 150 MG/ML injection Inject 1 mL (150 mg total) into the muscle every 3 (three) months. 1 mL 0  . Vedolizumab (ENTYVIO IV) Inject 1 Dose into the vein every 8 (eight) weeks. infusion     No current facility-administered medications for this visit.   No Known Allergies   Review of Systems: All systems reviewed and negative except where noted in HPI.    No results found.  Physical Exam: BP (!) 142/76   Pulse 84   Temp 98.5 F (36.9 C)   Ht 5' 6"  (1.676 m)   Wt 196 lb (88.9 kg)   LMP  (LMP Unknown)   BMI 31.64 kg/m  Constitutional: Pleasant,well-developed, female in no acute distress. HEENT: Normocephalic and atraumatic. Conjunctivae are normal. No scleral icterus. Neck supple.  Cardiovascular: Normal rate, regular rhythm.  Pulmonary/chest: Effort normal and breath sounds normal. No wheezing, rales or rhonchi. Abdominal: Soft, nondistended, nontender. . There are no masses palpable. No hepatomegaly. Extremities: no edema Lymphadenopathy: No cervical adenopathy noted. Neurological: Alert and oriented to person place and time. Skin: Skin is warm and dry. No rashes noted. Psychiatric: Normal mood and affect. Behavior is normal.   ASSESSMENT AND PLAN: 24 y/o female here for reassessment of the following issues:  Crohn's disease  - history of perforating ileocolonic Crohn's, previously developed antibodies and failed dose optimization of Humira, on Entyvio since 2018 and doing really well.  She was pregnant in 2020, she continued Entyvio throughout most of her pregnancy and held it several  weeks prior to delivery.  During this time she had a mild flare treated with prednisone.  Postpartum has been doing quite well.  Unfortunately its been sometime since we have last seen her and she just resumed her Entyvio 2 weeks ago.  She is tolerating it well.  Overall doing well without any concerning symptoms.  We discussed her Crohn's disease, recommend continuing dosing the Entyvio per loading protocol.  Given she just had a dose 2 weeks ago, I do not see any reason to hold her next dose next week as this will not increase her operative risks of tubal ligation, there is no significant infection risk with this.  She will continue with Entyvio.  She is due for basic labs to include CBC, c-Met, vitamin D and QuantiFERON gold.  I suspect she will need some vitamin D supplementation but will await the results.  Given she is just recently resumed her [facility], will wait another 6 months or so and plan on checking a fecal calprotectin at her next  visit to ensure okay.  She agreed with the plan, we will continue to see her every 6 months.  All questions answered  Clairton Cellar, MD [facility]

## 2020-02-13 NOTE — Progress Notes (Signed)
Orders for LBTL 02/19/20

## 2020-02-14 ENCOUNTER — Telehealth: Payer: Self-pay

## 2020-02-14 ENCOUNTER — Other Ambulatory Visit: Payer: Self-pay

## 2020-02-14 DIAGNOSIS — D649 Anemia, unspecified: Secondary | ICD-10-CM

## 2020-02-14 NOTE — Telephone Encounter (Signed)
Left message for patient to please call back

## 2020-02-15 ENCOUNTER — Other Ambulatory Visit (HOSPITAL_COMMUNITY)
Admission: RE | Admit: 2020-02-15 | Discharge: 2020-02-15 | Disposition: A | Discharge status: Home / Self Care | Payer: Medicaid Other | Source: Ambulatory Visit | Attending: Obstetrics & Gynecology | Admitting: Obstetrics & Gynecology

## 2020-02-15 DIAGNOSIS — Z20822 Contact with and (suspected) exposure to covid-19: Secondary | ICD-10-CM | POA: Diagnosis not present

## 2020-02-15 DIAGNOSIS — Z01812 Encounter for preprocedural laboratory examination: Secondary | ICD-10-CM | POA: Insufficient documentation

## 2020-02-15 LAB — QUANTIFERON-TB GOLD PLUS
Mitogen-NIL: >10 IU/mL
NIL: 0.02 IU/mL
QuantiFERON-TB Gold Plus: NEGATIVE
TB1-NIL: 0.01 IU/mL
TB2-NIL: 0 IU/mL

## 2020-02-15 LAB — SARS CORONAVIRUS 2 (TAT 6-24 HRS): SARS Coronavirus 2: NEGATIVE

## 2020-02-18 ENCOUNTER — Inpatient Hospital Stay (HOSPITAL_COMMUNITY): Admission: RE | Admit: 2020-02-18 | Payer: Medicaid Other | Source: Ambulatory Visit

## 2020-02-19 ENCOUNTER — Ambulatory Visit (HOSPITAL_BASED_OUTPATIENT_CLINIC_OR_DEPARTMENT_OTHER)
Admission: RE | Admit: 2020-02-19 | Discharge: 2020-02-19 | Disposition: A | Discharge status: Home / Self Care | Payer: Medicaid Other | Attending: Obstetrics & Gynecology | Admitting: Obstetrics & Gynecology

## 2020-02-19 ENCOUNTER — Encounter (HOSPITAL_BASED_OUTPATIENT_CLINIC_OR_DEPARTMENT_OTHER): Payer: Self-pay | Admitting: Obstetrics & Gynecology

## 2020-02-19 ENCOUNTER — Ambulatory Visit (HOSPITAL_BASED_OUTPATIENT_CLINIC_OR_DEPARTMENT_OTHER): Payer: Medicaid Other | Admitting: Certified Registered Nurse Anesthetist

## 2020-02-19 ENCOUNTER — Encounter (HOSPITAL_BASED_OUTPATIENT_CLINIC_OR_DEPARTMENT_OTHER)
Admission: RE | Disposition: A | Discharge status: Home / Self Care | Payer: Self-pay | Source: Home / Self Care | Attending: Obstetrics & Gynecology

## 2020-02-19 ENCOUNTER — Other Ambulatory Visit: Payer: Self-pay

## 2020-02-19 DIAGNOSIS — Z841 Family history of disorders of kidney and ureter: Secondary | ICD-10-CM | POA: Insufficient documentation

## 2020-02-19 DIAGNOSIS — Z8349 Family history of other endocrine, nutritional and metabolic diseases: Secondary | ICD-10-CM | POA: Diagnosis not present

## 2020-02-19 DIAGNOSIS — Z833 Family history of diabetes mellitus: Secondary | ICD-10-CM | POA: Insufficient documentation

## 2020-02-19 DIAGNOSIS — Z793 Long term (current) use of hormonal contraceptives: Secondary | ICD-10-CM | POA: Diagnosis not present

## 2020-02-19 DIAGNOSIS — E559 Vitamin D deficiency, unspecified: Secondary | ICD-10-CM | POA: Insufficient documentation

## 2020-02-19 DIAGNOSIS — M810 Age-related osteoporosis without current pathological fracture: Secondary | ICD-10-CM | POA: Insufficient documentation

## 2020-02-19 DIAGNOSIS — K5090 Crohn's disease, unspecified, without complications: Secondary | ICD-10-CM | POA: Diagnosis not present

## 2020-02-19 DIAGNOSIS — Z8249 Family history of ischemic heart disease and other diseases of the circulatory system: Secondary | ICD-10-CM | POA: Insufficient documentation

## 2020-02-19 DIAGNOSIS — Z302 Encounter for sterilization: Secondary | ICD-10-CM

## 2020-02-19 DIAGNOSIS — Z801 Family history of malignant neoplasm of trachea, bronchus and lung: Secondary | ICD-10-CM | POA: Diagnosis not present

## 2020-02-19 DIAGNOSIS — D649 Anemia, unspecified: Secondary | ICD-10-CM | POA: Insufficient documentation

## 2020-02-19 HISTORY — PX: LAPAROSCOPIC TUBAL LIGATION: SHX1937

## 2020-02-19 LAB — POCT PREGNANCY, URINE: Preg Test, Ur: NEGATIVE

## 2020-02-19 SURGERY — LIGATION, FALLOPIAN TUBE, LAPAROSCOPIC
Anesthesia: General | Site: Vagina | Laterality: Bilateral

## 2020-02-19 MED ORDER — ACETAMINOPHEN 10 MG/ML IV SOLN
INTRAVENOUS | Status: AC
  Filled 2020-02-19: qty 100

## 2020-02-19 MED ORDER — OXYCODONE-ACETAMINOPHEN 5-325 MG PO TABS: 1.0000 | ORAL_TABLET | Freq: Four times a day (QID) | ORAL | 0 refills | Status: DC | PRN

## 2020-02-19 MED ORDER — ONDANSETRON HCL 4 MG/2ML IJ SOLN
INTRAMUSCULAR | Status: DC | PRN
  Administered 2020-02-19: 4 mg via INTRAVENOUS

## 2020-02-19 MED ORDER — PHENYLEPHRINE 40 MCG/ML (10ML) SYRINGE FOR IV PUSH (FOR BLOOD PRESSURE SUPPORT)
PREFILLED_SYRINGE | INTRAVENOUS | Status: AC
  Filled 2020-02-19: qty 10

## 2020-02-19 MED ORDER — KETOROLAC TROMETHAMINE 30 MG/ML IJ SOLN
INTRAMUSCULAR | Status: DC | PRN
  Administered 2020-02-19: 30 mg via INTRAVENOUS

## 2020-02-19 MED ORDER — FENTANYL CITRATE (PF) 100 MCG/2ML IJ SOLN
25.0000 ug | INTRAMUSCULAR | Status: DC | PRN
  Administered 2020-02-19 (×2): 50 ug via INTRAVENOUS

## 2020-02-19 MED ORDER — FENTANYL CITRATE (PF) 100 MCG/2ML IJ SOLN
INTRAMUSCULAR | Status: AC
  Filled 2020-02-19: qty 2

## 2020-02-19 MED ORDER — SUGAMMADEX SODIUM 200 MG/2ML IV SOLN
INTRAVENOUS | Status: DC | PRN
  Administered 2020-02-19: 200 mg via INTRAVENOUS

## 2020-02-19 MED ORDER — PHENYLEPHRINE 40 MCG/ML (10ML) SYRINGE FOR IV PUSH (FOR BLOOD PRESSURE SUPPORT)
PREFILLED_SYRINGE | INTRAVENOUS | Status: DC | PRN
  Administered 2020-02-19: 120 ug via INTRAVENOUS

## 2020-02-19 MED ORDER — ACETAMINOPHEN 10 MG/ML IV SOLN
1000.0000 mg | Freq: Once | INTRAVENOUS | Status: AC
  Administered 2020-02-19: 1000 mg via INTRAVENOUS

## 2020-02-19 MED ORDER — LACTATED RINGERS IV SOLN: INTRAVENOUS | Status: DC

## 2020-02-19 MED ORDER — BUPIVACAINE HCL (PF) 0.25 % IJ SOLN
INTRAMUSCULAR | Status: DC | PRN
  Administered 2020-02-19: 4 mL

## 2020-02-19 MED ORDER — OXYCODONE HCL 5 MG PO TABS
5.0000 mg | ORAL_TABLET | Freq: Once | ORAL | Status: AC
  Administered 2020-02-19: 5 mg via ORAL

## 2020-02-19 MED ORDER — LIDOCAINE HCL (CARDIAC) PF 100 MG/5ML IV SOSY
PREFILLED_SYRINGE | INTRAVENOUS | Status: DC | PRN
  Administered 2020-02-19: 100 mg via INTRAVENOUS

## 2020-02-19 MED ORDER — ONDANSETRON HCL 4 MG/2ML IJ SOLN
INTRAMUSCULAR | Status: AC
  Filled 2020-02-19: qty 2

## 2020-02-19 MED ORDER — FENTANYL CITRATE (PF) 100 MCG/2ML IJ SOLN
INTRAMUSCULAR | Status: DC | PRN
  Administered 2020-02-19: 100 ug via INTRAVENOUS
  Administered 2020-02-19 (×2): 50 ug via INTRAVENOUS

## 2020-02-19 MED ORDER — ROCURONIUM BROMIDE 10 MG/ML (PF) SYRINGE
PREFILLED_SYRINGE | INTRAVENOUS | Status: AC
  Filled 2020-02-19: qty 10

## 2020-02-19 MED ORDER — MIDAZOLAM HCL 2 MG/2ML IJ SOLN
INTRAMUSCULAR | Status: AC
  Filled 2020-02-19: qty 2

## 2020-02-19 MED ORDER — DEXAMETHASONE SODIUM PHOSPHATE 10 MG/ML IJ SOLN
INTRAMUSCULAR | Status: AC
  Filled 2020-02-19: qty 1

## 2020-02-19 MED ORDER — KETOROLAC TROMETHAMINE 30 MG/ML IJ SOLN
INTRAMUSCULAR | Status: AC
  Filled 2020-02-19: qty 1

## 2020-02-19 MED ORDER — BUPIVACAINE HCL (PF) 0.25 % IJ SOLN
INTRAMUSCULAR | Status: AC
  Filled 2020-02-19: qty 30

## 2020-02-19 MED ORDER — PROMETHAZINE HCL 25 MG/ML IJ SOLN: 6.2500 mg | INTRAMUSCULAR | Status: DC | PRN

## 2020-02-19 MED ORDER — FENTANYL CITRATE (PF) 100 MCG/2ML IJ SOLN: 50.0000 ug | INTRAMUSCULAR | Status: DC | PRN

## 2020-02-19 MED ORDER — PROPOFOL 10 MG/ML IV BOLUS
INTRAVENOUS | Status: DC | PRN
  Administered 2020-02-19: 150 mg via INTRAVENOUS

## 2020-02-19 MED ORDER — DEXAMETHASONE SODIUM PHOSPHATE 10 MG/ML IJ SOLN
INTRAMUSCULAR | Status: DC | PRN
  Administered 2020-02-19: 5 mg via INTRAVENOUS

## 2020-02-19 MED ORDER — KETOROLAC TROMETHAMINE 30 MG/ML IJ SOLN: 30.0000 mg | Freq: Once | INTRAMUSCULAR | Status: DC | PRN

## 2020-02-19 MED ORDER — PROPOFOL 10 MG/ML IV BOLUS
INTRAVENOUS | Status: AC
  Filled 2020-02-19: qty 40

## 2020-02-19 MED ORDER — MIDAZOLAM HCL 2 MG/2ML IJ SOLN: 1.0000 mg | INTRAMUSCULAR | Status: DC | PRN

## 2020-02-19 MED ORDER — OXYCODONE HCL 5 MG PO TABS
ORAL_TABLET | ORAL | Status: AC
  Filled 2020-02-19: qty 1

## 2020-02-19 MED ORDER — LIDOCAINE 2% (20 MG/ML) 5 ML SYRINGE
INTRAMUSCULAR | Status: AC
  Filled 2020-02-19: qty 5

## 2020-02-19 MED ORDER — MIDAZOLAM HCL 2 MG/2ML IJ SOLN
INTRAMUSCULAR | Status: DC | PRN
  Administered 2020-02-19: 2 mg via INTRAVENOUS

## 2020-02-19 MED ORDER — ROCURONIUM BROMIDE 10 MG/ML (PF) SYRINGE
PREFILLED_SYRINGE | INTRAVENOUS | Status: DC | PRN
  Administered 2020-02-19: 50 mg via INTRAVENOUS

## 2020-02-19 SURGICAL SUPPLY — 29 items
ADH SKN CLS APL DERMABOND .7 (GAUZE/BANDAGES/DRESSINGS) ×1
CATH ROBINSON RED A/P 16FR (CATHETERS) ×2 IMPLANT
CLIP FILSHIE TUBAL LIGA STRL (Clip) ×2 IMPLANT
COVER MAYO STAND STRL (DRAPES) IMPLANT
DERMABOND ADVANCED (GAUZE/BANDAGES/DRESSINGS) ×1
DERMABOND ADVANCED .7 DNX12 (GAUZE/BANDAGES/DRESSINGS) ×1 IMPLANT
DRSG OPSITE POSTOP 3X4 (GAUZE/BANDAGES/DRESSINGS) ×1 IMPLANT
DURAPREP 26ML APPLICATOR (WOUND CARE) ×2 IMPLANT
GAUZE 4X4 16PLY RFD (DISPOSABLE) ×2 IMPLANT
GLOVE BIO SURGEON STRL SZ 6.5 (GLOVE) ×2 IMPLANT
GLOVE BIOGEL PI IND STRL 7.0 (GLOVE) ×4 IMPLANT
GLOVE BIOGEL PI INDICATOR 7.0 (GLOVE) ×4
GOWN STRL REUS W/TWL LRG LVL3 (GOWN DISPOSABLE) ×4 IMPLANT
NEEDLE INSUFFLATION 120MM (ENDOMECHANICALS) ×2 IMPLANT
PACK LAPAROSCOPY BASIN (CUSTOM PROCEDURE TRAY) ×2 IMPLANT
PACK TRENDGUARD 450 HYBRID PRO (MISCELLANEOUS) IMPLANT
PACK TRENDGUARD 600 HYBRD PROC (MISCELLANEOUS) IMPLANT
PAD ARMBOARD 7.5X6 YLW CONV (MISCELLANEOUS) ×4 IMPLANT
PAD OB MATERNITY 4.3X12.25 (PERSONAL CARE ITEMS) ×2 IMPLANT
PAD PREP 24X48 CUFFED NSTRL (MISCELLANEOUS) ×2 IMPLANT
SET TUBE SMOKE EVAC HIGH FLOW (TUBING) ×2 IMPLANT
SLEEVE SCD COMPRESS KNEE MED (MISCELLANEOUS) ×4 IMPLANT
SUT VICRYL 0 UR6 27IN ABS (SUTURE) ×2 IMPLANT
SUT VICRYL 4-0 PS2 18IN ABS (SUTURE) ×2 IMPLANT
TOWEL GREEN STERILE FF (TOWEL DISPOSABLE) ×4 IMPLANT
TRENDGUARD 450 HYBRID PRO PACK (MISCELLANEOUS) ×2
TRENDGUARD 600 HYBRID PROC PK (MISCELLANEOUS)
TROCAR XCEL DIL TIP R 11M (ENDOMECHANICALS) ×2 IMPLANT
WARMER LAPAROSCOPE (MISCELLANEOUS) ×2 IMPLANT

## 2020-02-19 NOTE — Anesthesia Postprocedure Evaluation (Signed)
Anesthesia Post Note  Patient: Jessica [name]  Procedure([name]) Performed: LAPAROSCOPIC TUBAL LIGATION (Bilateral Vagina )     Patient location during evaluation: PACU Anesthesia Type: General Level of consciousness: awake and alert Pain management: pain level controlled Vital Signs Assessment: post-procedure vital signs reviewed and stable Respiratory status: spontaneous breathing, nonlabored ventilation, respiratory function stable and patient connected to nasal cannula oxygen Cardiovascular status: blood pressure returned to baseline and stable Postop Assessment: no apparent nausea or vomiting Anesthetic complications: no    Last Vitals:  Vitals:   02/19/20 0930 02/19/20 0934  BP: (!) 145/94   Pulse: 80 85  Resp: 19 19  Temp:    SpO2: 100% 100%    Last Pain:  Vitals:   02/19/20 0934  TempSrc:   PainSc: 7                  Jessica [name]

## 2020-02-19 NOTE — Transfer of Care (Signed)
Immediate Anesthesia Transfer of Care Note  Patient: Jessica [name]  Procedure(s) Performed: LAPAROSCOPIC TUBAL LIGATION (Bilateral Vagina )  Patient Location: PACU  Anesthesia Type:General  Level of Consciousness: awake, alert , oriented, drowsy and patient cooperative  Airway & Oxygen Therapy: Patient Spontanous Breathing and Patient connected to face mask oxygen  Post-op Assessment: Report given to RN and Post -op Vital signs reviewed and stable  Post vital signs: Reviewed and stable  Last Vitals:  Vitals Value Taken Time  BP 140/89 02/19/20 0913  Temp    Pulse 103 02/19/20 0915  Resp 14 02/19/20 0915  SpO2 100 % 02/19/20 0915  Vitals shown include unvalidated device data.  Last Pain:  Vitals:   02/19/20 0702  TempSrc: Oral         Complications: No apparent anesthesia complications

## 2020-02-19 NOTE — Anesthesia Procedure Notes (Signed)
Procedure Name: Intubation Date/Time: 02/19/2020 8:29 AM Performed by: Raenette Rover, CRNA Pre-anesthesia Checklist: Patient identified, Emergency Drugs available, Suction available and Patient being monitored Patient Re-evaluated:Patient Re-evaluated prior to induction Oxygen Delivery Method: Circle system utilized Preoxygenation: Pre-oxygenation with 100% oxygen Induction Type: IV induction Ventilation: Mask ventilation without difficulty Laryngoscope Size: Mac and 3 Grade View: Grade I Tube type: Oral Tube size: 7.0 mm Number of attempts: 1 Airway Equipment and Method: Stylet Placement Confirmation: ETT inserted through vocal cords under direct vision,  breath sounds checked- equal and bilateral and positive ETCO2 Secured at: 21 cm Tube secured with: Tape Dental Injury: Teeth and Oropharynx as per pre-operative assessment

## 2020-02-19 NOTE — Discharge Instructions (Signed)
PT took tylenol at 10:30 am. No tylenol products for 4 hrs minimum   PT took oxycodone 74m IR at 1040 am. Pain med can last up to 6 hrs     Laparoscopic Tubal Ligation, Care After This sheet gives you information about how to care for yourself after your procedure. Your health care provider may also give you more specific instructions. If you have problems or questions, contact your health care provider. What can I expect after the procedure? After the procedure, it is common to have:  A sore throat.  Discomfort in your shoulder.  Mild discomfort or cramping in your abdomen.  Gas pains.  Pain or soreness in the area where the surgical incision was made.  A bloated feeling.  Tiredness.  Nausea.  Vomiting. Follow these instructions at home: Medicines  Take over-the-counter and prescription medicines only as told by your health care provider.  Do not take aspirin because it can cause bleeding.  Ask your health care provider if the medicine prescribed to you: ? Requires you to avoid driving or using heavy machinery. ? Can cause constipation. You may need to take actions to prevent or treat constipation, such as:  Drink enough fluid to keep your urine pale yellow.  Take over-the-counter or prescription medicines.  Eat foods that are high in fiber, such as beans, whole grains, and fresh fruits and vegetables.  Limit foods that are high in fat and processed sugars, such as fried or sweet foods. Incision care      Follow instructions from your health care provider about how to take care of your incision. Make sure you: ? Wash your hands with soap and water before and after you change your bandage (dressing). If soap and water are not available, use hand sanitizer. ? Change your dressing as told by your health care provider. ? Leave stitches (sutures), skin glue, or adhesive strips in place. These skin closures may need to stay in place for 2 weeks or longer. If  adhesive strip edges start to loosen and curl up, you may trim the loose edges. Do not remove adhesive strips completely unless your health care provider tells you to do that.  Check your incision area every day for signs of infection. Check for: ? Redness, swelling, or pain. ? Fluid or blood. ? Warmth. ? Pus or a bad smell. Activity  Rest as told by your health care provider.  Avoid sitting for a long time without moving. Get up to take short walks every 1-2 hours. This is important to improve blood flow and breathing. Ask for help if you feel weak or unsteady.  Return to your normal activities as told by your health care provider. Ask your health care provider what activities are safe for you. General instructions  Do not take baths, swim, or use a hot tub until your health care provider approves. Ask your health care provider if you may take showers. You may only be allowed to take sponge baths.  Have someone help you with your daily household tasks for the first few days.  Keep all follow-up visits as told by your health care provider. This is important. Contact a health care provider if:  You have redness, swelling, or pain around your incision.  Your incision feels warm to the touch.  You have pus or a bad smell coming from your incision.  The edges of your incision break open after the sutures have been removed.  Your pain does not improve after 2-3  days.  You have a rash.  You repeatedly become dizzy or light-headed.  Your pain medicine is not helping. Get help right away if you:  Have a fever.  Faint.  Have increasing pain in your abdomen.  Have severe pain in one or both of your shoulders.  Have fluid or blood coming from your sutures or from your vagina.  Have shortness of breath or difficulty breathing.  Have chest pain or leg pain.  Have ongoing nausea, vomiting, or diarrhea. Summary  After the procedure, it is common to have mild discomfort or  cramping in your abdomen.  Take over-the-counter and prescription medicines only as told by your health care provider.  Watch for symptoms that should prompt you to call your health care provider.  Keep all follow-up visits as told by your health care provider. This is important. This information is not intended to replace advice given to you by your health care provider. Make sure you discuss any questions you have with your health care provider. Document Revised: 05/14/2019 Document Reviewed: 10/30/2018 Elsevier Patient Education  2020 [employer].

## 2020-02-19 NOTE — Op Note (Signed)
Jessica [name] 02/19/2020  PREOPERATIVE DIAGNOSIS:  Undesired fertility  POSTOPERATIVE DIAGNOSIS:  Undesired fertility  PROCEDURE:  Laparoscopic Bilateral Tubal Sterilization using Filshie Clips   SURGEON: Woodroe Mode, MD   ANESTHESIA:  General endotracheal  COMPLICATIONS:  None immediate.  ESTIMATED BLOOD LOSS:  Less than 20 ml.  FLUIDS: 1000 ml LR.  URINE OUTPUT:  75 ml of clear urine.  INDICATIONS: 25 y.o. [id number]  with undesired fertility, desires permanent sterilization. Other reversible forms of contraception were discussed with patient; she declines all other modalities.  Risks of procedure discussed with patient including permanence of method, bleeding, infection, injury to surrounding organs and need for additional procedures including laparotomy, risk of regret.  Failure risk of 0.5-1% with increased risk of ectopic gestation if pregnancy occurs was also discussed with patient.      FINDINGS:  Normal uterus, tubes, and ovaries.  TECHNIQUE:  The patient was taken to the operating room where general anesthesia was obtained without difficulty.  She was then placed in the dorsal lithotomy position and prepared and draped in sterile fashion.  After an adequate timeout was performed, a bivalved speculum was then placed in the patient's vagina, and the anterior lip of cervix grasped with the single-tooth tenaculum.  The uterine manipulator was then advanced into the uterus.  The speculum was removed from the vagina.  Attention was then turned to the patient's abdomen where a 11-mm skin incision was made in the umbilical fold.  The 11-mm trocar and sleeve were then advanced without difficulty into the abdomen.  The abdomen was then insufflated with carbon dioxide gas and adequate pneumoperitoneum was obtained.  A survey of the patient's pelvis and abdomen revealed entirely normal anatomy.  The tip of the [place]ks tenaculum was visible though a thin layer of tissue so this was used  gingerly to avoid entering the cavity with the device. The fallopian tubes were observed and found to be normal in appearance. The Filshie clip applicator was placed through the operative port, and a Filshie clip was placed on the right fallopian tube ,about 2 cm from the cornual attachment, with care given to incorporate the underlying mesosalpinx.  2 clips were applied to assure complete occlusion of the tube  A similar process was carried out on the contralateral side using 2 clips allowing for bilateral tubal sterilization.   Good hemostasis was noted overall.The instruments were then removed from the patient's abdomen and the fascial incision was repaired with 0 Vicryl, and the skin was closed with Dermabond.  The uterine manipulator and the tenaculum were removed from the vagina without complications. The patient tolerated the procedure well.  Sponge, lap, and needle counts were correct times two.  The patient was then taken to the recovery room awake, extubated and in stable condition.  Woodroe Mode, MD 02/19/2020 9:12 AM

## 2020-02-19 NOTE — Anesthesia Preprocedure Evaluation (Signed)
Anesthesia Evaluation  Patient identified by MRN, date of birth, ID band Patient awake    Reviewed: Allergy & Precautions, NPO status , Patient's Chart, lab work & pertinent test results  Airway Mallampati: II  TM Distance: >3 FB Neck ROM: Full    Dental no notable dental hx.    Pulmonary neg pulmonary ROS,    Pulmonary exam normal breath sounds clear to auscultation       Cardiovascular negative cardio ROS Normal cardiovascular exam Rhythm:Regular Rate:Normal     Neuro/Psych negative neurological ROS  negative psych ROS   GI/Hepatic Neg liver ROS,   Endo/Other  negative endocrine ROS  Renal/GU negative Renal ROS  negative genitourinary   Musculoskeletal negative musculoskeletal ROS (+)   Abdominal   Peds negative pediatric ROS (+)  Hematology negative hematology ROS (+)   Anesthesia Other Findings   Reproductive/Obstetrics negative OB ROS                             Anesthesia Physical Anesthesia Plan  ASA: II  Anesthesia Plan: General   Post-op Pain Management:    Induction: Intravenous  PONV Risk Score and Plan: Ondansetron, Dexamethasone and Treatment may vary due to age or medical condition  Airway Management Planned: Oral ETT  Additional Equipment:   Intra-op Plan:   Post-operative Plan: Extubation in OR  Informed Consent: I have reviewed the patients History and Physical, chart, labs and discussed the procedure including the risks, benefits and alternatives for the proposed anesthesia with the patient or authorized representative who has indicated his/her understanding and acceptance.     Dental advisory given  Plan Discussed with: CRNA and Surgeon  Anesthesia Plan Comments:         Anesthesia Quick Evaluation

## 2020-02-19 NOTE — H&P (Signed)
Jessica [name] is an 24 y.o. female. [id number] Patient's last menstrual period was 01/15/2020. She has received DMPA but desires permanent sterilization and is here for LBTL.   Pertinent Gynecological History: Contraception: Depo-Provera injections DES exposure: denies Blood transfusions: none Sexually transmitted diseases: no past history Last pap: normal Date: 2020    Menstrual History:  Patient's last menstrual period was 01/15/2020.    Past Medical History:  Diagnosis Date  . Anemia   . Crohn's disease ([place])   . Osteoporosis   . Supervision of normal first pregnancy 05/03/2013  . Vitamin D deficiency     Past Surgical History:  Procedure Laterality Date  . COLONOSCOPY N/A 01/22/2015   Procedure: COLONOSCOPY;  Surgeon: [name], MD;  Location: [facility];  Service: Endoscopy;  Laterality: N/A;  . DILATION AND EVACUATION N/A 10/31/2014   Procedure: DILATATION AND EVACUATION;  Surgeon: Lahoma Crocker, MD;  Location: [facility];  Service: Gynecology;  Laterality: N/A;  . ESOPHAGOGASTRODUODENOSCOPY N/A 01/22/2015   Procedure: ESOPHAGOGASTRODUODENOSCOPY (EGD);  Surgeon: [name], MD;  Location: [facility];  Service: Endoscopy;  Laterality: N/A;  . FLEXIBLE SIGMOIDOSCOPY N/A 11/04/2014   Procedure: FLEXIBLE SIGMOIDOSCOPY;  Surgeon: [name], MD;  Location: [facility];  Service: Endoscopy;  Laterality: N/A;  . ILEOSTOMY CLOSURE N/A 12/18/2014   Procedure: LOOP ILEOSTOMY REVERSAL;  Surgeon: [name], MD;  Location: [facility];  Service: General;  Laterality: N/A;  . INCISION AND DRAINAGE Left    arm  . SMALL INTESTINE SURGERY    . WISDOM TOOTH EXTRACTION      Family History  Problem Relation Age of Onset  . Diabetes Father   . Diabetes Maternal Grandmother   . Heart disease Maternal Grandmother   . Hypertension Maternal Grandmother   . Kidney disease Maternal Grandmother   . Lung cancer Maternal Grandfather   . Hyperlipidemia Maternal Grandfather   .  Stroke Neg Hx   . Colon cancer Neg Hx     Social History:  reports that she has never smoked. She has never used smokeless tobacco. She reports that she does not drink alcohol or use drugs.  Allergies: No Known Allergies  Medications Prior to Admission  Medication Sig Dispense Refill Last Dose  . acetaminophen (TYLENOL) 325 MG tablet Take 650 mg by mouth every 6 (six) hours as needed.   Past Week at Unknown time  . Vedolizumab (ENTYVIO IV) Inject 1 Dose into the vein every 8 (eight) weeks. infusion   Past Month at Unknown time  . medroxyPROGESTERone (DEPO-PROVERA) 150 MG/ML injection Inject 1 mL (150 mg total) into the muscle every 3 (three) months. 1 mL 0 More than a month at Unknown time    Review of Systems  Constitutional: Negative.   Respiratory: Negative.   Gastrointestinal: Negative.   Genitourinary: Negative.   Musculoskeletal: Negative.     Blood pressure 128/78, pulse 86, temperature 98.4 F (36.9 C), temperature source Oral, resp. rate 20, height 5' 6"  (1.676 m), weight 90.8 kg, last menstrual period 01/15/2020, SpO2 100 %, not currently breastfeeding. Physical Exam  Constitutional: She is oriented to person, place, and time. She appears well-developed and well-nourished. No distress.  Respiratory: Effort normal.  GI: She exhibits no distension.  Musculoskeletal:     Cervical back: Neck supple.  Neurological: She is alert and oriented to person, place, and time.  Skin: Skin is warm and dry.  Psychiatric: She has a normal mood and affect. Her behavior is normal.    Results  for orders placed or performed during the hospital encounter of 02/19/20 (from the past 24 hour(s))  Pregnancy, urine POC     Status: None   Collection Time: 02/19/20  6:57 AM  Result Value Ref Range   Preg Test, Ur NEGATIVE NEGATIVE   CBC    Component Value Date/Time   WBC 8.4 02/13/2020 1402   RBC 4.67 02/13/2020 1402   HGB 11.5 (L) 02/13/2020 1402   HGB 10.1 (L) 10/16/2019 1420   HGB  11.0 01/23/2013 0000   HCT 36.2 02/13/2020 1402   HCT 32.5 (L) 10/16/2019 1420   HCT 33 01/23/2013 0000   PLT 322.0 02/13/2020 1402   PLT 271 10/16/2019 1420   PLT 226 01/23/2013 0000   MCV 77.6 (L) 02/13/2020 1402   MCV 77 (L) 10/16/2019 1420   MCH 24.3 (L) 10/18/2019 1448   MCHC 31.6 02/13/2020 1402   RDW 15.7 (H) 02/13/2020 1402   RDW 13.0 10/16/2019 1420   LYMPHSABS 2.6 02/13/2020 1402   LYMPHSABS 1.4 04/18/2019 1028   MONOABS 0.6 02/13/2020 1402   EOSABS 0.1 02/13/2020 1402   EOSABS 0.0 04/18/2019 1028   BASOSABS 0.0 02/13/2020 1402   BASOSABS 0.0 04/18/2019 1028      Assessment/Plan: 24 y.o. [id number] with undesired fertility desires permanent sterilization. Risks and benefits of laparoscopic tubal sterilization procedure was discussed with the patient including permanence of method, regret, use of Filshie clips, bleeding, infection, injury to surrounding organs, anesthesia and need for additional procedures. Risk failure of 0.5-1% with increased risk of ectopic gestation if pregnancy occurs was also discussed with patient. Patient verbalized understanding and all questions were answered.  Alvina Filbert Roselie Awkward MD 02/19/2020 8:10 AM     Emeterio Reeve 02/19/2020, 8:06 AM

## 2020-03-02 ENCOUNTER — Encounter: Payer: Self-pay | Admitting: *Deleted

## 2020-03-04 ENCOUNTER — Inpatient Hospital Stay (HOSPITAL_COMMUNITY): Admission: RE | Admit: 2020-03-04 | Payer: Medicaid Other | Source: Ambulatory Visit

## 2020-03-09 ENCOUNTER — Encounter (HOSPITAL_COMMUNITY): Payer: Medicaid Other

## 2020-03-12 ENCOUNTER — Encounter: Payer: Medicaid Other | Admitting: Obstetrics & Gynecology

## 2020-03-18 ENCOUNTER — Encounter (HOSPITAL_COMMUNITY): Payer: Medicaid Other

## 2020-03-23 ENCOUNTER — Other Ambulatory Visit: Payer: Self-pay

## 2020-03-23 ENCOUNTER — Encounter: Payer: Self-pay | Admitting: Obstetrics & Gynecology

## 2020-03-23 ENCOUNTER — Ambulatory Visit (INDEPENDENT_AMBULATORY_CARE_PROVIDER_SITE_OTHER): Payer: Medicaid Other | Admitting: Obstetrics & Gynecology

## 2020-03-23 VITALS — BP 130/88 | HR 86 | Wt 198.0 lb

## 2020-03-23 DIAGNOSIS — Z9889 Other specified postprocedural states: Secondary | ICD-10-CM

## 2020-03-23 NOTE — Patient Instructions (Signed)
Laparoscopic Tubal Ligation, Care After This sheet gives you information about how to care for yourself after your procedure. Your health care provider may also give you more specific instructions. If you have problems or questions, contact your health care provider. What can I expect after the procedure? After the procedure, it is common to have:  A sore throat.  Discomfort in your shoulder.  Mild discomfort or cramping in your abdomen.  Gas pains.  Pain or soreness in the area where the surgical incision was made.  A bloated feeling.  Tiredness.  Nausea.  Vomiting. Follow these instructions at home: Medicines  Take over-the-counter and prescription medicines only as told by your health care provider.  Do not take aspirin because it can cause bleeding.  Ask your health care provider if the medicine prescribed to you: ? Requires you to avoid driving or using heavy machinery. ? Can cause constipation. You may need to take actions to prevent or treat constipation, such as:  Drink enough fluid to keep your urine pale yellow.  Take over-the-counter or prescription medicines.  Eat foods that are high in fiber, such as beans, whole grains, and fresh fruits and vegetables.  Limit foods that are high in fat and processed sugars, such as fried or sweet foods. Incision care      Follow instructions from your health care provider about how to take care of your incision. Make sure you: ? Wash your hands with soap and water before and after you change your bandage (dressing). If soap and water are not available, use hand sanitizer. ? Change your dressing as told by your health care provider. ? Leave stitches (sutures), skin glue, or adhesive strips in place. These skin closures may need to stay in place for 2 weeks or longer. If adhesive strip edges start to loosen and curl up, you may trim the loose edges. Do not remove adhesive strips completely unless your health care provider  tells you to do that.  Check your incision area every day for signs of infection. Check for: ? Redness, swelling, or pain. ? Fluid or blood. ? Warmth. ? Pus or a bad smell. Activity  Rest as told by your health care provider.  Avoid sitting for a long time without moving. Get up to take short walks every 1-2 hours. This is important to improve blood flow and breathing. Ask for help if you feel weak or unsteady.  Return to your normal activities as told by your health care provider. Ask your health care provider what activities are safe for you. General instructions  Do not take baths, swim, or use a hot tub until your health care provider approves. Ask your health care provider if you may take showers. You may only be allowed to take sponge baths.  Have someone help you with your daily household tasks for the first few days.  Keep all follow-up visits as told by your health care provider. This is important. Contact a health care provider if:  You have redness, swelling, or pain around your incision.  Your incision feels warm to the touch.  You have pus or a bad smell coming from your incision.  The edges of your incision break open after the sutures have been removed.  Your pain does not improve after 2-3 days.  You have a rash.  You repeatedly become dizzy or light-headed.  Your pain medicine is not helping. Get help right away if you:  Have a fever.  Faint.  Have increasing  pain in your abdomen.  Have severe pain in one or both of your shoulders.  Have fluid or blood coming from your sutures or from your vagina.  Have shortness of breath or difficulty breathing.  Have chest pain or leg pain.  Have ongoing nausea, vomiting, or diarrhea. Summary  After the procedure, it is common to have mild discomfort or cramping in your abdomen.  Take over-the-counter and prescription medicines only as told by your health care provider.  Watch for symptoms that should  prompt you to call your health care provider.  Keep all follow-up visits as told by your health care provider. This is important. This information is not intended to replace advice given to you by your health care provider. Make sure you discuss any questions you have with your health care provider. Document Revised: 05/14/2019 Document Reviewed: 10/30/2018 Elsevier Patient Education  2020 [employer].

## 2020-03-23 NOTE — Progress Notes (Signed)
Subjective:     Jessica [name] is a 24 y.o. female who presents to the clinic 4 weeks status post LBTL for requested sterilization. Eating a regular diet without difficulty. Bowel movements are normal. The patient is not having any pain.  The following portions of the patient's history were reviewed and updated as appropriate: allergies, current medications, past family history, past medical history, past social history, past surgical history and problem list.  Review of Systems Pertinent items are noted in HPI.    Objective:    BP 130/88   Pulse 86   Wt 198 lb (89.8 kg)   BMI 31.96 kg/m  General:  alert, cooperative and no distress  Abdomen: soft, bowel sounds active, non-tender  Incision:   healing well, no drainage, no erythema, no hernia, no seroma, no swelling, no dehiscence, incision well approximated     Assessment:    Doing well postoperatively. Operative findings again reviewed. Pathology report discussed.    Plan:    1. Continue any current medications. 2. Wound care discussed. 3. Activity restrictions: none 4. Anticipated return to work: now. 5. Follow up:  as needed  Woodroe Mode, MD 03/23/2020

## 2020-03-25 ENCOUNTER — Telehealth: Payer: Self-pay

## 2020-03-25 ENCOUNTER — Ambulatory Visit (HOSPITAL_COMMUNITY)
Admission: RE | Admit: 2020-03-25 | Discharge: 2020-03-25 | Disposition: A | Discharge status: Home / Self Care | Payer: Medicaid Other | Source: Ambulatory Visit | Attending: Gastroenterology | Admitting: Gastroenterology

## 2020-03-25 ENCOUNTER — Other Ambulatory Visit: Payer: Self-pay

## 2020-03-25 DIAGNOSIS — K50819 Crohn's disease of both small and large intestine with unspecified complications: Secondary | ICD-10-CM | POA: Diagnosis not present

## 2020-03-25 MED ORDER — VEDOLIZUMAB 300 MG IV SOLR
300.0000 mg | INTRAVENOUS | Status: DC
  Filled 2020-03-25: qty 5

## 2020-03-25 MED ORDER — VEDOLIZUMAB 300 MG IV SOLR
300.0000 mg | Freq: Once | INTRAVENOUS | Status: AC
  Administered 2020-03-25: 300 mg via INTRAVENOUS
  Filled 2020-03-25: qty 5

## 2020-03-25 NOTE — Telephone Encounter (Signed)
Short Stay is calling to get the "go ahead" to give the patient her Entyvio infusion. Last had it 01/27/20 after stopping it for a period of time. Please review and advise.

## 2020-03-25 NOTE — Telephone Encounter (Signed)
Medical Day notified.

## 2020-03-25 NOTE — Telephone Encounter (Signed)
Yes okay to proceed and continue with Entyvio, thanks

## 2020-03-25 NOTE — Progress Notes (Signed)
Per Beth / Dr. Havery Moros patient should be scheduled now Q 8 weeks.

## 2020-04-01 ENCOUNTER — Ambulatory Visit: Payer: Medicaid Other

## 2020-04-22 ENCOUNTER — Encounter (HOSPITAL_COMMUNITY): Payer: Medicaid Other

## 2020-05-20 ENCOUNTER — Encounter (HOSPITAL_COMMUNITY): Admission: RE | Admit: 2020-05-20 | Payer: Medicaid Other | Source: Ambulatory Visit

## 2020-05-27 ENCOUNTER — Ambulatory Visit (HOSPITAL_COMMUNITY)
Admission: RE | Admit: 2020-05-27 | Discharge: 2020-05-27 | Disposition: A | Discharge status: Home / Self Care | Payer: Medicaid Other | Source: Ambulatory Visit | Attending: Gastroenterology | Admitting: Gastroenterology

## 2020-05-27 ENCOUNTER — Other Ambulatory Visit: Payer: Self-pay

## 2020-05-27 DIAGNOSIS — K50819 Crohn's disease of both small and large intestine with unspecified complications: Secondary | ICD-10-CM

## 2020-05-27 MED ORDER — VEDOLIZUMAB 300 MG IV SOLR
300.0000 mg | Freq: Once | INTRAVENOUS | Status: AC
  Administered 2020-05-27: 300 mg via INTRAVENOUS
  Filled 2020-05-27: qty 5

## 2020-06-10 DIAGNOSIS — H04123 Dry eye syndrome of bilateral lacrimal glands: Secondary | ICD-10-CM | POA: Diagnosis not present

## 2020-07-15 ENCOUNTER — Encounter (HOSPITAL_COMMUNITY): Payer: Medicaid Other

## 2020-07-21 ENCOUNTER — Ambulatory Visit: Payer: Medicaid Other | Admitting: Gastroenterology

## 2020-07-21 ENCOUNTER — Other Ambulatory Visit (INDEPENDENT_AMBULATORY_CARE_PROVIDER_SITE_OTHER): Payer: Medicaid Other

## 2020-07-21 ENCOUNTER — Encounter: Payer: Self-pay | Admitting: Gastroenterology

## 2020-07-21 VITALS — BP 124/80 | HR 85 | Ht 66.0 in | Wt 211.4 lb

## 2020-07-21 DIAGNOSIS — K50818 Crohn's disease of both small and large intestine with other complication: Secondary | ICD-10-CM

## 2020-07-21 DIAGNOSIS — E559 Vitamin D deficiency, unspecified: Secondary | ICD-10-CM

## 2020-07-21 LAB — CBC WITH DIFFERENTIAL/PLATELET
Basophils Absolute: 0 10*3/uL (ref 0.0–0.1)
Basophils Relative: 0.7 % (ref 0.0–3.0)
Eosinophils Absolute: 0.1 10*3/uL (ref 0.0–0.7)
Eosinophils Relative: 1.2 % (ref 0.0–5.0)
HCT: 36.1 % (ref 36.0–46.0)
Hemoglobin: 11.9 g/dL — ABNORMAL LOW (ref 12.0–15.0)
Lymphocytes Relative: 28.7 % (ref 12.0–46.0)
Lymphs Abs: 1.7 10*3/uL (ref 0.7–4.0)
MCHC: 32.9 g/dL (ref 30.0–36.0)
MCV: 82.7 fl (ref 78.0–100.0)
Monocytes Absolute: 0.5 10*3/uL (ref 0.1–1.0)
Monocytes Relative: 8.6 % (ref 3.0–12.0)
Neutro Abs: 3.7 10*3/uL (ref 1.4–7.7)
Neutrophils Relative %: 60.8 % (ref 43.0–77.0)
Platelets: 280 10*3/uL (ref 150.0–400.0)
RBC: 4.36 Mil/uL (ref 3.87–5.11)
RDW: 14 % (ref 11.5–15.5)
WBC: 6.1 10*3/uL (ref 4.0–10.5)

## 2020-07-21 LAB — VITAMIN D 25 HYDROXY (VIT D DEFICIENCY, FRACTURES): VITD: 13.23 ng/mL — ABNORMAL LOW (ref 30.00–100.00)

## 2020-07-21 NOTE — Patient Instructions (Signed)
If you are age 26 or older, your body mass index should be between 23-30. Your Body mass index is 34.12 kg/m. If this is out of the aforementioned range listed, please consider follow up with your Primary Care Provider.  If you are age 59 or younger, your body mass index should be between 19-25. Your Body mass index is 34.12 kg/m. If this is out of the aformentioned range listed, please consider follow up with your Primary Care Provider.   Please go to the lab in the basement of our building to have lab work done as you leave today. Hit "B" for basement when you get on the elevator.  When the doors open the lab is on your left.  We will call you with the results. Thank you.  Due to recent changes in healthcare laws, you may see the results of your imaging and laboratory studies on MyChart before your provider has had a chance to review them.  We understand that in some cases there may be results that are confusing or concerning to you. Not all laboratory results come back in the same time frame and the provider may be waiting for multiple results in order to interpret others.  Please give [place] 48 hours in order for your provider to thoroughly review all the results before contacting the office for clarification of your results.    Thank you for entrusting me with your care and for choosing [facility], Dr.  Cellar

## 2020-07-21 NOTE — Progress Notes (Signed)
HPI :  Crohn's history History of Crohns diagnosed in 2012. 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 She was hospitalized for diversion colitis in 2015. Ileostomy takedown was done in 2016. She had a post-operative colonoscopy in February 2016 showing mildly active disease, and was placed on Humira in March 2016.She developed antibody on this regimen, Humira was increased and started on 6MP. The antibody level went away but Humira level remained low despite optimal dosing. Started on [facility] November 2018. Follow up colonoscopy in 2019 looked normal.   SINCE LAST VISIT:  24 year old female here for a follow-up visit for Crohn's disease.  Since of last seen her she got through her pregnancy, she had a mild flare at the end of her pregnancy when we held her Weyman Rodney that led to use of prednisone which worked well.  She resumed her Entyvio postpartum and since then has been doing really well.  She is not had any flare of symptoms that bother her.  No bowel issues.  No rectal bleeding.  No abdominal pains at all.  She feels at baseline and normal.  She generally tolerates the Entyvio okay but does notice that she gets a migraine headache the day of the infusion which tends to go away within a day.  She either uses Tylenol or Excedrin, the latter helps when she takes it.  She was noted to have a microcytosis on her last CBC, recommended an iron panel which she has not done yet.  She had vitamin D deficiency on last set of labs, has been taking 5000 units of vitamin D a day.  She has no complaints otherwise.  She does not smoke cigarettes.  She has historically declined pneumonia vaccine and has declined Covid vaccine.  She did get her flu shot last year.   Prior endoscopic evaluation: Colonoscopy 08/14/2018 -normal ileum, normal  anastomosisin the ascending colon, no inflammation Colonoscopy 2/16 - patent anastomosis with some inflammatory changes, ileum with some ulcerations EGD 2/16 - normal  Healthcare maintenance: Shehasdeclined pneumovax / COVID 19, while flu shot UTD 2020 Vitamin D level of 11.30 on 02/13/20 TB testing negative 02/13/20   Past Medical History:  Diagnosis Date  . Anemia   . Crohn's disease ([place])   . Osteoporosis   . Supervision of normal first pregnancy 05/03/2013  . Vitamin D deficiency      Past Surgical History:  Procedure Laterality Date  . COLONOSCOPY N/A 01/22/2015   Procedure: COLONOSCOPY;  Surgeon: [name], MD;  Location: [facility];  Service: Endoscopy;  Laterality: N/A;  . DILATION AND EVACUATION N/A 10/31/2014   Procedure: DILATATION AND EVACUATION;  Surgeon: Lahoma Crocker, MD;  Location: [place] [facility];  Service: Gynecology;  Laterality: N/A;  . ESOPHAGOGASTRODUODENOSCOPY N/A 01/22/2015   Procedure: ESOPHAGOGASTRODUODENOSCOPY (EGD);  Surgeon: [name], MD;  Location: [facility];  Service: Endoscopy;  Laterality: N/A;  . FLEXIBLE SIGMOIDOSCOPY N/A 11/04/2014   Procedure: FLEXIBLE SIGMOIDOSCOPY;  Surgeon: [name], MD;  Location: [facility];  Service: Endoscopy;  Laterality: N/A;  . ILEOSTOMY CLOSURE N/A 12/18/2014   Procedure: LOOP ILEOSTOMY REVERSAL;  Surgeon: [name], MD;  Location: [facility];  Service: General;  Laterality: N/A;  . INCISION AND DRAINAGE Left    arm  . LAPAROSCOPIC TUBAL LIGATION Bilateral 02/19/2020   Procedure: LAPAROSCOPIC TUBAL LIGATION;  Surgeon: Emeterio Reeve  G, MD;  Location: [place];  Service: Gynecology;  Laterality: Bilateral;  . SMALL INTESTINE SURGERY    . WISDOM TOOTH EXTRACTION     Family History  Problem Relation Age of Onset  . Diabetes Father   . Diabetes Maternal Grandmother   . Heart disease Maternal Grandmother   . Hypertension Maternal Grandmother   . Kidney disease Maternal Grandmother   .  Lung cancer Maternal Grandfather   . Hyperlipidemia Maternal Grandfather   . Stroke Neg Hx   . Colon cancer Neg Hx    Social History   Tobacco Use  . Smoking status: Never Smoker  . Smokeless tobacco: Never Used  . Tobacco comment: occasional cigarette  Vaping Use  . Vaping Use: Never used  Substance Use Topics  . Alcohol use: No    Alcohol/week: 0.0 standard drinks  . Drug use: No   Current Outpatient Medications  Medication Sig Dispense Refill  . Vedolizumab (ENTYVIO IV) Inject 1 Dose into the vein every 8 (eight) weeks. infusion     No current facility-administered medications for this visit.   No Known Allergies   Review of Systems: All systems reviewed and negative except where noted in HPI.   Lab Results  Component Value Date   WBC 8.4 02/13/2020   HGB 11.5 (L) 02/13/2020   HCT 36.2 02/13/2020   MCV 77.6 (L) 02/13/2020   PLT 322.0 02/13/2020    Lab Results  Component Value Date   CREATININE 0.76 02/13/2020   BUN 13 02/13/2020   NA 137 02/13/2020   K 4.0 02/13/2020   CL 106 02/13/2020   CO2 22 02/13/2020    Lab Results  Component Value Date   ALT 16 02/13/2020   AST 19 02/13/2020   ALKPHOS 65 02/13/2020   BILITOT 0.3 02/13/2020     Physical Exam: BP 124/80 (BP Location: Left Arm, Patient Position: Sitting)   Pulse 85   Ht 5' 6"  (1.676 m)   Wt 211 lb 6.4 oz (95.9 kg)   SpO2 98%   BMI 34.12 kg/m  Constitutional: Pleasant,well-developed, female in no acute distress. Abdominal: Soft, nondistended, nontender.  There are no masses palpable. . Extremities: no edema Neurological: Alert and oriented to person place and time. Skin: Skin is warm and dry. No rashes noted. Psychiatric: Normal mood and affect. Behavior is normal.   ASSESSMENT AND PLAN: 24 year old female here for reassessment of the following:  Crohn's disease / vitamin D deficiency - history of perforating ileocolonic Crohn's disease, developed antibodies to Humira and failed dose  optimization despite 6-MP, transition to [place] since 2018 and has been doing really well.  As above she does get some headaches with the infusion, recommend premedicating with Tylenol to see if we can per help prevent this otherwise can use Excedrin as needed if rare use.  Symptomatically otherwise Weyman Rodney is controlling her quite well.  We discussed the regimen and she wants to continue it.  We will repeat her CBC today to see if microcytosis has resolved, also check iron studies.  She has been supplementing vitamin D since have last seen her, will recheck vitamin D level.  I discussed noninvasive monitoring of her Crohn's and recommend a fecal calprotectin to ensure okay, she was agreeable to this. Tuberculosis testing up-to-date.  She has declined Covid vaccine and Pneumovax, if she reconsiders would recommend that.  Otherwise we will see her again in 6 months if not sooner with any issues.  She agreed   Remo Lipps  Havery Moros, MD [facility]

## 2020-07-22 ENCOUNTER — Inpatient Hospital Stay (HOSPITAL_COMMUNITY): Admission: RE | Admit: 2020-07-22 | Payer: Medicaid Other | Source: Ambulatory Visit

## 2020-07-22 ENCOUNTER — Telehealth: Payer: Self-pay | Admitting: Gastroenterology

## 2020-07-22 ENCOUNTER — Other Ambulatory Visit: Payer: Self-pay

## 2020-07-22 MED ORDER — FERROUS SULFATE 325 (65 FE) MG PO TBEC: DELAYED_RELEASE_TABLET | ORAL | 5 refills | Status: DC

## 2020-07-22 NOTE — Telephone Encounter (Signed)
Patient called states the medication sent today requires prior auth from insurance.

## 2020-07-22 NOTE — Telephone Encounter (Signed)
Called and spoke to pt.  She will purchase over the counter.

## 2020-07-25 LAB — IRON,TIBC AND FERRITIN PANEL
%SAT: 9 % (calc) — ABNORMAL LOW (ref 16–45)
Ferritin: 4 ng/mL — ABNORMAL LOW (ref 16–154)
Iron: 33 ug/dL — ABNORMAL LOW (ref 40–190)
TIBC: 382 mcg/dL (calc) (ref 250–450)

## 2020-07-25 LAB — CALPROTECTIN: Calprotectin: 43 mcg/g

## 2020-07-27 ENCOUNTER — Other Ambulatory Visit: Payer: Self-pay

## 2020-07-27 DIAGNOSIS — D649 Anemia, unspecified: Secondary | ICD-10-CM

## 2020-07-27 DIAGNOSIS — K50818 Crohn's disease of both small and large intestine with other complication: Secondary | ICD-10-CM

## 2020-07-29 ENCOUNTER — Inpatient Hospital Stay (HOSPITAL_COMMUNITY): Admission: RE | Admit: 2020-07-29 | Payer: Medicaid Other | Source: Ambulatory Visit

## 2020-09-07 ENCOUNTER — Other Ambulatory Visit: Payer: Self-pay

## 2020-09-07 ENCOUNTER — Ambulatory Visit (HOSPITAL_COMMUNITY)
Admission: RE | Admit: 2020-09-07 | Discharge: 2020-09-07 | Disposition: A | Discharge status: Home / Self Care | Payer: Medicaid Other | Source: Ambulatory Visit | Attending: Gastroenterology | Admitting: Gastroenterology

## 2020-09-07 DIAGNOSIS — K50818 Crohn's disease of both small and large intestine with other complication: Secondary | ICD-10-CM | POA: Insufficient documentation

## 2020-09-07 MED ORDER — VEDOLIZUMAB 300 MG IV SOLR
300.0000 mg | Freq: Once | INTRAVENOUS | Status: AC
  Administered 2020-09-07: 300 mg via INTRAVENOUS
  Filled 2020-09-07: qty 5

## 2020-09-16 ENCOUNTER — Encounter (HOSPITAL_COMMUNITY): Payer: Medicaid Other

## 2020-09-21 ENCOUNTER — Telehealth: Payer: Self-pay

## 2020-09-21 NOTE — Telephone Encounter (Signed)
Sent patient a reminder via My Chart.

## 2020-09-21 NOTE — Telephone Encounter (Signed)
-----   Message from Marlon Pel, RN sent at 09/21/2020  1:11 PM EDT ----- Regarding: FW: Labs  ----- Message ----- From: Yevette Edwards, RN Sent: 09/21/2020 To: Marlon Pel, RN Subject: Labs                                           Repeat CBC, IBC + Ferritin, order in epic

## 2020-09-23 ENCOUNTER — Encounter (HOSPITAL_COMMUNITY): Payer: Medicaid Other

## 2020-10-28 ENCOUNTER — Telehealth: Payer: Self-pay

## 2020-10-28 DIAGNOSIS — E559 Vitamin D deficiency, unspecified: Secondary | ICD-10-CM

## 2020-10-28 NOTE — Telephone Encounter (Signed)
-----   Message from Roetta Sessions, [place] sent at 07/22/2020  8:56 AM EDT ----- Regarding: recheck Vitamin D Recheck Vitamin D (25 hydroxy). Patient increased her Vitamin D to 2000 IUs daily in August

## 2020-10-28 NOTE — Telephone Encounter (Signed)
Called and LM for pt to go to the lab for lab work. Vitamin D order entered and she has other labs not completed for Dr. Loni Muse that need to be done: IBC ferritin and CBC

## 2020-10-30 ENCOUNTER — Other Ambulatory Visit: Payer: Self-pay

## 2020-10-30 DIAGNOSIS — K50818 Crohn's disease of both small and large intestine with other complication: Secondary | ICD-10-CM

## 2020-11-02 ENCOUNTER — Telehealth: Payer: Self-pay | Admitting: Gastroenterology

## 2020-11-02 ENCOUNTER — Inpatient Hospital Stay (HOSPITAL_COMMUNITY): Admission: RE | Admit: 2020-11-02 | Payer: Medicaid Other | Source: Ambulatory Visit

## 2020-11-02 NOTE — Telephone Encounter (Signed)
Lm on vm for patient to return call to clarify what she is rescheduling

## 2020-11-03 NOTE — Telephone Encounter (Signed)
Spoke with patient, she states that she was calling to let us know she is under the weather and could not come in for lab work at this time, she states that she will come bu . Advised that she does not need an appt for lab work. Patient verbalized understanding and had no other concerns at the end of the call.

## 2020-11-30 ENCOUNTER — Telehealth: Payer: Self-pay | Admitting: Gastroenterology

## 2020-11-30 ENCOUNTER — Ambulatory Visit (HOSPITAL_COMMUNITY)
Admission: RE | Admit: 2020-11-30 | Discharge: 2020-11-30 | Disposition: A | Discharge status: Home / Self Care | Payer: Medicaid Other | Source: Ambulatory Visit | Attending: Gastroenterology | Admitting: Gastroenterology

## 2020-11-30 ENCOUNTER — Encounter: Payer: Self-pay | Admitting: General Practice

## 2020-11-30 ENCOUNTER — Other Ambulatory Visit: Payer: Self-pay

## 2020-11-30 DIAGNOSIS — K50818 Crohn's disease of both small and large intestine with other complication: Secondary | ICD-10-CM | POA: Diagnosis not present

## 2020-11-30 MED ORDER — VEDOLIZUMAB 300 MG IV SOLR
300.0000 mg | Freq: Once | INTRAVENOUS | Status: AC
  Administered 2020-11-30: 300 mg via INTRAVENOUS
  Filled 2020-11-30: qty 5

## 2020-11-30 NOTE — Telephone Encounter (Signed)
Pt needed orders placed for future entyvio infusions so she could schedule further appts. Orders in and pt knows to call and schedule the appts.

## 2021-01-25 ENCOUNTER — Inpatient Hospital Stay (HOSPITAL_COMMUNITY): Admission: RE | Admit: 2021-01-25 | Payer: Medicaid Other | Source: Ambulatory Visit

## 2021-02-02 ENCOUNTER — Inpatient Hospital Stay (HOSPITAL_COMMUNITY): Admission: RE | Admit: 2021-02-02 | Payer: Medicaid Other | Source: Ambulatory Visit

## 2021-03-12 ENCOUNTER — Other Ambulatory Visit: Payer: Self-pay

## 2021-03-12 DIAGNOSIS — K50818 Crohn's disease of both small and large intestine with other complication: Secondary | ICD-10-CM

## 2021-03-15 ENCOUNTER — Ambulatory Visit (HOSPITAL_COMMUNITY)
Admission: RE | Admit: 2021-03-15 | Discharge: 2021-03-15 | Disposition: A | Discharge status: Home / Self Care | Payer: Medicaid Other | Source: Ambulatory Visit | Attending: Gastroenterology | Admitting: Gastroenterology

## 2021-03-15 ENCOUNTER — Other Ambulatory Visit: Payer: Self-pay

## 2021-03-15 DIAGNOSIS — K50818 Crohn's disease of both small and large intestine with other complication: Secondary | ICD-10-CM | POA: Diagnosis not present

## 2021-03-15 MED ORDER — VEDOLIZUMAB 300 MG IV SOLR
300.0000 mg | Freq: Once | INTRAVENOUS | Status: AC
  Administered 2021-03-15: 300 mg via INTRAVENOUS
  Filled 2021-03-15: qty 5

## 2021-03-16 ENCOUNTER — Other Ambulatory Visit: Payer: Self-pay

## 2021-03-16 DIAGNOSIS — K50818 Crohn's disease of both small and large intestine with other complication: Secondary | ICD-10-CM

## 2021-03-30 ENCOUNTER — Encounter (HOSPITAL_COMMUNITY): Payer: Medicaid Other

## 2021-05-10 ENCOUNTER — Inpatient Hospital Stay (HOSPITAL_COMMUNITY): Admission: RE | Admit: 2021-05-10 | Payer: Medicaid Other | Source: Ambulatory Visit

## 2021-05-13 DIAGNOSIS — H5213 Myopia, bilateral: Secondary | ICD-10-CM | POA: Diagnosis not present

## 2021-07-05 ENCOUNTER — Encounter (HOSPITAL_COMMUNITY): Payer: Medicaid Other

## 2021-07-20 ENCOUNTER — Other Ambulatory Visit: Payer: Self-pay

## 2021-07-20 ENCOUNTER — Telehealth: Payer: Self-pay

## 2021-07-20 ENCOUNTER — Ambulatory Visit (HOSPITAL_COMMUNITY)
Admission: RE | Admit: 2021-07-20 | Discharge: 2021-07-20 | Disposition: A | Discharge status: Home / Self Care | Payer: Medicaid Other | Source: Ambulatory Visit | Attending: Gastroenterology | Admitting: Gastroenterology

## 2021-07-20 DIAGNOSIS — K50818 Crohn's disease of both small and large intestine with other complication: Secondary | ICD-10-CM | POA: Diagnosis not present

## 2021-07-20 MED ORDER — VEDOLIZUMAB 300 MG IV SOLR
300.0000 mg | INTRAVENOUS | Status: DC
  Administered 2021-07-20: 300 mg via INTRAVENOUS
  Filled 2021-07-20: qty 5

## 2021-07-20 NOTE — Progress Notes (Signed)
Pt's last Quatiferon TB test was from 2/21. Office called. Will draw today and go ahead with Entyvio as ordered.

## 2021-07-20 NOTE — Telephone Encounter (Signed)
Received a call from Jessica [name] at the infusion center. Patient is there for Entyvio today but does not have a recent TB test. I have placed the order for a Quantiferon Gold and they will draw at the infusion center and will proceed with Entyvio infusion.

## 2021-07-24 LAB — QUANTIFERON-TB GOLD PLUS: QuantiFERON-TB Gold Plus: NEGATIVE

## 2021-07-24 LAB — QUANTIFERON-TB GOLD PLUS (RQFGPL)
QuantiFERON Mitogen Value: >10 IU/mL
QuantiFERON Nil Value: 0.01 IU/mL
QuantiFERON TB1 Ag Value: 0 IU/mL
QuantiFERON TB2 Ag Value: 0 IU/mL

## 2021-09-14 ENCOUNTER — Encounter (HOSPITAL_COMMUNITY): Payer: Medicaid Other

## 2021-09-28 ENCOUNTER — Other Ambulatory Visit (INDEPENDENT_AMBULATORY_CARE_PROVIDER_SITE_OTHER): Payer: Medicaid Other

## 2021-09-28 ENCOUNTER — Encounter: Payer: Self-pay | Admitting: Gastroenterology

## 2021-09-28 ENCOUNTER — Telehealth: Payer: Self-pay | Admitting: Gastroenterology

## 2021-09-28 ENCOUNTER — Ambulatory Visit: Payer: Medicaid Other | Admitting: Gastroenterology

## 2021-09-28 VITALS — BP 121/86 | HR 90 | Ht 66.0 in | Wt 212.0 lb

## 2021-09-28 DIAGNOSIS — Z8669 Personal history of other diseases of the nervous system and sense organs: Secondary | ICD-10-CM

## 2021-09-28 DIAGNOSIS — K50819 Crohn's disease of both small and large intestine with unspecified complications: Secondary | ICD-10-CM

## 2021-09-28 DIAGNOSIS — D509 Iron deficiency anemia, unspecified: Secondary | ICD-10-CM | POA: Diagnosis not present

## 2021-09-28 DIAGNOSIS — E559 Vitamin D deficiency, unspecified: Secondary | ICD-10-CM

## 2021-09-28 DIAGNOSIS — Z23 Encounter for immunization: Secondary | ICD-10-CM

## 2021-09-28 LAB — COMPREHENSIVE METABOLIC PANEL
ALT: 21 U/L (ref 0–35)
AST: 20 U/L (ref 0–37)
Albumin: 4.1 g/dL (ref 3.5–5.2)
Alkaline Phosphatase: 71 U/L (ref 39–117)
BUN: 9 mg/dL (ref 6–23)
CO2: 27 mEq/L (ref 19–32)
Calcium: 9.2 mg/dL (ref 8.4–10.5)
Chloride: 104 mEq/L (ref 96–112)
Creatinine, Ser: 0.64 mg/dL (ref 0.40–1.20)
GFR: 121.53 mL/min (ref >60.00)
Glucose, Bld: 91 mg/dL (ref 70–99)
Potassium: 3.9 mEq/L (ref 3.5–5.1)
Sodium: 137 mEq/L (ref 135–145)
Total Bilirubin: 0.4 mg/dL (ref 0.2–1.2)
Total Protein: 7.7 g/dL (ref 6.0–8.3)

## 2021-09-28 LAB — CBC WITH DIFFERENTIAL/PLATELET
Basophils Absolute: 0 10*3/uL (ref 0.0–0.1)
Basophils Relative: 0.5 % (ref 0.0–3.0)
Eosinophils Absolute: 0.1 10*3/uL (ref 0.0–0.7)
Eosinophils Relative: 1 % (ref 0.0–5.0)
HCT: 39.6 % (ref 36.0–46.0)
Hemoglobin: 12.5 g/dL (ref 12.0–15.0)
Lymphocytes Relative: 25.5 % (ref 12.0–46.0)
Lymphs Abs: 1.9 10*3/uL (ref 0.7–4.0)
MCHC: 31.7 g/dL (ref 30.0–36.0)
MCV: 86.6 fl (ref 78.0–100.0)
Monocytes Absolute: 0.7 10*3/uL (ref 0.1–1.0)
Monocytes Relative: 9.1 % (ref 3.0–12.0)
Neutro Abs: 4.6 10*3/uL (ref 1.4–7.7)
Neutrophils Relative %: 63.9 % (ref 43.0–77.0)
Platelets: 308 10*3/uL (ref 150.0–400.0)
RBC: 4.57 Mil/uL (ref 3.87–5.11)
RDW: 14.5 % (ref 11.5–15.5)
WBC: 7.3 10*3/uL (ref 4.0–10.5)

## 2021-09-28 LAB — IBC + FERRITIN
Ferritin: 6.1 ng/mL — ABNORMAL LOW (ref 10.0–291.0)
Iron: 30 ug/dL — ABNORMAL LOW (ref 42–145)
Saturation Ratios: 7 % — ABNORMAL LOW (ref 20.0–50.0)
TIBC: 427 ug/dL (ref 250.0–450.0)
Transferrin: 305 mg/dL (ref 212.0–360.0)

## 2021-09-28 LAB — VITAMIN D 25 HYDROXY (VIT D DEFICIENCY, FRACTURES): VITD: 12.06 ng/mL — ABNORMAL LOW (ref 30.00–100.00)

## 2021-09-28 LAB — VITAMIN B12: Vitamin B-12: 484 pg/mL (ref 211–911)

## 2021-09-28 MED ORDER — DICYCLOMINE HCL 10 MG PO CAPS: 10.0000 mg | ORAL_CAPSULE | Freq: Three times a day (TID) | ORAL | 3 refills | Status: DC | PRN

## 2021-09-28 NOTE — Telephone Encounter (Signed)
Inbound call from patient. States she was to let it known her next Entyvio appt is 10/26 @ 11.

## 2021-09-28 NOTE — Addendum Note (Signed)
Addended by: Roetta Sessions on: 09/28/2021 01:52 PM   Modules accepted: Orders

## 2021-09-28 NOTE — Progress Notes (Signed)
HPI :  Crohn's history History of Crohns diagnosed in 2012. Ileocolonic disease. She had a surgery with ileal resection with divertiing ileostomy at age 25 for emergency surgery, she thinks due to a perforation although details of this and indication for it are unclear. She was hospitalized for diversion colitis in 2015. Ileostomy takedown was done in 2016. She had a post-operative colonoscopy in February 2016 showing mildly active disease, and was placed on Humira in March 2016. She developed antibody on this regimen, Humira was increased and started on 6MP. The antibody level went away but Humira level remained low despite optimal dosing. Started on [facility] November 2018. Follow up colonoscopy in 2019 looked normal.     SINCE LAST VISIT: 25 year old female here for a follow-up visit.  I have not seen her since August 2021.  She has been maintained on Entyvio every 8 weeks since have last seen her.  She states since being on Entyvio she has not had any flares of her disease, over 4 years now.  She states about 1 month ago she was having visual disturbance/eye pain and was diagnosed with uveitis by her optometrist.  She was given steroid drops and this resolved.  About 3 weeks ago she states she started having loose stools with worsening abdominal discomfort.  Having roughly 4-5 loose bowel movements per day, without blood.  A lot of abdominal cramping with this.  No fevers.  No sick contacts.  No recent antibiotic use.  She has not been eating as well because it makes her use the bathroom.  She is due for her next Entyvio infusion in the next week or 2.  She denies any NSAIDs.  Since her last visit we placed her on supplemental iron for iron deficiency anemia as well as vitamin D for vitamin D deficiency.  She took these for a good bit of time but eventually ran out and stopped them.  She is not smoking any tobacco.  Recall that she has 3 children.  We had held her Entyvio at the end of her last pregnancy  and she had a mild flare when doing this that was treated with steroids and then since resuming her Weyman Rodney she has been doing well until recently.  She has historically declined the pneumonia vaccine and COVID-vaccine.  She is interested in getting a flu shot.  Last colonoscopy was in 2019 showed no active Crohn's disease.    Prior endoscopic evaluation: Colonoscopy 08/14/2018 - normal ileum, normal anastomosis in the ascending colon, no inflammation Colonoscopy 2/16 - patent anastomosis with some inflammatory changes, ileum with some ulcerations EGD 2/16 - normal   Healthcare maintenance: She has declined pneumovax / COVID 19, while flu shot UTD 2020 Vitamin D level of 13.23 on 07/21/20 TB testing negative 07/20/21  07/20/21 - Quant gold negative  Fecal calprotectin 07/21/2020 - normal (43)  Iron deficient 07/21/2020    Past Medical History:  Diagnosis Date   Anemia    Crohn's disease ([place])    Osteoporosis    Supervision of normal first pregnancy 05/03/2013   Uveitis    Vitamin D deficiency      Past Surgical History:  Procedure Laterality Date   COLONOSCOPY N/A 01/22/2015   Procedure: COLONOSCOPY;  Surgeon: [name], MD;  [facility];  Service: Endoscopy;  Laterality: N/A;   DILATION AND EVACUATION N/A 10/31/2014   Procedure: DILATATION AND EVACUATION;  Surgeon: Lahoma Crocker, MD;  Location: [place] [facility];  Service: Gynecology;  Laterality: N/A;  ESOPHAGOGASTRODUODENOSCOPY N/A 01/22/2015   Procedure: ESOPHAGOGASTRODUODENOSCOPY (EGD);  Surgeon: [name], MD;  Location: [facility];  Service: Endoscopy;  Laterality: N/A;   FLEXIBLE SIGMOIDOSCOPY N/A 11/04/2014   Procedure: FLEXIBLE SIGMOIDOSCOPY;  Surgeon: [name], MD;  Location: [facility] ENDOSCOPY;  Service: Endoscopy;  Laterality: N/A;   ILEOSTOMY CLOSURE N/A 12/18/2014   Procedure: LOOP ILEOSTOMY REVERSAL;  Surgeon: [name], MD;  Location: [facility];  Service: General;  Laterality: N/A;   INCISION AND DRAINAGE  Left    arm   LAPAROSCOPIC TUBAL LIGATION Bilateral 02/19/2020   Procedure: LAPAROSCOPIC TUBAL LIGATION;  Surgeon: Woodroe Mode, MD;  Location: [place];  Service: Gynecology;  Laterality: Bilateral;   SMALL INTESTINE SURGERY     WISDOM TOOTH EXTRACTION     Family History  Problem Relation Age of Onset   Diabetes Father    Diabetes Maternal Grandmother    Heart disease Maternal Grandmother    Hypertension Maternal Grandmother    Kidney disease Maternal Grandmother    Lung cancer Maternal Grandfather    Hyperlipidemia Maternal Grandfather    Stroke Neg Hx    Colon cancer Neg Hx    Social History   Tobacco Use   Smoking status: Never   Smokeless tobacco: Never   Tobacco comments:    occasional cigarette  Vaping Use   Vaping Use: Never used  Substance Use Topics   Alcohol use: No    Alcohol/week: 0.0 standard drinks   Drug use: No   Current Outpatient Medications  Medication Sig Dispense Refill   Vedolizumab (ENTYVIO IV) Inject 1 Dose into the vein every 8 (eight) weeks. infusion     No current facility-administered medications for this visit.   No Known Allergies   Review of Systems: All systems reviewed and negative except where noted in HPI.   Lab Results  Component Value Date   WBC 6.1 07/21/2020   HGB 11.9 (L) 07/21/2020   HCT 36.1 07/21/2020   MCV 82.7 07/21/2020   PLT 280.0 07/21/2020    Lab Results  Component Value Date   ALT 16 02/13/2020   AST 19 02/13/2020   ALKPHOS 65 02/13/2020   BILITOT 0.3 02/13/2020    Lab Results  Component Value Date   CREATININE 0.76 02/13/2020   BUN 13 02/13/2020   NA 137 02/13/2020   K 4.0 02/13/2020   CL 106 02/13/2020   CO2 22 02/13/2020     Physical Exam: BP 121/86   Pulse 90   Ht _0  (1.676 m)   Wt 212 lb (96.2 kg)   BMI 34.22 kg/m  Constitutional: Pleasant,well-developed, female in no acute distress. Abdominal: Soft, nondistended, nontender. There are no masses palpable.   Extremities: no edema Neurological: Alert and oriented to person place and time. Skin: Skin is warm and dry. No rashes noted. Psychiatric: Normal mood and affect. Behavior is normal.   ASSESSMENT AND PLAN: 25 year old female here for reassessment of following:  Crohn's disease Iron deficiency anemia B12 deficiency History of uveitis  As above, patient has been on standard dosing of Entyvio for the past few years and has done really well, last colonoscopy in 2019 showed she was in remission on this regimen.  Unfortunately developed uveitis about 1 month ago, improved with steroid drops.  Now with variety of symptoms to include abdominal cramping, loose stools, poor appetite over the past 3 weeks which have persisted.  Discussed differential diagnosis.  Concerning this may be a Crohn's flare however will  need to rule out C. difficile given she is at risk for this.  We will send C. difficile PCR stool test, also basic labs to include CBC, c-Met, iron studies, B12, vitamin D.  She is due for her next Entyvio in the next week or 2, I would like to check a trough level to ensure adequate dosing, also check for antibodies.  Given the symptoms and its been over 3 years since her last colonoscopy, I think a colonoscopy is reasonable to reassess disease prior to making a major change in her regimen, confirm active disease.  If she has C. difficile she will need this treated prior to colonoscopy.  Patient is in agreement with the plan.  We will give her some Bentyl today for abdominal cramping and loose stools.  If C. difficile is negative and her symptoms are worsening we will give her a course of steroids while we are awaiting colonoscopy.  Counseled on pneumococcal and COVID-vaccine which she declines.  She is amenable to flu shot today which was administered.  Plan: - lab today - CBC, TIBC / ferritin, B12 level, CMET, vitamin D - C diff PCR stool test - Entyvio trough level - Colonoscopy - Flu shot  today - Bentyl 77m every 8 hours PRN #30 RF1 - pending course, may give steroids if Cdiff negative  SJolly Mango MD [facility]

## 2021-09-28 NOTE — Patient Instructions (Addendum)
If you are age 27 or older, your body mass index should be between 23-30. Your Body mass index is 34.22 kg/m. If this is out of the aforementioned range listed, please consider follow up with your Primary Care Provider.  If you are age 64 or younger, your body mass index should be between 19-25. Your Body mass index is 34.22 kg/m. If this is out of the aformentioned range listed, please consider follow up with your Primary Care Provider.   __________________________________________________________  The [facility] providers would like to encourage you to use [facility] to communicate with providers for non-urgent requests or questions.  Due to long hold times on the telephone, sending your provider a message by [facility] may be a faster and more efficient way to get a response.  Please allow 48 business hours for a response.  Please remember that this is for non-urgent requests.   You have been scheduled for a colonoscopy. Please follow written instructions given to you at your visit today.  Please pick up your prep supplies at the pharmacy within the next 1-3 days. If you use inhalers (even only as needed), please bring them with you on the day of your procedure.  Please go to the lab in the basement of our building to have lab work done as you leave today. Hit "B" for basement when you get on the elevator.  When the doors open the lab is on your left.  We will call you with the results. Thank you.  Please go to the lab the morning of your next Entyvio infusion for trough level blood work.  We are giving you the flu shot today.  We have sent the following medications to your pharmacy for you to pick up at your convenience: Bentyl 10 mg: Take every 8 hours as needed  Thank you for entrusting me with your care and for choosing [employer], Dr. [place] Cellar

## 2021-09-29 LAB — CLOSTRIDIUM DIFFICILE TOXIN B, QUALITATIVE, REAL-TIME PCR: Toxigenic C. Difficile by PCR: NOT DETECTED

## 2021-10-12 ENCOUNTER — Other Ambulatory Visit (HOSPITAL_COMMUNITY): Payer: Self-pay | Admitting: *Deleted

## 2021-10-12 ENCOUNTER — Telehealth: Payer: Self-pay

## 2021-10-12 MED ORDER — SUTAB 1479-225-188 MG PO TABS: 1.0000 | ORAL_TABLET | ORAL | 0 refills | Status: DC

## 2021-10-12 NOTE — Telephone Encounter (Signed)
Thanks Jan.  Yes I was aware of her diagnosis of Uveitis. If that is what is being treated and causing her eye problem, and not an infection or anything else, then we need to continue Entyvio. I don't think this is a reaction from the drug, but it can be related to active colitis and if she is being under treated we need to know that. I strongly recommend she come in the for the lab draw to see where her Entyvio levels are trending. But unless her eye doctor thinks the Weyman Rodney is causing these problems (I think it would be very unusual for that to be the case), I would recommend she continue her Entyvio for now. I'm happy to chat with her optometrist if needed. Even if she does not go for her infusion tomorrow (hopefully she can), she still needs the labs drawn tomorrow so I know what to do with her dosing moving forward. Can you let her know? Thanks

## 2021-10-12 NOTE — Telephone Encounter (Signed)
Called and spoke to patient to remind her to have her Entyvio level drawn tomorrow morning before her next infusion tomorrow. Patient indicated that she has cancelled her infusion because she is having a reaction in her eyes she believes is from [place]. She has been given prednisone, prescription eye drops and Maxitrol ointment. She is starting those today and was told she needs to hold off on her infusion until advised by Dr. Havery Moros. Please advise. Note, Patient indicates she spoke to someone in our office yesterday and today but has not heard back.   Patient has colonoscopy scheduled for 11-8 and asked that instructions be resent to her MyChart.  Instructions resent. S[facility] sent to [facility] with codes.

## 2021-10-12 NOTE — Telephone Encounter (Signed)
-----   Message from Roetta Sessions, [place] sent at 09/28/2021  1:53 PM EDT ----- Regarding: remind pt to go to lab for Entyvio trough tomorrow am before infusion Patient is supposed to have next Entyvio infusion on 10-26 Wednesday at 46.  Lab is in for trough level and patient needs to go the morning of her infusion.

## 2021-10-12 NOTE — Telephone Encounter (Signed)
Called and spoke to patient. She said when she spoke to you about this and she thought you wanted her to let you know if she was continuing to have inflammation in her eyes bc it may be attributed to [facility]. I communicated  that it can be related to active colitis but that Entyvio should not be causing the problem. She confirmed it is uveitis and not a new "infection". She will go get the lab done tomorrow morning and will call to see if she can still get the infusion.

## 2021-10-13 ENCOUNTER — Ambulatory Visit (HOSPITAL_COMMUNITY): Payer: Medicaid Other

## 2021-10-26 ENCOUNTER — Telehealth: Payer: Self-pay | Admitting: Gastroenterology

## 2021-10-26 ENCOUNTER — Encounter: Payer: Medicaid Other | Admitting: Gastroenterology

## 2021-10-26 NOTE — Telephone Encounter (Signed)
OKay thanks for letting me know, sorry to hear about this

## 2021-11-09 ENCOUNTER — Encounter (HOSPITAL_COMMUNITY): Payer: Medicaid Other

## 2021-11-29 ENCOUNTER — Ambulatory Visit (HOSPITAL_COMMUNITY)
Admission: RE | Admit: 2021-11-29 | Discharge: 2021-11-29 | Disposition: A | Discharge status: Home / Self Care | Payer: Medicaid Other | Source: Ambulatory Visit | Attending: Gastroenterology | Admitting: Gastroenterology

## 2021-11-29 ENCOUNTER — Other Ambulatory Visit: Payer: Self-pay

## 2021-11-29 DIAGNOSIS — K50818 Crohn's disease of both small and large intestine with other complication: Secondary | ICD-10-CM | POA: Insufficient documentation

## 2021-11-29 MED ORDER — VEDOLIZUMAB 300 MG IV SOLR
300.0000 mg | INTRAVENOUS | Status: DC
  Administered 2021-11-29: 300 mg via INTRAVENOUS
  Filled 2021-11-29: qty 5

## 2021-12-31 ENCOUNTER — Encounter: Payer: Self-pay | Admitting: Gastroenterology

## 2021-12-31 ENCOUNTER — Telehealth: Payer: Self-pay

## 2021-12-31 DIAGNOSIS — D509 Iron deficiency anemia, unspecified: Secondary | ICD-10-CM

## 2021-12-31 DIAGNOSIS — E559 Vitamin D deficiency, unspecified: Secondary | ICD-10-CM

## 2021-12-31 NOTE — Telephone Encounter (Signed)
-----   Message from Yevette Edwards, RN sent at 09/30/2021  9:03 AM EDT ----- Regarding: Labs Repeat labs, need to enter orders: - IBC + Ferritin - Vitamin D

## 2021-12-31 NOTE — Telephone Encounter (Signed)
Pt returned call. She is aware that she needs to come by for lab work, no appt necessary.

## 2021-12-31 NOTE — Telephone Encounter (Signed)
Lm on vm for patient to return call.  Lab orders in epic.

## 2022-01-06 ENCOUNTER — Other Ambulatory Visit: Payer: Self-pay

## 2022-01-06 ENCOUNTER — Ambulatory Visit (AMBULATORY_SURGERY_CENTER): Payer: Medicaid Other | Admitting: *Deleted

## 2022-01-06 VITALS — Ht 66.0 in | Wt 215.0 lb

## 2022-01-06 DIAGNOSIS — K50819 Crohn's disease of both small and large intestine with unspecified complications: Secondary | ICD-10-CM

## 2022-01-06 NOTE — Progress Notes (Signed)

## 2022-01-21 ENCOUNTER — Telehealth: Payer: Self-pay

## 2022-01-21 DIAGNOSIS — E559 Vitamin D deficiency, unspecified: Secondary | ICD-10-CM

## 2022-01-21 DIAGNOSIS — D509 Iron deficiency anemia, unspecified: Secondary | ICD-10-CM

## 2022-01-21 DIAGNOSIS — K50819 Crohn's disease of both small and large intestine with unspecified complications: Secondary | ICD-10-CM

## 2022-01-21 NOTE — Telephone Encounter (Signed)
-----   Message from Algernon Huxley, RN sent at 12/31/2021 12:58 PM EST ----- Regarding: Entyvio level lab Pt needs order for entyvio entered prior to her infusion on 01/24/22

## 2022-01-21 NOTE — Telephone Encounter (Signed)
Spoke with patient to remind her that she will be due for [facility] labs on Monday morning prior to her infusion. She is aware that the orders for her iron panel and Vitamin D are still in the system. No appointment is necessary. Patient is aware that she can stop by the lab in the basement at her convenience on Monday prior to her Entyvio infusion. She is aware that the lab opens at 7:30 am. Patient verbalized understanding and had no concerns at the end of the call.

## 2022-01-24 ENCOUNTER — Other Ambulatory Visit (INDEPENDENT_AMBULATORY_CARE_PROVIDER_SITE_OTHER): Payer: Medicaid Other

## 2022-01-24 ENCOUNTER — Other Ambulatory Visit: Payer: Self-pay

## 2022-01-24 ENCOUNTER — Ambulatory Visit (HOSPITAL_COMMUNITY)
Admission: RE | Admit: 2022-01-24 | Discharge: 2022-01-24 | Disposition: A | Discharge status: Home / Self Care | Payer: Medicaid Other | Source: Ambulatory Visit | Attending: Gastroenterology | Admitting: Gastroenterology

## 2022-01-24 DIAGNOSIS — K50819 Crohn's disease of both small and large intestine with unspecified complications: Secondary | ICD-10-CM

## 2022-01-24 DIAGNOSIS — K50818 Crohn's disease of both small and large intestine with other complication: Secondary | ICD-10-CM | POA: Diagnosis not present

## 2022-01-24 DIAGNOSIS — D509 Iron deficiency anemia, unspecified: Secondary | ICD-10-CM

## 2022-01-24 DIAGNOSIS — E559 Vitamin D deficiency, unspecified: Secondary | ICD-10-CM

## 2022-01-24 LAB — VITAMIN D 25 HYDROXY (VIT D DEFICIENCY, FRACTURES): VITD: 8.88 ng/mL — ABNORMAL LOW (ref 30.00–100.00)

## 2022-01-24 LAB — IBC + FERRITIN
Ferritin: 6 ng/mL — ABNORMAL LOW (ref 10.0–291.0)
Iron: 29 ug/dL — ABNORMAL LOW (ref 42–145)
Saturation Ratios: 7.2 % — ABNORMAL LOW (ref 20.0–50.0)
TIBC: 401.8 ug/dL (ref 250.0–450.0)
Transferrin: 287 mg/dL (ref 212.0–360.0)

## 2022-01-24 MED ORDER — VEDOLIZUMAB 300 MG IV SOLR
300.0000 mg | INTRAVENOUS | Status: DC
  Administered 2022-01-24: 300 mg via INTRAVENOUS
  Filled 2022-01-24: qty 5

## 2022-01-24 NOTE — Telephone Encounter (Signed)
Secure staff message sent to [facility] for new authorization for [facility].

## 2022-01-24 NOTE — Telephone Encounter (Signed)
Pt arrive for labs today. Received a secure chat from Dorothea Glassman, RN stating "Juluis Rainier today is the last ordered dose we have for ms Erck's entyvio.  Her orders will expire after today and we will need new orders to be signed and held in epic.".   New Entyvio orders in epic.

## 2022-01-30 LAB — SERIAL MONITORING

## 2022-02-01 LAB — VEDOLIZUMAB AND ANTI-VEDO AB
Anti-Vedolizumab Antibody: <25 ng/mL
Vedolizumab: 3.9 ug/mL

## 2022-02-02 ENCOUNTER — Encounter: Payer: Self-pay | Admitting: Gastroenterology

## 2022-02-02 ENCOUNTER — Ambulatory Visit (AMBULATORY_SURGERY_CENTER): Payer: Medicaid Other | Admitting: Gastroenterology

## 2022-02-02 VITALS — BP 113/83 | HR 67 | Temp 97.5°F | Resp 18 | Ht 66.0 in | Wt 215.0 lb

## 2022-02-02 DIAGNOSIS — K6289 Other specified diseases of anus and rectum: Secondary | ICD-10-CM

## 2022-02-02 DIAGNOSIS — K50819 Crohn's disease of both small and large intestine with unspecified complications: Secondary | ICD-10-CM | POA: Diagnosis not present

## 2022-02-02 DIAGNOSIS — K633 Ulcer of intestine: Secondary | ICD-10-CM

## 2022-02-02 DIAGNOSIS — K5730 Diverticulosis of large intestine without perforation or abscess without bleeding: Secondary | ICD-10-CM | POA: Diagnosis not present

## 2022-02-02 MED ORDER — SODIUM CHLORIDE 0.9 % IV SOLN: 500.0000 mL | Freq: Once | INTRAVENOUS | Status: DC

## 2022-02-02 NOTE — Op Note (Signed)
[place] Patient Name: Jessica [name] Procedure Date: 02/02/2022 10:13 AM MRN: [id number] Endoscopist: Remo Lipps P. Havery Moros , MD Age: 26 Referring MD:  Date of Birth: [birth year] Gender: Female Account #: [id number] Procedure:                Colonoscopy Indications:              Follow-up of Crohn's disease - history of prior                            ileal / right colon resection, on Entyvio with some                            abdominal cramping / loose stools and history                            Uveitis recently. Entyvio level recently returned                            subtherapeutic Medicines:                Monitored Anesthesia Care Procedure:                Pre-Anesthesia Assessment:                           - Prior to the procedure, a History and Physical                            was performed, and patient medications and                            allergies were reviewed. The patient's tolerance of                            previous anesthesia was also reviewed. The risks                            and benefits of the procedure and the sedation                            options and risks were discussed with the patient.                            All questions were answered, and informed consent                            was obtained. Prior Anticoagulants: The patient has                            taken no previous anticoagulant or antiplatelet                            agents. ASA Grade Assessment: II - A patient with  mild systemic disease. After reviewing the risks                            and benefits, the patient was deemed in                            satisfactory condition to undergo the procedure.                           After obtaining informed consent, the colonoscope                            was passed under direct vision. Throughout the                            procedure, the patient's blood pressure,  pulse, and                            oxygen saturations were monitored continuously. The                            Olympus PCF-H190DL ([id number]) Colonoscope was                            introduced through the anus and advanced to the the                            terminal ileum. The colonoscopy was performed                            without difficulty. The patient tolerated the                            procedure well. The quality of the bowel                            preparation was good. The terminal ileum, surgical                            anastomosis, and the rectum were photographed. Scope In: 10:19:41 AM Scope Out: 10:30:38 AM Scope Withdrawal Time: 0 hours 8 minutes 23 seconds  Total Procedure Duration: 0 hours 10 minutes 57 seconds  Findings:                 The perianal and digital rectal examinations were                            normal.                           The mid ileum contained a single (solitary) ulcer,                            near what appeared to be a prior surgical  anastomosis in the small bowel                           The remainder of the exam in the terminal ileum was                            normal. The ileum was deeply intubated to                            eventually see the ulcer noted proximally. No other                            inflammatory changes noted.                           There was evidence of a prior end-to-end                            ileo-colonic anastomosis in the ascending colon.                            This was patent and was characterized by healthy                            appearing mucosa.                           A single small-mouthed diverticulum was found in                            the ascending colon.                           Anal papilla(e) were hypertrophied.                           The exam was otherwise without abnormality. Complications:            No immediate  complications. Estimated blood loss:                            None. Estimated Blood Loss:     Estimated blood loss: none. Impression:               - A single (solitary) ulcer in the mid ileum at                            what appeared to be a surgical anastomosis in the                            more proximal small bowel. Unclear if this is                            anastomotic ulcer or due to Crohn's disease as  otherwise there were no other inflammatory changes                           - Patent end-to-end ileo-colonic anastomosis,                            characterized by healthy appearing mucosa.                           - Diverticulosis in the ascending colon.                           - Anal papilla(e) were hypertrophied.                           - The examination was otherwise normal.                           Overall, only one focal ulceration as noted above                            but patient with iron deficiency and some symptoms.                            Would recommend dose escalation of Entyvio to every                            4 week dosing and monitor clinical response,                            consider for MRE and fecal calprotectin for                            monitoring after change in dosing. Will discuss                            with the patient. Recommendation:           - Patient has a contact number available for                            emergencies. The signs and symptoms of potential                            delayed complications were discussed with the                            patient. Return to normal activities tomorrow.                            Written discharge instructions were provided to the                            patient.                           -  Resume previous diet.                           - Continue present medications.                           - Start ferrous sulfate 353m / day OTC  and vitamin                            D3 5000 IU / day if have not already started                           - Increase Entyvio to every 4 week dosing with                            additional recommendations as above                           - Follow up in the office in 3 months Lorilyn Laitinen P. Cathy Crounse, MD 02/02/2022 10:40:47 AM This report has been signed electronically.

## 2022-02-02 NOTE — Progress Notes (Signed)
A and O x3. Report to RN. Tolerated MAC anesthesia well.

## 2022-02-02 NOTE — Progress Notes (Signed)
Pt's states no medical or surgical changes since previsit or office visit. 

## 2022-02-02 NOTE — Progress Notes (Signed)
[facility] History and Physical   Primary Care Physician:  Patient, No Pcp Per (Inactive)   Reason for Procedure:   Crohn's disease  Plan:    colonoscopy     HPI: Jessica [name] is a 26 y.o. female  here for colonoscopy surveillance - history of Crohn's disease on Entyvio. Had been doing well, some symptoms recently of bowel changes. Entyvio levels just returned low.  Otherwise feels well without any cardiopulmonary symptoms.    Past Medical History:  Diagnosis Date   Anemia    Crohn's disease ([place])    Osteoporosis    Supervision of normal first pregnancy 05/03/2013   Uveitis    Vitamin D deficiency     Past Surgical History:  Procedure Laterality Date   COLONOSCOPY N/A 01/22/2015   Procedure: COLONOSCOPY;  Surgeon: [name], MD;  Location: [facility];  Service: Endoscopy;  Laterality: N/A;   DILATION AND EVACUATION N/A 10/31/2014   Procedure: DILATATION AND EVACUATION;  Surgeon: Lahoma Crocker, MD;  Location: [facility];  Service: Gynecology;  Laterality: N/A;   ESOPHAGOGASTRODUODENOSCOPY N/A 01/22/2015   Procedure: ESOPHAGOGASTRODUODENOSCOPY (EGD);  Surgeon: [name], MD;  Location: [facility];  Service: Endoscopy;  Laterality: N/A;   FLEXIBLE SIGMOIDOSCOPY N/A 11/04/2014   Procedure: FLEXIBLE SIGMOIDOSCOPY;  Surgeon: [name], MD;  Location[facility];  Service: Endoscopy;  Laterality: N/A;   ILEOSTOMY CLOSURE N/A 12/18/2014   Procedure: LOOP ILEOSTOMY REVERSAL;  Surgeon: [name], MD;  Location: [facility];  Service: General;  Laterality: N/A;   INCISION AND DRAINAGE Left    arm   LAPAROSCOPIC TUBAL LIGATION Bilateral 02/19/2020   Procedure: LAPAROSCOPIC TUBAL LIGATION;  Surgeon: Woodroe Mode, MD;  Location: [facility];  Service: Gynecology;  Laterality: Bilateral;   SMALL INTESTINE SURGERY     WISDOM TOOTH EXTRACTION      Prior to Admission medications   Medication Sig Start Date End Date Taking? Authorizing  Provider  Sodium Sulfate-Mag Sulfate-KCl (SUTAB) [phone] MG TABS Take 1 kit by mouth as directed. BIN: [id number] PCN: CN GROUP: [id number] MEMBER ID: [id number]; RUN AS CASH 10/12/21   Kaylum Shrum, Carlota Raspberry, MD  Vedolizumab (ENTYVIO IV) Inject 1 Dose into the vein every 8 (eight) weeks. infusion    [provider]    Current Outpatient Medications  Medication Sig Dispense Refill   Sodium Sulfate-Mag Sulfate-KCl (SUTAB) [phone] MG TABS Take 1 kit by mouth as directed. BIN: [id number] PCN: CN GROUP: [id number] MEMBER ID: [id number]; RUN AS CASH 24 tablet 0   Vedolizumab (ENTYVIO IV) Inject 1 Dose into the vein every 8 (eight) weeks. infusion     Current Facility-Administered Medications  Medication Dose Route Frequency Provider Last Rate Last Admin   0.9 %  sodium chloride infusion  500 mL Intravenous Once Cornelis Kluver, Carlota Raspberry, MD        Allergies as of 02/02/2022   (No Known Allergies)    Family History  Problem Relation Age of Onset   Diabetes Father    Diabetes Maternal Grandmother    Heart disease Maternal Grandmother    Hypertension Maternal Grandmother    Kidney disease Maternal Grandmother    Lung cancer Maternal Grandfather    Hyperlipidemia Maternal Grandfather    Stroke Neg Hx    Colon cancer Neg Hx    Colon polyps Neg Hx    Esophageal cancer Neg Hx    Rectal cancer Neg Hx    Stomach cancer Neg Hx     Social  History   Socioeconomic History   Marital status: Single    Spouse name: Not on file   Number of children: 1   Years of education: Not on file   Highest education level: Not on file  Occupational History   Occupation: homemaker  Tobacco Use   Smoking status: Never   Smokeless tobacco: Never   Tobacco comments:    occasional cigarette  Vaping Use   Vaping Use: Never used  Substance and Sexual Activity   Alcohol use: No    Alcohol/week: 0.0 standard drinks   Drug use: No   Sexual activity: Yes    Partners: Male  Other Topics Concern    Not on file  Social History Narrative   Not on file   Social Determinants of Health   Financial Resource Strain: Not on file  Food Insecurity: Not on file  Transportation Needs: Not on file  Physical Activity: Not on file  Stress: Not on file  Social Connections: Not on file  Intimate Partner Violence: Not on file    Review of Systems: All other review of systems negative except as mentioned in the HPI.  Physical Exam: Vital signs BP (!) 130/92    Pulse 84    Temp (!) 97.5 F (36.4 C) (Skin)    Resp 17    Ht 5' 6"  (1.676 m)    Wt 215 lb (97.5 kg)    SpO2 100%    BMI 34.70 kg/m   General:   Alert,  Well-developed, pleasant and cooperative in NAD Lungs:  Clear throughout to auscultation.   Heart:  Regular rate and rhythm Abdomen:  Soft, nontender and nondistended.   Neuro/Psych:  Alert and cooperative. Normal mood and affect. A and O x 3  Jolly Mango, MD [facility]

## 2022-02-02 NOTE — Patient Instructions (Signed)
Please read handouts provided. Continue present medications. Start Ferrous Sulfate 346m everyday OTC and Vitamin D3 5000 IU everyday. Follow up in office in 3 months. Increase Entyvio to every 4 week dosing.   [facility]   Refer to the procedure report that was given to you for any specific questions about what was found during the examination.  If the procedure report does not answer your questions, please call your gastroenterologist to clarify.  If you requested that your care partner not be given the details of your procedure findings, then the procedure report has been included in a sealed envelope for you to review at your convenience later.  YOU SHOULD EXPECT: Some feelings of bloating in the abdomen. Passage of more gas than usual.  Walking can help get rid of the air that was put into your GI tract during the procedure and reduce the bloating. If you had a lower endoscopy (such as a colonoscopy or flexible sigmoidoscopy) you may notice spotting of blood in your stool or on the toilet paper. If you underwent a bowel prep for your procedure, you may not have a normal bowel movement for a few days.  Please Note:  You might notice some irritation and congestion in your nose or some drainage.  This is from the oxygen used during your procedure.  There is no need for concern and it should clear up in a day or so.  SYMPTOMS TO REPORT IMMEDIATELY:  Following lower endoscopy (colonoscopy or flexible sigmoidoscopy):  Excessive amounts of blood in the stool  Significant tenderness or worsening of abdominal pains  Swelling of the abdomen that is new, acute  Fever of 100F or higher   For urgent or emergent issues, a gastroenterologist can be reached at any hour by calling [phone] Do not use MyChart messaging for urgent concerns.    DIET:  We do recommend a small meal at first, but then you may proceed to your regular diet.   Drink plenty of fluids but you should avoid alcoholic beverages for 24 hours.  ACTIVITY:  You should plan to take it easy for the rest of today and you should NOT DRIVE or use heavy machinery until tomorrow (because of the sedation medicines used during the test).    FOLLOW UP: Our staff will call the number listed on your records 48-72 hours following your procedure to check on you and address any questions or concerns that you may have regarding the information given to you following your procedure. If we do not reach you, we will leave a message.  We will attempt to reach you two times.  During this call, we will ask if you have developed any symptoms of COVID 19. If you develop any symptoms (ie: fever, flu-like symptoms, shortness of breath, cough etc.) before then, please call [phone]  If you test positive for Covid 19 in the 2 weeks post procedure, please call and report this information to [place]    If any biopsies were taken you will be contacted by phone or by letter within the next 1-3 weeks.  Please call [place] ([phone] you have not heard about the biopsies in 3 weeks.    SIGNATURES/CONFIDENTIALITY: You and/or your care partner have signed paperwork which will be entered into your electronic medical record.  These signatures attest to the fact that that the information above on your After Visit Summary has been reviewed and is understood.  Full responsibility of the confidentiality of this discharge information lies with you and/or your care-partner.

## 2022-02-03 ENCOUNTER — Other Ambulatory Visit: Payer: Self-pay

## 2022-02-03 ENCOUNTER — Telehealth: Payer: Self-pay

## 2022-02-03 DIAGNOSIS — K50819 Crohn's disease of both small and large intestine with unspecified complications: Secondary | ICD-10-CM

## 2022-02-03 MED ORDER — VITAMIN D3 125 MCG (5000 UT) PO CAPS
1.0000 | ORAL_CAPSULE | Freq: Every day | ORAL | 0 refills | Status: DC
Start: 2022-02-03 — End: 2024-05-09

## 2022-02-03 MED ORDER — FERROUS SULFATE 325 (65 FE) MG PO TBEC: 325.0000 mg | DELAYED_RELEASE_TABLET | Freq: Every day | ORAL | 0 refills | Status: AC

## 2022-02-03 NOTE — Telephone Encounter (Signed)
Per Amy Hazelwood, no auth needed with patient's Medicaid insurance. I attempted to reach the [facility] about change in therapy plan. I left them a detailed vm with new order and asked that they give me a call back to discuss. I left my direct phone number.

## 2022-02-03 NOTE — Telephone Encounter (Signed)
Per 02/02/22 procedure report: - Start Ferrous sulfate 325 mg daily OTC and Vitamin D3 5000 IU daily - Increase Entyvio to every 4 weeks dosing with additional recommendations as above - Follow up in the office in 3 months  22-monthoffice recall in epic. [facility] Ambulatory referral to GI in epic for Entyvio frequency change. [facility]ched[facility].

## 2022-02-04 ENCOUNTER — Telehealth: Payer: Self-pay | Admitting: *Deleted

## 2022-02-04 ENCOUNTER — Telehealth: Payer: Self-pay

## 2022-02-04 ENCOUNTER — Other Ambulatory Visit (HOSPITAL_COMMUNITY): Payer: Self-pay | Admitting: *Deleted

## 2022-02-04 NOTE — Telephone Encounter (Signed)
°  Follow up Call-  Call back number 02/02/2022  Post procedure Call Back phone  # [phone]  Permission to leave phone message Yes  Some recent data might be hidden     Patient questions:  Do you have a fever, pain , or abdominal swelling? No. Pain Score  0 *  Have you tolerated food without any problems? Yes.    Have you been able to return to your normal activities? Yes.    Do you have any questions about your discharge instructions: Diet   No. Medications  No. Follow up visit  No.  Do you have questions or concerns about your Care? No.  Actions: * If pain score is 4 or above: No action needed, pain <4.

## 2022-02-04 NOTE — Telephone Encounter (Signed)
Left message on follow up call. 

## 2022-02-08 MED ORDER — VEDOLIZUMAB 300 MG IV SOLR: 300.0000 mg | INTRAVENOUS | Status: AC

## 2022-02-08 NOTE — Telephone Encounter (Signed)
Jessica [name] has been approved. Pt is scheduled for her next Entyvio infusion on Monday, 02/21/22 at 10 am.

## 2022-02-21 ENCOUNTER — Ambulatory Visit (HOSPITAL_COMMUNITY)
Admission: RE | Admit: 2022-02-21 | Discharge: 2022-02-21 | Disposition: A | Discharge status: Home / Self Care | Payer: Medicaid Other | Source: Ambulatory Visit | Attending: Gastroenterology | Admitting: Gastroenterology

## 2022-02-21 ENCOUNTER — Other Ambulatory Visit: Payer: Self-pay

## 2022-02-21 DIAGNOSIS — K50819 Crohn's disease of both small and large intestine with unspecified complications: Secondary | ICD-10-CM | POA: Insufficient documentation

## 2022-02-21 MED ORDER — VEDOLIZUMAB 300 MG IV SOLR
300.0000 mg | INTRAVENOUS | Status: DC
  Administered 2022-02-21: 300 mg via INTRAVENOUS
  Filled 2022-02-21: qty 5

## 2022-03-07 ENCOUNTER — Encounter: Payer: Self-pay | Admitting: Gastroenterology

## 2022-03-07 DIAGNOSIS — R197 Diarrhea, unspecified: Secondary | ICD-10-CM

## 2022-03-07 DIAGNOSIS — K50819 Crohn's disease of both small and large intestine with unspecified complications: Secondary | ICD-10-CM

## 2022-03-07 MED ORDER — DICYCLOMINE HCL 10 MG PO CAPS: 10.0000 mg | ORAL_CAPSULE | Freq: Three times a day (TID) | ORAL | 1 refills | Status: DC | PRN

## 2022-03-07 NOTE — Telephone Encounter (Signed)
Jessica [name] she should now be on Entyvio every 4 weeks if you can confirm. She had a colonoscopy last month which looked okay. She should submit a stool sample for C Diff PCR If you can order that and coordinate. Otherwise, once C diff is negative she can take immodium PRN. We can give some bentyl 52m every 8 hours in the interim to see if that will help. Thanks  ? ?Patient is receiving Entyvio every 4 weeks. Patient notified of recommendations via my chart. RX for Bentyl sent to pharmacy on file. Stool study order in epic. ?

## 2022-03-21 ENCOUNTER — Encounter (HOSPITAL_COMMUNITY): Payer: Medicaid Other

## 2022-04-12 ENCOUNTER — Ambulatory Visit (HOSPITAL_COMMUNITY)
Admission: RE | Admit: 2022-04-12 | Discharge: 2022-04-12 | Disposition: A | Discharge status: Home / Self Care | Payer: Medicaid Other | Source: Ambulatory Visit | Attending: Gastroenterology | Admitting: Gastroenterology

## 2022-04-12 DIAGNOSIS — K50819 Crohn's disease of both small and large intestine with unspecified complications: Secondary | ICD-10-CM | POA: Diagnosis not present

## 2022-04-12 MED ORDER — VEDOLIZUMAB 300 MG IV SOLR
300.0000 mg | INTRAVENOUS | Status: DC
  Administered 2022-04-12: 300 mg via INTRAVENOUS
  Filled 2022-04-12: qty 5

## 2022-04-18 ENCOUNTER — Encounter (HOSPITAL_COMMUNITY): Payer: Medicaid Other

## 2022-05-10 ENCOUNTER — Encounter (HOSPITAL_COMMUNITY): Payer: Medicaid Other

## 2022-05-22 ENCOUNTER — Other Ambulatory Visit: Payer: Self-pay

## 2022-05-22 NOTE — Progress Notes (Signed)
Update med list

## 2022-05-30 ENCOUNTER — Ambulatory Visit: Payer: Medicaid Other | Admitting: Gastroenterology

## 2022-06-27 ENCOUNTER — Encounter (HOSPITAL_COMMUNITY): Payer: Medicaid Other

## 2022-07-06 ENCOUNTER — Ambulatory Visit (HOSPITAL_COMMUNITY)
Admission: RE | Admit: 2022-07-06 | Discharge: 2022-07-06 | Disposition: A | Discharge status: Home / Self Care | Payer: Medicaid Other | Source: Ambulatory Visit | Attending: Gastroenterology | Admitting: Gastroenterology

## 2022-07-06 DIAGNOSIS — K50819 Crohn's disease of both small and large intestine with unspecified complications: Secondary | ICD-10-CM | POA: Diagnosis not present

## 2022-07-06 MED ORDER — VEDOLIZUMAB 300 MG IV SOLR
300.0000 mg | INTRAVENOUS | Status: DC
  Administered 2022-07-06: 300 mg via INTRAVENOUS
  Filled 2022-07-06: qty 5

## 2022-07-11 ENCOUNTER — Encounter: Payer: Self-pay | Admitting: Gastroenterology

## 2022-07-15 ENCOUNTER — Ambulatory Visit: Payer: Medicaid Other | Admitting: Gastroenterology

## 2022-07-25 ENCOUNTER — Encounter (HOSPITAL_COMMUNITY): Payer: Medicaid Other

## 2022-08-03 ENCOUNTER — Encounter (HOSPITAL_COMMUNITY): Payer: Medicaid Other

## 2022-08-17 ENCOUNTER — Inpatient Hospital Stay (HOSPITAL_COMMUNITY): Admission: RE | Admit: 2022-08-17 | Payer: Medicaid Other | Source: Ambulatory Visit

## 2022-08-31 ENCOUNTER — Encounter (HOSPITAL_COMMUNITY): Payer: Medicaid Other

## 2022-09-13 ENCOUNTER — Encounter: Payer: Self-pay | Admitting: Gastroenterology

## 2022-09-13 ENCOUNTER — Telehealth (INDEPENDENT_AMBULATORY_CARE_PROVIDER_SITE_OTHER): Payer: Medicaid Other | Admitting: Gastroenterology

## 2022-09-13 DIAGNOSIS — G43809 Other migraine, not intractable, without status migrainosus: Secondary | ICD-10-CM

## 2022-09-13 DIAGNOSIS — K5080 Crohn's disease of both small and large intestine without complications: Secondary | ICD-10-CM | POA: Diagnosis not present

## 2022-09-13 DIAGNOSIS — D509 Iron deficiency anemia, unspecified: Secondary | ICD-10-CM

## 2022-09-13 DIAGNOSIS — E559 Vitamin D deficiency, unspecified: Secondary | ICD-10-CM

## 2022-09-13 MED ORDER — SUMATRIPTAN SUCCINATE 50 MG PO TABS: ORAL_TABLET | ORAL | 1 refills | Status: DC

## 2022-09-13 NOTE — Patient Instructions (Addendum)
If you are age 28 or older, your body mass index should be between 23-30. Your There is no height or weight on file to calculate BMI. If this is out of the aforementioned range listed, please consider follow up with your Primary Care Provider.  If you are age 60 or younger, your body mass index should be between 19-25. Your There is no height or weight on file to calculate BMI. If this is out of the aformentioned range listed, please consider follow up with your Primary Care Provider.   ________________________________________________________  Please go to the lab in the basement of our building to have lab work done as you leave today. Hit "B" for basement when you get on the elevator.  When the doors open the lab is on your left.  We will call you with the results. Thank you.  Please Entyvio.  We will schedule you for an MR Enterography of the Abdomen and Pelvis in 3 months. We will call you with an appointment.  We have sent the following medications to your pharmacy for you to pick up at your convenience: Imitrex 50 mg: Take 1 dose at the onset of symptoms.   We are referring [facility] to [facility].  They will contact you directly to schedule an appointment.  It may take a week or more before you hear from them.  Please feel free to contact us if you have not heard from them within 2 weeks and we will follow up on the referral.    _______________________________________________________  If you are age 31 or older, your body mass index should be between 23-30. Your There is no height or weight on file to calculate BMI. If this is out of the aforementioned range listed, please consider follow up with your Primary Care Provider.  If you are age 75 or younger, your body mass index should be between 19-25. Your There is no height or weight on file to calculate BMI. If this is out of the aformentioned range listed, please consider follow up with your Primary Care Provider.    ________________________________________________________  The [facility] providers would like to encourage you to use [facility] to communicate with providers for non-urgent requests or questions.  Due to long hold times on the telephone, sending your provider a message by [facility] may be a faster and more efficient way to get a response.  Please allow 48 business hours for a response.  Please remember that this is for non-urgent requests.  _______________________________________________________  Thank you for entrusting me with your care and for choosing [facility], Dr. [place] Cellar

## 2022-09-13 NOTE — Progress Notes (Signed)
Virtual Visit via Video Note  I connected with Jessica [name] on 09/13/22 at  9:00 AM EDT by a video enabled telemedicine application and verified that I am speaking with the correct person using two identifiers.  Location: Patient: home Provider: office   I discussed the limitations of evaluation and management by telemedicine and the availability of in person appointments. The patient expressed understanding and agreed to proceed. The patient was advised to call back or seek an in-person evaluation if the symptoms worsen or if the condition fails to improve as anticipated.   HPI :  Crohn's history History of Crohns diagnosed in 2012. Ileocolonic disease. She had a surgery with ileal resection with divertiing ileostomy at age 18 for emergency surgery, she thinks due to a perforation although details of this and indication for it are unclear. She was hospitalized for diversion colitis in 2015. Ileostomy takedown was done in 2016. She had a post-operative colonoscopy in February 2016 showing mildly active disease, and was placed on Humira in March 2016. She developed antibody on this regimen, Humira was increased and started on 6MP. The antibody level went away but Humira level remained low despite optimal dosing. Started on [facility] November 2018. Follow up colonoscopy in 2019 looked normal. Subtherapeutic Entyvio levels 2023, increased to once monthly dosing.     SINCE LAST VISIT: 26 year old female here for follow-up visit today.  I am connecting with her virtually due to issues with childcare and that she has a cold today.  Recall that back in February she had subtherapeutic Entyvio level of 3 with no antibodies.  Her colonoscopy showed single ulcer at surgical anastomosis I felt more consistent with an anastomotic ulcer rather than active Crohn's disease.  The rest of her small intestine and colon were normal.  We increased her Entyvio level to once every 4 weeks and she states that she  felt really well on the regimen and felt that her Crohn's was well controlled.  She has missed her last 2 doses due to illness/sick contacts in her home.  Her child was hospitalized with an E. coli enteritis, she also felt that she had a viral gastroenteritis at 1 point time and so she is overdue for her next infusion.  She has a cold the right now but otherwise feels well.  Her weights been stable.  She has had some occasional pains in her abdomen but not severe.  Bentyl helps relieve this.  She had at 1 point some constipation that was bothering her but that has since resolved and more so occasional loose stools at this point time.  She appears to tolerate the Entyvio well although continues to have some migraine headaches with intermittent frequency.  Recall I put her on some Elavil years ago for migraine headaches but this made her a bit drowsy and had a hard time tolerating it during the day.  She has not seen a primary care in some time and has no primary care currently.  We discussed her options for this.  She has never been on a triptan before.  She is not using any NSAIDs.  Otherwise generally feeling okay.  She historically declined the pneumonia vaccine and COVID-vaccine.  She has gotten flu shots. Last colonoscopy as below.  She does have a history of iron deficiency and vitamin D deficiency, taking both of these supplements currently.     Prior endoscopic evaluation: Colonoscopy 08/14/2018 - normal ileum, normal anastomosis in the ascending colon, no inflammation Colonoscopy 2/16 -  patent anastomosis with some inflammatory changes, ileum with some ulcerations EGD 2/16 - normal  Fecal calprotectin 07/21/2020 - normal (43)   Iron deficient 07/21/2020  Entyvio level 3.9, no antibodies on 01/24/22  Colonoscopy 02/02/22: Follow-up of Crohn's disease - history of prior ileal / right colon resection, on Entyvio with some abdominal cramping / loose stools and history Uveitis recently. Entyvio level  recently returned subtherapeutic  The perianal and digital rectal examinations were normal. - The mid ileum contained a single (solitary) ulcer, near what appeared to be a prior surgical anastomosis in the small bowel - The remainder of the exam in the terminal ileum was normal. The ileum was deeply intubated to eventually see the ulcer noted proximally. No other inflammatory changes noted. - There was evidence of a prior end-to-end ileo-colonic anastomosis in the ascending colon. This was patent and was characterized by healthy appearing mucosa. - A single small-mouthed diverticulum was found in the ascending colon. - Anal papilla(e) were hypertrophied. - The exam was otherwise without abnormality.   Past Medical History:  Diagnosis Date   Anemia    Crohn's disease ([place])    Osteoporosis    Supervision of normal first pregnancy 05/03/2013   Uveitis    Vitamin D deficiency      Past Surgical History:  Procedure Laterality Date   COLONOSCOPY N/A 01/22/2015   Procedure: COLONOSCOPY;  Surgeon: [name], MD;  Location: [facility];  Service: Endoscopy;  Laterality: N/A;   DILATION AND EVACUATION N/A 10/31/2014   Procedure: DILATATION AND EVACUATION;  Surgeon: Lahoma Crocker, MD;  Location: [facility];  Service: Gynecology;  Laterality: N/A;   ESOPHAGOGASTRODUODENOSCOPY N/A 01/22/2015   Procedure: ESOPHAGOGASTRODUODENOSCOPY (EGD);  Surgeon: [name], MD;  Location: [facility];  Service: Endoscopy;  Laterality: N/A;   FLEXIBLE SIGMOIDOSCOPY N/A 11/04/2014   Procedure: FLEXIBLE SIGMOIDOSCOPY;  Surgeon: [name], MD;  Location: [facility] END[facility]COPY;  Service: Endoscopy;  Laterality: N/A;   ILEOSTOMY CLOSURE N/A 12/18/2014   Procedure: LOOP ILEOSTOMY REVERSAL;  Surgeon: [name], MD;  Location: [facility];  Service: General;  Laterality: N/A;   INCISION AND DRAINAGE Left    arm   LAPAROSCOPIC TUBAL LIGATION Bilateral 02/19/2020   Procedure: LAPAROSCOPIC TUBAL LIGATION;  Surgeon:  Woodroe Mode, MD;  Location: [place];  Service: Gynecology;  Laterality: Bilateral;   SMALL INTESTINE SURGERY     WISDOM TOOTH EXTRACTION     Family History  Problem Relation Age of Onset   Diabetes Father    Diabetes Maternal Grandmother    Heart disease Maternal Grandmother    Hypertension Maternal Grandmother    Kidney disease Maternal Grandmother    Lung cancer Maternal Grandfather    Hyperlipidemia Maternal Grandfather    Stroke Neg Hx    Colon cancer Neg Hx    Colon polyps Neg Hx    Esophageal cancer Neg Hx    Rectal cancer Neg Hx    Stomach cancer Neg Hx    Social History   Tobacco Use   Smoking status: Never   Smokeless tobacco: Never   Tobacco comments:    occasional cigarette  Vaping Use   Vaping Use: Never used  Substance Use Topics   Alcohol use: No    Alcohol/week: 0.0 standard drinks of alcohol   Drug use: No   Current Outpatient Medications  Medication Sig Dispense Refill   Cholecalciferol (VITAMIN D3) 125 MCG (5000 UT) CAPS Take 1 capsule (5,000 Units total) by mouth daily. [place]  capsule 0   dicyclomine (BENTYL) 10 MG capsule Take 1 capsule (10 mg total) by mouth every 8 (eight) hours as needed (diarrhea/abdominal cramping). 30 capsule 1   ferrous sulfate 325 (65 FE) MG EC tablet Take 1 tablet (325 mg total) by mouth daily with breakfast. 30 tablet 0   sodium chloride 0.9 % SOLN 250 mL with vedolizumab 300 MG SOLR 300 mg Inject 300 mg into the vein every 28 (twenty-eight) days. Entyvio every 4 weeks at [facility] infusion center.     No current facility-administered medications for this visit.   No Known Allergies   Review of Systems: All systems reviewed and negative except where noted in HPI.   Lab Results  Component Value Date   WBC 7.3 09/28/2021   HGB 12.5 09/28/2021   HCT 39.6 09/28/2021   MCV 86.6 09/28/2021   PLT 308.0 09/28/2021    Lab Results  Component Value Date   CREATININE 0.64 09/28/2021   BUN 9 09/28/2021   NA 137  09/28/2021   K 3.9 09/28/2021   CL 104 09/28/2021   CO2 27 09/28/2021    Lab Results  Component Value Date   ALT 21 09/28/2021   AST 20 09/28/2021   ALKPHOS 71 09/28/2021   BILITOT 0.4 09/28/2021     Physical Exam: There were no vitals taken for this visit. Constitutional: Pleasant,well-developed, female in no acute distress.  ASSESSMENT: 26 y.o. female here for assessment of the following  1. Crohn's disease of both small and large intestine without complication ([place])   2. Iron deficiency anemia, unspecified iron deficiency anemia type   3. Vitamin D deficiency   4. Other migraine without status migrainosus, not intractable    As above, Entyvio increased every 4 weeks after her levels were subtherapeutic, she has been doing pretty well and mostly asymptomatic until she has missed her last 2 doses due to personal illness and illness of her child.  She is hoping to get this scheduled again soon.  We discussed her options for Crohn's treatment and [facility] Rodney is the least immunosuppressive.  She is otherwise feeling well at this time outside of a cold, she should go ahead and reschedule her Entyvio.  She is due for blood work at this time, we will bring her in for CBC, iron studies, vitamin D, c-Met, make sure her vitamin deficiencies have resolved with supplementation.  Generally I think her Crohn's has been well controlled.  She can use Bentyl as needed for occasional pains of her loose stools.  If she is constipated she can try MiraLAX as needed as well.  Moving forward, we will wait for her to be in a steady state after a few more Entyvio infusions, and then I think reasonable to do an MR enterography to evaluate her more proximal small bowel, we have not looked there in over 5 years and she has some intermittent mild pains.  Ensure no persistent inflammatory change or development of stricture etc.  She is agreeable with this.  Otherwise we discussed her headaches.  She really needs to  have a primary care physician for her other healthcare needs and will refer her to [facility] primary care.  In the interim I discussed some options with her, we will give her a trial of Imitrex as needed for onset of headache.  Hopefully this helps her in the interim.  She agrees.  PLAN: - resume Entyvio every 4 weeks. Counseled her on this as above, she will call infusion center to  coordinate - lab for CBC, TIBC / ferritin, Vitamin D, CMET - MRE in 3-4 months - Bentyl PRN - Miralax PRN for constipation - add Imitrex 57m PRN for headaches - referral to [facility] primary care to establish  I spent over 30 minutes of time, including in depth chart review, speaking with patient over video enabled device, and documentation of this encounter.  SJolly Mango MD [facility]

## 2022-09-13 NOTE — Telephone Encounter (Signed)
This has been addressed. Pt's appt has been changed to MyChart video visit.

## 2022-09-14 ENCOUNTER — Encounter (HOSPITAL_COMMUNITY): Payer: Medicaid Other

## 2022-09-26 ENCOUNTER — Ambulatory Visit (HOSPITAL_COMMUNITY)
Admission: RE | Admit: 2022-09-26 | Discharge: 2022-09-26 | Disposition: A | Discharge status: Home / Self Care | Payer: Medicaid Other | Source: Ambulatory Visit | Attending: Gastroenterology | Admitting: Gastroenterology

## 2022-09-26 DIAGNOSIS — K50819 Crohn's disease of both small and large intestine with unspecified complications: Secondary | ICD-10-CM | POA: Insufficient documentation

## 2022-09-26 MED ORDER — VEDOLIZUMAB 300 MG IV SOLR
300.0000 mg | INTRAVENOUS | Status: DC
  Administered 2022-09-26: 300 mg via INTRAVENOUS
  Filled 2022-09-26: qty 5

## 2022-10-19 ENCOUNTER — Emergency Department (HOSPITAL_COMMUNITY)
Admission: EM | Admit: 2022-10-19 | Discharge: 2022-10-19 | Disposition: A | Discharge status: Home / Self Care | Payer: Medicaid Other | Attending: Emergency Medicine | Admitting: Emergency Medicine

## 2022-10-19 ENCOUNTER — Emergency Department (HOSPITAL_COMMUNITY): Payer: Medicaid Other

## 2022-10-19 ENCOUNTER — Encounter (HOSPITAL_COMMUNITY): Payer: Self-pay | Admitting: Emergency Medicine

## 2022-10-19 ENCOUNTER — Other Ambulatory Visit: Payer: Self-pay

## 2022-10-19 DIAGNOSIS — W268XXA Contact with other sharp object(s), not elsewhere classified, initial encounter: Secondary | ICD-10-CM | POA: Insufficient documentation

## 2022-10-19 DIAGNOSIS — S81811A Laceration without foreign body, right lower leg, initial encounter: Secondary | ICD-10-CM | POA: Insufficient documentation

## 2022-10-19 DIAGNOSIS — I10 Essential (primary) hypertension: Secondary | ICD-10-CM | POA: Diagnosis not present

## 2022-10-19 DIAGNOSIS — S8991XA Unspecified injury of right lower leg, initial encounter: Secondary | ICD-10-CM | POA: Diagnosis present

## 2022-10-19 DIAGNOSIS — Z23 Encounter for immunization: Secondary | ICD-10-CM | POA: Diagnosis not present

## 2022-10-19 MED ORDER — LIDOCAINE HCL (PF) 1 % IJ SOLN
20.0000 mL | Freq: Once | INTRAMUSCULAR | Status: AC
  Administered 2022-10-19: 20 mL
  Filled 2022-10-19: qty 30

## 2022-10-19 MED ORDER — BACITRACIN ZINC 500 UNIT/GM EX OINT
TOPICAL_OINTMENT | CUTANEOUS | Status: AC
  Filled 2022-10-19: qty 0.9

## 2022-10-19 MED ORDER — OXYCODONE-ACETAMINOPHEN 5-325 MG PO TABS
1.0000 | ORAL_TABLET | Freq: Once | ORAL | Status: AC
  Administered 2022-10-19: 1 via ORAL
  Filled 2022-10-19: qty 1

## 2022-10-19 MED ORDER — CEPHALEXIN 250 MG PO CAPS: 250.0000 mg | ORAL_CAPSULE | Freq: Four times a day (QID) | ORAL | 0 refills | Status: DC

## 2022-10-19 MED ORDER — MORPHINE SULFATE (PF) 2 MG/ML IV SOLN: 2.0000 mg | Freq: Once | INTRAVENOUS | Status: DC

## 2022-10-19 MED ORDER — TETANUS-DIPHTH-ACELL PERTUSSIS 5-2.5-18.5 LF-MCG/0.5 IM SUSY
0.5000 mL | PREFILLED_SYRINGE | Freq: Once | INTRAMUSCULAR | Status: AC
  Administered 2022-10-19: 0.5 mL via INTRAMUSCULAR
  Filled 2022-10-19: qty 0.5

## 2022-10-19 NOTE — ED Provider Triage Note (Signed)
Emergency Medicine Provider Triage Evaluation Note  Jessica [name] , a 26 y.o. female  was evaluated in triage.  Pt complains of right lower extremity laceration.  Triage RN saw photo from EMS and states that the wound appears to be approximately 8 inches long and appears to be deep in nature.  Wound is wrapped at this time with bleeding controlled.  Patient states she hit her leg on some sort of plastic basket which caused the injury.  Patient is unsure of her last tetanus vaccination  Review of Systems  Positive: As above Negative: As above  Physical Exam  BP (!) 153/103   Pulse 95   Temp 98 F (36.7 C) (Oral)   Resp 16   SpO2 98%  Gen:   Awake, no distress   Resp:  Normal effort  MSK:   Moves extremities without difficulty  Other:    Medical Decision Making  Medically screening exam initiated at 12:52 PM.  Appropriate orders placed.  Jessica [name] was informed that the remainder of the evaluation will be completed by another provider, this initial triage assessment does not replace that evaluation, and the importance of remaining in the ED until their evaluation is complete.     Dorothyann Peng, PA-C 10/19/22 1254

## 2022-10-19 NOTE — ED Provider Notes (Signed)
[facility] DEPT Provider Note   CSN: [id number] Arrival date & time: 10/19/22  1233     History  No chief complaint on file.   Jessica [name] is a 26 y.o. female past medical history of Crohn's disease presenting to the emergency department for evaluation of a laceration on her right lower leg.  Patient states he was reaching to get something in her cabinet when a sharp edge of a plastic box cut on the side of her right leg.  Patient noticed immediate bleeding.  She called for help and was brought to the emergency department.  She has been nonambulatory since this happened.  Bleeding is controlled.  She complains of significant pain on her right leg.  HPI     Home Medications Prior to Admission medications   Medication Sig Start Date End Date Taking? Authorizing Provider  Cholecalciferol (VITAMIN D3) 125 MCG (5000 UT) CAPS Take 1 capsule (5,000 Units total) by mouth daily. 02/03/22   Armbruster, Carlota Raspberry, MD  dicyclomine (BENTYL) 10 MG capsule Take 1 capsule (10 mg total) by mouth every 8 (eight) hours as needed (diarrhea/abdominal cramping). 03/07/22   Armbruster, Carlota Raspberry, MD  ferrous sulfate 325 (65 FE) MG EC tablet Take 1 tablet (325 mg total) by mouth daily with breakfast. 02/03/22   Armbruster, Carlota Raspberry, MD  sodium chloride 0.9 % SOLN 250 mL with vedolizumab 300 MG SOLR 300 mg Inject 300 mg into the vein every 28 (twenty-eight) days. Entyvio every 4 weeks at [facility] infusion center. 02/08/22   Armbruster, Carlota Raspberry, MD  SUMAtriptan (IMITREX) 50 MG tablet Take one tablet (50 mg) at onset of symptoms. May repeat in 2 hours if headache persists or recurs, for a total of 100 mg. 09/13/22   Armbruster, Carlota Raspberry, MD      Allergies    Patient has no known allergies.    Review of Systems   Review of Systems  Skin:        Laceration to right leg.    Physical Exam Updated Vital Signs BP (!) 153/103   Pulse 95   Temp 98 F (36.7 C) (Oral)   Resp 16   SpO2  98%  Physical Exam Vitals and nursing note reviewed.  Constitutional:      Appearance: Normal appearance.  HENT:     Head: Normocephalic and atraumatic.     Mouth/Throat:     Mouth: Mucous membranes are moist.  Eyes:     General: No scleral icterus. Cardiovascular:     Rate and Rhythm: Normal rate and regular rhythm.     Pulses: Normal pulses.     Heart sounds: Normal heart sounds.  Pulmonary:     Effort: Pulmonary effort is normal.     Breath sounds: Normal breath sounds.  Abdominal:     General: Abdomen is flat.     Palpations: Abdomen is soft.     Tenderness: There is no abdominal tenderness.  Musculoskeletal:        General: No deformity.  Skin:    General: Skin is warm.     Findings: No rash.     Comments: 5 cm linear laceration to the lateral side of right lower extremity.  Bleeding is controlled.  Laceration is through the adipose tissue with no injury to the muscle layer visualized.  Neurological:     General: No focal deficit present.     Mental Status: She is alert.  Psychiatric:  Mood and Affect: Mood normal.      ED Results / Procedures / Treatments   Labs (all labs ordered are listed, but only abnormal results are displayed) Labs Reviewed - No data to display  EKG None  Radiology No results found.  Procedures .Marland KitchenLaceration Repair  Date/Time: 10/19/2022 3:46 PM  Performed by: Rex Kras, PA Authorized by: Rex Kras, PA   Consent:    Consent obtained:  Verbal   Consent given by:  Patient   Risks discussed:  Infection and pain Universal protocol:    Procedure explained and questions answered to patient or proxy's satisfaction: yes     Relevant documents present and verified: yes     Test results available: yes     Imaging studies available: yes     Patient identity confirmed:  Verbally with patient and arm band Anesthesia:    Anesthesia method:  Local infiltration   Local anesthetic:  Lidocaine 1% WITH epi Laceration details:    Location:  R lower leg.   Length (cm):  6   Depth (mm):  8 Pre-procedure details:    Preparation:  Patient was prepped and draped in usual sterile fashion and imaging obtained to evaluate for foreign bodies Exploration:    Limited defect created (wound extended): no     Hemostasis achieved with:  Direct pressure   Imaging obtained: x-ray     Imaging outcome: foreign body not noted     Wound exploration: wound explored through full range of motion     Contaminated: no   Treatment:    Area cleansed with:  Saline   Amount of cleaning:  Extensive   Irrigation solution:  Sterile water   Irrigation volume:  500   Irrigation method:  Pressure wash   Visualized foreign bodies/material removed: no     Debridement:  None   Undermining:  None   Layers/structures repaired:  Deep subcutaneous Deep subcutaneous:    Suture size:  4-0   Suture material:  Vicryl   Suture technique:  Simple interrupted   Number of sutures:  6 Skin repair:    Repair method:  Sutures   Suture size:  3-0   Suture technique:  Simple interrupted   Number of sutures:  8 Approximation:    Approximation:  Close Repair type:    Repair type:  Complex Post-procedure details:    Dressing:  Antibiotic ointment and sterile dressing   Procedure completion:  Tolerated     Medications Ordered in ED Medications  Tdap (BOOSTRIX) injection 0.5 mL (has no administration in time range)    ED Course/ Medical Decision Making/ A&P                           Medical Decision Making Amount and/or Complexity of Data Reviewed Radiology: ordered.  Risk Prescription drug management.   This patient presents to the ED for concern of laceration to the right lower leg, this involves an extensive number of treatment options, and is a complaint that carries with a high risk of complications and morbidity.  The differential diagnosis includes laceration, cellulitis, fibula fracture, dislocation, soft tissue infection.  This is not an  exhaustive list.  Comorbidities that complicate the patient evaluation See HPI  Social determinants of health NA  Additional history obtained: Additional history obtained from EMR. External records from outside source obtained and review including prior labs  Cardiac monitoring/EKG: The patient was maintained on a cardiac monitor.  I  personally reviewed and interpreted the cardiac monitor which showed an underlying rhythm of: Sinus rhythm.  Lab tests: None  Imaging studies: I ordered imaging studies including x-ray of right tibia fibula which showed no evidence of foreign body or acute osseous abnormality. I personally reviewed, interpreted imaging and agree with the radiologist's interpretations.  Problem list/ ED course/ Critical interventions/ Medical management: HPI: See above Vital signs significant for BP 140/83.  Otherwise within normal range and stable throughout visit. Laboratory/imaging studies significant for: See above. On physical examination, patient is afebrile and appears in no acute distress. There is a 5 cm linear laceration to the lateral side of right lower extremity.  Bleeding is controlled.  Laceration is through the adipose tissue with no injury to the muscle layer visualized.  I discussed case with my attending Dr. Sherry Ruffing who recommended wound closure in the ER.  Patient's clinical presentations and laboratory/imaging studies are most concerned for laceration.  Low suspicions for cellulitis, compartment syndrome.  See laceration repair procedure above.  Patient tolerated procedure well.  I sent an Rx of Keflex as the wound is deep with high risk for infection.  I recommended patient to follow-up with orthopedic after 7 days for reevaluation and suture removal. Percocet ordered.  Tdap injection given.  Reevaluation of the patient after these medications showed that the patient improved.   I have reviewed the patient home medication medicines and have him make  adjustments as needed.  Consultations obtained: NA  Disposition Continued outpatient therapy. Follow-up with orthopedics recommended for reevaluation of symptoms.  Return to urgent care or emergency department for suture removal after 7 days. Treatment plan discussed with patient.  Pt acknowledged understanding was agreeable to the plan. Worrisome signs and symptoms were discussed with patient, and patient acknowledged understanding to return to the ED if they noticed these signs and symptoms. Patient was stable upon discharge.   This chart was dictated using voice recognition software.  Despite best efforts to proofread,  errors can occur which can change the documentation meaning.          Final Clinical Impression(s) / ED Diagnoses Final diagnoses:  Laceration of right lower leg, initial encounter    Rx / DC Orders ED Discharge Orders          Ordered    cephALEXin (KEFLEX) 250 MG capsule  4 times daily        10/19/22 1549              Rex Kras, [place] 10/19/22 2016    Tegeler, Gwenyth Allegra, MD 10/20/22 [phone]

## 2022-10-19 NOTE — Discharge Instructions (Addendum)
Please take keflex as prescribed. Return to urgent care or emergency department after 7 days for suture removal. Please take tylenol/ibuprofen for pain. I recommend close follow-up with PCP for reevaluation.  Please do not hesitate to return to emergency department if worrisome signs symptoms we discussed become apparent.

## 2022-10-19 NOTE — ED Triage Notes (Signed)
BIBA Per EMS: pt reports cutting leg on plastic box. Cut is very deep.  146/98 88 HR  98% RA

## 2022-10-24 ENCOUNTER — Encounter (HOSPITAL_COMMUNITY): Payer: Medicaid Other

## 2022-10-26 ENCOUNTER — Ambulatory Visit
Admission: RE | Admit: 2022-10-26 | Discharge: 2022-10-26 | Disposition: A | Discharge status: Home / Self Care | Payer: Medicaid Other | Source: Ambulatory Visit

## 2022-10-26 VITALS — BP 132/91 | HR 96 | Temp 98.2°F | Resp 16

## 2022-10-26 DIAGNOSIS — Z4802 Encounter for removal of sutures: Secondary | ICD-10-CM | POA: Diagnosis not present

## 2022-10-26 NOTE — ED Triage Notes (Addendum)
Patient presents for suture removal. There are 8 sutures noted, the wound is has a scant amount of yellow and bloody drainage.

## 2022-10-26 NOTE — ED Notes (Signed)
Sutures seen by provider and states removal is okay.

## 2022-10-26 NOTE — ED Provider Notes (Signed)
[facility]   CSN: [id number] Arrival date & time: 10/26/22  1548    HISTORY   Chief Complaint  Patient presents with   Suture / Staple Removal   HPI Jessica [name] is a pleasant, 26 y.o. female who presents to urgent care today. Patient presents for removal of 8  simple interrupted 3-0 Ethilon sutures expertly placed by Dr. Harrell Gave Tegeler at [facility] emergency room on 11/12-2021.  Patient's wound was 8 mm deep therefore the deep subcutaneous layer of simple interrupted 4-0 Vicryl sutures was placed prior to placement of the Ethilon sutures.  Patient states her wound is feeling much better at this time and reports no concerns.   The history is provided by the patient.   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/22/2015   Diarrhea 01/22/2015   Nausea 01/03/2015   Absolute anemia    CC (Crohn's colitis) ([place]) 11/02/2014   Rectal bleeding 11/02/2014   Crohn's disease ([place]) 05/03/2013   Past Surgical History:  Procedure Laterality Date   COLONOSCOPY N/A 01/22/2015   Procedure: COLONOSCOPY;  Surgeon: [name], MD;  Location: [facility];  Service: Endoscopy;  Laterality: N/A;   DILATION AND EVACUATION N/A 10/31/2014   Procedure: DILATATION AND EVACUATION;  Surgeon: Lahoma Crocker, MD;  Location: [facility];  Service: Gynecology;  Laterality: N/A;   ESOPHAGOGASTRODUODENOSCOPY N/A 01/22/2015   Procedure: ESOPHAGOGASTRODUODENOSCOPY (EGD);  Surgeon: [name], MD;  Location: [facility];  Service: Endoscopy;  Laterality: N/A;   FLEXIBLE SIGMOIDOSCOPY N/A 11/04/2014   Procedure: FLEXIBLE SIGMOIDOSCOPY;  Surgeon: [name], MD;  Location: [facility];   Service: Endoscopy;  Laterality: N/A;   ILEOSTOMY CLOSURE N/A 12/18/2014   Procedure: LOOP ILEOSTOMY REVERSAL;  Surgeon: [name], MD;  Location: [facility];  Service: General;  Laterality: N/A;   INCISION AND DRAINAGE Left    arm   LAPAROSCOPIC TUBAL LIGATION Bilateral 02/19/2020   Procedure: LAPAROSCOPIC TUBAL LIGATION;  Surgeon: Woodroe Mode, MD;  Location: [facility];  Service: Gynecology;  Laterality: Bilateral;   SMALL INTESTINE SURGERY     WISDOM TOOTH EXTRACTION     OB History     Gravida  4   Para  3   Term  3   Preterm      AB  1   Living  3      SAB  1   IAB      Ectopic      Multiple  0   Live Births  3          Home Medications    Prior to Admission medications   Medication Sig Start Date End Date Taking? Authorizing Provider  cephALEXin (KEFLEX) 250 MG capsule Take 1 capsule (250 mg total) by mouth 4 (four) times daily. 10/19/22   Rex Kras, PA  Cholecalciferol (VITAMIN D3) 125 MCG (5000 UT) CAPS Take 1 capsule (5,000 Units total) by mouth daily. 02/03/22   Armbruster, Carlota Raspberry, MD  dicyclomine (BENTYL) 10 MG capsule Take 1 capsule (10 mg total) by mouth every 8 (eight) hours as needed (diarrhea/abdominal cramping). 03/07/22   Armbruster, Carlota Raspberry,  MD  ferrous sulfate 325 (65 FE) MG EC tablet Take 1 tablet (325 mg total) by mouth daily with breakfast. 02/03/22   Armbruster, Carlota Raspberry, MD  sodium chloride 0.9 % SOLN 250 mL with vedolizumab 300 MG SOLR 300 mg Inject 300 mg into the vein every 28 (twenty-eight) days. Entyvio every 4 weeks at [facility]. 02/08/22   Armbruster, Carlota Raspberry, MD  SUMAtriptan (IMITREX) 50 MG tablet Take one tablet (50 mg) at onset of symptoms. May repeat in 2 hours if headache persists or recurs, for a total of 100 mg. 09/13/22   Armbruster, Carlota Raspberry, MD    Family History Family History  Problem Relation Age of Onset   Diabetes Father    Diabetes Maternal Grandmother    Heart disease Maternal Grandmother     Hypertension Maternal Grandmother    Kidney disease Maternal Grandmother    Lung cancer Maternal Grandfather    Hyperlipidemia Maternal Grandfather    Stroke Neg Hx    Colon cancer Neg Hx    Colon polyps Neg Hx    Esophageal cancer Neg Hx    Rectal cancer Neg Hx    Stomach cancer Neg Hx    Social History Social History   Tobacco Use   Smoking status: Never   Smokeless tobacco: Never   Tobacco comments:    occasional cigarette  Vaping Use   Vaping Use: Never used  Substance Use Topics   Alcohol use: No    Alcohol/week: 0.0 standard drinks of alcohol   Drug use: No   Allergies   Patient has no known allergies.  Review of Systems Review of Systems Pertinent findings revealed after performing a 14 point review of systems has been noted in the history of present illness.  Physical Exam Triage Vital Signs ED Triage Vitals  Enc Vitals Group     BP 10/15/21 0827 (!) 147/82     Pulse Rate 10/15/21 0827 72     Resp 10/15/21 0827 18     Temp 10/15/21 0827 98.3 F (36.8 C)     Temp Source 10/15/21 0827 Oral     SpO2 10/15/21 0827 98 %     Weight --      Height --      Head Circumference --      Peak Flow --      Pain Score 10/15/21 0826 5     Pain Loc --      Pain Edu? --      Excl. in [place]? --   No data found.  Updated Vital Signs BP (!) 132/91 (BP Location: Left Arm)   Pulse 96   Temp 98.2 F (36.8 C) (Oral)   Resp 16   LMP 10/12/2022   SpO2 97%   Physical Exam Vitals and nursing note reviewed.  Constitutional:      General: She is awake. She is not in acute distress.    Appearance: Normal appearance. She is not ill-appearing.  HENT:     Head: Normocephalic and atraumatic.  Musculoskeletal:        General: Normal range of motion.  Skin:    General: Skin is warm and dry.          Comments: Laceration right lower extremity with mild surrounding erythema and scant drainage, no induration or tenderness to palpation appreciated.  Neurological:     General:  No focal deficit present.     Mental Status: She is alert and oriented to person, place, and  time. Mental status is at baseline.     Cranial Nerves: Cranial nerves 2-12 are intact.     Coordination: Coordination is intact.  Psychiatric:        Attention and Perception: Attention and perception normal.        Mood and Affect: Mood and affect normal.        Speech: Speech normal.        Behavior: Behavior normal. Behavior is cooperative.        Thought Content: Thought content normal.        Cognition and Memory: Cognition and memory normal.        Judgment: Judgment normal.     Visual Acuity Right Eye Distance:   Left Eye Distance:   Bilateral Distance:    Right Eye Near:   Left Eye Near:    Bilateral Near:     UC Couse / Diagnostics / Procedures:     Radiology No results found.  Procedures Procedures (including critical care time) EKG  Pending results:  Labs Reviewed - No data to display  Medications Ordered in UC: Medications - No data to display  UC Diagnoses / Final Clinical Impressions(s)   I have reviewed the triage vital signs and the nursing notes.  Pertinent labs & imaging results that were available during my care of the patient were reviewed by me and considered in my medical decision making (see chart for details).    Final diagnoses:  Visit for suture removal   Sutures were removed without incident.  Patient was provided with aftercare instructions.  ED Prescriptions   None    PDMP not reviewed this encounter.  Pending results:  Labs Reviewed - No data to display  Discharge Instructions: Discharge Instructions   None     Disposition Upon Discharge:  Condition: stable for discharge home  Patient presented with an acute illness with associated systemic symptoms and significant discomfort requiring urgent management. In my opinion, this is a condition that a prudent lay person (someone who possesses an average knowledge of health and  medicine) may potentially expect to result in complications if not addressed urgently such as respiratory distress, impairment of bodily function or dysfunction of bodily organs.   Routine symptom specific, illness specific and/or disease specific instructions were discussed with the patient and/or caregiver at length.   As such, the patient has been evaluated and assessed, work-up was performed and treatment was provided in alignment with urgent care protocols and evidence based medicine.  Patient/parent/caregiver has been advised that the patient may require follow up for further testing and treatment if the symptoms continue in spite of treatment, as clinically indicated and appropriate.  Patient/parent/caregiver has been advised to return to the [facility] or PCP if no better; to PCP or the Emergency Department if new signs and symptoms develop, or if the current signs or symptoms continue to change or worsen for further workup, evaluation and treatment as clinically indicated and appropriate  The patient will follow up with their current PCP if and as advised. If the patient does not currently have a PCP we will assist them in obtaining one.   The patient may need specialty follow up if the symptoms continue, in spite of conservative treatment and management, for further workup, evaluation, consultation and treatment as clinically indicated and appropriate.   Patient/parent/caregiver verbalized understanding and agreement of plan as discussed.  All questions were addressed during visit.  Please see discharge instructions below for further details of plan.  This office note has been dictated using Museum/gallery curator.  Unfortunately, this method of dictation can sometimes lead to typographical or grammatical errors.  I apologize for your inconvenience in advance if this occurs.  Please do not hesitate to reach out to me if clarification is needed.      Lynden Oxford Scales,  PA-C 10/27/22 [phone]

## 2022-11-07 ENCOUNTER — Other Ambulatory Visit: Payer: Self-pay | Admitting: Gastroenterology

## 2022-11-07 ENCOUNTER — Ambulatory Visit: Payer: Medicaid Other | Admitting: Family Medicine

## 2022-11-07 NOTE — Telephone Encounter (Signed)
Yes we can refill this if it is working for her.  I added this for trial to treat migraine headaches, if its helping we can continue it, although she should follow-up with her PCM long-term for her headache management.  Thanks

## 2022-11-08 ENCOUNTER — Ambulatory Visit: Payer: Medicaid Other | Admitting: Family Medicine

## 2022-11-21 ENCOUNTER — Encounter (HOSPITAL_COMMUNITY): Payer: Medicaid Other

## 2022-12-01 ENCOUNTER — Telehealth: Payer: Self-pay

## 2022-12-01 NOTE — Telephone Encounter (Signed)
Order for MR Enterography Abd/ Pelvis is in.  Message sent to schedulers to call patient to schedule scan in early January.

## 2022-12-01 NOTE — Telephone Encounter (Signed)
-----   Message from Roetta Sessions, [place] sent at 09/13/2022  9:50 AM EDT ----- Regarding: MR entero Ab and Pelvis Schedule patient for MR entero Ab and Pelvis in early January.  Orders are in. She had OV in late Sept and is aware we will schedule her for scan in 3 months

## 2022-12-09 ENCOUNTER — Ambulatory Visit (HOSPITAL_COMMUNITY)
Admission: RE | Admit: 2022-12-09 | Discharge: 2022-12-09 | Disposition: A | Discharge status: Home / Self Care | Payer: Medicaid Other | Source: Ambulatory Visit | Attending: Gastroenterology | Admitting: Gastroenterology

## 2022-12-09 DIAGNOSIS — K50819 Crohn's disease of both small and large intestine with unspecified complications: Secondary | ICD-10-CM | POA: Insufficient documentation

## 2022-12-09 MED ORDER — VEDOLIZUMAB 300 MG IV SOLR
300.0000 mg | INTRAVENOUS | Status: DC
  Administered 2022-12-09: 300 mg via INTRAVENOUS
  Filled 2022-12-09: qty 5

## 2022-12-22 ENCOUNTER — Ambulatory Visit (HOSPITAL_COMMUNITY): Payer: Medicaid Other

## 2022-12-23 ENCOUNTER — Ambulatory Visit: Payer: Medicaid Other | Admitting: Family Medicine

## 2022-12-29 ENCOUNTER — Ambulatory Visit (HOSPITAL_COMMUNITY)
Admission: RE | Admit: 2022-12-29 | Discharge: 2022-12-29 | Disposition: A | Discharge status: Home / Self Care | Payer: Medicaid Other | Source: Ambulatory Visit | Attending: Gastroenterology | Admitting: Gastroenterology

## 2022-12-29 ENCOUNTER — Encounter: Payer: Self-pay | Admitting: Gastroenterology

## 2022-12-29 DIAGNOSIS — G43809 Other migraine, not intractable, without status migrainosus: Secondary | ICD-10-CM | POA: Insufficient documentation

## 2022-12-29 DIAGNOSIS — K5080 Crohn's disease of both small and large intestine without complications: Secondary | ICD-10-CM | POA: Insufficient documentation

## 2022-12-29 DIAGNOSIS — D509 Iron deficiency anemia, unspecified: Secondary | ICD-10-CM

## 2022-12-29 DIAGNOSIS — E559 Vitamin D deficiency, unspecified: Secondary | ICD-10-CM | POA: Insufficient documentation

## 2022-12-29 DIAGNOSIS — K6389 Other specified diseases of intestine: Secondary | ICD-10-CM | POA: Diagnosis not present

## 2022-12-29 DIAGNOSIS — K7689 Other specified diseases of liver: Secondary | ICD-10-CM | POA: Diagnosis not present

## 2022-12-29 DIAGNOSIS — K429 Umbilical hernia without obstruction or gangrene: Secondary | ICD-10-CM | POA: Diagnosis not present

## 2022-12-29 MED ORDER — GADOBUTROL 1 MMOL/ML IV SOLN
10.0000 mL | Freq: Once | INTRAVENOUS | Status: AC | PRN
  Administered 2022-12-29: 10 mL via INTRAVENOUS

## 2023-01-06 ENCOUNTER — Inpatient Hospital Stay (HOSPITAL_COMMUNITY): Admission: RE | Admit: 2023-01-06 | Payer: Medicaid Other | Source: Ambulatory Visit

## 2023-01-12 ENCOUNTER — Encounter (HOSPITAL_COMMUNITY): Payer: Medicaid Other

## 2023-02-03 ENCOUNTER — Encounter (HOSPITAL_COMMUNITY): Payer: Medicaid Other

## 2023-02-09 ENCOUNTER — Encounter (HOSPITAL_COMMUNITY): Payer: Medicaid Other

## 2023-02-21 ENCOUNTER — Encounter (HOSPITAL_COMMUNITY): Payer: Self-pay

## 2023-02-21 ENCOUNTER — Emergency Department (HOSPITAL_COMMUNITY)
Admission: EM | Admit: 2023-02-21 | Discharge: 2023-02-21 | Disposition: A | Discharge status: Home / Self Care | Payer: Medicaid Other | Attending: Emergency Medicine | Admitting: Emergency Medicine

## 2023-02-21 ENCOUNTER — Other Ambulatory Visit: Payer: Self-pay

## 2023-02-21 DIAGNOSIS — Z20822 Contact with and (suspected) exposure to covid-19: Secondary | ICD-10-CM | POA: Insufficient documentation

## 2023-02-21 DIAGNOSIS — R112 Nausea with vomiting, unspecified: Secondary | ICD-10-CM | POA: Diagnosis not present

## 2023-02-21 DIAGNOSIS — J101 Influenza due to other identified influenza virus with other respiratory manifestations: Secondary | ICD-10-CM | POA: Insufficient documentation

## 2023-02-21 DIAGNOSIS — R109 Unspecified abdominal pain: Secondary | ICD-10-CM | POA: Diagnosis not present

## 2023-02-21 DIAGNOSIS — R059 Cough, unspecified: Secondary | ICD-10-CM | POA: Diagnosis present

## 2023-02-21 DIAGNOSIS — J111 Influenza due to unidentified influenza virus with other respiratory manifestations: Secondary | ICD-10-CM | POA: Diagnosis not present

## 2023-02-21 LAB — RESP PANEL BY RT-PCR (RSV, FLU A&B, COVID)  RVPGX2
Influenza A by PCR: NEGATIVE
Influenza B by PCR: POSITIVE — AB
Resp Syncytial Virus by PCR: NEGATIVE
SARS Coronavirus 2 by RT PCR: NEGATIVE

## 2023-02-21 LAB — I-STAT BETA HCG BLOOD, ED (MC, WL, AP ONLY): I-stat hCG, quantitative: <5 m[IU]/mL (ref <5)

## 2023-02-21 LAB — GROUP A STREP BY PCR: Group A Strep by PCR: NOT DETECTED

## 2023-02-21 MED ORDER — ACETAMINOPHEN 325 MG PO TABS: 650.0000 mg | ORAL_TABLET | Freq: Four times a day (QID) | ORAL | Status: DC | PRN

## 2023-02-21 MED ORDER — METOCLOPRAMIDE HCL 5 MG/ML IJ SOLN
10.0000 mg | Freq: Once | INTRAMUSCULAR | Status: AC
  Administered 2023-02-21: 10 mg via INTRAVENOUS
  Filled 2023-02-21: qty 2

## 2023-02-21 MED ORDER — ACETAMINOPHEN 500 MG PO TABS
1000.0000 mg | ORAL_TABLET | Freq: Four times a day (QID) | ORAL | Status: DC | PRN
  Filled 2023-02-21: qty 2

## 2023-02-21 MED ORDER — KETOROLAC TROMETHAMINE 15 MG/ML IJ SOLN
15.0000 mg | Freq: Once | INTRAMUSCULAR | Status: AC
  Administered 2023-02-21: 15 mg via INTRAVENOUS
  Filled 2023-02-21: qty 1

## 2023-02-21 MED ORDER — ONDANSETRON 4 MG PO TBDP
4.0000 mg | ORAL_TABLET | Freq: Once | ORAL | Status: DC
  Filled 2023-02-21: qty 1

## 2023-02-21 MED ORDER — SODIUM CHLORIDE 0.9 % IV BOLUS
500.0000 mL | Freq: Once | INTRAVENOUS | Status: AC
  Administered 2023-02-21: 500 mL via INTRAVENOUS

## 2023-02-21 MED ORDER — DIPHENHYDRAMINE HCL 50 MG/ML IJ SOLN
12.5000 mg | Freq: Once | INTRAMUSCULAR | Status: AC
  Administered 2023-02-21: 12.5 mg via INTRAVENOUS
  Filled 2023-02-21: qty 1

## 2023-02-21 MED ORDER — ONDANSETRON 4 MG PO TBDP: 4.0000 mg | ORAL_TABLET | Freq: Three times a day (TID) | ORAL | 0 refills | Status: DC | PRN

## 2023-02-21 NOTE — ED Triage Notes (Signed)
Cough, sneezing, migraines, can't keep food/drink down, sweats at night and then getting very cold. Child was recently dignosed with RSV and bronchitis.

## 2023-02-21 NOTE — ED Provider Notes (Signed)
[facility] Provider Note   CSN: [id number] Arrival date & time: 02/21/23  [id number]     History  Chief Complaint  Patient presents with   URI   Emesis   Nausea    Jessica [name] is a 27 y.o. female with a past medical history of Crohn's disease presents today for evaluation of cough.  Patient states she has had a cough, fever, nausea, vomiting and abdominal pain in the last 5 days.  Patient states her kids at home is sick with RSV.  Patient states she has tried DayQuil/NyQuil at home with minimal improvement.  She reports fever at home of 101.  She also reported headache that is similar to her migraine headaches in the past.  She denied chest pain, shortness of breath, bowel changes, urinary symptoms.   URI Emesis Associated symptoms: URI     Past Medical History:  Diagnosis Date   Anemia    Crohn's disease ([place])    Osteoporosis    Supervision of normal first pregnancy 05/03/2013   Uveitis    Vitamin D deficiency    Past Surgical History:  Procedure Laterality Date   COLONOSCOPY N/A 01/22/2015   Procedure: COLONOSCOPY;  Surgeon: [name], MD;  [facility]: [facility];  Service: Endoscopy;  Laterality: N/A;   DILATION AND EVACUATION N/A 10/31/2014   Procedure: DILATATION AND EVACUATION;  Surgeon: Lahoma Crocker, MD;  Location: [facility];  Service: Gynecology;  Laterality: N/A;   ESOPHAGOGASTRODUODENOSCOPY N/A 01/22/2015   Procedure: ESOPHAGOGASTRODUODENOSCOPY (EGD);  Surgeon: [name], MD;  Location: [facility];  Service: Endoscopy;  Laterality: N/A;   FLEXIBLE SIGMOIDOSCOPY N/A 11/04/2014   Procedure: FLEXIBLE SIGMOIDOSCOPY;  Surgeon: [name], MD;  Location: [facility]COPY;  Service: Endoscopy;  Laterality: N/A;   ILEOSTOMY CLOSURE N/A 12/18/2014   Procedure: LOOP ILEOSTOMY REVERSAL;  Surgeon: [name], MD;  Location: [facility];  Service: General;  Laterality: N/A;   INCISION AND DRAINAGE Left    arm   LAPAROSCOPIC  TUBAL LIGATION Bilateral 02/19/2020   Procedure: LAPAROSCOPIC TUBAL LIGATION;  Surgeon: Woodroe Mode, MD;  Location: [facility];  Service: Gynecology;  Laterality: Bilateral;   SMALL INTESTINE SURGERY     WISDOM TOOTH EXTRACTION       Home Medications Prior to Admission medications   Medication Sig Start Date End Date Taking? Authorizing Provider  ondansetron (ZOFRAN-ODT) 4 MG disintegrating tablet Take 1 tablet (4 mg total) by mouth every 8 (eight) hours as needed for nausea or vomiting. 02/21/23  Yes Rex Kras, PA  cephALEXin (KEFLEX) 250 MG capsule Take 1 capsule (250 mg total) by mouth 4 (four) times daily. 10/19/22   Rex Kras, PA  Cholecalciferol (VITAMIN D3) 125 MCG (5000 UT) CAPS Take 1 capsule (5,000 Units total) by mouth daily. 02/03/22   Armbruster, Carlota Raspberry, MD  dicyclomine (BENTYL) 10 MG capsule Take 1 capsule (10 mg total) by mouth every 8 (eight) hours as needed (diarrhea/abdominal cramping). 03/07/22   Armbruster, Carlota Raspberry, MD  ferrous sulfate 325 (65 FE) MG EC tablet Take 1 tablet (325 mg total) by mouth daily with breakfast. 02/03/22   Armbruster, Carlota Raspberry, MD  sodium chloride 0.9 % SOLN 250 mL with vedolizumab 300 MG SOLR 300 mg Inject 300 mg into the vein every 28 (twenty-eight) days. Entyvio every 4 weeks at [facility] infusion center. 02/08/22   Armbruster, Carlota Raspberry, MD  SUMAtriptan (IMITREX) 50 MG tablet TAKE ONE TABLET (50 MG) AT  ONSET OF SYMPTOMS. MAY REPEAT IN 2 HOURS IF HEADACHE PERSISTS OR RECURS, FOR A TOTAL OF 100 MG. 11/07/22   Armbruster, Carlota Raspberry, MD      Allergies    Patient has no known allergies.    Review of Systems   Review of Systems  Gastrointestinal:  Positive for vomiting.    Physical Exam Updated Vital Signs BP (!) 136/92 (BP Location: Right Arm)   Pulse (!) 102   Temp 98.8 F (37.1 C) (Oral)   Resp 16   Ht '5\' 6"'$  (1.676 m)   Wt 104.3 kg   SpO2 100%   BMI 37.12 kg/m  Physical Exam Vitals and nursing note reviewed.  Constitutional:       Appearance: Normal appearance.  HENT:     Head: Normocephalic and atraumatic.     Mouth/Throat:     Mouth: Mucous membranes are moist.     Comments: Mild posterior oropharyngeal erythema. Eyes:     General: No scleral icterus. Cardiovascular:     Rate and Rhythm: Normal rate and regular rhythm.     Pulses: Normal pulses.     Heart sounds: Normal heart sounds.  Pulmonary:     Effort: Pulmonary effort is normal.     Breath sounds: Normal breath sounds.  Abdominal:     General: Abdomen is flat.     Palpations: Abdomen is soft.     Tenderness: There is no abdominal tenderness.  Musculoskeletal:        General: No deformity.  Skin:    General: Skin is warm.     Findings: No rash.  Neurological:     General: No focal deficit present.     Mental Status: She is alert.  Psychiatric:        Mood and Affect: Mood normal.     ED Results / Procedures / Treatments   Labs (all labs ordered are listed, but only abnormal results are displayed) Labs Reviewed  RESP PANEL BY RT-PCR (RSV, FLU A&B, COVID)  RVPGX2 - Abnormal; Notable for the following components:      Result Value   Influenza B by PCR POSITIVE (*)    All other components within normal limits  GROUP A STREP BY PCR  I-STAT BETA HCG BLOOD, ED ([facility], [facility], [facility] ONLY)    EKG None  Radiology No results found.  Procedures Procedures    Medications Ordered in ED Medications  ketorolac (TORADOL) 15 MG/ML injection 15 mg (15 mg Intravenous Given 02/21/23 1027)  metoCLOPramide (REGLAN) injection 10 mg (10 mg Intravenous Given 02/21/23 1027)  diphenhydrAMINE (BENADRYL) injection 12.5 mg (12.5 mg Intravenous Given 02/21/23 1027)  sodium chloride 0.9 % bolus 500 mL (500 mLs Intravenous New Bag/Given 02/21/23 1026)    ED Course/ Medical Decision Making/ A&P                             Medical Decision Making Risk OTC drugs. Prescription drug management.   This patient presents to the ED for sore throat, body aches, this involves  an extensive number of treatment options, and is a complaint that carries with a high risk of complications and morbidity.  The differential diagnosis includes flu, COVID, RSV, strep, pharyngitis, bronchitis, pneumonia, infectious etiology.  This is not an exhaustive list.  Lab tests: I ordered and personally interpreted labs.  The pertinent results include: Viral panel positive for flu.  Imaging studies:  Problem list/ ED course/ Critical interventions/  Medical management: HPI: See above Vital signs within normal range and stable throughout visit. Laboratory/imaging studies significant for: See above. On physical examination, patient is afebrile and appears in no acute distress. This patient presents with symptoms suspicious for flu. Based on history and physical doubt sinusitis. COVID test was negative. Do not suspect underlying cardiopulmonary process. I considered, but think unlikely, pneumonia. Patient is nontoxic appearing and not in need of emergent medical intervention.  Patient's headache is most consistent with a benign etiology possibly migraine.  Low suspicion for ICH or CVA as patient does not have any focal neurodeficits, no recent trauma or head injury.  Migraine cocktail ordered.  Reevaluation of patient after this medication showed that patient improved.  Patient told to self isolate at home until symptoms subside for 72 hours. Recommended patient to take TheraFlu or Mucinex for symptom relief.  Follow-up with primary care physician for further evaluation and management.  Return to the ER if new or worsening symptoms. I have reviewed the patient home medicines and have made adjustments as needed.  Cardiac monitoring/EKG: The patient was maintained on a cardiac monitor.  I personally reviewed and interpreted the cardiac monitor which showed an underlying rhythm of: sinus rhythm.  Additional history obtained: External records from outside source obtained and reviewed including: Chart  review including previous notes, labs, imaging.  Consultations obtained:  Disposition Continued outpatient therapy. Follow-up with PCP recommended for reevaluation of symptoms. Treatment plan discussed with patient.  Pt acknowledged understanding was agreeable to the plan. Worrisome signs and symptoms were discussed with patient, and patient acknowledged understanding to return to the ED if they noticed these signs and symptoms. Patient was stable upon discharge.   This chart was dictated using voice recognition software.  Despite best efforts to proofread,  errors can occur which can change the documentation meaning.          Final Clinical Impression(s) / ED Diagnoses Final diagnoses:  Flu    Rx / DC Orders ED Discharge Orders          Ordered    ondansetron (ZOFRAN-ODT) 4 MG disintegrating tablet  Every 8 hours PRN        02/21/23 1127              Rex Kras, [place] 02/21/23 1132    Valarie Merino, MD 02/21/23 [phone]

## 2023-02-21 NOTE — Discharge Instructions (Addendum)
You tested negative for COVID, strep and positive for flu today. Please take tylenol/ibuprofen for pain/fever.  You can try Mucinex, Dayquil/Nyquil TheraFlu for symptom relief.  I recommend close follow-up with PCP for reevaluation.  Please do not hesitate to return to emergency department if worrisome signs symptoms we discussed become apparent.

## 2023-03-01 ENCOUNTER — Other Ambulatory Visit: Payer: Self-pay

## 2023-03-01 DIAGNOSIS — K5080 Crohn's disease of both small and large intestine without complications: Secondary | ICD-10-CM

## 2023-03-02 ENCOUNTER — Inpatient Hospital Stay (HOSPITAL_COMMUNITY): Admission: RE | Admit: 2023-03-02 | Payer: Medicaid Other | Source: Ambulatory Visit

## 2023-03-30 ENCOUNTER — Encounter (HOSPITAL_COMMUNITY): Payer: Medicaid Other

## 2023-06-23 ENCOUNTER — Inpatient Hospital Stay (HOSPITAL_COMMUNITY): Admission: RE | Admit: 2023-06-23 | Payer: Medicaid Other | Source: Ambulatory Visit

## 2023-07-07 ENCOUNTER — Telehealth: Payer: Self-pay | Admitting: Gastroenterology

## 2023-07-07 ENCOUNTER — Ambulatory Visit (HOSPITAL_COMMUNITY)
Admission: RE | Admit: 2023-07-07 | Discharge: 2023-07-07 | Disposition: A | Discharge status: Home / Self Care | Payer: 59 | Source: Ambulatory Visit | Attending: Gastroenterology | Admitting: Gastroenterology

## 2023-07-07 DIAGNOSIS — K5080 Crohn's disease of both small and large intestine without complications: Secondary | ICD-10-CM | POA: Insufficient documentation

## 2023-07-07 MED ORDER — VEDOLIZUMAB 300 MG IV SOLR
300.0000 mg | Freq: Once | INTRAVENOUS | Status: AC
  Administered 2023-07-07: 300 mg via INTRAVENOUS
  Filled 2023-07-07: qty 5

## 2023-07-07 NOTE — Telephone Encounter (Signed)
Inbound call from [place] with the [facility] inquiring if patient has a PA. Can be followed up at [phone]. Please advise.  Thank you

## 2023-07-07 NOTE — Telephone Encounter (Signed)
[place], please see secure chat regarding this patient. Thanks! Urgent PA needed for [facility]

## 2023-07-10 ENCOUNTER — Ambulatory Visit (INDEPENDENT_AMBULATORY_CARE_PROVIDER_SITE_OTHER): Payer: 59 | Admitting: Physician Assistant

## 2023-07-10 ENCOUNTER — Encounter: Payer: Self-pay | Admitting: Physician Assistant

## 2023-07-10 ENCOUNTER — Other Ambulatory Visit (INDEPENDENT_AMBULATORY_CARE_PROVIDER_SITE_OTHER): Payer: 59

## 2023-07-10 VITALS — BP 128/72 | HR 81 | Ht 66.0 in | Wt 219.0 lb

## 2023-07-10 DIAGNOSIS — K5080 Crohn's disease of both small and large intestine without complications: Secondary | ICD-10-CM | POA: Diagnosis not present

## 2023-07-10 DIAGNOSIS — G43809 Other migraine, not intractable, without status migrainosus: Secondary | ICD-10-CM

## 2023-07-10 LAB — CBC WITH DIFFERENTIAL/PLATELET
Basophils Absolute: 0 10*3/uL (ref 0.0–0.1)
Basophils Relative: 0.4 % (ref 0.0–3.0)
Eosinophils Absolute: 0.2 10*3/uL (ref 0.0–0.7)
Eosinophils Relative: 2.4 % (ref 0.0–5.0)
HCT: 43.1 % (ref 36.0–46.0)
Hemoglobin: 13.6 g/dL (ref 12.0–15.0)
Lymphocytes Relative: 24.7 % (ref 12.0–46.0)
Lymphs Abs: 1.9 10*3/uL (ref 0.7–4.0)
MCHC: 31.5 g/dL (ref 30.0–36.0)
MCV: 89.1 fl (ref 78.0–100.0)
Monocytes Absolute: 0.5 10*3/uL (ref 0.1–1.0)
Monocytes Relative: 6.3 % (ref 3.0–12.0)
Neutro Abs: 5.2 10*3/uL (ref 1.4–7.7)
Neutrophils Relative %: 66.2 % (ref 43.0–77.0)
Platelets: 280 10*3/uL (ref 150.0–400.0)
RBC: 4.84 Mil/uL (ref 3.87–5.11)
RDW: 15 % (ref 11.5–15.5)
WBC: 7.9 10*3/uL (ref 4.0–10.5)

## 2023-07-10 LAB — COMPREHENSIVE METABOLIC PANEL
ALT: 15 U/L (ref 0–35)
AST: 18 U/L (ref 0–37)
Albumin: 4 g/dL (ref 3.5–5.2)
Alkaline Phosphatase: 77 U/L (ref 39–117)
BUN: 6 mg/dL (ref 6–23)
CO2: 25 mEq/L (ref 19–32)
Calcium: 8.9 mg/dL (ref 8.4–10.5)
Chloride: 105 mEq/L (ref 96–112)
Creatinine, Ser: 0.67 mg/dL (ref 0.40–1.20)
GFR: 118.7 mL/min (ref >60.00)
Glucose, Bld: 86 mg/dL (ref 70–99)
Potassium: 3.7 mEq/L (ref 3.5–5.1)
Sodium: 138 mEq/L (ref 135–145)
Total Bilirubin: 0.4 mg/dL (ref 0.2–1.2)
Total Protein: 7.5 g/dL (ref 6.0–8.3)

## 2023-07-10 LAB — VITAMIN D 25 HYDROXY (VIT D DEFICIENCY, FRACTURES): VITD: 14.23 ng/mL — ABNORMAL LOW (ref 30.00–100.00)

## 2023-07-10 LAB — IBC + FERRITIN
Ferritin: 9.3 ng/mL — ABNORMAL LOW (ref 10.0–291.0)
Iron: 59 ug/dL (ref 42–145)
Saturation Ratios: 13.8 % — ABNORMAL LOW (ref 20.0–50.0)
TIBC: 428.4 ug/dL (ref 250.0–450.0)
Transferrin: 306 mg/dL (ref 212.0–360.0)

## 2023-07-10 MED ORDER — SUMATRIPTAN SUCCINATE 50 MG PO TABS: ORAL_TABLET | ORAL | 0 refills | Status: DC

## 2023-07-10 NOTE — Progress Notes (Signed)
Chief Complaint: Follow-up Crohn's disease  Crohn's history History of Crohns diagnosed in 2012. Ileocolonic disease. She had a surgery with ileal resection with divertiing ileostomy at age 27 for emergency surgery, she thinks due to a perforation although details of this and indication for it are unclear. She was hospitalized for diversion colitis in 2015. Ileostomy takedown was done in 2016. She had a post-operative colonoscopy in February 2016 showing mildly active disease, and was placed on Humira in March 2016. She developed antibody on this regimen, Humira was increased and started on . The antibody level went away but Humira level remained low despite optimal dosing. Started on [facility] November 2018. Follow up colonoscopy in 2019 looked normal. Subtherapeutic Entyvio levels 2023, increased to once monthly dosing.  HPI:    Mrs. Jessica [name] is a 27 year old African-American female with past medical history as listed below including Crohn's disease, osteoporosis and multiple others, known to Dr. Adela Lank, who presents to clinic today for follow-up of her Crohn's disease.    09/13/2022 patient seen via video visit for chronic disease.  History as above.  At that time patient had been subtherapeutic on Entyvio level of 3 with no antibodies, colonoscopy showed single ulcer at surgical anastomosis which was more consistent with anastomotic ulcer rather than active Crohn's.  The rest of her small intestine and colon were normal.  Her Entyvio was increased to once every 4 weeks and she was doing well.  She has declined pneumonia vaccine and COVID-vaccine in the past and has gotten flu shots.  She does have history of iron deficiency and vitamin D deficiency and is on supplements.  At that time continue Entyvio to every 4 weeks.  Repeated labs that time including CBC, iron studies, vitamin D, CMP.  She was using Bentyl as needed for occasional pain or loose stools.  Also occasional MiraLAX.  Discussed  possible MR enterography to evaluate her more proximal small bowel.    12/29/2022 enterography with no evidence of bowel obstruction or stricture, no evidence of acute small or large bowel inflammation.  Since last visit:    Today, the patient tells me that she has been off of her Entyvio over the past 6 months.  She has 3 children and 2 of them were in the hospital at the same time and then the other 1 had to have surgery thereafter.  Due to this she really had to put off her own health and seemed to be doing okay for a few months but now over the past 2 months, she has developed constipation similar to when she initially presented with Crohn's disease and feels like she just cannot have a bowel movement and then when she does it is diarrhea often.  She sees some blood in her stool over the past couple of weeks and has a decreased appetite due to always feeling full up.  Generalized abdominal discomfort.  She is still takes Dicyclomine occasionally which does help with cramping.  She would say that her symptoms are "bearable", she called our clinic and got set up for her [facility] Grayer again her first infusion was Friday, 07/07/2023, she does not really feel any different after it yet.  Also think she is lost about 10 pounds over this time.  Due to decrease in appetite.    Also tells me she had to cancel her PCP visit and needs some more Imitrex.    Denies fever, chills, nausea or vomiting.  Prior endoscopic evaluation: Colonoscopy 08/14/2018 - normal ileum, normal  anastomosis in the ascending colon, no inflammation Colonoscopy 2/16 - patent anastomosis with some inflammatory changes, ileum with some ulcerations EGD 2/16 - normal   Fecal calprotectin 07/21/2020 - normal (43)   Iron deficient 07/21/2020   Entyvio level 3.9, no antibodies on 01/24/22   Colonoscopy 02/02/22: Follow-up of Crohn's disease - history of prior ileal / right colon resection, on Entyvio with some abdominal cramping / loose stools and  history Uveitis recently. Entyvio level recently returned subtherapeutic   The perianal and digital rectal examinations were normal. - The mid ileum contained a single (solitary) ulcer, near what appeared to be a prior surgical anastomosis in the small bowel - The remainder of the exam in the terminal ileum was normal. The ileum was deeply intubated to eventually see the ulcer noted proximally. No other inflammatory changes noted. - There was evidence of a prior end-to-end ileo-colonic anastomosis in the ascending colon. This was patent and was characterized by healthy appearing mucosa. - A single small-mouthed diverticulum was found in the ascending colon. - Anal papilla(e) were hypertrophied. - The exam was otherwise without abnormality.      Past Medical History:  Diagnosis Date   Anemia    Crohn's disease (HCC)    Osteoporosis    Supervision of normal first pregnancy 05/03/2013   Uveitis    Vitamin D deficiency     Past Surgical History:  Procedure Laterality Date   COLONOSCOPY N/A 01/22/2015   Procedure: COLONOSCOPY;  Surgeon: Hart Carwin, MD;  Location: [facility];  Service: Endoscopy;  Laterality: N/A;   DILATION AND EVACUATION N/A 10/31/2014   Procedure: DILATATION AND EVACUATION;  Surgeon: Antionette Char, MD;  Location: [facility];  Service: Gynecology;  Laterality: N/A;   ESOPHAGOGASTRODUODENOSCOPY N/A 01/22/2015   Procedure: ESOPHAGOGASTRODUODENOSCOPY (EGD);  Surgeon: Hart Carwin, MD;  Location: [facility];  Service: Endoscopy;  Laterality: N/A;   FLEXIBLE SIGMOIDOSCOPY N/A 11/04/2014   Procedure: FLEXIBLE SIGMOIDOSCOPY;  Surgeon: Hart Carwin, MD;  Location: [facility] END[facility]COPY;  Service: Endoscopy;  Laterality: N/A;   ILEOSTOMY CLOSURE N/A 12/18/2014   Procedure: LOOP ILEOSTOMY REVERSAL;  Surgeon: Romie Levee, MD;  Location: [facility];  Service: General;  Laterality: N/A;   INCISION AND DRAINAGE Left    arm   LAPAROSCOPIC TUBAL LIGATION Bilateral 02/19/2020    Procedure: LAPAROSCOPIC TUBAL LIGATION;  Surgeon: Adam Phenix, MD;  Location: [facility];  Service: Gynecology;  Laterality: Bilateral;   SMALL INTESTINE SURGERY     WISDOM TOOTH EXTRACTION      Current Outpatient Medications  Medication Sig Dispense Refill   Cholecalciferol (VITAMIN D3) 125 MCG (5000 UT) CAPS Take 1 capsule (5,000 Units total) by mouth daily. 30 capsule 0   dicyclomine (BENTYL) 10 MG capsule Take 1 capsule (10 mg total) by mouth every 8 (eight) hours as needed (diarrhea/abdominal cramping). 30 capsule 1   ferrous sulfate 325 (65 FE) MG EC tablet Take 1 tablet (325 mg total) by mouth daily with breakfast. 30 tablet 0   ondansetron (ZOFRAN-ODT) 4 MG disintegrating tablet Take 1 tablet (4 mg total) by mouth every 8 (eight) hours as needed for nausea or vomiting. 20 tablet 0   sodium chloride 0.9 % SOLN 250 mL with vedolizumab 300 MG SOLR 300 mg Inject 300 mg into the vein every 28 (twenty-eight) days. Entyvio every 4 weeks at [facility] infusion center.     SUMAtriptan (IMITREX) 50 MG tablet TAKE ONE TABLET (50 MG) AT ONSET OF SYMPTOMS. MAY REPEAT IN 2  HOURS IF HEADACHE PERSISTS OR RECURS, FOR A TOTAL OF 100 MG. 9 tablet 2   No current facility-administered medications for this visit.    Allergies as of 07/10/2023   (No Known Allergies)    Family History  Problem Relation Age of Onset   Diabetes Father    Diabetes Maternal Grandmother    Heart disease Maternal Grandmother    Hypertension Maternal Grandmother    Kidney disease Maternal Grandmother    Lung cancer Maternal Grandfather    Hyperlipidemia Maternal Grandfather    Stroke Neg Hx    Colon cancer Neg Hx    Colon polyps Neg Hx    Esophageal cancer Neg Hx    Rectal cancer Neg Hx    Stomach cancer Neg Hx     Social History   Socioeconomic History   Marital status: Single    Spouse name: Not on file   Number of children: 3   Years of education: Not on file   Highest education level: Not on file   Occupational History   Occupation: homemaker  Tobacco Use   Smoking status: Never   Smokeless tobacco: Never   Tobacco comments:    occasional cigarette  Vaping Use   Vaping status: Never Used  Substance and Sexual Activity   Alcohol use: No    Alcohol/week: 0.0 standard drinks of alcohol   Drug use: No   Sexual activity: Yes    Partners: Male  Other Topics Concern   Not on file  Social History Narrative   Not on file   Social Determinants of Health   Financial Resource Strain: Not on file  Food Insecurity: Not on file  Transportation Needs: Not on file  Physical Activity: Not on file  Stress: Not on file  Social Connections: Not on file  Intimate Partner Violence: Not on file    Review of Systems:    Constitutional: No weight loss, fever or chills Cardiovascular: No chest pain Respiratory: No SOB  Gastrointestinal: See HPI and otherwise negative   Physical Exam:  Vital signs: BP 128/72   Pulse 81   Ht 5\' 6"  (1.676 m)   Wt 219 lb (99.3 kg)   SpO2 99%   BMI 35.35 kg/m    Constitutional:   Pleasant overweight AA female appears to be in NAD, Well developed, Well nourished, alert and cooperative Respiratory: Respirations even and unlabored. Lungs clear to auscultation bilaterally.   No wheezes, crackles, or rhonchi.  Cardiovascular: Normal S1, S2. No MRG. Regular rate and rhythm. No peripheral edema, cyanosis or pallor.  Gastrointestinal:  Soft, nondistended, mild generalized TTP no rebound or guarding. Normal bowel sounds. No appreciable masses or hepatomegaly. Rectal:  Not performed.  Psychiatric: Demonstrates good judgement and reason without abnormal affect or behaviors.  RELEVANT LABS AND IMAGING: CBC    Component Value Date/Time   WBC 7.3 09/28/2021 1047   RBC 4.57 09/28/2021 1047   HGB 12.5 09/28/2021 1047   HGB 10.1 (L) 10/16/2019 1420   HGB 11.0 01/23/2013 0000   HCT 39.6 09/28/2021 1047   HCT 32.5 (L) 10/16/2019 1420   HCT 33 01/23/2013 0000    PLT 308.0 09/28/2021 1047   PLT 271 10/16/2019 1420   PLT 226 01/23/2013 0000   MCV 86.6 09/28/2021 1047   MCV 77 (L) 10/16/2019 1420   MCH 24.3 (L) 10/18/2019 1448   MCHC 31.7 09/28/2021 1047   RDW 14.5 09/28/2021 1047   RDW 13.0 10/16/2019 1420   LYMPHSABS 1.9 09/28/2021 1047  LYMPHSABS 1.4 04/18/2019 1028   MONOABS 0.7 09/28/2021 1047   EOSABS 0.1 09/28/2021 1047   EOSABS 0.0 04/18/2019 1028   BASOSABS 0.0 09/28/2021 1047   BASOSABS 0.0 04/18/2019 1028    CMP     Component Value Date/Time   NA 137 09/28/2021 1047   NA 138 10/16/2019 1420   K 3.9 09/28/2021 1047   CL 104 09/28/2021 1047   CO2 27 09/28/2021 1047   GLUCOSE 91 09/28/2021 1047   BUN 9 09/28/2021 1047   BUN 6 10/16/2019 1420   CREATININE 0.64 09/28/2021 1047   CALCIUM 9.2 09/28/2021 1047   PROT 7.7 09/28/2021 1047   PROT 6.8 10/16/2019 1420   ALBUMIN 4.1 09/28/2021 1047   ALBUMIN 3.7 (L) 10/16/2019 1420   AST 20 09/28/2021 1047   ALT 21 09/28/2021 1047   ALKPHOS 71 09/28/2021 1047   BILITOT 0.4 09/28/2021 1047   BILITOT 0.3 10/16/2019 1420   GFRNONAA >60 10/19/2019 1655   GFRAA >60 10/19/2019 1655    Assessment: 1.  Crohn's disease: Unfortunately has been off of Entyvio over the past 6 months, first infusion back was 4 days ago, has been battling some constipation with rectal bleeding and generalized abdominal discomfort 2.  Migraines: Dr. Adela Lank previously prescribed Imitrex prior to her establishing care with PCP  Plan: 1.  Labs today to include CBC, CMP, vitamin D, iron 2.  Continue Entyvio on a every 4 week basis 3.  Recommend the patient start over-the-counter MiraLAX up to 4 times daily to help with constipation 4.  Continue Dicyclomine as needed 5.  Patient will call and let us know if the [facility] Grayer is not working for her over the next couple of weeks, may need to consider short steroid taper 6.  Refill Imitrex for 1 month.  Told her she needs to establish care with a PCP and I will not  refill any further. 7.  Patient to follow in clinic with Dr. Adela Lank in 3 to 4 months.  Hyacinth Meeker, PA-C [facility] 07/10/2023, 9:02 AM

## 2023-07-10 NOTE — Patient Instructions (Addendum)
Your provider has requested that you go to the basement level for lab work before leaving today. Press "B" on the elevator. The lab is located at the first door on the left as you exit the elevator.  We have sent the following medications to your pharmacy for you to pick up at your convenience: Imitrex.  Start Miralax 1 capful daily in 8 ounces of liquid.  _______________________________________________________  If your blood pressure at your visit was 140/90 or greater, please contact your primary care physician to follow up on this.  _______________________________________________________  If you are age 29 or older, your body mass index should be between 23-30. Your Body mass index is 35.35 kg/m. If this is out of the aforementioned range listed, please consider follow up with your Primary Care Provider.  If you are age 29 or younger, your body mass index should be between 19-25. Your Body mass index is 35.35 kg/m. If this is out of the aformentioned range listed, please consider follow up with your Primary Care Provider.   ________________________________________________________  The [facility] providers would like to encourage you to use [facility] to communicate with providers for non-urgent requests or questions.  Due to long hold times on the telephone, sending your provider a message by [facility] may be a faster and more efficient way to get a response.  Please allow 48 business hours for a response.  Please remember that this is for non-urgent requests.  _______________________________________________________

## 2023-07-10 NOTE — Progress Notes (Signed)
Agree with assessment and plan as outlined.  

## 2023-07-12 ENCOUNTER — Other Ambulatory Visit: Payer: Self-pay | Admitting: *Deleted

## 2023-07-12 DIAGNOSIS — E559 Vitamin D deficiency, unspecified: Secondary | ICD-10-CM

## 2023-07-12 DIAGNOSIS — K5080 Crohn's disease of both small and large intestine without complications: Secondary | ICD-10-CM

## 2023-07-12 DIAGNOSIS — D509 Iron deficiency anemia, unspecified: Secondary | ICD-10-CM

## 2023-09-11 ENCOUNTER — Telehealth: Payer: Self-pay

## 2023-09-11 ENCOUNTER — Other Ambulatory Visit: Payer: Self-pay

## 2023-09-11 DIAGNOSIS — K5080 Crohn's disease of both small and large intestine without complications: Secondary | ICD-10-CM

## 2023-09-11 NOTE — Telephone Encounter (Signed)
Please initiate PA for Entyvio 300 mg every 4 weeks if needed. Pt is scheduled for next infusion on 09/14/23. Thank you

## 2023-09-11 NOTE — Telephone Encounter (Signed)
Order entered in Epic for [place] every 4 weeks.

## 2023-09-14 ENCOUNTER — Inpatient Hospital Stay (HOSPITAL_COMMUNITY): Admission: RE | Admit: 2023-09-14 | Payer: 59 | Source: Ambulatory Visit

## 2023-09-15 ENCOUNTER — Other Ambulatory Visit (HOSPITAL_COMMUNITY): Payer: Self-pay

## 2023-09-15 ENCOUNTER — Telehealth: Payer: Self-pay | Admitting: Pharmacy Technician

## 2023-09-15 NOTE — Telephone Encounter (Signed)
Pharmacy Patient Advocate Encounter   Received notification from Physician's Office that prior authorization for ENTYVIO 300MG  is required/requested.   Insurance verification completed.   The patient is insured through [employer] .   Per test claim: PA required; PA submitted to [employer] via CoverMyMeds Key/confirmation #/EOC [id number] Status is pending

## 2023-09-15 NOTE — Telephone Encounter (Signed)
Prior Jessica [name] has been initiated per [employer]. See additional phone note dated 09/15/23.

## 2023-09-21 NOTE — Telephone Encounter (Signed)
Good Afternoon, Jessica [name]- Any news on PA for Entyvio yet?

## 2023-09-26 ENCOUNTER — Other Ambulatory Visit (HOSPITAL_COMMUNITY): Payer: Self-pay

## 2023-09-26 NOTE — Telephone Encounter (Signed)
See 09/15/23 telephone note. Entyvio every 4 weeks is approved through insurance.

## 2023-09-26 NOTE — Telephone Encounter (Signed)
Pharmacy Patient Advocate Encounter  Received notification from [employer] that Prior Authorization for ENTYVIO 300MG  has been APPROVED from 9.27.24 to 9.26.25   PA #/Case ID/Reference #: [id number]

## 2023-10-12 ENCOUNTER — Encounter (HOSPITAL_COMMUNITY): Payer: 59

## 2023-11-01 ENCOUNTER — Ambulatory Visit: Payer: 59 | Admitting: Gastroenterology

## 2023-11-01 NOTE — Progress Notes (Deleted)
HPI : 08/2022 Crohn's history History of Crohns diagnosed in 2012. Ileocolonic disease. She had a surgery with ileal resection with divertiing ileostomy at age 27 for emergency surgery, she thinks due to a perforation although details of this and indication for it are unclear. She was hospitalized for diversion colitis in 2015. Ileostomy takedown was done in 2016. She had a post-operative colonoscopy in February 2016 showing mildly active disease, and was placed on Humira in March 2016. She developed antibody on this regimen, Humira was increased and started on . The antibody level went away but Humira level remained low despite optimal dosing. Started on [facility] November 2018. Follow up colonoscopy in 2019 looked normal. Subtherapeutic Entyvio levels 2023, increased to once monthly dosing.     SINCE LAST VISIT: 27 year old female here for follow-up visit today.  I am connecting with her virtually due to issues with childcare and that she has a cold today.  Recall that back in February she had subtherapeutic Entyvio level of 3 with no antibodies.  Her colonoscopy showed single ulcer at surgical anastomosis I felt more consistent with an anastomotic ulcer rather than active Crohn's disease.  The rest of her small intestine and colon were normal.   We increased her Entyvio level to once every 4 weeks and she states that she felt really well on the regimen and felt that her Crohn's was well controlled.  She has missed her last 2 doses due to illness/sick contacts in her home.  Her child was hospitalized with an E. coli enteritis, she also felt that she had a viral gastroenteritis at 1 point time and so she is overdue for her next infusion.  She has a cold the right now but otherwise feels well.  Her weights been stable.  She has had some occasional pains in her abdomen but not severe.  Bentyl helps relieve this.  She had at 1 point some constipation that was bothering her but that has since resolved and  more so occasional loose stools at this point time.  She appears to tolerate the Entyvio well although continues to have some migraine headaches with intermittent frequency.  Recall I put her on some Elavil years ago for migraine headaches but this made her a bit drowsy and had a hard time tolerating it during the day.  She has not seen a primary care in some time and has no primary care currently.  We discussed her options for this.  She has never been on a triptan before.  She is not using any NSAIDs.  Otherwise generally feeling okay.   She historically declined the pneumonia vaccine and COVID-vaccine.  She has gotten flu shots. Last colonoscopy as below.  She does have a history of iron deficiency and vitamin D deficiency, taking both of these supplements currently.     Prior endoscopic evaluation: Colonoscopy 08/14/2018 - normal ileum, normal anastomosis in the ascending colon, no inflammation Colonoscopy 2/16 - patent anastomosis with some inflammatory changes, ileum with some ulcerations EGD 2/16 - normal   Fecal calprotectin 07/21/2020 - normal (43)   Iron deficient 07/21/2020   Entyvio level 3.9, no antibodies on 01/24/22   Colonoscopy 02/02/22: Follow-up of Crohn's disease - history of prior ileal / right colon resection, on Entyvio with some abdominal cramping / loose stools and history Uveitis recently. Entyvio level recently returned subtherapeutic   The perianal and digital rectal examinations were normal. - The mid ileum contained a single (solitary) ulcer, near what appeared to be  a prior surgical anastomosis in the small bowel - The remainder of the exam in the terminal ileum was normal. The ileum was deeply intubated to eventually see the ulcer noted proximally. No other inflammatory changes noted. - There was evidence of a prior end-to-end ileo-colonic anastomosis in the ascending colon. This was patent and was characterized by healthy appearing mucosa. - A single small-mouthed  diverticulum was found in the ascending colon. - Anal papilla(e) were hypertrophied. - The exam was otherwise without abnormality.    27 y.o. female here for assessment of the following   1. Crohn's disease of both small and large intestine without complication (HCC)   2. Iron deficiency anemia, unspecified iron deficiency anemia type   3. Vitamin D deficiency   4. Other migraine without status migrainosus, not intractable     As above, Entyvio increased every 4 weeks after her levels were subtherapeutic, she has been doing pretty well and mostly asymptomatic until she has missed her last 2 doses due to personal illness and illness of her child.  She is hoping to get this scheduled again soon.  We discussed her options for Crohn's treatment and Thompson Grayer is the least immunosuppressive.  She is otherwise feeling well at this time outside of a cold, she should go ahead and reschedule her Entyvio.  She is due for blood work at this time, we will bring her in for CBC, iron studies, vitamin D, c-Met, make sure her vitamin deficiencies have resolved with supplementation.  Generally I think her Crohn's has been well controlled.  She can use Bentyl as needed for occasional pains of her loose stools.  If she is constipated she can try MiraLAX as needed as well.  Moving forward, we will wait for her to be in a steady state after a few more Entyvio infusions, and then I think reasonable to do an MR enterography to evaluate her more proximal small bowel, we have not looked there in over 5 years and she has some intermittent mild pains.  Ensure no persistent inflammatory change or development of stricture etc.  She is agreeable with this.   Otherwise we discussed her headaches.  She really needs to have a primary care physician for her other healthcare needs and will refer her to [facility] primary care.  In the interim I discussed some options with her, we will give her a trial of Imitrex as needed for onset of  headache.  Hopefully this helps her in the interim.  She agrees.   PLAN: - resume Entyvio every 4 weeks. Counseled her on this as above, she will call infusion center to coordinate - lab for CBC, TIBC / ferritin, Vitamin D, CMET - MRE in 3-4 months - Bentyl PRN - Miralax PRN for constipation - add Imitrex 50mg  PRN for headaches - referral to [facility] primary care to establish   Last seen in July Today, the patient tells me that she has been off of her Entyvio over the past 6 months.  She has 3 children and 2 of them were in the hospital at the same time and then the other 1 had to have surgery thereafter.  Due to this she really had to put off her own health and seemed to be doing okay for a few months but now over the past 2 months, she has developed constipation similar to when she initially presented with Crohn's disease and feels like she just cannot have a bowel movement and then when she does it is  diarrhea often.  She sees some blood in her stool over the past couple of weeks and has a decreased appetite due to always feeling full up.  Generalized abdominal discomfort.  She is still takes Dicyclomine occasionally which does help with cramping.  She would say that her symptoms are "bearable", she called our clinic and got set up for her Thompson Grayer again her first infusion was Friday, 07/07/2023, she does not really feel any different after it yet.  Also think she is lost about 10 pounds over this time.  Due to decrease in appetite.       Past Medical History:  Diagnosis Date   Anemia    Crohn's disease (HCC)    Osteoporosis    Supervision of normal first pregnancy 05/03/2013   Uveitis    Vitamin D deficiency      Past Surgical History:  Procedure Laterality Date   COLONOSCOPY N/A 01/22/2015   Procedure: COLONOSCOPY;  Surgeon: Hart Carwin, MD;  Location: [facility];  Service: Endoscopy;  Laterality: N/A;   DILATION AND EVACUATION N/A 10/31/2014   Procedure: DILATATION AND EVACUATION;   Surgeon: Antionette Char, MD;  Location: [facility];  Service: Gynecology;  Laterality: N/A;   ESOPHAGOGASTRODUODENOSCOPY N/A 01/22/2015   Procedure: ESOPHAGOGASTRODUODENOSCOPY (EGD);  Surgeon: Hart Carwin, MD;  Location: [facility];  Service: Endoscopy;  Laterality: N/A;   FLEXIBLE SIGMOIDOSCOPY N/A 11/04/2014   Procedure: FLEXIBLE SIGMOIDOSCOPY;  Surgeon: Hart Carwin, MD;  Location: [facility];  Service: Endoscopy;  Laterality: N/A;   ILEOSTOMY CLOSURE N/A 12/18/2014   Procedure: LOOP ILEOSTOMY REVERSAL;  Surgeon: Romie Levee, MD;  Location: [facility];  Service: General;  Laterality: N/A;   INCISION AND DRAINAGE Left    arm   LAPAROSCOPIC TUBAL LIGATION Bilateral 02/19/2020   Procedure: LAPAROSCOPIC TUBAL LIGATION;  Surgeon: Adam Phenix, MD;  Location: [facility];  Service: Gynecology;  Laterality: Bilateral;   SMALL INTESTINE SURGERY     WISDOM TOOTH EXTRACTION     Family History  Problem Relation Age of Onset   Diabetes Father    Diabetes Maternal Grandmother    Heart disease Maternal Grandmother    Hypertension Maternal Grandmother    Kidney disease Maternal Grandmother    Lung cancer Maternal Grandfather    Hyperlipidemia Maternal Grandfather    Stroke Neg Hx    Colon cancer Neg Hx    Colon polyps Neg Hx    Esophageal cancer Neg Hx    Rectal cancer Neg Hx    Stomach cancer Neg Hx    Social History   Tobacco Use   Smoking status: Never   Smokeless tobacco: Never   Tobacco comments:    occasional cigarette  Vaping Use   Vaping status: Never Used  Substance Use Topics   Alcohol use: No    Alcohol/week: 0.0 standard drinks of alcohol   Drug use: No   Current Outpatient Medications  Medication Sig Dispense Refill   Cholecalciferol (VITAMIN D3) 125 MCG (5000 UT) CAPS Take 1 capsule (5,000 Units total) by mouth daily. 30 capsule 0   dicyclomine (BENTYL) 10 MG capsule Take 1 capsule (10 mg total) by mouth every 8 (eight) hours as needed  (diarrhea/abdominal cramping). 30 capsule 1   ferrous sulfate 325 (65 FE) MG EC tablet Take 1 tablet (325 mg total) by mouth daily with breakfast. 30 tablet 0   ondansetron (ZOFRAN-ODT) 4 MG disintegrating tablet Take 1 tablet (4 mg total) by mouth every 8 (eight) hours as  needed for nausea or vomiting. 20 tablet 0   sodium chloride 0.9 % SOLN 250 mL with vedolizumab 300 MG SOLR 300 mg Inject 300 mg into the vein every 28 (twenty-eight) days. Entyvio every 4 weeks at [facility] infusion center.     SUMAtriptan (IMITREX) 50 MG tablet Take one tablet (50 mg) at onset of symptoms. May repeat in 2 hours if headache persists or recurs, for a total of 100 mg. 30 tablet 0   No current facility-administered medications for this visit.   No Known Allergies   Review of Systems: All systems reviewed and negative except where noted in HPI.    No results found.  Physical Exam: There were no vitals taken for this visit. Constitutional: Pleasant,well-developed, ***female in no acute distress. HEENT: Normocephalic and atraumatic. Conjunctivae are normal. No scleral icterus. Neck supple.  Cardiovascular: Normal rate, regular rhythm.  Pulmonary/chest: Effort normal and breath sounds normal. No wheezing, rales or rhonchi. Abdominal: Soft, nondistended, nontender. Bowel sounds active throughout. There are no masses palpable. No hepatomegaly. Extremities: no edema Lymphadenopathy: No cervical adenopathy noted. Neurological: Alert and oriented to person place and time. Skin: Skin is warm and dry. No rashes noted. Psychiatric: Normal mood and affect. Behavior is normal.   ASSESSMENT: 27 y.o. female here for assessment of the following  No diagnosis found.  PLAN:   No ref. provider found

## 2023-11-02 ENCOUNTER — Ambulatory Visit (HOSPITAL_COMMUNITY)
Admission: RE | Admit: 2023-11-02 | Discharge: 2023-11-02 | Disposition: A | Discharge status: Home / Self Care | Payer: 59 | Source: Ambulatory Visit | Attending: Gastroenterology | Admitting: Gastroenterology

## 2023-11-02 DIAGNOSIS — K5080 Crohn's disease of both small and large intestine without complications: Secondary | ICD-10-CM | POA: Insufficient documentation

## 2023-11-02 MED ORDER — VEDOLIZUMAB 300 MG IV SOLR
300.0000 mg | INTRAVENOUS | Status: DC
  Administered 2023-11-02: 300 mg via INTRAVENOUS
  Filled 2023-11-02: qty 5

## 2023-11-30 ENCOUNTER — Encounter (HOSPITAL_COMMUNITY): Payer: 59

## 2023-12-08 ENCOUNTER — Encounter (HOSPITAL_COMMUNITY): Payer: 59

## 2023-12-28 ENCOUNTER — Encounter (HOSPITAL_COMMUNITY): Payer: 59

## 2024-01-05 ENCOUNTER — Encounter (HOSPITAL_COMMUNITY): Payer: 59

## 2024-01-11 ENCOUNTER — Inpatient Hospital Stay (HOSPITAL_COMMUNITY): Admission: RE | Admit: 2024-01-11 | Payer: 59 | Source: Ambulatory Visit

## 2024-01-18 ENCOUNTER — Encounter (HOSPITAL_COMMUNITY): Payer: 59

## 2024-02-08 ENCOUNTER — Encounter (HOSPITAL_COMMUNITY): Payer: 59

## 2024-03-05 ENCOUNTER — Emergency Department (HOSPITAL_COMMUNITY)
Admission: EM | Admit: 2024-03-05 | Discharge: 2024-03-05 | Disposition: A | Discharge status: Home / Self Care | Attending: Emergency Medicine | Admitting: Emergency Medicine

## 2024-03-05 ENCOUNTER — Other Ambulatory Visit: Payer: Self-pay

## 2024-03-05 DIAGNOSIS — J029 Acute pharyngitis, unspecified: Secondary | ICD-10-CM | POA: Diagnosis not present

## 2024-03-05 DIAGNOSIS — R109 Unspecified abdominal pain: Secondary | ICD-10-CM | POA: Insufficient documentation

## 2024-03-05 DIAGNOSIS — R197 Diarrhea, unspecified: Secondary | ICD-10-CM | POA: Insufficient documentation

## 2024-03-05 LAB — RESP PANEL BY RT-PCR (RSV, FLU A&B, COVID)  RVPGX2
Influenza A by PCR: NEGATIVE
Influenza B by PCR: NEGATIVE
Resp Syncytial Virus by PCR: NEGATIVE
SARS Coronavirus 2 by RT PCR: NEGATIVE

## 2024-03-05 LAB — GROUP A STREP BY PCR: Group A Strep by PCR: NOT DETECTED

## 2024-03-05 MED ORDER — DEXAMETHASONE 4 MG PO TABS
10.0000 mg | ORAL_TABLET | Freq: Once | ORAL | Status: AC
Start: 2024-03-05 — End: 2024-03-05
  Administered 2024-03-05: 10 mg via ORAL
  Filled 2024-03-05: qty 3

## 2024-03-05 MED ORDER — AMOXICILLIN-POT CLAVULANATE 875-125 MG PO TABS: 1.0000 | ORAL_TABLET | Freq: Two times a day (BID) | ORAL | 0 refills | Status: AC

## 2024-03-05 NOTE — ED Triage Notes (Signed)
 Pt. Stated, I've have some type of flu like symptoms for about a week. But its got worse with sore throat and ear pain.

## 2024-03-05 NOTE — ED Provider Notes (Signed)
 [place] EMERGENCY DEPARTMENT AT [facility] Provider Note   CSN: [id number] Arrival date & time: 03/05/24  0715     History  Chief Complaint  Patient presents with   Sore Throat   Otalgia    Jessica [name] is a 28 y.o. female.   Sore Throat  Otalgia  Patient presents with flulike symptoms for about a week.  Her symptoms include sore throat, congestion, and ear pain.  They have ultimately been worsening in severity.  She is mostly concerned because she is immunocompromised given she takes Entyvio for her ulcerative colitis.  She denies any fevers, nausea, vomiting.  She has had more diarrhea over this period.  No chest pain or shortness of breath.  Voice sounds more raspy but not really muffled.  No increased drooling.  Her mother had the same symptoms before her, but she has recovered fully by now.     Home Medications Prior to Admission medications   Medication Sig Start Date End Date Taking? Authorizing Provider  amoxicillin-clavulanate (AUGMENTIN) 875-125 MG tablet Take 1 tablet by mouth 2 (two) times daily for 7 days. 03/05/24 03/12/24 Yes Kalab Camps, Earvin Hansen, MD  Cholecalciferol (VITAMIN D3) 125 MCG (5000 UT) CAPS Take 1 capsule (5,000 Units total) by mouth daily. 02/03/22   Armbruster, Willaim Rayas, MD  dicyclomine (BENTYL) 10 MG capsule Take 1 capsule (10 mg total) by mouth every 8 (eight) hours as needed (diarrhea/abdominal cramping). 03/07/22   Armbruster, Willaim Rayas, MD  ferrous sulfate 325 (65 FE) MG EC tablet Take 1 tablet (325 mg total) by mouth daily with breakfast. 02/03/22   Armbruster, Willaim Rayas, MD  ondansetron (ZOFRAN-ODT) 4 MG disintegrating tablet Take 1 tablet (4 mg total) by mouth every 8 (eight) hours as needed for nausea or vomiting. 02/21/23   Jeanelle Malling, PA  sodium chloride 0.9 % SOLN 250 mL with vedolizumab 300 MG SOLR 300 mg Inject 300 mg into the vein every 28 (twenty-eight) days. Entyvio every 4 weeks at [facility] infusion center. 02/08/22   Armbruster, Willaim Rayas, MD   SUMAtriptan (IMITREX) 50 MG tablet Take one tablet (50 mg) at onset of symptoms. May repeat in 2 hours if headache persists or recurs, for a total of 100 mg. 07/10/23   Unk Lightning, PA      Allergies    Patient has no known allergies.    Review of Systems   Review of Systems  HENT:  Positive for ear pain.     Physical Exam Updated Vital Signs BP (!) 146/102 (BP Location: Right Arm)   Pulse (!) 102   Temp 99.7 F (37.6 C)   Resp 18   Ht 5\' 6"  (1.676 m)   Wt 90.7 kg   LMP 02/10/2024   SpO2 100%   BMI 32.28 kg/m  Physical Exam Constitutional:      General: She is not in acute distress.    Appearance: She is well-developed.  HENT:     Head: Normocephalic and atraumatic.     Right Ear: Tympanic membrane and ear canal normal.     Left Ear: Tympanic membrane and ear canal normal.     Nose: Congestion present.     Mouth/Throat:     Mouth: Mucous membranes are moist.     Pharynx: No oropharyngeal exudate.     Tonsils: 2+ on the right. 1+ on the left.  Eyes:     Conjunctiva/sclera: Conjunctivae normal.  Cardiovascular:     Rate and Rhythm: Normal rate and  regular rhythm.     Heart sounds: Normal heart sounds. No murmur heard. Pulmonary:     Effort: Pulmonary effort is normal.     Breath sounds: Wheezing present.  Abdominal:     Palpations: Abdomen is soft.     Tenderness: There is abdominal tenderness.  Musculoskeletal:     Cervical back: Normal range of motion.  Lymphadenopathy:     Cervical: No cervical adenopathy.  Skin:    General: Skin is warm and dry.     Capillary Refill: Capillary refill takes less than 2 seconds.  Neurological:     General: No focal deficit present.     Mental Status: She is alert and oriented to person, place, and time.  Psychiatric:        Mood and Affect: Mood normal.        Behavior: Behavior normal.     ED Results / Procedures / Treatments   Labs (all labs ordered are listed, but only abnormal results are  displayed) Labs Reviewed  RESP PANEL BY RT-PCR (RSV, FLU A&B, COVID)  [id number]  GROUP A STREP BY PCR    EKG None  Radiology No results found.  Procedures Procedures    Medications Ordered in ED Medications  dexamethasone (DECADRON) tablet 10 mg (has no administration in time range)    ED Course/ Medical Decision Making/ A&P                                 Medical Decision Making Risk Prescription drug management.   28 year old female with history of Crohn's disease on immunotherapy and microcytic anemia here with worsening flulike symptoms.  Vitals notable for hypertension and mild tachycardia with temperature of 99.7.  Reassuring SpO2 on room air.  Exam with bilateral tonsillar swelling without exudate and wheezes throughout.  Differential includes viral URI, strep throat, peritonsillar abscess, pneumonia.  Respiratory panel and strep swab collected.  While most likely viral in nature, given immunosuppression worsening symptoms, will send in Augmentin course.  Also give 1 dose of Decadron.  Safe for discharge. Follow-up with PCP.  Return if symptoms worsening before then.        Final Clinical Impression(s) / ED Diagnoses Final diagnoses:  Pharyngitis, unspecified etiology    Rx / DC Orders ED Discharge Orders          Ordered    amoxicillin-clavulanate (AUGMENTIN) 875-125 MG tablet  2 times daily        03/05/24 0853              Evette Georges, MD 03/05/24 0853    Melene Plan, DO 03/05/24 [phone]

## 2024-03-05 NOTE — Discharge Instructions (Addendum)
 You have pharyngitis which is inflammation of the throat. Because you are taking medicines that decrease your immune system, I have sent in antibiotics for you to take. We also gave you a dose of steroids here to reduce inflammation. Be sure to follow with a PCP (hotline in this packet) and return if symptoms are worsening before then.

## 2024-03-07 ENCOUNTER — Telehealth: Payer: Self-pay | Admitting: Physician Assistant

## 2024-03-07 NOTE — Telephone Encounter (Signed)
 Patient was seen in the ER 3/18 and diagnosed with pharyngitis. Although thought to likely be viral, patient was given Augmentin twice daily x 7 days due to immunosuppression and history of Crohns.  Patient calls asking if she can take Entyvio on 03/14/24 as scheduled. I have advised that if she is off antibiotics (last dose should be 03/12/24) and she is feeling well without continued symptoms, she may move forward with Entyvio infusion. She verbalizes understanding.

## 2024-03-07 NOTE — Telephone Encounter (Signed)
 Agree with your recommendation, thanks Dottie

## 2024-03-07 NOTE — Telephone Encounter (Signed)
 Patient called stated she was recently given antibiotics and would like to know if she can continue with her Entyvio appt. Also would like to know when she is to follow up with the office.

## 2024-03-10 ENCOUNTER — Emergency Department (HOSPITAL_COMMUNITY)
Admission: EM | Admit: 2024-03-10 | Discharge: 2024-03-10 | Disposition: A | Discharge status: Home / Self Care | Attending: Emergency Medicine | Admitting: Emergency Medicine

## 2024-03-10 ENCOUNTER — Emergency Department (HOSPITAL_COMMUNITY)

## 2024-03-10 ENCOUNTER — Encounter (HOSPITAL_COMMUNITY): Payer: Self-pay | Admitting: Emergency Medicine

## 2024-03-10 ENCOUNTER — Other Ambulatory Visit: Payer: Self-pay

## 2024-03-10 DIAGNOSIS — R509 Fever, unspecified: Secondary | ICD-10-CM | POA: Insufficient documentation

## 2024-03-10 DIAGNOSIS — J029 Acute pharyngitis, unspecified: Secondary | ICD-10-CM | POA: Diagnosis not present

## 2024-03-10 DIAGNOSIS — R059 Cough, unspecified: Secondary | ICD-10-CM | POA: Diagnosis not present

## 2024-03-10 DIAGNOSIS — D72829 Elevated white blood cell count, unspecified: Secondary | ICD-10-CM | POA: Diagnosis not present

## 2024-03-10 DIAGNOSIS — J3502 Chronic adenoiditis: Secondary | ICD-10-CM | POA: Diagnosis not present

## 2024-03-10 DIAGNOSIS — J039 Acute tonsillitis, unspecified: Secondary | ICD-10-CM | POA: Diagnosis not present

## 2024-03-10 LAB — BASIC METABOLIC PANEL
Anion gap: 12 (ref 5–15)
BUN: 8 mg/dL (ref 6–20)
CO2: 21 mmol/L — ABNORMAL LOW (ref 22–32)
Calcium: 8.8 mg/dL — ABNORMAL LOW (ref 8.9–10.3)
Chloride: 101 mmol/L (ref 98–111)
Creatinine, Ser: 0.64 mg/dL (ref 0.44–1.00)
GFR, Estimated: >60 mL/min (ref >60)
Glucose, Bld: 102 mg/dL — ABNORMAL HIGH (ref 70–99)
Potassium: 3.5 mmol/L (ref 3.5–5.1)
Sodium: 134 mmol/L — ABNORMAL LOW (ref 135–145)

## 2024-03-10 LAB — MONONUCLEOSIS SCREEN: Mono Screen: NEGATIVE

## 2024-03-10 LAB — CBC
HCT: 42.9 % (ref 36.0–46.0)
Hemoglobin: 14 g/dL (ref 12.0–15.0)
MCH: 29.2 pg (ref 26.0–34.0)
MCHC: 32.6 g/dL (ref 30.0–36.0)
MCV: 89.6 fL (ref 80.0–100.0)
Platelets: 291 10*3/uL (ref 150–400)
RBC: 4.79 MIL/uL (ref 3.87–5.11)
RDW: 13.2 % (ref 11.5–15.5)
WBC: 19.2 10*3/uL — ABNORMAL HIGH (ref 4.0–10.5)
nRBC: 0 % (ref 0.0–0.2)

## 2024-03-10 LAB — RESP PANEL BY RT-PCR (RSV, FLU A&B, COVID)  RVPGX2
Influenza A by PCR: NEGATIVE
Influenza B by PCR: NEGATIVE
Resp Syncytial Virus by PCR: NEGATIVE
SARS Coronavirus 2 by RT PCR: NEGATIVE

## 2024-03-10 LAB — HCG, QUANTITATIVE, PREGNANCY: hCG, Beta Chain, Quant, S: <1 m[IU]/mL (ref <5)

## 2024-03-10 LAB — GROUP A STREP BY PCR: Group A Strep by PCR: NOT DETECTED

## 2024-03-10 MED ORDER — PREDNISONE 20 MG PO TABS
40.0000 mg | ORAL_TABLET | Freq: Every day | ORAL | 0 refills | Status: AC
Start: 2024-03-10 — End: 2024-03-15

## 2024-03-10 MED ORDER — ACETAMINOPHEN 325 MG PO TABS
650.0000 mg | ORAL_TABLET | Freq: Once | ORAL | Status: AC | PRN
  Administered 2024-03-10: 650 mg via ORAL
  Filled 2024-03-10: qty 2

## 2024-03-10 MED ORDER — DEXAMETHASONE SODIUM PHOSPHATE 10 MG/ML IJ SOLN
10.0000 mg | Freq: Once | INTRAMUSCULAR | Status: AC
  Administered 2024-03-10: 10 mg via INTRAVENOUS
  Filled 2024-03-10: qty 1

## 2024-03-10 MED ORDER — KETOROLAC TROMETHAMINE 30 MG/ML IJ SOLN
30.0000 mg | Freq: Once | INTRAMUSCULAR | Status: AC
  Administered 2024-03-10: 30 mg via INTRAVENOUS
  Filled 2024-03-10: qty 1

## 2024-03-10 MED ORDER — SODIUM CHLORIDE 0.9 % IV BOLUS
1000.0000 mL | Freq: Once | INTRAVENOUS | Status: AC
Start: 2024-03-10 — End: 2024-03-10
  Administered 2024-03-10: 1000 mL via INTRAVENOUS

## 2024-03-10 MED ORDER — IOHEXOL 350 MG/ML SOLN
75.0000 mL | Freq: Once | INTRAVENOUS | Status: AC | PRN
  Administered 2024-03-10: 75 mL via INTRAVENOUS

## 2024-03-10 MED ORDER — LIDOCAINE VISCOUS HCL 2 % MT SOLN: 15.0000 mL | OROMUCOSAL | 0 refills | Status: DC | PRN

## 2024-03-10 MED ORDER — CLINDAMYCIN HCL 150 MG PO CAPS: 150.0000 mg | ORAL_CAPSULE | Freq: Four times a day (QID) | ORAL | 0 refills | Status: DC

## 2024-03-10 NOTE — ED Provider Notes (Signed)
 [place] EMERGENCY DEPARTMENT AT [facility] Provider Note   CSN: [id number] Arrival date & time: 03/10/24  0900     History  Chief Complaint  Patient presents with  . Cough  . Sore Throat  . Fever    Jessica [name] is a 28 y.o. female with history of Crohn's, anemia, osteoporosis, vitamin D deficiency, who presents the emergency department complaining of sore throat and cough.  Patient was recently seen for the same and put on some antibiotics.  She states that she was feeling well for a few days, but then started back with a fever.  She has been having trouble eating.  She has noticed "white spots" in the back of her throat. Can swallow but it is painful.    Cough Associated symptoms: fever and sore throat   Sore Throat  Fever Associated symptoms: cough and sore throat        Home Medications Prior to Admission medications   Medication Sig Start Date End Date Taking? Authorizing Provider  clindamycin (CLEOCIN) 150 MG capsule Take 1 capsule (150 mg total) by mouth every 6 (six) hours. 03/10/24  Yes Andoni Busch T, PA-C  lidocaine (XYLOCAINE) 2 % solution Use as directed 15 mLs in the mouth or throat as needed for mouth pain. 03/10/24  Yes Glennie Bose T, PA-C  predniSONE (DELTASONE) 20 MG tablet Take 2 tablets (40 mg total) by mouth daily with breakfast for 5 days. 03/10/24 03/15/24 Yes Jonpaul Lumm T, PA-C  amoxicillin-clavulanate (AUGMENTIN) 875-125 MG tablet Take 1 tablet by mouth 2 (two) times daily for 7 days. 03/05/24 03/12/24  Evette Georges, MD  Cholecalciferol (VITAMIN D3) 125 MCG (5000 UT) CAPS Take 1 capsule (5,000 Units total) by mouth daily. 02/03/22   Armbruster, Willaim Rayas, MD  dicyclomine (BENTYL) 10 MG capsule Take 1 capsule (10 mg total) by mouth every 8 (eight) hours as needed (diarrhea/abdominal cramping). 03/07/22   Armbruster, Willaim Rayas, MD  ferrous sulfate 325 (65 FE) MG EC tablet Take 1 tablet (325 mg total) by mouth daily with breakfast.  02/03/22   Armbruster, Willaim Rayas, MD  ondansetron (ZOFRAN-ODT) 4 MG disintegrating tablet Take 1 tablet (4 mg total) by mouth every 8 (eight) hours as needed for nausea or vomiting. 02/21/23   Jeanelle Malling, PA  sodium chloride 0.9 % SOLN 250 mL with vedolizumab 300 MG SOLR 300 mg Inject 300 mg into the vein every 28 (twenty-eight) days. Entyvio every 4 weeks at [facility] infusion center. 02/08/22   Armbruster, Willaim Rayas, MD  SUMAtriptan (IMITREX) 50 MG tablet Take one tablet (50 mg) at onset of symptoms. May repeat in 2 hours if headache persists or recurs, for a total of 100 mg. 07/10/23   Unk Lightning, PA      Allergies    Patient has no known allergies.    Review of Systems   Review of Systems  Constitutional:  Positive for fever.  HENT:  Positive for sore throat.   Respiratory:  Positive for cough.   All other systems reviewed and are negative.   Physical Exam Updated Vital Signs BP (!) 135/98   Pulse 79   Temp 98.3 F (36.8 C)   Resp 19   LMP 02/10/2024   SpO2 100%  Physical Exam Vitals and nursing note reviewed.  Constitutional:      Appearance: Normal appearance.  HENT:     Head: Normocephalic and atraumatic.     Mouth/Throat:     Lips: Pink.  Mouth: Mucous membranes are moist.     Pharynx: Posterior oropharyngeal erythema present.     Tonsils: Tonsillar exudate present. 3+ on the right. 2+ on the left.  Eyes:     Conjunctiva/sclera: Conjunctivae normal.  Cardiovascular:     Rate and Rhythm: Normal rate and regular rhythm.  Pulmonary:     Effort: Pulmonary effort is normal. No respiratory distress.     Breath sounds: Normal breath sounds.  Abdominal:     General: There is no distension.     Palpations: Abdomen is soft.     Tenderness: There is no abdominal tenderness.  Skin:    General: Skin is warm and dry.  Neurological:     General: No focal deficit present.     Mental Status: She is alert.    ED Results / Procedures / Treatments   Labs (all labs ordered  are listed, but only abnormal results are displayed) Labs Reviewed  CBC - Abnormal; Notable for the following components:      Result Value   WBC 19.2 (*)    All other components within normal limits  BASIC METABOLIC PANEL - Abnormal; Notable for the following components:   Sodium 134 (*)    CO2 21 (*)    Glucose, Bld 102 (*)    Calcium 8.8 (*)    All other components within normal limits  GROUP A STREP BY PCR  RESP PANEL BY RT-PCR (RSV, FLU A&B, COVID)  RVPGX2  HCG, QUANTITATIVE, PREGNANCY  MONONUCLEOSIS SCREEN    EKG None  Radiology CT Soft Tissue Neck W Contrast Result Date: 03/10/2024 CLINICAL DATA:  Epiglottitis or tonsillitis suspected. Cough and sore throat. Fever. EXAM: CT NECK WITH CONTRAST TECHNIQUE: Multidetector CT imaging of the neck was performed using the standard protocol following the bolus administration of intravenous contrast. RADIATION DOSE REDUCTION: This exam was performed according to the departmental dose-optimization program which includes automated exposure control, adjustment of the mA and/or kV according to patient size and/or use of iterative reconstruction technique. CONTRAST:  75mL OMNIPAQUE IOHEXOL 350 MG/ML SOLN COMPARISON:  None Available. FINDINGS: Pharynx and larynx: Marked hypertrophy and erythema is present in the adenoid tissue bilaterally. The palatine tonsils are enlarged and hyperemic, right greater than left. Lingual tonsils are enlarged. No focal mass lesion or fluid collection is present. Insert normal vallecular Aryepiglottic folds and piriform sinuses are clear. Vocal cords are midline and symmetric. Trachea is clear. Salivary glands: The submandibular and parotid glands and ducts are within normal limits. Thyroid: Normal Lymph nodes: Prominent level 2 lymph nodes are present bilaterally, left greater than right. No suppurative or hyperdense nodes are present. Vascular: No significant vascular abnormalities are present. Limited intracranial:  Within normal limits. Visualized orbits: The globes and orbits are within normal limits. Mastoids and visualized paranasal sinuses: The paranasal sinuses and mastoid air cells are clear. Skeleton: The vertebral body heights and alignment are normal. Straightening of the normal cervical lordosis is present. No focal osseous lesions are present. Upper chest: The lung apices are clear. The thoracic inlet is within normal limits. IMPRESSION: 1. Marked hypertrophy and erythema in the adenoid tissue bilaterally compatible with adenoiditis. 2. Enlarged and hyperemic palatine tonsils, right greater than left, compatible with tonsillitis. 3. No focal mass lesion or fluid collection is present. 4. Prominent level 2 lymph nodes bilaterally, left greater than right, are likely reactive. Electronically Signed   By: Marin Roberts M.D.   On: 03/10/2024 15:36   DG Chest 2 View Result Date:  03/10/2024 CLINICAL DATA:  Cough EXAM: CHEST - 2 VIEW COMPARISON:  03/24/2018 FINDINGS: Lateral view degraded by patient arm position. Midline trachea.  Normal heart size and mediastinal contours. Sharp costophrenic angles.  No pneumothorax.  Clear lungs. IMPRESSION: No active cardiopulmonary disease. Electronically Signed   By: Jeronimo Greaves M.D.   On: 03/10/2024 10:54    Procedures Procedures    Medications Ordered in ED Medications  acetaminophen (TYLENOL) tablet 650 mg (650 mg Oral Given 03/10/24 0956)  ketorolac (TORADOL) 30 MG/ML injection 30 mg (30 mg Intravenous Given 03/10/24 1156)  dexamethasone (DECADRON) injection 10 mg (10 mg Intravenous Given 03/10/24 1156)  sodium chloride 0.9 % bolus 1,000 mL (0 mLs Intravenous Stopped 03/10/24 1610)  iohexol (OMNIPAQUE) 350 MG/ML injection 75 mL (75 mLs Intravenous Contrast Given 03/10/24 1444)    ED Course/ Medical Decision Making/ A&P                                 Medical Decision Making Amount and/or Complexity of Data Reviewed Labs: ordered. Radiology:  ordered.  Risk OTC drugs. Prescription drug management.  This patient is a 28 y.o. female  who presents to the ED for concern of sore throat and fever.   Differential diagnoses prior to evaluation: The emergent differential diagnosis includes, but is not limited to,  Viral pharyngitis, strep pharyngitis, dental caries/abscess, esophagitis, sinusitis, post nasal drip, reflux, angioedema, RTA/PTA, Ludwig's angina. This is not an exhaustive differential.   Past Medical History / Co-morbidities / Social History: Crohn's, anemia, osteoporosis, vitamin D deficiency  Additional history: Chart reviewed. Pertinent results include: Reviewed ED visit note from 3/18, pt had negative testing but was started on augmentin due to immunocompromised state.  Physical Exam: Physical exam performed. The pertinent findings include: Febrile to 101.65F, tachycardic to 128.  Bilateral tonsillar swelling, right greater than left.  Lab Tests/Imaging studies: I personally interpreted labs/imaging and the pertinent results include:  WBC 19.2. BMP grossly unremarkable. Negative pregnancy.   Resp panel negative, Group A strep negative. Mononucleosis pending.   CXR without acute abnormalities. CT soft tissue neck with adenoiditis, tonsillitis, reactive lymph nodes. I agree with the radiologist interpretation.  Medications: Tylenol ordered in triage. I ordered medication including toradol, decadron.  I have reviewed the patients home medicines and have made adjustments as needed.   Disposition: After consideration of the diagnostic results and the patients response to treatment, I feel that emergency department workup does not suggest an emergent condition requiring admission or immediate intervention beyond what has been performed at this time. The plan is: discharge to home. No deep space abscess on CT. Concern for tonsillitis and adenoiditis, either viral or bacterial in etiology. Mono testing pending, plan to call  patient with results. Plan for steroids and lidocaine gargle, and will give printed prescription for clindamycin in case mono testing negative. If mono positive, will recommend stopping antibiotics and treating with supportive care. Patient has no airway compromise and is tolerating her own secretions, seems stable for discharge.  The patient has been instructed to return immediately for worsening symptoms, change in symptoms or any other concerns.  I discussed this case with my attending physician Dr. Jacqulyn Bath who cosigned this note including patient's presenting symptoms, physical exam, and planned diagnostics and interventions. Attending physician stated agreement with plan or made changes to plan which were implemented.   Final Clinical Impression(s) / ED Diagnoses Final diagnoses:  Tonsillitis  Adenoiditis  Sore throat    Rx / DC Orders ED Discharge Orders          Ordered    clindamycin (CLEOCIN) 150 MG capsule  Every 6 hours        03/10/24 1603    predniSONE (DELTASONE) 20 MG tablet  Daily with breakfast        03/10/24 1604    lidocaine (XYLOCAINE) 2 % solution  As needed        03/10/24 1604           Portions of this report may have been transcribed using voice recognition software. Every effort was made to ensure accuracy; however, inadvertent computerized transcription errors may be present.    Jeanella Flattery 03/10/24 1622    Maia Plan, MD 03/13/24 1357

## 2024-03-10 NOTE — Discharge Instructions (Addendum)
 You were seen in the emergency department today for sore throat and cough.  As we discussed the CT scan showed very swollen tonsils, but no deep pockets of infection.  Because your symptoms are so severe, I think it would be reasonable to change you to a stronger antibiotic.  We also tested you for mononucleosis.  I will call you later today with the results, and if this is the case, please stop the antibiotics.  I have sent a prescription for some steroids as well as lidocaine solution that you can gargle and spit out, and it provide some local numbing relief.  Continue to take Tylenol as needed for pain or fever.  Continue to monitor how you are doing and return to the ER for new or worsening symptoms.

## 2024-03-10 NOTE — ED Triage Notes (Addendum)
 Pt here for cough, sore throat.  Seen recently for same.  Hx of Chrons.  Pt felt ok for a couple of days but has been febrile for a couple of days.  Pt has trouble eating. Pt is febrile and tachy in triage. PT has white spots in back of throat.

## 2024-03-14 ENCOUNTER — Encounter (HOSPITAL_COMMUNITY)

## 2024-04-11 ENCOUNTER — Encounter (HOSPITAL_COMMUNITY)

## 2024-04-18 ENCOUNTER — Encounter (HOSPITAL_COMMUNITY)
Admission: RE | Admit: 2024-04-18 | Discharge: 2024-04-18 | Disposition: A | Discharge status: Home / Self Care | Source: Ambulatory Visit | Attending: Gastroenterology | Admitting: Gastroenterology

## 2024-04-18 ENCOUNTER — Telehealth: Payer: Self-pay | Admitting: Physician Assistant

## 2024-04-18 DIAGNOSIS — K5080 Crohn's disease of both small and large intestine without complications: Secondary | ICD-10-CM | POA: Insufficient documentation

## 2024-04-18 MED ORDER — VEDOLIZUMAB 300 MG IV SOLR
300.0000 mg | INTRAVENOUS | Status: DC
  Administered 2024-04-18: 300 mg via INTRAVENOUS
  Filled 2024-04-18: qty 5

## 2024-04-18 NOTE — Telephone Encounter (Signed)
 Left message for pt to call back. Pt scheduled to see Dr General Kenner 05/06/24@2 :30pm.

## 2024-04-18 NOTE — Telephone Encounter (Signed)
 Dr General Kenner pt- see question

## 2024-04-18 NOTE — Telephone Encounter (Signed)
 Thanks [employer]. Can you clarify how long she has been off Entyvio ? Nasopharyngitis can be a side effect of Entyvio , not sure if what she is dealing with his drug related or not and if it is something that may prohibit use of Entyvio  in the future. She needs an office visit with me to clarify next steps, not sure if I have anything open in the next 2 weeks. Can use a banding spot if any of those are held.  In the interim if her Crohn's is flaring could use budesonide  9mg  / day for 30 days, if needed. Thanks

## 2024-04-18 NOTE — Telephone Encounter (Signed)
 Inbound call from patient stating that she sent a Mychart message and wanted to make sure we seen the message. Patient states that she takes Entyvio  and has not been able to have her infusions  in a couple of months because dealing with tonsillitis and pharyngitis they put her on medications and it helped. Patient states she is having issues with flare ups and is requesting a call to discuss what her next steps are. Please advise.

## 2024-04-18 NOTE — Telephone Encounter (Signed)
 See note below. Pt advice request was also sent by pt yesterday.

## 2024-04-23 ENCOUNTER — Other Ambulatory Visit: Payer: Self-pay

## 2024-04-23 MED ORDER — BUDESONIDE 3 MG PO CPEP: 9.0000 mg | ORAL_CAPSULE | Freq: Every day | ORAL | 0 refills | Status: DC

## 2024-04-23 NOTE — Telephone Encounter (Signed)
Mychart message sent to pt   See mychart message

## 2024-04-25 ENCOUNTER — Encounter (HOSPITAL_COMMUNITY): Payer: Self-pay

## 2024-04-25 ENCOUNTER — Emergency Department (HOSPITAL_COMMUNITY)
Admission: EM | Admit: 2024-04-25 | Discharge: 2024-04-25 | Discharge status: Against Medical Advice | Attending: Emergency Medicine | Admitting: Emergency Medicine

## 2024-04-25 ENCOUNTER — Other Ambulatory Visit: Payer: Self-pay

## 2024-04-25 DIAGNOSIS — Z5321 Procedure and treatment not carried out due to patient leaving prior to being seen by health care provider: Secondary | ICD-10-CM | POA: Diagnosis not present

## 2024-04-25 DIAGNOSIS — R1110 Vomiting, unspecified: Secondary | ICD-10-CM | POA: Diagnosis not present

## 2024-04-25 DIAGNOSIS — K5900 Constipation, unspecified: Secondary | ICD-10-CM | POA: Insufficient documentation

## 2024-04-25 DIAGNOSIS — R109 Unspecified abdominal pain: Secondary | ICD-10-CM | POA: Diagnosis present

## 2024-04-25 LAB — COMPREHENSIVE METABOLIC PANEL WITH GFR
ALT: 18 U/L (ref 0–44)
AST: 26 U/L (ref 15–41)
Albumin: 3.8 g/dL (ref 3.5–5.0)
Alkaline Phosphatase: 67 U/L (ref 38–126)
Anion gap: 11 (ref 5–15)
BUN: 10 mg/dL (ref 6–20)
CO2: 23 mmol/L (ref 22–32)
Calcium: 9.8 mg/dL (ref 8.9–10.3)
Chloride: 102 mmol/L (ref 98–111)
Creatinine, Ser: 0.72 mg/dL (ref 0.44–1.00)
GFR, Estimated: >60 mL/min (ref >60)
Glucose, Bld: 101 mg/dL — ABNORMAL HIGH (ref 70–99)
Potassium: 3.7 mmol/L (ref 3.5–5.1)
Sodium: 136 mmol/L (ref 135–145)
Total Bilirubin: 0.8 mg/dL (ref 0.0–1.2)
Total Protein: 8.4 g/dL — ABNORMAL HIGH (ref 6.5–8.1)

## 2024-04-25 LAB — URINALYSIS, ROUTINE W REFLEX MICROSCOPIC
Bilirubin Urine: NEGATIVE
Glucose, UA: NEGATIVE mg/dL
Hgb urine dipstick: NEGATIVE
Ketones, ur: 5 mg/dL — AB
Nitrite: NEGATIVE
Protein, ur: 30 mg/dL — AB
Specific Gravity, Urine: 1.03 (ref 1.005–1.030)
WBC, UA: >50 WBC/hpf (ref 0–5)
pH: 6 (ref 5.0–8.0)

## 2024-04-25 LAB — CBC
HCT: 42.4 % (ref 36.0–46.0)
Hemoglobin: 13.4 g/dL (ref 12.0–15.0)
MCH: 28.6 pg (ref 26.0–34.0)
MCHC: 31.6 g/dL (ref 30.0–36.0)
MCV: 90.4 fL (ref 80.0–100.0)
Platelets: 542 10*3/uL — ABNORMAL HIGH (ref 150–400)
RBC: 4.69 MIL/uL (ref 3.87–5.11)
RDW: 13.7 % (ref 11.5–15.5)
WBC: 16.5 10*3/uL — ABNORMAL HIGH (ref 4.0–10.5)
nRBC: 0 % (ref 0.0–0.2)

## 2024-04-25 LAB — HCG, SERUM, QUALITATIVE: Preg, Serum: NEGATIVE

## 2024-04-25 LAB — LIPASE, BLOOD: Lipase: 83 U/L — ABNORMAL HIGH (ref 11–51)

## 2024-04-25 NOTE — ED Triage Notes (Signed)
 Pt c/o burning abd pain, constipation, vomiting since yesterday afternoon; denies fevers, denies urinary symptoms; hx chron's; taking tylenol  at home without relief

## 2024-04-25 NOTE — ED Notes (Signed)
Pt left due to wait time  

## 2024-04-26 ENCOUNTER — Other Ambulatory Visit: Payer: Self-pay

## 2024-04-26 ENCOUNTER — Inpatient Hospital Stay (HOSPITAL_COMMUNITY): Admitting: Certified Registered Nurse Anesthetist

## 2024-04-26 ENCOUNTER — Inpatient Hospital Stay (HOSPITAL_COMMUNITY)
Admission: EM | Admit: 2024-04-26 | Discharge: 2024-05-09 | DRG: 336 | Disposition: A | Discharge status: Home / Self Care | Attending: Internal Medicine | Admitting: Internal Medicine

## 2024-04-26 ENCOUNTER — Encounter (HOSPITAL_COMMUNITY)
Admission: EM | Disposition: A | Discharge status: Home / Self Care | Payer: Self-pay | Source: Home / Self Care | Attending: Internal Medicine

## 2024-04-26 ENCOUNTER — Emergency Department (HOSPITAL_COMMUNITY)

## 2024-04-26 ENCOUNTER — Encounter (HOSPITAL_COMMUNITY): Payer: Self-pay | Admitting: Emergency Medicine

## 2024-04-26 DIAGNOSIS — K50912 Crohn's disease, unspecified, with intestinal obstruction: Secondary | ICD-10-CM

## 2024-04-26 DIAGNOSIS — K760 Fatty (change of) liver, not elsewhere classified: Secondary | ICD-10-CM | POA: Diagnosis not present

## 2024-04-26 DIAGNOSIS — Z833 Family history of diabetes mellitus: Secondary | ICD-10-CM

## 2024-04-26 DIAGNOSIS — K565 Intestinal adhesions [bands], unspecified as to partial versus complete obstruction: Secondary | ICD-10-CM | POA: Diagnosis not present

## 2024-04-26 DIAGNOSIS — K9189 Other postprocedural complications and disorders of digestive system: Secondary | ICD-10-CM | POA: Diagnosis not present

## 2024-04-26 DIAGNOSIS — Z6832 Body mass index (BMI) 32.0-32.9, adult: Secondary | ICD-10-CM | POA: Diagnosis not present

## 2024-04-26 DIAGNOSIS — K50812 Crohn's disease of both small and large intestine with intestinal obstruction: Principal | ICD-10-CM | POA: Diagnosis present

## 2024-04-26 DIAGNOSIS — N281 Cyst of kidney, acquired: Secondary | ICD-10-CM | POA: Diagnosis not present

## 2024-04-26 DIAGNOSIS — Z83438 Family history of other disorder of lipoprotein metabolism and other lipidemia: Secondary | ICD-10-CM

## 2024-04-26 DIAGNOSIS — K567 Ileus, unspecified: Secondary | ICD-10-CM | POA: Diagnosis not present

## 2024-04-26 DIAGNOSIS — J039 Acute tonsillitis, unspecified: Secondary | ICD-10-CM | POA: Diagnosis present

## 2024-04-26 DIAGNOSIS — F17210 Nicotine dependence, cigarettes, uncomplicated: Secondary | ICD-10-CM | POA: Diagnosis not present

## 2024-04-26 DIAGNOSIS — K56609 Unspecified intestinal obstruction, unspecified as to partial versus complete obstruction: Principal | ICD-10-CM

## 2024-04-26 DIAGNOSIS — R109 Unspecified abdominal pain: Secondary | ICD-10-CM | POA: Diagnosis not present

## 2024-04-26 DIAGNOSIS — Z8249 Family history of ischemic heart disease and other diseases of the circulatory system: Secondary | ICD-10-CM | POA: Diagnosis not present

## 2024-04-26 DIAGNOSIS — K449 Diaphragmatic hernia without obstruction or gangrene: Secondary | ICD-10-CM | POA: Diagnosis not present

## 2024-04-26 DIAGNOSIS — K5651 Intestinal adhesions [bands], with partial obstruction: Secondary | ICD-10-CM | POA: Diagnosis not present

## 2024-04-26 DIAGNOSIS — K5090 Crohn's disease, unspecified, without complications: Secondary | ICD-10-CM | POA: Diagnosis not present

## 2024-04-26 DIAGNOSIS — E66811 Obesity, class 1: Secondary | ICD-10-CM | POA: Diagnosis present

## 2024-04-26 HISTORY — PX: LAPAROTOMY: SHX154

## 2024-04-26 LAB — COMPREHENSIVE METABOLIC PANEL WITH GFR
ALT: 17 U/L (ref 0–44)
AST: 21 U/L (ref 15–41)
Albumin: 3.8 g/dL (ref 3.5–5.0)
Alkaline Phosphatase: 74 U/L (ref 38–126)
Anion gap: 10 (ref 5–15)
BUN: 9 mg/dL (ref 6–20)
CO2: 25 mmol/L (ref 22–32)
Calcium: 9.6 mg/dL (ref 8.9–10.3)
Chloride: 101 mmol/L (ref 98–111)
Creatinine, Ser: 0.8 mg/dL (ref 0.44–1.00)
GFR, Estimated: >60 mL/min (ref >60)
Glucose, Bld: 90 mg/dL (ref 70–99)
Potassium: 3.6 mmol/L (ref 3.5–5.1)
Sodium: 136 mmol/L (ref 135–145)
Total Bilirubin: 0.9 mg/dL (ref 0.0–1.2)
Total Protein: 8.7 g/dL — ABNORMAL HIGH (ref 6.5–8.1)

## 2024-04-26 LAB — CBC WITH DIFFERENTIAL/PLATELET
Abs Immature Granulocytes: 0.07 10*3/uL (ref 0.00–0.07)
Basophils Absolute: 0 10*3/uL (ref 0.0–0.1)
Basophils Relative: 0 %
Eosinophils Absolute: 0.1 10*3/uL (ref 0.0–0.5)
Eosinophils Relative: 0 %
HCT: 44.5 % (ref 36.0–46.0)
Hemoglobin: 14.3 g/dL (ref 12.0–15.0)
Immature Granulocytes: 0 %
Lymphocytes Relative: 12 %
Lymphs Abs: 2.1 10*3/uL (ref 0.7–4.0)
MCH: 28.8 pg (ref 26.0–34.0)
MCHC: 32.1 g/dL (ref 30.0–36.0)
MCV: 89.7 fL (ref 80.0–100.0)
Monocytes Absolute: 0.8 10*3/uL (ref 0.1–1.0)
Monocytes Relative: 4 %
Neutro Abs: 14.6 10*3/uL — ABNORMAL HIGH (ref 1.7–7.7)
Neutrophils Relative %: 84 %
Platelets: 484 10*3/uL — ABNORMAL HIGH (ref 150–400)
RBC: 4.96 MIL/uL (ref 3.87–5.11)
RDW: 13.6 % (ref 11.5–15.5)
WBC: 17.6 10*3/uL — ABNORMAL HIGH (ref 4.0–10.5)
nRBC: 0 % (ref 0.0–0.2)

## 2024-04-26 LAB — LIPASE, BLOOD: Lipase: 62 U/L — ABNORMAL HIGH (ref 11–51)

## 2024-04-26 SURGERY — LAPAROTOMY, EXPLORATORY
Anesthesia: General | Site: Abdomen

## 2024-04-26 MED ORDER — ACETAMINOPHEN 650 MG RE SUPP: 650.0000 mg | Freq: Four times a day (QID) | RECTAL | Status: DC | PRN

## 2024-04-26 MED ORDER — LACTATED RINGERS IV SOLN: INTRAVENOUS | Status: DC

## 2024-04-26 MED ORDER — FENTANYL CITRATE (PF) 100 MCG/2ML IJ SOLN
INTRAMUSCULAR | Status: DC | PRN
  Administered 2024-04-26: 50 ug via INTRAVENOUS
  Administered 2024-04-26 (×2): 100 ug via INTRAVENOUS

## 2024-04-26 MED ORDER — LACTATED RINGERS IV SOLN: INTRAVENOUS | Status: DC | PRN

## 2024-04-26 MED ORDER — DEXAMETHASONE SODIUM PHOSPHATE 10 MG/ML IJ SOLN
INTRAMUSCULAR | Status: AC
  Filled 2024-04-26: qty 1

## 2024-04-26 MED ORDER — SUCCINYLCHOLINE CHLORIDE 200 MG/10ML IV SOSY
PREFILLED_SYRINGE | INTRAVENOUS | Status: DC | PRN
  Administered 2024-04-26: 100 mg via INTRAVENOUS

## 2024-04-26 MED ORDER — 0.9 % SODIUM CHLORIDE (POUR BTL) OPTIME
TOPICAL | Status: DC | PRN
  Administered 2024-04-26: 2000 mL

## 2024-04-26 MED ORDER — MENTHOL 3 MG MT LOZG
1.0000 | LOZENGE | OROMUCOSAL | Status: DC | PRN
  Administered 2024-04-27 – 2024-05-06 (×3): 3 mg via ORAL
  Filled 2024-04-26 (×4): qty 9

## 2024-04-26 MED ORDER — SUGAMMADEX SODIUM 200 MG/2ML IV SOLN
INTRAVENOUS | Status: DC | PRN
  Administered 2024-04-26: 400 mg via INTRAVENOUS

## 2024-04-26 MED ORDER — SODIUM CHLORIDE 0.9 % IV SOLN
INTRAVENOUS | Status: AC
  Filled 2024-04-26: qty 2

## 2024-04-26 MED ORDER — OXYCODONE HCL 5 MG PO TABS: 5.0000 mg | ORAL_TABLET | Freq: Once | ORAL | Status: DC | PRN

## 2024-04-26 MED ORDER — PHENYLEPHRINE 80 MCG/ML (10ML) SYRINGE FOR IV PUSH (FOR BLOOD PRESSURE SUPPORT)
PREFILLED_SYRINGE | INTRAVENOUS | Status: AC
  Filled 2024-04-26: qty 10

## 2024-04-26 MED ORDER — LIDOCAINE 2% (20 MG/ML) 5 ML SYRINGE
INTRAMUSCULAR | Status: AC
  Filled 2024-04-26: qty 5

## 2024-04-26 MED ORDER — HYDROMORPHONE HCL 1 MG/ML IJ SOLN
0.5000 mg | INTRAMUSCULAR | Status: DC | PRN
  Administered 2024-04-27 – 2024-05-01 (×29): 1 mg via INTRAVENOUS
  Filled 2024-04-26 (×30): qty 1

## 2024-04-26 MED ORDER — OXYCODONE HCL 5 MG/5ML PO SOLN: 5.0000 mg | Freq: Once | ORAL | Status: DC | PRN

## 2024-04-26 MED ORDER — FENTANYL CITRATE (PF) 250 MCG/5ML IJ SOLN
INTRAMUSCULAR | Status: AC
  Filled 2024-04-26: qty 5

## 2024-04-26 MED ORDER — LIDOCAINE HCL (CARDIAC) PF 100 MG/5ML IV SOSY
PREFILLED_SYRINGE | INTRAVENOUS | Status: DC | PRN
  Administered 2024-04-26: 100 mg via INTRAVENOUS

## 2024-04-26 MED ORDER — ONDANSETRON HCL 4 MG/2ML IJ SOLN
4.0000 mg | Freq: Once | INTRAMUSCULAR | Status: AC
  Administered 2024-04-26: 4 mg via INTRAVENOUS
  Filled 2024-04-26: qty 2

## 2024-04-26 MED ORDER — FENTANYL CITRATE (PF) 100 MCG/2ML IJ SOLN
25.0000 ug | INTRAMUSCULAR | Status: DC | PRN
  Administered 2024-04-27 (×2): 50 ug via INTRAVENOUS

## 2024-04-26 MED ORDER — IOHEXOL 350 MG/ML SOLN
75.0000 mL | Freq: Once | INTRAVENOUS | Status: AC | PRN
  Administered 2024-04-26: 75 mL via INTRAVENOUS

## 2024-04-26 MED ORDER — MIDAZOLAM HCL 2 MG/2ML IJ SOLN
INTRAMUSCULAR | Status: AC
  Filled 2024-04-26: qty 2

## 2024-04-26 MED ORDER — SUCCINYLCHOLINE CHLORIDE 200 MG/10ML IV SOSY
PREFILLED_SYRINGE | INTRAVENOUS | Status: AC
  Filled 2024-04-26: qty 10

## 2024-04-26 MED ORDER — PROPOFOL 10 MG/ML IV BOLUS
INTRAVENOUS | Status: AC
  Filled 2024-04-26: qty 20

## 2024-04-26 MED ORDER — HYDROMORPHONE HCL 1 MG/ML IJ SOLN
INTRAMUSCULAR | Status: DC | PRN
  Administered 2024-04-26: .5 mg via INTRAVENOUS

## 2024-04-26 MED ORDER — DEXMEDETOMIDINE HCL IN NACL 80 MCG/20ML IV SOLN
INTRAVENOUS | Status: DC | PRN
  Administered 2024-04-26: 12 ug via INTRAVENOUS
  Administered 2024-04-26: 8 ug via INTRAVENOUS

## 2024-04-26 MED ORDER — ACETAMINOPHEN 10 MG/ML IV SOLN: 1000.0000 mg | Freq: Once | INTRAVENOUS | Status: DC | PRN

## 2024-04-26 MED ORDER — ONDANSETRON HCL 4 MG/2ML IJ SOLN
4.0000 mg | Freq: Four times a day (QID) | INTRAMUSCULAR | Status: DC | PRN
  Administered 2024-04-27 – 2024-05-09 (×12): 4 mg via INTRAVENOUS
  Filled 2024-04-26 (×13): qty 2

## 2024-04-26 MED ORDER — ACETAMINOPHEN 10 MG/ML IV SOLN
INTRAVENOUS | Status: AC
  Filled 2024-04-26: qty 100

## 2024-04-26 MED ORDER — DEXAMETHASONE SODIUM PHOSPHATE 10 MG/ML IJ SOLN
INTRAMUSCULAR | Status: DC | PRN
  Administered 2024-04-26: 10 mg via INTRAVENOUS

## 2024-04-26 MED ORDER — HYDROMORPHONE HCL 1 MG/ML IJ SOLN
INTRAMUSCULAR | Status: AC
  Filled 2024-04-26: qty 0.5

## 2024-04-26 MED ORDER — MORPHINE SULFATE (PF) 4 MG/ML IV SOLN
6.0000 mg | Freq: Once | INTRAVENOUS | Status: AC
  Administered 2024-04-26: 6 mg via INTRAVENOUS
  Filled 2024-04-26: qty 2

## 2024-04-26 MED ORDER — MIDAZOLAM HCL 5 MG/5ML IJ SOLN
INTRAMUSCULAR | Status: DC | PRN
  Administered 2024-04-26: 2 mg via INTRAVENOUS

## 2024-04-26 MED ORDER — ONDANSETRON HCL 4 MG/2ML IJ SOLN
INTRAMUSCULAR | Status: AC
  Filled 2024-04-26: qty 2

## 2024-04-26 MED ORDER — MORPHINE SULFATE (PF) 4 MG/ML IV SOLN
4.0000 mg | Freq: Once | INTRAVENOUS | Status: AC
  Administered 2024-04-26: 4 mg via INTRAVENOUS
  Filled 2024-04-26: qty 1

## 2024-04-26 MED ORDER — SODIUM CHLORIDE 0.9 % IV SOLN
2.0000 g | INTRAVENOUS | Status: AC
  Administered 2024-04-26: 2 g via INTRAVENOUS

## 2024-04-26 MED ORDER — ONDANSETRON HCL 4 MG/2ML IJ SOLN
INTRAMUSCULAR | Status: DC | PRN
  Administered 2024-04-26: 4 mg via INTRAVENOUS

## 2024-04-26 MED ORDER — ACETAMINOPHEN 10 MG/ML IV SOLN
INTRAVENOUS | Status: DC | PRN
  Administered 2024-04-26: 1000 mg via INTRAVENOUS

## 2024-04-26 MED ORDER — PROPOFOL 10 MG/ML IV BOLUS
INTRAVENOUS | Status: DC | PRN
  Administered 2024-04-26: 200 mg via INTRAVENOUS

## 2024-04-26 MED ORDER — PHENYLEPHRINE HCL (PRESSORS) 10 MG/ML IV SOLN
INTRAVENOUS | Status: DC | PRN
  Administered 2024-04-26 (×4): 160 ug via INTRAVENOUS

## 2024-04-26 MED ORDER — LACTATED RINGERS IV BOLUS
1000.0000 mL | Freq: Once | INTRAVENOUS | Status: AC
  Administered 2024-04-26: 1000 mL via INTRAVENOUS

## 2024-04-26 MED ORDER — ROCURONIUM BROMIDE 100 MG/10ML IV SOLN
INTRAVENOUS | Status: DC | PRN
  Administered 2024-04-26: 70 mg via INTRAVENOUS

## 2024-04-26 MED ORDER — MORPHINE SULFATE (PF) 2 MG/ML IV SOLN
2.0000 mg | INTRAVENOUS | Status: DC | PRN
  Administered 2024-04-27 (×2): 2 mg via INTRAVENOUS
  Filled 2024-04-26 (×2): qty 1

## 2024-04-26 MED ORDER — DROPERIDOL 2.5 MG/ML IJ SOLN: 0.6250 mg | Freq: Once | INTRAMUSCULAR | Status: DC | PRN

## 2024-04-26 SURGICAL SUPPLY — 30 items
BAG COUNTER SPONGE SURGICOUNT (BAG) ×1 IMPLANT
CHLORAPREP W/TINT 26 (MISCELLANEOUS) ×1 IMPLANT
COVER SURGICAL LIGHT HANDLE (MISCELLANEOUS) ×1 IMPLANT
DRAPE LAPAROSCOPIC ABDOMINAL (DRAPES) ×1 IMPLANT
DRAPE WARM FLUID 44X44 (DRAPES) ×1 IMPLANT
DRSG OPSITE POSTOP 4X10 (GAUZE/BANDAGES/DRESSINGS) IMPLANT
DRSG OPSITE POSTOP 4X8 (GAUZE/BANDAGES/DRESSINGS) IMPLANT
ELECT BLADE 6.5 EXT (BLADE) IMPLANT
ELECT CAUTERY BLADE 6.4 (BLADE) ×1 IMPLANT
ELECTRODE REM PT RTRN 9FT ADLT (ELECTROSURGICAL) ×1 IMPLANT
GLOVE BIOGEL PI IND STRL 7.0 (GLOVE) ×1 IMPLANT
GLOVE SURG SS PI 7.0 STRL IVOR (GLOVE) ×1 IMPLANT
GOWN STRL REUS W/ TWL LRG LVL3 (GOWN DISPOSABLE) ×2 IMPLANT
HANDLE SUCTION POOLE (INSTRUMENTS) ×1 IMPLANT
KIT BASIN OR (CUSTOM PROCEDURE TRAY) ×1 IMPLANT
LIGASURE IMPACT 36 18CM CVD LR (INSTRUMENTS) IMPLANT
PACK GENERAL/GYN (CUSTOM PROCEDURE TRAY) ×1 IMPLANT
PENCIL SMOKE EVACUATOR (MISCELLANEOUS) ×1 IMPLANT
SPONGE T-LAP 18X18 ~~LOC~~+RFID (SPONGE) IMPLANT
STAPLER VISISTAT 35W (STAPLE) ×1 IMPLANT
SUT PDS AB 0 CTX 36 PDP370T (SUTURE) IMPLANT
SUT PDS AB 3-0 SH 27 (SUTURE) ×2 IMPLANT
SUT SILK 2 0 SH CR/8 (SUTURE) ×1 IMPLANT
SUT SILK 2 0 TIES 10X30 (SUTURE) ×1 IMPLANT
SUT SILK 3 0 SH CR/8 (SUTURE) ×1 IMPLANT
SUT SILK 3 0 TIES 10X30 (SUTURE) ×1 IMPLANT
TOWEL GREEN STERILE (TOWEL DISPOSABLE) ×1 IMPLANT
TRAY FOLEY MTR SLVR 14FR STAT (SET/KITS/TRAYS/PACK) IMPLANT
TRAY FOLEY MTR SLVR 16FR STAT (SET/KITS/TRAYS/PACK) ×1 IMPLANT
YANKAUER SUCT BULB TIP NO VENT (SUCTIONS) IMPLANT

## 2024-04-26 NOTE — H&P (Addendum)
 History and Physical    Jessica [name] NWG:[id number] DOB: [birth year] DOA: 04/26/2024  PCP: Patient, No Pcp Per  Patient coming from: Home  I have personally briefly reviewed patient's old medical records in [facility]  Chief Complaint: Abdominal pain, nausea, vomiting  HPI: Jessica [name] is a 28 y.o. female with medical history significant for Crohn's ileocolitis (s/p remote ileal resection) on Entyvio  who presented to the ED for evaluation of abdominal pain, nausea, and vomiting.  Patient reports developing generalized abdominal pain associated with frequent nausea and vomiting 2 days ago.  She has not been able to maintain adequate oral intake.  Her last bowel movement was also 2-3 days ago.  Prior to that she had firm stools without diarrhea.  She has not had any fevers, chills, diaphoresis.  She follows with [facility] and has been on Entyvio , last given on 5/1.  She called the GI clinic with her symptoms and was prescribed budesonide  which she has obtained but has not been able to take now due to her nausea and vomiting.  ED Course  Labs/Imaging on admission: I have personally reviewed following labs and imaging studies.  Initial vitals showed BP 148/107, pulse 86, RR 16, temp 98.0 F, SpO2 100% on room air.  Labs showed WBC 17.6, hemoglobin 14.3, platelets 484, sodium 136, potassium 3.6, bicarb 25, BUN 9, creatinine 0.80, serum glucose 90, LFTs within normal limits, lipase 62.  CT abdomen/pelvis with contrast showed findings consistent with partial SBO with transition zone seen within the mid right abdomen at an area of surgically anastomosis large and small bowel.  Small hiatal hernia, hepatic steatosis, small amount of pelvic free fluid noted.  Patient was given 1 L LR, IV morphine  4 mg, Zofran .  EDP spoke with on-call for general surgery who recommended medical admission and they will see patient in consultation.  The hospitalist service was consulted to admit.  Review  of Systems: All systems reviewed and are negative except as documented in history of present illness above.   Past Medical History:  Diagnosis Date   Anemia    Crohn's disease (HCC)    Osteoporosis    Supervision of normal first pregnancy 05/03/2013   Uveitis    Vitamin D  deficiency     Past Surgical History:  Procedure Laterality Date   COLONOSCOPY N/A 01/22/2015   Procedure: COLONOSCOPY;  Surgeon: Pietro Bridegroom, MD;  Location: [facility];  Service: Endoscopy;  Laterality: N/A;   DILATION AND EVACUATION N/A 10/31/2014   Procedure: DILATATION AND EVACUATION;  Surgeon: Abdul Hodgkin, MD;  Location: [facility];  Service: Gynecology;  Laterality: N/A;   ESOPHAGOGASTRODUODENOSCOPY N/A 01/22/2015   Procedure: ESOPHAGOGASTRODUODENOSCOPY (EGD);  Surgeon: Pietro Bridegroom, MD;  Location: [facility];  Service: Endoscopy;  Laterality: N/A;   FLEXIBLE SIGMOIDOSCOPY N/A 11/04/2014   Procedure: FLEXIBLE SIGMOIDOSCOPY;  Surgeon: Pietro Bridegroom, MD;  Location: [facility] ENDOSCOPY;  Service: Endoscopy;  Laterality: N/A;   ILEOSTOMY CLOSURE N/A 12/18/2014   Procedure: LOOP ILEOSTOMY REVERSAL;  Surgeon: Joyce Nixon, MD;  Location: [facility];  Service: General;  Laterality: N/A;   INCISION AND DRAINAGE Left    arm   LAPAROSCOPIC TUBAL LIGATION Bilateral 02/19/2020   Procedure: LAPAROSCOPIC TUBAL LIGATION;  Surgeon: Tresia Fruit, MD;  Location: [facility];  Service: Gynecology;  Laterality: Bilateral;   SMALL INTESTINE SURGERY     WISDOM TOOTH EXTRACTION      Social History: Social History   Tobacco Use   Smoking  status: Never   Smokeless tobacco: Never   Tobacco comments:    occasional cigarette  Vaping Use   Vaping status: Never Used  Substance Use Topics   Alcohol use: No    Alcohol/week: 0.0 standard drinks of alcohol   Drug use: No   No Known Allergies  Family History  Problem Relation Age of Onset   Diabetes Father    Diabetes Maternal Grandmother    Heart disease  Maternal Grandmother    Hypertension Maternal Grandmother    Kidney disease Maternal Grandmother    Lung cancer Maternal Grandfather    Hyperlipidemia Maternal Grandfather    Stroke Neg Hx    Colon cancer Neg Hx    Colon polyps Neg Hx    Esophageal cancer Neg Hx    Rectal cancer Neg Hx    Stomach cancer Neg Hx      Prior to Admission medications   Medication Sig Start Date End Date Taking? Authorizing Provider  sodium chloride  0.9 % SOLN 250 mL with vedolizumab  300 MG SOLR 300 mg Inject 300 mg into the vein every 28 (twenty-eight) days. Entyvio  every 4 weeks at [facility] infusion center. 02/08/22  Yes Armbruster, Lendon Queen, MD  budesonide  (ENTOCORT EC ) 3 MG 24 hr capsule Take 3 capsules (9 mg total) by mouth daily. Patient not taking: Reported on 04/26/2024 04/23/24   Ace Holder, MD  Cholecalciferol (VITAMIN D3) 125 MCG (5000 UT) CAPS Take 1 capsule (5,000 Units total) by mouth daily. Patient not taking: Reported on 04/26/2024 02/03/22   Ace Holder, MD  clindamycin  (CLEOCIN ) 150 MG capsule Take 1 capsule (150 mg total) by mouth every 6 (six) hours. Patient not taking: Reported on 04/26/2024 03/10/24   Roemhildt, Lorin T, PA-C  dicyclomine  (BENTYL ) 10 MG capsule Take 1 capsule (10 mg total) by mouth every 8 (eight) hours as needed (diarrhea/abdominal cramping). Patient not taking: Reported on 04/26/2024 03/07/22   Ace Holder, MD  ferrous sulfate  325 (65 FE) MG EC tablet Take 1 tablet (325 mg total) by mouth daily with breakfast. Patient not taking: Reported on 04/26/2024 02/03/22   Ace Holder, MD  lidocaine  (XYLOCAINE ) 2 % solution Use as directed 15 mLs in the mouth or throat as needed for mouth pain. Patient not taking: Reported on 04/26/2024 03/10/24   Roemhildt, Lorin T, PA-C  ondansetron  (ZOFRAN -ODT) 4 MG disintegrating tablet Take 1 tablet (4 mg total) by mouth every 8 (eight) hours as needed for nausea or vomiting. Patient not taking: Reported on 04/26/2024 02/21/23   Thomes Flicker, PA  SUMAtriptan  (IMITREX ) 50 MG tablet Take one tablet (50 mg) at onset of symptoms. May repeat in 2 hours if headache persists or recurs, for a total of 100 mg. Patient not taking: Reported on 04/26/2024 07/10/23   Graciella Lavender, [place]    Physical Exam: Vitals:   04/26/24 1800 04/26/24 1900 04/26/24 1939 04/26/24 2200  BP: (!) 155/102 (!) 151/107  (!) 156/117  Pulse: 64 73  79  Resp: 16     Temp:   98.2 F (36.8 C)   TempSrc:   Oral   SpO2: 100% 100%  100%  Weight:       Constitutional: Resting in bed, NAD, calm, somewhat comfortable Eyes: EOMI, lids and conjunctivae normal ENMT: Mucous membranes are dry. Posterior pharynx clear of any exudate or lesions.Normal dentition.  Neck: normal, supple, no masses. Respiratory: clear to auscultation bilaterally, no wheezing, no crackles. Normal respiratory effort. No accessory muscle use.  Cardiovascular: Regular rate and rhythm, no murmurs / rubs / gallops. No extremity edema. 2+ pedal pulses. Abdomen: Generalized tenderness to light palpation, no masses palpated. Musculoskeletal: no clubbing / cyanosis. No joint deformity upper and lower extremities. Good ROM, no contractures. Normal muscle tone.  Skin: no rashes, lesions, ulcers. No induration Neurologic: Sensation intact. Strength 5/5 in all 4.  Psychiatric: Normal judgment and insight. Alert and oriented x 3. Normal mood.   EKG: Not performed.  Assessment/Plan Principal Problem:   Crohn's disease of both small and large intestine with intestinal obstruction (HCC)   Jessica [name] is a 28 y.o. 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  Assessment and Plan: Crohn's ileocolitis with small bowel obstruction: Seen by general surgery who have recommended taking to OR tonight for exploratory laparotomy.  We will keep n.p.o., continue maintenance IV fluids,  analgesics as needed.  Further management as per general surgery.  Leukocytosis: Likely reactive process from inflammation versus bowel ischemia.   DVT prophylaxis: SCDs Start: 04/26/24 2152 Code Status: Full code Family Communication: Mother at bedside Disposition Plan: From home, dispo pending clinical progress Consults called: General Surgery Severity of Illness: The appropriate patient status for this patient is INPATIENT. Inpatient status is judged to be reasonable and necessary in order to provide the required intensity of service to ensure the patient's safety. The patient's presenting symptoms, physical exam findings, and initial radiographic and laboratory data in the context of their chronic comorbidities is felt to place them at high risk for further clinical deterioration. Furthermore, it is not anticipated that the patient will be medically stable for discharge from the hospital within 2 midnights of admission.   * I certify that at the point of admission it is my clinical judgment that the patient will require inpatient hospital care spanning beyond 2 midnights from the point of admission due to high intensity of service, high risk for further deterioration and high frequency of surveillance required.Edith Gores MD [facility]  If 7PM-7AM, please contact night-coverage [email or web address]  04/26/2024, 10:28 PM

## 2024-04-26 NOTE — Consult Note (Addendum)
 Reason for Consult:bowel obstruction Referring Provider: [name] is an 28 y.o. female.  HPI: 28 yo female with 4 day history of abdominal pain nausea and vomiting. Her last bowel movement was 3 days ago. Pain is diffuse. It is constant and intense. It makes it difficult to stand and walk. She came to the ED yesterday but left due to the wait. She came back because the pain continued to worsen. She had a ileocecectomy at age 23. She had an ileostomy reversal in 2021. She has been on entresto for maintenance.  Past Medical History:  Diagnosis Date   Anemia    Crohn's disease (HCC)    Osteoporosis    Supervision of normal first pregnancy 05/03/2013   Uveitis    Vitamin D  deficiency     Past Surgical History:  Procedure Laterality Date   COLONOSCOPY N/A 01/22/2015   Procedure: COLONOSCOPY;  Surgeon: Pietro Bridegroom, MD;  Location: [facility];  Service: Endoscopy;  Laterality: N/A;   DILATION AND EVACUATION N/A 10/31/2014   Procedure: DILATATION AND EVACUATION;  Surgeon: Abdul Hodgkin, MD;  Location: [facility];  Service: Gynecology;  Laterality: N/A;   ESOPHAGOGASTRODUODENOSCOPY N/A 01/22/2015   Procedure: ESOPHAGOGASTRODUODENOSCOPY (EGD);  Surgeon: Pietro Bridegroom, MD;  Location: [facility];  Service: Endoscopy;  Laterality: N/A;   FLEXIBLE SIGMOIDOSCOPY N/A 11/04/2014   Procedure: FLEXIBLE SIGMOIDOSCOPY;  Surgeon: Pietro Bridegroom, MD;  Location: [facility] ENDOSCOPY;  Service: Endoscopy;  Laterality: N/A;   ILEOSTOMY CLOSURE N/A 12/18/2014   Procedure: LOOP ILEOSTOMY REVERSAL;  Surgeon: Joyce Nixon, MD;  Location: [facility];  Service: General;  Laterality: N/A;   INCISION AND DRAINAGE Left    arm   LAPAROSCOPIC TUBAL LIGATION Bilateral 02/19/2020   Procedure: LAPAROSCOPIC TUBAL LIGATION;  Surgeon: Tresia Fruit, MD;  Location: [facility];  Service: Gynecology;  Laterality: Bilateral;   SMALL INTESTINE SURGERY     WISDOM TOOTH EXTRACTION      Family  History  Problem Relation Age of Onset   Diabetes Father    Diabetes Maternal Grandmother    Heart disease Maternal Grandmother    Hypertension Maternal Grandmother    Kidney disease Maternal Grandmother    Lung cancer Maternal Grandfather    Hyperlipidemia Maternal Grandfather    Stroke Neg Hx    Colon cancer Neg Hx    Colon polyps Neg Hx    Esophageal cancer Neg Hx    Rectal cancer Neg Hx    Stomach cancer Neg Hx     Social History:  reports that she has never smoked. She has never used smokeless tobacco. She reports that she does not drink alcohol and does not use drugs.  Allergies: No Known Allergies  Medications: I have reviewed the patient's current medications.  Results for orders placed or performed during the hospital encounter of 04/26/24 (from the past 48 hours)  Comprehensive metabolic panel     Status: Abnormal   Collection Time: 04/26/24  5:16 PM  Result Value Ref Range   Sodium 136 135 - 145 mmol/L   Potassium 3.6 3.5 - 5.1 mmol/L   Chloride 101 98 - 111 mmol/L   CO2 25 22 - 32 mmol/L   Glucose, Bld 90 70 - 99 mg/dL    Comment: Glucose reference range applies only to samples taken after fasting for at least 8 hours.   BUN 9 6 - 20 mg/dL   Creatinine, Ser 0.45 0.44 - 1.00 mg/dL   Calcium 9.6  8.9 - 10.3 mg/dL   Total Protein 8.7 (H) 6.5 - 8.1 g/dL   Albumin 3.8 3.5 - 5.0 g/dL   AST 21 15 - 41 U/L   ALT 17 0 - 44 U/L   Alkaline Phosphatase 74 38 - 126 U/L   Total Bilirubin 0.9 0.0 - 1.2 mg/dL   GFR, Estimated >40 >98 mL/min    Comment: (NOTE) Calculated using the CKD-EPI Creatinine Equation (2021)    Anion gap 10 5 - 15    Comment: Performed at [facility], [address], [place], [place] [address]  CBC with Differential     Status: Abnormal   Collection Time: 04/26/24  5:16 PM  Result Value Ref Range   WBC 17.6 (H) 4.0 - 10.5 K/uL   RBC 4.96 3.87 - 5.11 MIL/uL   Hemoglobin 14.3 12.0 - 15.0 g/dL   HCT 78.2 95.6 - 21.3 %   MCV 89.7 80.0 -  100.0 fL   MCH 28.8 26.0 - 34.0 pg   MCHC 32.1 30.0 - 36.0 g/dL   RDW 08.6 57.8 - 46.9 %   Platelets 484 (H) 150 - 400 K/uL   nRBC 0.0 0.0 - 0.2 %   Neutrophils Relative % 84 %   Neutro Abs 14.6 (H) 1.7 - 7.7 K/uL   Lymphocytes Relative 12 %   Lymphs Abs 2.1 0.7 - 4.0 K/uL   Monocytes Relative 4 %   Monocytes Absolute 0.8 0.1 - 1.0 K/uL   Eosinophils Relative 0 %   Eosinophils Absolute 0.1 0.0 - 0.5 K/uL   Basophils Relative 0 %   Basophils Absolute 0.0 0.0 - 0.1 K/uL   Immature Granulocytes 0 %   Abs Immature Granulocytes 0.07 0.00 - 0.07 K/uL    Comment: Performed at [facility], [address] [place], [place] [address]  Lipase, blood     Status: Abnormal   Collection Time: 04/26/24  5:16 PM  Result Value Ref Range   Lipase 62 (H) 11 - 51 U/L    Comment: Performed at [facility], [address] [place], [place] [address]    PE Blood pressure (!) 156/117, pulse 79, temperature 98.2 F (36.8 C), temperature source Oral, resp. rate 16, weight 90 kg, last menstrual period 04/19/2024, SpO2 100%. Constitutional: NAD; conversant; no deformities Eyes: Moist conjunctiva; no lid lag; anicteric; PERRL Neck: Trachea midline; no thyromegaly Lungs: Normal respiratory effort; no tactile fremitus CV: RRR; no palpable thrills; no pitting edema GI: Abd soft, tender to palpation throughout with guarding; no palpable hepatosplenomegaly MSK: Normal gait; no clubbing/cyanosis Psychiatric: Appropriate affect; alert and oriented x3 Lymphatic: No palpable cervical or axillary lymphadenopathy Skin: No major subcutaneous nodules. Warm and dry   Assessment/Plan: 28 yo female with crohn's disease presents with bowel obstruction. On my review of CT scan there is concern for mesenteric twisting near the area of the anastomoses with edema. There appears to be dilation in the ileum beyond this area, so it may not be a full internal hernia but has concerning features. I would not say this  is a partial small bowel obstruction from CT images. She has a leukocytosis and tenderness/guarding on exam both concerning for ischemia. -I think she needs to go to the OR for exploration now. We discussed open abdominal procedure with lysis of adhesions, possible bowel obstruction, possible ostomy and inpatient hospitalization.  I reviewed last 24 h vitals and pain scores, last 48 h intake and output, last 24 h labs  and trends, and last 24 h imaging results.  This care required high  level of medical decision making.   Jessica [name] Jessica [name] 04/26/2024, 10:08 PM

## 2024-04-26 NOTE — ED Provider Notes (Signed)
 I saw and evaluated the patient, reviewed the resident's note and I agree with the findings and plan.   28 year old female with history of Crohn's disease presents with several days of abdominal pain with some nonbilious emesis.  Concern for possible bowel obstruction.  Labs and abdominal CT pending at this time.  Anticipate admission   Lind Repine, MD 04/26/24 [phone]

## 2024-04-26 NOTE — ED Provider Notes (Signed)
 [place] EMERGENCY DEPARTMENT AT [facility] Provider Note  History  Chief Complaint:  Abdominal Pain and Emesis   Abdominal Pain Pain location:  Generalized Pain quality: aching, bloating and cramping   Pain radiates to:  Does not radiate Pain severity:  Moderate Onset quality:  Gradual Duration:  4 days Timing:  Constant Progression:  Worsening Chronicity:  New Context comment:  Hx Crohn's Associated symptoms: constipation, nausea and vomiting   Associated symptoms: no chest pain, no diarrhea, no dysuria, no fatigue, no fever, no flatus, no hematochezia, no hematuria, no shortness of breath and no vaginal bleeding   Emesis Associated symptoms: abdominal pain   Associated symptoms: no diarrhea and no fever      Jessica [name] is a 28 y.o. female with a history of Crohn's disease who presents the emergency department for abdominal pain as above.  She also states that she has not had a bowel movement.  Past Medical History:  Diagnosis Date   Anemia    Crohn's disease (HCC)    Osteoporosis    Supervision of normal first pregnancy 05/03/2013   Uveitis    Vitamin D  deficiency     Past Surgical History:  Procedure Laterality Date   COLONOSCOPY N/A 01/22/2015   Procedure: COLONOSCOPY;  Surgeon: Pietro Bridegroom, MD;  Location: [facility];  Service: Endoscopy;  Laterality: N/A;   DILATION AND EVACUATION N/A 10/31/2014   Procedure: DILATATION AND EVACUATION;  Surgeon: Abdul Hodgkin, MD;  Location: [facility];  Service: Gynecology;  Laterality: N/A;   ESOPHAGOGASTRODUODENOSCOPY N/A 01/22/2015   Procedure: ESOPHAGOGASTRODUODENOSCOPY (EGD);  Surgeon: Pietro Bridegroom, MD;  Location: [facility];  Service: Endoscopy;  Laterality: N/A;   FLEXIBLE SIGMOIDOSCOPY N/A 11/04/2014   Procedure: FLEXIBLE SIGMOIDOSCOPY;  Surgeon: Pietro Bridegroom, MD;  Location: [facility]COPY;  Service: Endoscopy;  Laterality: N/A;   ILEOSTOMY CLOSURE N/A 12/18/2014   Procedure: LOOP ILEOSTOMY  REVERSAL;  Surgeon: Joyce Nixon, MD;  Location: [facility];  Service: General;  Laterality: N/A;   INCISION AND DRAINAGE Left    arm   LAPAROSCOPIC TUBAL LIGATION Bilateral 02/19/2020   Procedure: LAPAROSCOPIC TUBAL LIGATION;  Surgeon: Tresia Fruit, MD;  Location: [facility];  Service: Gynecology;  Laterality: Bilateral;   SMALL INTESTINE SURGERY     WISDOM TOOTH EXTRACTION      Family History  Problem Relation Age of Onset   Diabetes Father    Diabetes Maternal Grandmother    Heart disease Maternal Grandmother    Hypertension Maternal Grandmother    Kidney disease Maternal Grandmother    Lung cancer Maternal Grandfather    Hyperlipidemia Maternal Grandfather    Stroke Neg Hx    Colon cancer Neg Hx    Colon polyps Neg Hx    Esophageal cancer Neg Hx    Rectal cancer Neg Hx    Stomach cancer Neg Hx     Social History   Tobacco Use   Smoking status: Never   Smokeless tobacco: Never   Tobacco comments:    occasional cigarette  Vaping Use   Vaping status: Never Used  Substance Use Topics   Alcohol use: No    Alcohol/week: 0.0 standard drinks of alcohol   Drug use: No    Review of Systems  Review of Systems  Constitutional:  Negative for fatigue and fever.  Respiratory:  Negative for shortness of breath.   Cardiovascular:  Negative for chest pain.  Gastrointestinal:  Positive for abdominal pain, constipation, nausea and vomiting.  Negative for diarrhea, flatus and hematochezia.  Genitourinary:  Negative for dysuria, hematuria and vaginal bleeding.     Reviewed and documented in HPI if pertinent.   Physical Exam   ED Triage Vitals  Encounter Vitals Group     BP 04/26/24 1355 (!) 148/107     Systolic BP Percentile --      Diastolic BP Percentile --      Pulse Rate 04/26/24 1355 86     Resp 04/26/24 1355 16     Temp 04/26/24 1355 98 F (36.7 C)     Temp src --      SpO2 04/26/24 1355 100 %     Weight 04/26/24 1357 198 lb 6.6 oz (90 kg)     Height  --      Head Circumference --      Peak Flow --      Pain Score 04/26/24 1356 10     Pain Loc --      Pain Education --      Exclude from Growth Chart --      Physical Exam Vitals and nursing note reviewed.  Constitutional:      General: She is not in acute distress.    Appearance: She is well-developed. She is ill-appearing. She is not toxic-appearing.  HENT:     Head: Normocephalic and atraumatic.  Eyes:     Conjunctiva/sclera: Conjunctivae normal.  Cardiovascular:     Rate and Rhythm: Normal rate and regular rhythm.     Heart sounds: No murmur heard. Pulmonary:     Effort: Pulmonary effort is normal. No respiratory distress.     Breath sounds: Normal breath sounds.  Abdominal:     Palpations: Abdomen is soft.     Tenderness: There is generalized abdominal tenderness. There is guarding. There is no rebound.  Musculoskeletal:        General: No swelling.     Cervical back: Neck supple.  Skin:    General: Skin is warm and dry.     Capillary Refill: Capillary refill takes less than 2 seconds.  Neurological:     Mental Status: She is alert.  Psychiatric:        Mood and Affect: Mood normal.      Procedures   Procedures  ED Course - Medical Decision Making  Brief Overview Jessica [name] is a 28 y.o. female who presents as per above.  I have reviewed the nursing documentation for past medical history, family history, and social history and agree.  I have reviewed the patient's vital signs. There are no abnormalities.  Initial Differential Diagnoses: I am primarily concerned for perforation, bowel obstruction, ileitis, Crohn's flare, electrolyte abnormalities, pancreatitis.  Therapies: These medications and interventions were provided for the patient while in the ED.  Medications  ondansetron  (ZOFRAN ) injection 4 mg (has no administration in time range)  lactated ringers  infusion (has no administration in time range)  acetaminophen  (TYLENOL ) suppository 650  mg (has no administration in time range)  morphine  (PF) 2 MG/ML injection 2 mg (has no administration in time range)  cefoTEtan  (CEFOTAN ) 2 g in sodium chloride  0.9 % 100 mL IVPB (has no administration in time range)  morphine  (PF) 4 MG/ML injection 4 mg (4 mg Intravenous Given 04/26/24 1710)  ondansetron  (ZOFRAN ) injection 4 mg (4 mg Intravenous Given 04/26/24 1708)  lactated ringers  bolus 1,000 mL (0 mLs Intravenous Stopped 04/26/24 1855)  iohexol  (OMNIPAQUE ) 350 MG/ML injection 75 mL (75 mLs Intravenous Contrast Given 04/26/24  1921)  morphine  (PF) 4 MG/ML injection 6 mg (6 mg Intravenous Given 04/26/24 2130)    Testing Results: On my interpretation labs are significant for : Mildly elevated lipase Leukocytosis No significant electrolyte abnormalities  On my interpretation imaging is significant for: Transition point identified concern for small bowel obstruction  See the EMR for full details regarding lab and imaging results.   Medical Decision Making 28 year old female who presents the emergency department for generalized abdominal pain.  Patient does have history of Crohn's.  On exam patient does have generalized abdominal tenderness.  The abdomen is slightly distended.  No signs of peritonitis.  Logical auscultation bilaterally.  She is afebrile.  On review of laboratory studies yesterday patient did have an elevated white count, lipase.  She had to leave the emergency department.  She returns today for worsening abdominal pain.  She contacted her GI specialist and they did put her on a short course of budesonide  however she has had worsening pain.  No new recent medication changes other than that.  She has had no fevers.  No overt signs of infection currently.  CT scan was concerning for a small bowel obstruction.  Laboratory studies reveal leukocytosis.  Mildly elevated lipase which I suspect is in the setting of vomiting.  I discussed with general surgery and they have decided to admit  the patient to medicine.  They will see the patient as a consult.  I discussed with the hospitalist and patient will be admitted to their service.  I discussed NG tube with the patient.  She states that she would not like this at this time.  I discussed this with the hospitalist and general surgery and they are aware.  Amount and/or Complexity of Data Reviewed Labs: ordered. Radiology: ordered.  Risk Prescription drug management. Decision regarding hospitalization.    Clinical Course as of 04/26/24 2226  Fri Apr 26, 2024  2052 Findings concerning for small bowel obstruction.  Consult general surgery at this time. [WC]  2058 Discussed with patient about obstruction. She is stating she does not want an NG tube at this time. [WC]  2123 Patient will be admitted to hospitalist. [WC]    Clinical Course User Index [WC] Arminda Landmark, MD     ### All radiography studies, electrocardiograms, and laboratory data were personally reviewed by me and incorporated into my medical decision making. Impression   1. SBO (small bowel obstruction) (HCC)   2. Crohn's disease with intestinal obstruction, unspecified gastrointestinal tract location [facility])      Note: Dragon medical dictation software was used in the creation of this note.     Arminda Landmark, MD 04/26/24 2226    Lind Repine, MD 04/26/24 2234

## 2024-04-26 NOTE — Anesthesia Preprocedure Evaluation (Signed)
 Anesthesia Evaluation  Patient identified by MRN, date of birth, ID band Patient awake    Reviewed: Allergy & Precautions, H&P , NPO status , Patient's Chart, lab work & pertinent test results  Airway Mallampati: II  TM Distance: >3 FB Neck ROM: Full    Dental no notable dental hx.    Pulmonary neg pulmonary ROS   Pulmonary exam normal breath sounds clear to auscultation       Cardiovascular (-) hypertension(-) angina negative cardio ROS Normal cardiovascular exam Rhythm:Regular Rate:Normal     Neuro/Psych neg Seizures negative neurological ROS  negative psych ROS   GI/Hepatic Neg liver ROS,,,Crohns   S/p ileocecectomy at age 28, ileostomy reversal in 2021  New partial SBO on CT scan   Endo/Other  negative endocrine ROS    Renal/GU negative Renal ROS  negative genitourinary   Musculoskeletal negative musculoskeletal ROS (+)    Abdominal   Peds negative pediatric ROS (+)  Hematology Leukocytosis   Anesthesia Other Findings   Reproductive/Obstetrics negative OB ROS                              Anesthesia Physical Anesthesia Plan  ASA: 2 and emergent  Anesthesia Plan: General   Post-op Pain Management:    Induction: Intravenous and Rapid sequence  PONV Risk Score and Plan: Ondansetron , Dexamethasone , Treatment may vary due to age or medical condition and Midazolam   Airway Management Planned: Oral ETT  Additional Equipment: None  Intra-op Plan:   Post-operative Plan: Extubation in OR  Informed Consent: I have reviewed the patients History and Physical, chart, labs and discussed the procedure including the risks, benefits and alternatives for the proposed anesthesia with the patient or authorized representative who has indicated his/her understanding and acceptance.     Dental advisory given  Plan Discussed with: CRNA and Surgeon  Anesthesia Plan Comments:           Anesthesia Quick Evaluation

## 2024-04-26 NOTE — Hospital Course (Signed)
 Jessica [name] is a 28 y.o. 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

## 2024-04-26 NOTE — ED Triage Notes (Signed)
 PT complains of abd pain, constipation and vomiting x 3 days. PT was here yesterday for same but had to leave before being seen. Hx of Crohns

## 2024-04-26 NOTE — Op Note (Signed)
 Preoperative diagnosis: small bowel obstruction  Postoperative diagnosis: same   Procedure: open lysis of adhesions  Surgeon: Harman Lightning, M.D.  Asst: none  Anesthesia: GETA  Indications for procedure: Jessica [name] is a 28 y.o. year old female with symptoms of abdominal pain and nausea and vomiting. She had a leukocytosis, guarding and concerning CT scan so was brought urgently to the operating room.  Description of procedure: The patient was brought into the operative suite. Anesthesia was administered with General endotracheal anesthesia. WHO checklist was applied. The patient was then placed in supine position. The area was prepped and draped in the usual sterile fashion.  A midline incision was made. Cautery was used to dissect through the subcutaneous tissue and the fascia was entered. There was a small umbilical hernia. Upon entrance of the peritoneal cavity a large amount of clear red fluid was removed. The intestines were dilated. There were no adhesions in the left portion of the abdomen. The right side of the abdomen had a thick cord from the lateral abdominal wall to the omentum creating an internal hernia that was entrapping the small bowel anastomosis. There was evidence of a chronic scar just proximal to the small intestine small intestine anastomosis. The cord was removed with ligasure. This allows untwisting of the ileum. There was a narrowing just proximal to the ileocolostomy as well. Additional lysis allowed more freedom to the area and liquid easily passed from the mid jejunum through to the colon.  The abdomen was irrigated with 2 l warm saline. Fascia was closed with 0 PDS. Staples were put in place for skin. Dressing was put in place. All counts were correct.  Findings: cord scar from right abdominal wall to omentum creating internal hernia impinging on the ileoileostomy  Specimen: none  Implant: none   Blood loss: 50 ml  Local anesthesia:  none  Complications: none  Harman Lightning, M.D. General, Bariatric, & Minimally Invasive Surgery [facility]

## 2024-04-26 NOTE — Anesthesia Procedure Notes (Signed)
 Procedure Name: Intubation Date/Time: 04/26/2024 10:48 PM  Performed by: Bryston Colocho T, CRNAPre-anesthesia Checklist: Patient identified, Emergency Drugs available, Suction available and Patient being monitored Patient Re-evaluated:Patient Re-evaluated prior to induction Oxygen Delivery Method: Circle system utilized Preoxygenation: Pre-oxygenation with 100% oxygen Induction Type: IV induction, Rapid sequence and Cricoid Pressure applied Ventilation: Mask ventilation without difficulty Laryngoscope Size: Mac and 3 Grade View: Grade I Tube type: Oral Tube size: 7.0 mm Number of attempts: 1 Airway Equipment and Method: Stylet and Oral airway Placement Confirmation: ETT inserted through vocal cords under direct vision, positive ETCO2 and breath sounds checked- equal and bilateral Secured at: 21 cm Tube secured with: Tape Dental Injury: Teeth and Oropharynx as per pre-operative assessment

## 2024-04-26 NOTE — ED Triage Notes (Signed)
 Pt prefers to wait to see docotor before having any labs drawn since she had them drawn yesterday here.

## 2024-04-27 ENCOUNTER — Encounter (HOSPITAL_COMMUNITY): Payer: Self-pay | Admitting: General Surgery

## 2024-04-27 DIAGNOSIS — K50812 Crohn's disease of both small and large intestine with intestinal obstruction: Secondary | ICD-10-CM | POA: Diagnosis not present

## 2024-04-27 LAB — CBC
HCT: 39.8 % (ref 36.0–46.0)
Hemoglobin: 12.9 g/dL (ref 12.0–15.0)
MCH: 29.1 pg (ref 26.0–34.0)
MCHC: 32.4 g/dL (ref 30.0–36.0)
MCV: 89.6 fL (ref 80.0–100.0)
Platelets: 438 10*3/uL — ABNORMAL HIGH (ref 150–400)
RBC: 4.44 MIL/uL (ref 3.87–5.11)
RDW: 13.7 % (ref 11.5–15.5)
WBC: 18.4 10*3/uL — ABNORMAL HIGH (ref 4.0–10.5)
nRBC: 0 % (ref 0.0–0.2)

## 2024-04-27 LAB — BASIC METABOLIC PANEL WITH GFR
Anion gap: 12 (ref 5–15)
BUN: 8 mg/dL (ref 6–20)
CO2: 21 mmol/L — ABNORMAL LOW (ref 22–32)
Calcium: 8.8 mg/dL — ABNORMAL LOW (ref 8.9–10.3)
Chloride: 103 mmol/L (ref 98–111)
Creatinine, Ser: 0.66 mg/dL (ref 0.44–1.00)
GFR, Estimated: >60 mL/min (ref >60)
Glucose, Bld: 112 mg/dL — ABNORMAL HIGH (ref 70–99)
Potassium: 4.1 mmol/L (ref 3.5–5.1)
Sodium: 136 mmol/L (ref 135–145)

## 2024-04-27 LAB — HIV ANTIBODY (ROUTINE TESTING W REFLEX): HIV Screen 4th Generation wRfx: NONREACTIVE

## 2024-04-27 MED ORDER — HYDROMORPHONE HCL 1 MG/ML IJ SOLN
0.5000 mg | INTRAMUSCULAR | Status: DC | PRN
  Administered 2024-04-27: 0.5 mg via INTRAVENOUS

## 2024-04-27 MED ORDER — LACTATED RINGERS IV SOLN: INTRAVENOUS | Status: DC

## 2024-04-27 MED ORDER — FENTANYL CITRATE (PF) 100 MCG/2ML IJ SOLN
INTRAMUSCULAR | Status: AC
  Filled 2024-04-27: qty 2

## 2024-04-27 MED ORDER — CHLORHEXIDINE GLUCONATE CLOTH 2 % EX PADS
6.0000 | MEDICATED_PAD | Freq: Every day | CUTANEOUS | Status: DC
  Administered 2024-04-27 – 2024-04-29 (×2): 6 via TOPICAL

## 2024-04-27 MED ORDER — HYDROMORPHONE HCL 1 MG/ML IJ SOLN
INTRAMUSCULAR | Status: AC
  Filled 2024-04-27: qty 1

## 2024-04-27 NOTE — Progress Notes (Signed)
 PROGRESS NOTE  Jessica [name]:[id number] DOB: [birth year] DOA: 04/26/2024 PCP: Patient, No Pcp Per   LOS: 1 day   Brief Narrative / Interim history:  28 y.o. female with medical history significant for Crohn's ileocolitis (s/p remote ileal resection) on Entyvio  who presented to the ED for evaluation of abdominal pain, nausea, and vomiting.  She was found to have small bowel obstruction, was taken to the OR on 5/9  Subjective / 24h Interval events: Complains of abdominal pain this morning, no nausea or vomiting.  Has not had a bowel movement yet  Assesement and Plan: Principal Problem:   Crohn's disease of both small and large intestine with intestinal obstruction (HCC)  Principal problem Crohn's ileocolitis with small bowel obstruction-status post ex lap on 5/9 by Dr. Zadie Herter, underwent LOA  Active problems Leukocytosis-perioperatively.  Monitor.  Afebrile  Obesity, class I-BMI 32  Scheduled Meds:  fentaNYL        Continuous Infusions:  lactated ringers  125 mL/hr at 04/27/24 0959   PRN Meds:.acetaminophen , fentaNYL , HYDROmorphone  (DILAUDID ) injection, menthol -cetylpyridinium, ondansetron  (ZOFRAN ) IV  Current Outpatient Medications  Medication Instructions   budesonide  (ENTOCORT EC ) 9 mg, Oral, Daily   clindamycin  (CLEOCIN ) 150 mg, Oral, Every 6 hours   dicyclomine  (BENTYL ) 10 mg, Oral, Every 8 hours PRN   ferrous sulfate  325 mg, Oral, Daily with breakfast   lidocaine  (XYLOCAINE ) 2 % solution 15 mLs, Mouth/Throat, As needed   ondansetron  (ZOFRAN -ODT) 4 mg, Oral, Every 8 hours PRN   sodium chloride  0.9 % SOLN 250 mL with vedolizumab  300 MG SOLR 300 mg 300 mg, Intravenous, Every 28 days, Entyvio  every 4 weeks at [facility] infusion center.   SUMAtriptan  (IMITREX ) 50 MG tablet Take one tablet (50 mg) at onset of symptoms. May repeat in 2 hours if headache persists or recurs, for a total of 100 mg.   Vitamin D3 5,000 Units, Oral, Daily    Diet Orders (From admission, onward)      Start     Ordered   04/26/24 2128  Diet NPO time specified  Diet effective now        04/26/24 2127            DVT prophylaxis: SCDs Start: 04/26/24 2152   Lab Results  Component Value Date   PLT 438 (H) 04/27/2024      Code Status: Full Code  Family Communication: No family at bedside  Status is: Inpatient Remains inpatient appropriate because: Severity of illness   Level of care: Med-Surg  Consultants:  General surgery  Objective: Vitals:   04/27/24 0055 04/27/24 0115 04/27/24 0316 04/27/24 0822  BP: (!) 155/103 (!) 155/104 (!) 142/104 (!) 162/112  Pulse: 63 69 67 83  Resp: 16 19 20 19   Temp: 97.9 F (36.6 C) 98.4 F (36.9 C) 98.3 F (36.8 C) 98.3 F (36.8 C)  TempSrc:  Oral Oral   SpO2: 100% 100% 100% 100%  Weight:        Intake/Output Summary (Last 24 hours) at 04/27/2024 1027 Last data filed at 04/27/2024 0900 Gross per 24 hour  Intake 1700 ml  Output 525 ml  Net 1175 ml   Wt Readings from Last 3 Encounters:  04/26/24 90 kg  04/18/24 90.7 kg  03/05/24 90.7 kg    Examination:  Constitutional: NAD Eyes: no scleral icterus ENMT: Mucous membranes are moist.  Neck: normal, supple Respiratory: clear to auscultation bilaterally, no wheezing, no crackles.  Cardiovascular: Regular rate and rhythm, no murmurs / rubs / gallops.  Abdomen:  non distended Musculoskeletal: no clubbing / cyanosis.    Data Reviewed: I have independently reviewed following labs and imaging studies   CBC Recent Labs  Lab 04/25/24 1846 04/26/24 1716 04/27/24 0831  WBC 16.5* 17.6* 18.4*  HGB 13.4 14.3 12.9  HCT 42.4 44.5 39.8  PLT 542* 484* 438*  MCV 90.4 89.7 89.6  MCH 28.6 28.8 29.1  MCHC 31.6 32.1 32.4  RDW 13.7 13.6 13.7  LYMPHSABS  --  2.1  --   MONOABS  --  0.8  --   EOSABS  --  0.1  --   BASOSABS  --  0.0  --     Recent Labs  Lab 04/25/24 1846 04/26/24 1716 04/27/24 0831  NA 136 136 136  K 3.7 3.6 4.1  CL 102 101 103  CO2 23 25 21*  GLUCOSE  101* 90 112*  BUN 10 9 8   CREATININE 0.72 0.80 0.66  CALCIUM 9.8 9.6 8.8*  AST 26 21  --   ALT 18 17  --   ALKPHOS 67 74  --   BILITOT 0.8 0.9  --   ALBUMIN 3.8 3.8  --     ------------------------------------------------------------------------------------------------------------------ No results for input(s): "CHOL", "HDL", "LDLCALC", "TRIG", "CHOLHDL", "LDLDIRECT" in the last 72 hours.  Lab Results  Component Value Date   HGBA1C 4.8 12/15/2014   ------------------------------------------------------------------------------------------------------------------ No results for input(s): "TSH", "T4TOTAL", "T3FREE", "THYROIDAB" in the last 72 hours.  Invalid input(s): "FREET3"  Cardiac Enzymes No results for input(s): "CKMB", "TROPONINI", "MYOGLOBIN" in the last 168 hours.  Invalid input(s): "CK" ------------------------------------------------------------------------------------------------------------------ No results found for: "BNP"  CBG: No results for input(s): "GLUCAP" in the last 168 hours.  No results found for this or any previous visit (from the past 240 hours).   Radiology Studies: CT ABDOMEN PELVIS W CONTRAST Result Date: 04/26/2024 CLINICAL DATA:  Abdominal pain. EXAM: CT ABDOMEN AND PELVIS WITH CONTRAST TECHNIQUE: Multidetector CT imaging of the abdomen and pelvis was performed using the standard protocol following bolus administration of intravenous contrast. RADIATION DOSE REDUCTION: This exam was performed according to the departmental dose-optimization program which includes automated exposure control, adjustment of the mA and/or kV according to patient size and/or use of iterative reconstruction technique. CONTRAST:  75mL OMNIPAQUE  IOHEXOL  350 MG/ML SOLN COMPARISON:  January 03, 2015 FINDINGS: Lower chest: No acute abnormality. Hepatobiliary: There is diffuse fatty infiltration of the liver parenchyma. No focal liver abnormality is seen. Small folds are seen  within the gallbladder fundus. No gallstones, gallbladder wall thickening, or biliary dilatation. Pancreas: Unremarkable. No pancreatic ductal dilatation or surrounding inflammatory changes. Spleen: Normal in size without focal abnormality. Adrenals/Urinary Tract: Adrenal glands are unremarkable. Kidneys are normal in size, without renal calculi or hydronephrosis. A small, stable simple left renal cyst is seen. Bladder is unremarkable. Stomach/Bowel: There is a small hiatal hernia. Surgically anastomosed bowel is seen within the mid to upper right abdomen and along the expected region of the cecum and proximal ascending colon. Multiple dilated small bowel loops are also seen throughout the abdomen and pelvis (maximum small bowel diameter of approximately 3.2 cm). A transition zone is seen within the mid right abdomen at an area of surgically anastomosed large and small bowel. Vascular/Lymphatic: No significant vascular findings are present. No enlarged abdominal or pelvic lymph nodes. Reproductive: Bilateral tubal ligation clips are seen. The uterus and bilateral adnexa are otherwise unremarkable. Other: No abdominal wall hernia or abnormality. There is a small amount of pelvic free fluid. Musculoskeletal: No acute or significant  osseous findings. IMPRESSION: 1. Findings consistent with a partial small bowel obstruction with a transition zone seen within the mid right abdomen at an area of surgically anastomosed large and small bowel. 2. Small hiatal hernia. 3. Hepatic steatosis. 4. Small amount of pelvic free fluid, likely reactive in nature. Electronically Signed   By: Virgle Grime M.D.   On: 04/26/2024 20:47     Kathlen Para, MD, PhD [facility]  Between 7 am - 7 pm I am available, please contact me via Amion (for emergencies) or Securechat (non urgent messages)  Between 7 pm - 7 am I am not available, please contact night coverage MD/APP via Amion

## 2024-04-27 NOTE — Anesthesia Postprocedure Evaluation (Signed)
 Anesthesia Post Note  Patient: Jessica [name]  Procedure(s) Performed: LAPAROTOMY, EXPLORATORY, LYSIS OF ADHESIONS (Abdomen)     Patient location during evaluation: PACU Anesthesia Type: General Level of consciousness: awake and alert Pain management: pain level controlled Vital Signs Assessment: post-procedure vital signs reviewed and stable Respiratory status: spontaneous breathing, nonlabored ventilation, respiratory function stable and patient connected to nasal cannula oxygen Cardiovascular status: blood pressure returned to baseline and stable Postop Assessment: no apparent nausea or vomiting Anesthetic complications: no   No notable events documented.  Last Vitals:  Vitals:   04/27/24 0055 04/27/24 0115  BP: (!) 155/103 (!) 155/104  Pulse: 63 69  Resp: 16 19  Temp: 36.6 C 36.9 C  SpO2: 100% 100%    Last Pain:  Vitals:   04/27/24 0115  TempSrc: Oral  PainSc:                  [name]

## 2024-04-27 NOTE — Plan of Care (Signed)
   Problem: Clinical Measurements: Goal: Ability to maintain clinical measurements within normal limits will improve Outcome: Progressing

## 2024-04-27 NOTE — Progress Notes (Signed)
 Assessment & Plan: POD#1 - status post ex lap with lysis of adhesions - LK 04/26/24 - NPO, NG decompression - IVF - Crohn's Rx per [facility] - encouraged OOB, ambulation - pain Rx  Overall doing well.  Moderate abd pain.  Encouraged OOB, ambulation in halls.        [name], MD [facility] practice Office: [phone]        Chief Complaint: SBO  Subjective: Patient in bed, some pain.  NG with minimal output.  Objective: Vital signs in last 24 hours: Temp:  [97.9 F (36.6 C)-98.4 F (36.9 C)] 98.3 F (36.8 C) (05/10 0822) Pulse Rate:  [59-86] 83 (05/10 0822) Resp:  [11-20] 19 (05/10 0822) BP: (142-162)/(101-117) 162/112 (05/10 0822) SpO2:  [100 %] 100 % (05/10 0822) Weight:  [90 kg] 90 kg (05/09 1357)    Intake/Output from previous day: 05/09 0701 - 05/10 0700 In: 1700 [I.V.:1600; IV Piggyback:100] Out: 525 [Urine:500; Blood:25] Intake/Output this shift: No intake/output data recorded.  Physical Exam: HEENT - sclerae clear, mucous membranes moist Neck - soft Abdomen - soft, dressing dry and intact; diffuse mild tenderness Ext - no edema, non-tender  Lab Results:  Recent Labs    04/26/24 1716 04/27/24 0831  WBC 17.6* 18.4*  HGB 14.3 12.9  HCT 44.5 39.8  PLT 484* 438*   BMET Recent Labs    04/26/24 1716 04/27/24 0831  NA 136 136  K 3.6 4.1  CL 101 103  CO2 25 21*  GLUCOSE 90 112*  BUN 9 8  CREATININE 0.80 0.66  CALCIUM 9.6 8.8*   PT/INR No results for input(s): "LABPROT", "INR" in the last 72 hours. Comprehensive Metabolic Panel:    Component Value Date/Time   NA 136 04/27/2024 0831   NA 136 04/26/2024 1716   NA 138 10/16/2019 1420   NA 136 04/18/2019 1026   K 4.1 04/27/2024 0831   K 3.6 04/26/2024 1716   CL 103 04/27/2024 0831   CL 101 04/26/2024 1716   CO2 21 (L) 04/27/2024 0831   CO2 25 04/26/2024 1716   BUN 8 04/27/2024 0831   BUN 9 04/26/2024 1716   BUN 6 10/16/2019 1420   BUN 8 04/18/2019 1026    CREATININE 0.66 04/27/2024 0831   CREATININE 0.80 04/26/2024 1716   GLUCOSE 112 (H) 04/27/2024 0831   GLUCOSE 90 04/26/2024 1716   CALCIUM 8.8 (L) 04/27/2024 0831   CALCIUM 9.6 04/26/2024 1716   AST 21 04/26/2024 1716   AST 26 04/25/2024 1846   ALT 17 04/26/2024 1716   ALT 18 04/25/2024 1846   ALKPHOS 74 04/26/2024 1716   ALKPHOS 67 04/25/2024 1846   BILITOT 0.9 04/26/2024 1716   BILITOT 0.8 04/25/2024 1846   BILITOT 0.3 10/16/2019 1420   BILITOT <0.2 04/18/2019 1026   PROT 8.7 (H) 04/26/2024 1716   PROT 8.4 (H) 04/25/2024 1846   PROT 6.8 10/16/2019 1420   PROT 7.1 04/18/2019 1026   ALBUMIN 3.8 04/26/2024 1716   ALBUMIN 3.8 04/25/2024 1846   ALBUMIN 3.7 (L) 10/16/2019 1420   ALBUMIN 4.1 04/18/2019 1026    Studies/Results: CT ABDOMEN PELVIS W CONTRAST Result Date: 04/26/2024 CLINICAL DATA:  Abdominal pain. EXAM: CT ABDOMEN AND PELVIS WITH CONTRAST TECHNIQUE: Multidetector CT imaging of the abdomen and pelvis was performed using the standard protocol following bolus administration of intravenous contrast. RADIATION DOSE REDUCTION: This exam was performed according to the departmental dose-optimization program which includes automated exposure control, adjustment of the  mA and/or kV according to patient size and/or use of iterative reconstruction technique. CONTRAST:  75mL OMNIPAQUE  IOHEXOL  350 MG/ML SOLN COMPARISON:  January 03, 2015 FINDINGS: Lower chest: No acute abnormality. Hepatobiliary: There is diffuse fatty infiltration of the liver parenchyma. No focal liver abnormality is seen. Small folds are seen within the gallbladder fundus. No gallstones, gallbladder wall thickening, or biliary dilatation. Pancreas: Unremarkable. No pancreatic ductal dilatation or surrounding inflammatory changes. Spleen: Normal in size without focal abnormality. Adrenals/Urinary Tract: Adrenal glands are unremarkable. Kidneys are normal in size, without renal calculi or hydronephrosis. A small, stable simple  left renal cyst is seen. Bladder is unremarkable. Stomach/Bowel: There is a small hiatal hernia. Surgically anastomosed bowel is seen within the mid to upper right abdomen and along the expected region of the cecum and proximal ascending colon. Multiple dilated small bowel loops are also seen throughout the abdomen and pelvis (maximum small bowel diameter of approximately 3.2 cm). A transition zone is seen within the mid right abdomen at an area of surgically anastomosed large and small bowel. Vascular/Lymphatic: No significant vascular findings are present. No enlarged abdominal or pelvic lymph nodes. Reproductive: Bilateral tubal ligation clips are seen. The uterus and bilateral adnexa are otherwise unremarkable. Other: No abdominal wall hernia or abnormality. There is a small amount of pelvic free fluid. Musculoskeletal: No acute or significant osseous findings. IMPRESSION: 1. Findings consistent with a partial small bowel obstruction with a transition zone seen within the mid right abdomen at an area of surgically anastomosed large and small bowel. 2. Small hiatal hernia. 3. Hepatic steatosis. 4. Small amount of pelvic free fluid, likely reactive in nature. Electronically Signed   By: Virgle Grime M.D.   On: 04/26/2024 20:47      [name] 04/27/2024  Patient ID: Jessica [name], female   DOB: [birth year], 28 y.o.   MRN: [id number]

## 2024-04-27 NOTE — Transfer of Care (Signed)
 Immediate Anesthesia Transfer of Care Note  Patient: Jessica [name]  Procedure(s) Performed: LAPAROTOMY, EXPLORATORY, LYSIS OF ADHESIONS (Abdomen)  Patient Location: PACU  Anesthesia Type:General  Level of Consciousness: awake, alert , and oriented  Airway & Oxygen Therapy: Patient Spontanous Breathing and Patient connected to nasal cannula oxygen  Post-op Assessment: Report given to RN, Post -op Vital signs reviewed and stable, and Patient moving all extremities  Post vital signs: Reviewed and stable  Last Vitals:  Vitals Value Taken Time  BP 161/101 04/27/24 0015  Temp    Pulse 69 04/27/24 0016  Resp 13 04/27/24 0016  SpO2 100 % 04/27/24 0016  Vitals shown include unfiled device data.  Last Pain:  Vitals:   04/27/24 0008  TempSrc:   PainSc: 9          Complications: No notable events documented.

## 2024-04-28 DIAGNOSIS — K50812 Crohn's disease of both small and large intestine with intestinal obstruction: Secondary | ICD-10-CM | POA: Diagnosis not present

## 2024-04-28 MED ORDER — SODIUM CHLORIDE 0.9 % IV SOLN: INTRAVENOUS | Status: AC

## 2024-04-28 NOTE — Progress Notes (Signed)
 Assessment & Plan: POD#2 - status post ex lap with lysis of adhesions - LK 04/26/24 - NPO, NG decompression - IVF - Crohn's Rx per [facility] - encouraged OOB, ambulation - pain Rx   Pain somewhat improved.  Passing small flatus, no BM.  Will leave NG for now.  Encouraged OOB, ambulation - discussed with nurse in room.        Jessica Billow, MD [facility] practice Office: [phone]        Chief Complaint: SBO  Subjective: Patient in bed, didn't sleep much.  Pain somewhat better.  Passing small flatus.  Objective: Vital signs in last 24 hours: Temp:  [97.5 F (36.4 C)-98.2 F (36.8 C)] 97.9 F (36.6 C) (05/11 0805) Pulse Rate:  [71-85] 84 (05/11 0805) Resp:  [18] 18 (05/11 0805) BP: (136-155)/(97-105) 136/99 (05/11 0805) SpO2:  [97 %-100 %] 97 % (05/11 0805)    Intake/Output from previous day: 05/10 0701 - 05/11 0700 In: 2880.5 [I.V.:2880.5] Out: 1900 [Urine:1850; Emesis/NG output:50] Intake/Output this shift: Total I/O In: 333.8 [I.V.:333.8] Out: 100 [Emesis/NG output:100]  Physical Exam: Abdomen - soft, mild distension; wound dry and intact; NG small bilious   Lab Results:  Recent Labs    04/26/24 1716 04/27/24 0831  WBC 17.6* 18.4*  HGB 14.3 12.9  HCT 44.5 39.8  PLT 484* 438*   BMET Recent Labs    04/26/24 1716 04/27/24 0831  NA 136 136  K 3.6 4.1  CL 101 103  CO2 25 21*  GLUCOSE 90 112*  BUN 9 8  CREATININE 0.80 0.66  CALCIUM 9.6 8.8*   PT/INR No results for input(s): "LABPROT", "INR" in the last 72 hours. Comprehensive Metabolic Panel:    Component Value Date/Time   NA 136 04/27/2024 0831   NA 136 04/26/2024 1716   NA 138 10/16/2019 1420   NA 136 04/18/2019 1026   K 4.1 04/27/2024 0831   K 3.6 04/26/2024 1716   CL 103 04/27/2024 0831   CL 101 04/26/2024 1716   CO2 21 (L) 04/27/2024 0831   CO2 25 04/26/2024 1716   BUN 8 04/27/2024 0831   BUN 9 04/26/2024 1716   BUN 6 10/16/2019 1420   BUN 8 04/18/2019  1026   CREATININE 0.66 04/27/2024 0831   CREATININE 0.80 04/26/2024 1716   GLUCOSE 112 (H) 04/27/2024 0831   GLUCOSE 90 04/26/2024 1716   CALCIUM 8.8 (L) 04/27/2024 0831   CALCIUM 9.6 04/26/2024 1716   AST 21 04/26/2024 1716   AST 26 04/25/2024 1846   ALT 17 04/26/2024 1716   ALT 18 04/25/2024 1846   ALKPHOS 74 04/26/2024 1716   ALKPHOS 67 04/25/2024 1846   BILITOT 0.9 04/26/2024 1716   BILITOT 0.8 04/25/2024 1846   BILITOT 0.3 10/16/2019 1420   BILITOT <0.2 04/18/2019 1026   PROT 8.7 (H) 04/26/2024 1716   PROT 8.4 (H) 04/25/2024 1846   PROT 6.8 10/16/2019 1420   PROT 7.1 04/18/2019 1026   ALBUMIN 3.8 04/26/2024 1716   ALBUMIN 3.8 04/25/2024 1846   ALBUMIN 3.7 (L) 10/16/2019 1420   ALBUMIN 4.1 04/18/2019 1026    Studies/Results: CT ABDOMEN PELVIS W CONTRAST Result Date: 04/26/2024 CLINICAL DATA:  Abdominal pain. EXAM: CT ABDOMEN AND PELVIS WITH CONTRAST TECHNIQUE: Multidetector CT imaging of the abdomen and pelvis was performed using the standard protocol following bolus administration of intravenous contrast. RADIATION DOSE REDUCTION: This exam was performed according to the departmental dose-optimization program which includes automated exposure control, adjustment  of the mA and/or kV according to patient size and/or use of iterative reconstruction technique. CONTRAST:  75mL OMNIPAQUE  IOHEXOL  350 MG/ML SOLN COMPARISON:  January 03, 2015 FINDINGS: Lower chest: No acute abnormality. Hepatobiliary: There is diffuse fatty infiltration of the liver parenchyma. No focal liver abnormality is seen. Small folds are seen within the gallbladder fundus. No gallstones, gallbladder wall thickening, or biliary dilatation. Pancreas: Unremarkable. No pancreatic ductal dilatation or surrounding inflammatory changes. Spleen: Normal in size without focal abnormality. Adrenals/Urinary Tract: Adrenal glands are unremarkable. Kidneys are normal in size, without renal calculi or hydronephrosis. A small, stable  simple left renal cyst is seen. Bladder is unremarkable. Stomach/Bowel: There is a small hiatal hernia. Surgically anastomosed bowel is seen within the mid to upper right abdomen and along the expected region of the cecum and proximal ascending colon. Multiple dilated small bowel loops are also seen throughout the abdomen and pelvis (maximum small bowel diameter of approximately 3.2 cm). A transition zone is seen within the mid right abdomen at an area of surgically anastomosed large and small bowel. Vascular/Lymphatic: No significant vascular findings are present. No enlarged abdominal or pelvic lymph nodes. Reproductive: Bilateral tubal ligation clips are seen. The uterus and bilateral adnexa are otherwise unremarkable. Other: No abdominal wall hernia or abnormality. There is a small amount of pelvic free fluid. Musculoskeletal: No acute or significant osseous findings. IMPRESSION: 1. Findings consistent with a partial small bowel obstruction with a transition zone seen within the mid right abdomen at an area of surgically anastomosed large and small bowel. 2. Small hiatal hernia. 3. Hepatic steatosis. 4. Small amount of pelvic free fluid, likely reactive in nature. Electronically Signed   By: Jessica [name] M.D.   On: 04/26/2024 20:47      Jessica [name] 04/28/2024  Patient ID: Jessica [name], female   DOB: 1996-03-08, 28 y.o.   MRN: [id number]

## 2024-04-28 NOTE — Progress Notes (Signed)
 PROGRESS NOTE  Jessica [name] ZOX:[id number] DOB: 03-06-1996 DOA: 04/26/2024 PCP: Patient, No Pcp Per   LOS: 2 days   Brief Narrative / Interim history:  28 y.o. female with medical history significant for Crohn's ileocolitis (s/p remote ileal resection) on Entyvio  who presented to the ED for evaluation of abdominal pain, nausea, and vomiting.  She was found to have small bowel obstruction, was taken to the OR on 5/9  Subjective / 24h Interval events: Doing well, no nausea or vomiting.  Has not had a bowel movement yet  Assesement and Plan: Principal Problem:   Crohn's disease of both small and large intestine with intestinal obstruction (HCC)  Principal problem Crohn's ileocolitis with small bowel obstruction-status post ex lap on 5/9 by Dr. Zadie Herter, underwent LOA - Discussed with gastroenterology, scan reviewed, no evidence of active disease.  She is on Entyvio  as an outpatient and she got it about a week ago  Active problems Leukocytosis-perioperatively.  Monitor.  Afebrile  Obesity, class I-BMI 32  Scheduled Meds:  Chlorhexidine  Gluconate Cloth  6 each Topical Daily   Continuous Infusions:  sodium chloride      PRN Meds:.acetaminophen , HYDROmorphone  (DILAUDID ) injection, menthol -cetylpyridinium, ondansetron  (ZOFRAN ) IV  Current Outpatient Medications  Medication Instructions   budesonide  (ENTOCORT EC ) 9 mg, Oral, Daily   clindamycin  (CLEOCIN ) 150 mg, Oral, Every 6 hours   dicyclomine  (BENTYL ) 10 mg, Oral, Every 8 hours PRN   ferrous sulfate  325 mg, Oral, Daily with breakfast   lidocaine  (XYLOCAINE ) 2 % solution 15 mLs, Mouth/Throat, As needed   ondansetron  (ZOFRAN -ODT) 4 mg, Oral, Every 8 hours PRN   sodium chloride  0.9 % SOLN 250 mL with vedolizumab  300 MG SOLR 300 mg 300 mg, Intravenous, Every 28 days, Entyvio  every 4 weeks at [facility] infusion center.   SUMAtriptan  (IMITREX ) 50 MG tablet Take one tablet (50 mg) at onset of symptoms. May repeat in 2 hours if headache  persists or recurs, for a total of 100 mg.   Vitamin D3 5,000 Units, Oral, Daily    Diet Orders (From admission, onward)     Start     Ordered   04/26/24 2128  Diet NPO time specified  Diet effective now        04/26/24 2127            DVT prophylaxis: SCDs Start: 04/26/24 2152   Lab Results  Component Value Date   PLT 438 (H) 04/27/2024      Code Status: Full Code  Family Communication: No family at bedside  Status is: Inpatient Remains inpatient appropriate because: Severity of illness   Level of care: Med-Surg  Consultants:  General surgery  Objective: Vitals:   04/27/24 1526 04/27/24 2008 04/28/24 0543 04/28/24 0805  BP: (!) 144/104 (!) 155/105 (!) 150/97 (!) 136/99  Pulse: 85 75 71 84  Resp: 18 18 18 18   Temp: 98.1 F (36.7 C) 98.2 F (36.8 C) (!) 97.5 F (36.4 C) 97.9 F (36.6 C)  TempSrc: Oral Oral Oral Oral  SpO2: 100% 100% 97% 97%  Weight:        Intake/Output Summary (Last 24 hours) at 04/28/2024 1101 Last data filed at 04/28/2024 0932 Gross per 24 hour  Intake 1889.01 ml  Output 1900 ml  Net -10.99 ml   Wt Readings from Last 3 Encounters:  04/26/24 90 kg  04/18/24 90.7 kg  03/05/24 90.7 kg    Examination: Constitutional: NAD Eyes: lids and conjunctivae normal, no scleral icterus ENMT: mmm Neck: normal, supple Respiratory:  No wheezing Cardiovascular: No edema  Data Reviewed: I have independently reviewed following labs and imaging studies   CBC Recent Labs  Lab 04/25/24 1846 04/26/24 1716 04/27/24 0831  WBC 16.5* 17.6* 18.4*  HGB 13.4 14.3 12.9  HCT 42.4 44.5 39.8  PLT 542* 484* 438*  MCV 90.4 89.7 89.6  MCH 28.6 28.8 29.1  MCHC 31.6 32.1 32.4  RDW 13.7 13.6 13.7  LYMPHSABS  --  2.1  --   MONOABS  --  0.8  --   EOSABS  --  0.1  --   BASOSABS  --  0.0  --     Recent Labs  Lab 04/25/24 1846 04/26/24 1716 04/27/24 0831  NA 136 136 136  K 3.7 3.6 4.1  CL 102 101 103  CO2 23 25 21*  GLUCOSE 101* 90 112*  BUN  10 9 8   CREATININE 0.72 0.80 0.66  CALCIUM 9.8 9.6 8.8*  AST 26 21  --   ALT 18 17  --   ALKPHOS 67 74  --   BILITOT 0.8 0.9  --   ALBUMIN 3.8 3.8  --     ------------------------------------------------------------------------------------------------------------------ No results for input(s): "CHOL", "HDL", "LDLCALC", "TRIG", "CHOLHDL", "LDLDIRECT" in the last 72 hours.  Lab Results  Component Value Date   HGBA1C 4.8 12/15/2014   ------------------------------------------------------------------------------------------------------------------ No results for input(s): "TSH", "T4TOTAL", "T3FREE", "THYROIDAB" in the last 72 hours.  Invalid input(s): "FREET3"  Cardiac Enzymes No results for input(s): "CKMB", "TROPONINI", "MYOGLOBIN" in the last 168 hours.  Invalid input(s): "CK" ------------------------------------------------------------------------------------------------------------------ No results found for: "BNP"  CBG: No results for input(s): "GLUCAP" in the last 168 hours.  No results found for this or any previous visit (from the past 240 hours).   Radiology Studies: No results found.    Kathlen Para, MD, PhD [facility]  Between 7 am - 7 pm I am available, please contact me via Amion (for emergencies) or Securechat (non urgent messages)  Between 7 pm - 7 am I am not available, please contact night coverage MD/APP via Amion

## 2024-04-29 DIAGNOSIS — K50812 Crohn's disease of both small and large intestine with intestinal obstruction: Secondary | ICD-10-CM | POA: Diagnosis not present

## 2024-04-29 LAB — COMPREHENSIVE METABOLIC PANEL WITH GFR
ALT: 14 U/L (ref 0–44)
AST: 20 U/L (ref 15–41)
Albumin: 2.8 g/dL — ABNORMAL LOW (ref 3.5–5.0)
Alkaline Phosphatase: 52 U/L (ref 38–126)
Anion gap: 12 (ref 5–15)
BUN: 7 mg/dL (ref 6–20)
CO2: 23 mmol/L (ref 22–32)
Calcium: 8.4 mg/dL — ABNORMAL LOW (ref 8.9–10.3)
Chloride: 99 mmol/L (ref 98–111)
Creatinine, Ser: 0.57 mg/dL (ref 0.44–1.00)
GFR, Estimated: >60 mL/min (ref >60)
Glucose, Bld: 66 mg/dL — ABNORMAL LOW (ref 70–99)
Potassium: 3.8 mmol/L (ref 3.5–5.1)
Sodium: 134 mmol/L — ABNORMAL LOW (ref 135–145)
Total Bilirubin: 1 mg/dL (ref 0.0–1.2)
Total Protein: 6.7 g/dL (ref 6.5–8.1)

## 2024-04-29 LAB — CBC
HCT: 36.2 % (ref 36.0–46.0)
Hemoglobin: 11.6 g/dL — ABNORMAL LOW (ref 12.0–15.0)
MCH: 29.1 pg (ref 26.0–34.0)
MCHC: 32 g/dL (ref 30.0–36.0)
MCV: 91 fL (ref 80.0–100.0)
Platelets: 303 10*3/uL (ref 150–400)
RBC: 3.98 MIL/uL (ref 3.87–5.11)
RDW: 13.4 % (ref 11.5–15.5)
WBC: 11.3 10*3/uL — ABNORMAL HIGH (ref 4.0–10.5)
nRBC: 0 % (ref 0.0–0.2)

## 2024-04-29 LAB — MAGNESIUM: Magnesium: 1.6 mg/dL — ABNORMAL LOW (ref 1.7–2.4)

## 2024-04-29 MED ORDER — ACETAMINOPHEN 10 MG/ML IV SOLN
1000.0000 mg | Freq: Four times a day (QID) | INTRAVENOUS | Status: AC
  Administered 2024-04-29 – 2024-04-30 (×3): 1000 mg via INTRAVENOUS
  Filled 2024-04-29 (×4): qty 100

## 2024-04-29 MED ORDER — METHOCARBAMOL 1000 MG/10ML IJ SOLN: 500.0000 mg | Freq: Three times a day (TID) | INTRAMUSCULAR | Status: DC | PRN

## 2024-04-29 MED ORDER — MAGNESIUM SULFATE 2 GM/50ML IV SOLN
2.0000 g | Freq: Once | INTRAVENOUS | Status: AC
  Administered 2024-04-29: 2 g via INTRAVENOUS
  Filled 2024-04-29: qty 50

## 2024-04-29 MED ORDER — ENOXAPARIN SODIUM 40 MG/0.4ML IJ SOSY
40.0000 mg | PREFILLED_SYRINGE | INTRAMUSCULAR | Status: DC
  Administered 2024-04-29 – 2024-05-09 (×11): 40 mg via SUBCUTANEOUS
  Filled 2024-04-29 (×11): qty 0.4

## 2024-04-29 NOTE — Plan of Care (Signed)
   Problem: Education: Goal: Knowledge of General Education information will improve Description: Including pain rating scale, medication(s)/side effects and non-pharmacologic comfort measures Outcome: Completed/Met

## 2024-04-29 NOTE — Progress Notes (Addendum)
 PROGRESS NOTE  BETUL MUDRAK ZOX:[id number] DOB: 07-06-1996 DOA: 04/26/2024 PCP: Patient, No Pcp Per   LOS: 3 days   Brief Narrative / Interim history:  28 y.o. female with medical history significant for Crohn's ileocolitis (s/p remote ileal resection) on Entyvio  who presented to the ED for evaluation of abdominal pain, nausea, and vomiting.  She was found to have small bowel obstruction, was taken to the OR on 5/9  Subjective / 24h Interval events: Complains of abdominal pain, passed a little bit of gas but no BMs yet  Assesement and Plan: Principal Problem:   Crohn's disease of both small and large intestine with intestinal obstruction (HCC)  Principal problem Crohn's ileocolitis with small bowel obstruction-status post ex lap on 5/9 by Dr. Zadie Herter, underwent LOA - Discussed with gastroenterology, scan reviewed, no evidence of active disease.  She is on Entyvio  as an outpatient and she got it about a week ago - Management per general surgery at this point  Active problems Leukocytosis-perioperatively.  Monitor.  Afebrile, white count improving  Hypomagnesemia-replace magnesium  Obesity, class I-BMI 32  Scheduled Meds:  Chlorhexidine  Gluconate Cloth  6 each Topical Daily   Continuous Infusions:  sodium chloride  75 mL/hr at 04/29/24 0110   PRN Meds:.acetaminophen , HYDROmorphone  (DILAUDID ) injection, menthol -cetylpyridinium, ondansetron  (ZOFRAN ) IV  Current Outpatient Medications  Medication Instructions   budesonide  (ENTOCORT EC ) 9 mg, Oral, Daily   clindamycin  (CLEOCIN ) 150 mg, Oral, Every 6 hours   dicyclomine  (BENTYL ) 10 mg, Oral, Every 8 hours PRN   ferrous sulfate  325 mg, Oral, Daily with breakfast   lidocaine  (XYLOCAINE ) 2 % solution 15 mLs, Mouth/Throat, As needed   ondansetron  (ZOFRAN -ODT) 4 mg, Oral, Every 8 hours PRN   sodium chloride  0.9 % SOLN 250 mL with vedolizumab  300 MG SOLR 300 mg 300 mg, Intravenous, Every 28 days, Entyvio  every 4 weeks at [facility] infusion  center.   SUMAtriptan  (IMITREX ) 50 MG tablet Take one tablet (50 mg) at onset of symptoms. May repeat in 2 hours if headache persists or recurs, for a total of 100 mg.   Vitamin D3 5,000 Units, Oral, Daily    Diet Orders (From admission, onward)     Start     Ordered   04/26/24 2128  Diet NPO time specified  Diet effective now        04/26/24 2127            DVT prophylaxis: SCDs Start: 04/26/24 2152   Lab Results  Component Value Date   PLT 303 04/29/2024      Code Status: Full Code  Family Communication: No family at bedside  Status is: Inpatient Remains inpatient appropriate because: Severity of illness   Level of care: Med-Surg  Consultants:  General surgery  Objective: Vitals:   04/28/24 2009 04/29/24 0507 04/29/24 0625 04/29/24 0817  BP: (!) 154/104 (!) 157/108  (!) 145/96  Pulse: 80 76  83  Resp: 18 18  18   Temp: 97.7 F (36.5 C) 97.9 F (36.6 C)  98.2 F (36.8 C)  TempSrc: Oral Oral  Oral  SpO2: 100% 100%  97%  Weight:   91.3 kg   Height:        Intake/Output Summary (Last 24 hours) at 04/29/2024 0928 Last data filed at 04/29/2024 0000 Gross per 24 hour  Intake 333.76 ml  Output 550 ml  Net -216.24 ml   Wt Readings from Last 3 Encounters:  04/29/24 91.3 kg  04/18/24 90.7 kg  03/05/24 90.7 kg  Examination: Constitutional: NAD Eyes: lids and conjunctivae normal, no scleral icterus ENMT: mmm Neck: normal, supple Respiratory: clear to auscultation bilaterally, no wheezing, no crackles.  Cardiovascular: Regular rate and rhythm, no murmurs / rubs / gallops Abdomen: Surgical dressing CDI  Data Reviewed: I have independently reviewed following labs and imaging studies   CBC Recent Labs  Lab 04/25/24 1846 04/26/24 1716 04/27/24 0831 04/29/24 0457  WBC 16.5* 17.6* 18.4* 11.3*  HGB 13.4 14.3 12.9 11.6*  HCT 42.4 44.5 39.8 36.2  PLT 542* 484* 438* 303  MCV 90.4 89.7 89.6 91.0  MCH 28.6 28.8 29.1 29.1  MCHC 31.6 32.1 32.4 32.0   RDW 13.7 13.6 13.7 13.4  LYMPHSABS  --  2.1  --   --   MONOABS  --  0.8  --   --   EOSABS  --  0.1  --   --   BASOSABS  --  0.0  --   --     Recent Labs  Lab 04/25/24 1846 04/26/24 1716 04/27/24 0831 04/29/24 0457  NA 136 136 136 134*  K 3.7 3.6 4.1 3.8  CL 102 101 103 99  CO2 23 25 21* 23  GLUCOSE 101* 90 112* 66*  BUN 10 9 8 7   CREATININE 0.72 0.80 0.66 0.57  CALCIUM 9.8 9.6 8.8* 8.4*  AST 26 21  --  20  ALT 18 17  --  14  ALKPHOS 67 74  --  52  BILITOT 0.8 0.9  --  1.0  ALBUMIN 3.8 3.8  --  2.8*  MG  --   --   --  1.6*    ------------------------------------------------------------------------------------------------------------------ No results for input(s): "CHOL", "HDL", "LDLCALC", "TRIG", "CHOLHDL", "LDLDIRECT" in the last 72 hours.  Lab Results  Component Value Date   HGBA1C 4.8 12/15/2014   ------------------------------------------------------------------------------------------------------------------ No results for input(s): "TSH", "T4TOTAL", "T3FREE", "THYROIDAB" in the last 72 hours.  Invalid input(s): "FREET3"  Cardiac Enzymes No results for input(s): "CKMB", "TROPONINI", "MYOGLOBIN" in the last 168 hours.  Invalid input(s): "CK" ------------------------------------------------------------------------------------------------------------------ No results found for: "BNP"  CBG: No results for input(s): "GLUCAP" in the last 168 hours.  No results found for this or any previous visit (from the past 240 hours).   Radiology Studies: No results found.    Kathlen Para, MD, PhD [facility]  Between 7 am - 7 pm I am available, please contact me via Amion (for emergencies) or Securechat (non urgent messages)  Between 7 pm - 7 am I am not available, please contact night coverage MD/APP via Amion

## 2024-04-29 NOTE — TOC CM/SW Note (Signed)
 Transition of Care [facility]) - Inpatient Brief Assessment   Patient Details  Name: Jessica [name] MRN: [id number] Date of Birth: 09/14/1996  [facility]) CM/SW Contact:    Tom-Johnson, Angelique Ken, RN Phone Number: 04/29/2024, 4:44 PM   Clinical Narrative:  Patient presented to the ED with Abdominal Pain, Constipation and Vomiting x 3 days. Found to have Bowel Obstruction, Gen Sx consulted. Patient underwent Ex-Lap with Lysis of Adhesions on 04/26/24 with NG tube placement, currently clamped.    Patient is from home with her mother and her three children. Not employed and not on disability. Does not drive, patient's mother drives to and from appointments.  Patient does not have a PCP, hosp f/u will be scheduled when patient is Medically for discharge. Patient uses [facility] on [address].   Patient not Medically ready for discharge.  CM will continue to follow as patient progresses with care towards discharge.              Transition of Care Asessment: Insurance and Status: Insurance coverage has been reviewed Patient has primary care physician: No (Hosp f/u will be scheduled when Medically for discharge.) Home environment has been reviewed: Yes Prior level of function:: Independent Prior/Current Home Services: No current home services Social Drivers of Health Review: SDOH reviewed no interventions necessary Readmission risk has been reviewed: Yes Transition of care needs: transition of care needs identified, TOC will continue to follow

## 2024-04-29 NOTE — Progress Notes (Signed)
 Patient ID: Jessica [name], female   DOB: [birth year], 28 y.o.   MRN: [id number]   04/29/24 1100  NG/OG Vented/Dual Lumen 16 Fr. Right nare Marking at nare/corner of mouth 57 cm  Placement Date/Time: 04/26/24 2255   Person Inserting LDA: RF, CRNA  Tube Size (Fr.): 16 Fr.  Tube Location: Right nare  Secured by: Tape  Initial Placement Verification: Confirmed by Surgical Manipulation  Tube Position (Required): Marking at nare/co...  Status Clamped   Clamped at 11:00 am per order.  Genella Kendall, RN

## 2024-04-29 NOTE — Progress Notes (Signed)
 3 Days Post-Op   Subjective/Chief Complaint: Pain not well controlled, having some flatus, ambulated   Objective: Vital signs in last 24 hours: Temp:  [97.7 F (36.5 C)-98.2 F (36.8 C)] 98.2 F (36.8 C) (05/12 0817) Pulse Rate:  [76-87] 83 (05/12 0817) Resp:  [16-18] 18 (05/12 0817) BP: (123-157)/(96-108) 145/96 (05/12 0817) SpO2:  [97 %-100 %] 97 % (05/12 0817) Weight:  [90 kg-91.3 kg] 91.3 kg (05/12 0625)    Intake/Output from previous day: 05/11 0701 - 05/12 0700 In: 333.8 [I.V.:333.8] Out: 550 [Emesis/NG output:550] Intake/Output this shift: No intake/output data recorded.  Ab nondistended wound clean without infection, soft approp tender  Lab Results:  Recent Labs    04/27/24 0831 04/29/24 0457  WBC 18.4* 11.3*  HGB 12.9 11.6*  HCT 39.8 36.2  PLT 438* 303   BMET Recent Labs    04/27/24 0831 04/29/24 0457  NA 136 134*  K 4.1 3.8  CL 103 99  CO2 21* 23  GLUCOSE 112* 66*  BUN 8 7  CREATININE 0.66 0.57  CALCIUM 8.8* 8.4*   PT/INR No results for input(s): "LABPROT", "INR" in the last 72 hours. ABG No results for input(s): "PHART", "HCO3" in the last 72 hours.  Invalid input(s): "PCO2", "PO2"  Studies/Results: No results found.  Anti-infectives: Anti-infectives (From admission, onward)    Start     Dose/Rate Route Frequency Ordered Stop   04/27/24 0600  cefoTEtan  (CEFOTAN ) 2 g in sodium chloride  0.9 % 100 mL IVPB        2 g 200 mL/hr over 30 Minutes Intravenous On call to O.R. 04/26/24 2214 04/26/24 2336   04/26/24 2234  sodium chloride  0.9 % with cefoTEtan  (CEFOTAN ) ADS Med       Note to Pharmacy: [name] T: cabinet override      04/26/24 2234 04/26/24 2316       Assessment/Plan: POD 3 - status post ex lap with lysis of adhesions - LK 04/26/24 - having flatus, ng output not as much and clear, will clamp and possibly remove later - IVF - Crohn's Rx per TRH - encouraged OOB, ambulation - pain Rx changed this am    Jessica [name] 04/29/2024

## 2024-04-30 DIAGNOSIS — K50812 Crohn's disease of both small and large intestine with intestinal obstruction: Secondary | ICD-10-CM | POA: Diagnosis not present

## 2024-04-30 LAB — COMPREHENSIVE METABOLIC PANEL WITH GFR
ALT: 13 U/L (ref 0–44)
AST: 16 U/L (ref 15–41)
Albumin: 2.8 g/dL — ABNORMAL LOW (ref 3.5–5.0)
Alkaline Phosphatase: 52 U/L (ref 38–126)
Anion gap: 11 (ref 5–15)
BUN: 6 mg/dL (ref 6–20)
CO2: 23 mmol/L (ref 22–32)
Calcium: 8.7 mg/dL — ABNORMAL LOW (ref 8.9–10.3)
Chloride: 98 mmol/L (ref 98–111)
Creatinine, Ser: 0.64 mg/dL (ref 0.44–1.00)
GFR, Estimated: >60 mL/min (ref >60)
Glucose, Bld: 73 mg/dL (ref 70–99)
Potassium: 3.9 mmol/L (ref 3.5–5.1)
Sodium: 132 mmol/L — ABNORMAL LOW (ref 135–145)
Total Bilirubin: 0.9 mg/dL (ref 0.0–1.2)
Total Protein: 6.7 g/dL (ref 6.5–8.1)

## 2024-04-30 LAB — CBC
HCT: 35.3 % — ABNORMAL LOW (ref 36.0–46.0)
Hemoglobin: 11.5 g/dL — ABNORMAL LOW (ref 12.0–15.0)
MCH: 28.5 pg (ref 26.0–34.0)
MCHC: 32.6 g/dL (ref 30.0–36.0)
MCV: 87.6 fL (ref 80.0–100.0)
Platelets: 267 10*3/uL (ref 150–400)
RBC: 4.03 MIL/uL (ref 3.87–5.11)
RDW: 13.2 % (ref 11.5–15.5)
WBC: 8.5 10*3/uL (ref 4.0–10.5)
nRBC: 0 % (ref 0.0–0.2)

## 2024-04-30 LAB — MAGNESIUM: Magnesium: 1.9 mg/dL (ref 1.7–2.4)

## 2024-04-30 MED ORDER — ACETAMINOPHEN 10 MG/ML IV SOLN
1000.0000 mg | Freq: Four times a day (QID) | INTRAVENOUS | Status: AC
  Administered 2024-04-30 – 2024-05-01 (×4): 1000 mg via INTRAVENOUS
  Filled 2024-04-30 (×4): qty 100

## 2024-04-30 MED ORDER — ORAL CARE MOUTH RINSE
15.0000 mL | OROMUCOSAL | Status: DC
  Administered 2024-04-30 – 2024-05-09 (×31): 15 mL via OROMUCOSAL

## 2024-04-30 MED ORDER — ORAL CARE MOUTH RINSE: 15.0000 mL | OROMUCOSAL | Status: DC | PRN

## 2024-04-30 MED ORDER — METHOCARBAMOL 1000 MG/10ML IJ SOLN
500.0000 mg | Freq: Three times a day (TID) | INTRAMUSCULAR | Status: DC
  Administered 2024-04-30 – 2024-05-01 (×3): 500 mg via INTRAVENOUS
  Filled 2024-04-30 (×3): qty 10

## 2024-04-30 NOTE — Progress Notes (Addendum)
 PROGRESS NOTE  Jessica [name] ZOX:[id number] DOB: 1996-07-01 DOA: 04/26/2024 PCP: Patient, No Pcp Per   LOS: 4 days   Brief Narrative / Interim history:  28 y.o. female with medical history significant for Crohn's ileocolitis (s/p remote ileal resection) on Entyvio  who presented to the ED for evaluation of abdominal pain, nausea, and vomiting.  She was found to have small bowel obstruction, was taken to the OR on 5/9 status post LOA.  Had some mild postoperative ileus but now improving  Subjective / 24h Interval events: Still has some abdominal pain, but feeling better that the NG tube is out.  No nausea or vomiting.  Has not had anything to eat this morning  Assesement and Plan: Principal Problem:   Crohn's disease of both small and large intestine with intestinal obstruction (HCC)  Principal problem Crohn's ileocolitis with small bowel obstruction-status post ex lap on 5/9 by Dr. Zadie Herter, underwent LOA - Discussed with gastroenterology, scan reviewed, no evidence of active disease.  She is on Entyvio  as an outpatient and she got it about a week ago - Management per general surgery at this point, NG tube out, advance diet per surgery  Active problems Leukocytosis-perioperatively.  Monitor.  Afebrile, white count normalized this morning  Hypomagnesemia-replace magnesium, better today.  Potassium has been stable  Obesity, class I-BMI 32.  She would benefit from weight loss  Scheduled Meds:  Chlorhexidine  Gluconate Cloth  6 each Topical Daily   enoxaparin  (LOVENOX ) injection  40 mg Subcutaneous Q24H   mouth rinse  15 mL Mouth Rinse 4 times per day   Continuous Infusions:  acetaminophen  1,000 mg (04/30/24 0458)   PRN Meds:.HYDROmorphone  (DILAUDID ) injection, menthol -cetylpyridinium, methocarbamol (ROBAXIN) injection, ondansetron  (ZOFRAN ) IV, mouth rinse  Current Outpatient Medications  Medication Instructions   budesonide  (ENTOCORT EC ) 9 mg, Oral, Daily   clindamycin  (CLEOCIN )  150 mg, Oral, Every 6 hours   dicyclomine  (BENTYL ) 10 mg, Oral, Every 8 hours PRN   ferrous sulfate  325 mg, Oral, Daily with breakfast   lidocaine  (XYLOCAINE ) 2 % solution 15 mLs, Mouth/Throat, As needed   ondansetron  (ZOFRAN -ODT) 4 mg, Oral, Every 8 hours PRN   sodium chloride  0.9 % SOLN 250 mL with vedolizumab  300 MG SOLR 300 mg 300 mg, Intravenous, Every 28 days, Entyvio  every 4 weeks at [facility] infusion center.   SUMAtriptan  (IMITREX ) 50 MG tablet Take one tablet (50 mg) at onset of symptoms. May repeat in 2 hours if headache persists or recurs, for a total of 100 mg.   Vitamin D3 5,000 Units, Oral, Daily    Diet Orders (From admission, onward)     Start     Ordered   04/26/24 2128  Diet NPO time specified  Diet effective now        04/26/24 2127            DVT prophylaxis: enoxaparin  (LOVENOX ) injection 40 mg Start: 04/29/24 1100 SCDs Start: 04/26/24 2152   Lab Results  Component Value Date   PLT 267 04/30/2024      Code Status: Full Code  Family Communication: No family at bedside  Status is: Inpatient Remains inpatient appropriate because: Severity of illness   Level of care: Med-Surg  Consultants:  General surgery  Objective: Vitals:   04/29/24 1636 04/29/24 2003 04/30/24 0455 04/30/24 0820  BP: 135/87 138/88 (!) 133/91 (!) 131/97  Pulse: 85 87 81 69  Resp: 18 20 18    Temp: 98.4 F (36.9 C) 99.7 F (37.6 C) 98 F (36.7 C) (!)  97.4 F (36.3 C)  TempSrc: Oral Oral Oral Oral  SpO2: 98% 98% 100% 100%  Weight:      Height:        Intake/Output Summary (Last 24 hours) at 04/30/2024 0950 Last data filed at 04/30/2024 0600 Gross per 24 hour  Intake 510.52 ml  Output 300 ml  Net 210.52 ml   Wt Readings from Last 3 Encounters:  04/29/24 91.3 kg  04/18/24 90.7 kg  03/05/24 90.7 kg    Examination: Constitutional: NAD Eyes: lids and conjunctivae normal, no scleral icterus ENMT: mmm Neck: normal, supple Respiratory: clear to auscultation bilaterally,  no wheezing, no crackles. Normal respiratory effort.  Cardiovascular: Regular rate and rhythm, no murmurs / rubs / gallops. No LE edema.  Data Reviewed: I have independently reviewed following labs and imaging studies   CBC Recent Labs  Lab 04/25/24 1846 04/26/24 1716 04/27/24 0831 04/29/24 0457 04/30/24 0512  WBC 16.5* 17.6* 18.4* 11.3* 8.5  HGB 13.4 14.3 12.9 11.6* 11.5*  HCT 42.4 44.5 39.8 36.2 35.3*  PLT 542* 484* 438* 303 267  MCV 90.4 89.7 89.6 91.0 87.6  MCH 28.6 28.8 29.1 29.1 28.5  MCHC 31.6 32.1 32.4 32.0 32.6  RDW 13.7 13.6 13.7 13.4 13.2  LYMPHSABS  --  2.1  --   --   --   MONOABS  --  0.8  --   --   --   EOSABS  --  0.1  --   --   --   BASOSABS  --  0.0  --   --   --     Recent Labs  Lab 04/25/24 1846 04/26/24 1716 04/27/24 0831 04/29/24 0457 04/30/24 0512  NA 136 136 136 134* 132*  K 3.7 3.6 4.1 3.8 3.9  CL 102 101 103 99 98  CO2 23 25 21* 23 23  GLUCOSE 101* 90 112* 66* 73  BUN 10 9 8 7 6   CREATININE 0.72 0.80 0.66 0.57 0.64  CALCIUM 9.8 9.6 8.8* 8.4* 8.7*  AST 26 21  --  20 16  ALT 18 17  --  14 13  ALKPHOS 67 74  --  52 52  BILITOT 0.8 0.9  --  1.0 0.9  ALBUMIN 3.8 3.8  --  2.8* 2.8*  MG  --   --   --  1.6* 1.9    ------------------------------------------------------------------------------------------------------------------ No results for input(s): "CHOL", "HDL", "LDLCALC", "TRIG", "CHOLHDL", "LDLDIRECT" in the last 72 hours.  Lab Results  Component Value Date   HGBA1C 4.8 12/15/2014   ------------------------------------------------------------------------------------------------------------------ No results for input(s): "TSH", "T4TOTAL", "T3FREE", "THYROIDAB" in the last 72 hours.  Invalid input(s): "FREET3"  Cardiac Enzymes No results for input(s): "CKMB", "TROPONINI", "MYOGLOBIN" in the last 168 hours.  Invalid input(s):  "CK" ------------------------------------------------------------------------------------------------------------------ No results found for: "BNP"  CBG: No results for input(s): "GLUCAP" in the last 168 hours.  No results found for this or any previous visit (from the past 240 hours).   Radiology Studies: No results found.    Kathlen Para, MD, PhD [facility]  Between 7 am - 7 pm I am available, please contact me via Amion (for emergencies) or Securechat (non urgent messages)  Between 7 pm - 7 am I am not available, please contact night coverage MD/APP via Amion

## 2024-04-30 NOTE — Progress Notes (Signed)
 4 Days Post-Op   Subjective/Chief Complaint: Pain control better. Still passing flatus. No bm. No n/v. Ambulating in room.   Objective: Vital signs in last 24 hours: Temp:  [97.4 F (36.3 C)-99.7 F (37.6 C)] 97.4 F (36.3 C) (05/13 0820) Pulse Rate:  [69-87] 69 (05/13 0820) Resp:  [18-20] 18 (05/13 0455) BP: (131-138)/(87-97) 131/97 (05/13 0820) SpO2:  [98 %-100 %] 100 % (05/13 0820) Last BM Date :  (passing gas)  Intake/Output from previous day: 05/12 0701 - 05/13 0700 In: 510.5 [IV Piggyback:510.5] Out: 300 [Urine:300] Intake/Output this shift: No intake/output data recorded.  Ab nondistended incision cdi with honeycomb without infection, soft approp tender  Lab Results:  Recent Labs    04/29/24 0457 04/30/24 0512  WBC 11.3* 8.5  HGB 11.6* 11.5*  HCT 36.2 35.3*  PLT 303 267   BMET Recent Labs    04/29/24 0457 04/30/24 0512  NA 134* 132*  K 3.8 3.9  CL 99 98  CO2 23 23  GLUCOSE 66* 73  BUN 7 6  CREATININE 0.57 0.64  CALCIUM 8.4* 8.7*   PT/INR No results for input(s): "LABPROT", "INR" in the last 72 hours. ABG No results for input(s): "PHART", "HCO3" in the last 72 hours.  Invalid input(s): "PCO2", "PO2"  Studies/Results: No results found.  Anti-infectives: Anti-infectives (From admission, onward)    Start     Dose/Rate Route Frequency Ordered Stop   04/27/24 0600  cefoTEtan  (CEFOTAN ) 2 g in sodium chloride  0.9 % 100 mL IVPB        2 g 200 mL/hr over 30 Minutes Intravenous On call to O.R. 04/26/24 2214 04/26/24 2336   04/26/24 2234  sodium chloride  0.9 % with cefoTEtan  (CEFOTAN ) ADS Med       Note to Pharmacy: [name] T: cabinet override      04/26/24 2234 04/26/24 2316       Assessment/Plan: POD 4 - status post ex lap with lysis of adhesions - LK 04/26/24 - NGT out. Start CLD - IVF - Crohn's Rx per TRH - encouraged OOB, ambulation - better pain control this am  FEN: CLD ID: ancef periop VTE: lovenox    Jessica [name],  [facility] 04/30/2024, 10:48 AM Please see Amion for pager number during day hours 7:00am-4:30pm

## 2024-05-01 DIAGNOSIS — K50812 Crohn's disease of both small and large intestine with intestinal obstruction: Secondary | ICD-10-CM | POA: Diagnosis not present

## 2024-05-01 DIAGNOSIS — K50912 Crohn's disease, unspecified, with intestinal obstruction: Secondary | ICD-10-CM

## 2024-05-01 DIAGNOSIS — K56609 Unspecified intestinal obstruction, unspecified as to partial versus complete obstruction: Secondary | ICD-10-CM

## 2024-05-01 MED ORDER — OXYCODONE HCL 5 MG PO TABS: 5.0000 mg | ORAL_TABLET | ORAL | Status: DC | PRN

## 2024-05-01 MED ORDER — METHOCARBAMOL 500 MG PO TABS
500.0000 mg | ORAL_TABLET | Freq: Four times a day (QID) | ORAL | Status: DC
  Administered 2024-05-01 – 2024-05-06 (×21): 500 mg via ORAL
  Filled 2024-05-01 (×21): qty 1

## 2024-05-01 MED ORDER — ACETAMINOPHEN 500 MG PO TABS: 1000.0000 mg | ORAL_TABLET | Freq: Three times a day (TID) | ORAL | Status: DC

## 2024-05-01 MED ORDER — HYDROMORPHONE HCL 1 MG/ML IJ SOLN
0.5000 mg | INTRAMUSCULAR | Status: DC | PRN
  Administered 2024-05-01 – 2024-05-04 (×16): 1 mg via INTRAVENOUS
  Filled 2024-05-01 (×17): qty 1

## 2024-05-01 MED ORDER — ACETAMINOPHEN 500 MG PO TABS
1000.0000 mg | ORAL_TABLET | Freq: Four times a day (QID) | ORAL | Status: DC
  Administered 2024-05-01 – 2024-05-09 (×28): 1000 mg via ORAL
  Filled 2024-05-01 (×34): qty 2

## 2024-05-01 MED ORDER — METHOCARBAMOL 500 MG PO TABS: 500.0000 mg | ORAL_TABLET | Freq: Four times a day (QID) | ORAL | Status: DC | PRN

## 2024-05-01 MED ORDER — IBUPROFEN 600 MG PO TABS: 600.0000 mg | ORAL_TABLET | Freq: Three times a day (TID) | ORAL | Status: DC

## 2024-05-01 MED ORDER — OXYCODONE HCL 5 MG PO TABS
5.0000 mg | ORAL_TABLET | ORAL | Status: DC | PRN
Start: 2024-05-01 — End: 2024-05-09
  Administered 2024-05-01 – 2024-05-04 (×9): 10 mg via ORAL
  Administered 2024-05-04: 5 mg via ORAL
  Administered 2024-05-04 – 2024-05-05 (×2): 10 mg via ORAL
  Administered 2024-05-05: 5 mg via ORAL
  Administered 2024-05-05 – 2024-05-09 (×8): 10 mg via ORAL
  Filled 2024-05-01 (×21): qty 2

## 2024-05-01 NOTE — Progress Notes (Signed)
 5 Days Post-Op   Subjective/Chief Complaint: Pain better controlled. Tolerating clears - reports drinking a lot of water. Reports two episodes of nausea yesterday that improved with meds. Reports one, small, BM descried as mostly mucous. +flatus. Mobilizing.   Also reports episode of low grade temp (99) and sweats.  Objective: Vital signs in last 24 hours: Temp:  [97.8 F (36.6 C)-99.4 F (37.4 C)] 98.2 F (36.8 C) (05/14 0837) Pulse Rate:  [65-91] 87 (05/14 0837) Resp:  [17-18] 17 (05/14 0837) BP: (124-142)/(84-107) 142/107 (05/14 0837) SpO2:  [99 %-100 %] 99 % (05/14 0837) Last BM Date :  (passing gas)  Intake/Output from previous day: 05/13 0701 - 05/14 0700 In: 800 [P.O.:600; IV Piggyback:200] Out: -  Intake/Output this shift: No intake/output data recorded.  Ab with some distention, dressing c/d/i, soft approp tender  Lab Results:  Recent Labs    04/29/24 0457 04/30/24 0512  WBC 11.3* 8.5  HGB 11.6* 11.5*  HCT 36.2 35.3*  PLT 303 267   BMET Recent Labs    04/29/24 0457 04/30/24 0512  NA 134* 132*  K 3.8 3.9  CL 99 98  CO2 23 23  GLUCOSE 66* 73  BUN 7 6  CREATININE 0.57 0.64  CALCIUM 8.4* 8.7*   PT/INR No results for input(s): "LABPROT", "INR" in the last 72 hours. ABG No results for input(s): "PHART", "HCO3" in the last 72 hours.  Invalid input(s): "PCO2", "PO2"  Studies/Results: No results found.  Anti-infectives: Anti-infectives (From admission, onward)    Start     Dose/Rate Route Frequency Ordered Stop   04/27/24 0600  cefoTEtan  (CEFOTAN ) 2 g in sodium chloride  0.9 % 100 mL IVPB        2 g 200 mL/hr over 30 Minutes Intravenous On call to O.R. 04/26/24 2214 04/26/24 2336   04/26/24 2234  sodium chloride  0.9 % with cefoTEtan  (CEFOTAN ) ADS Med       Note to Pharmacy: [name] T: cabinet override      04/26/24 2234 04/26/24 2316       Assessment/Plan: POD 4 - status post ex lap with lysis of adhesions - LK 04/26/24 - having  flatus, small BM - tolerating clears but ongoing mild distention along with nausea, continue CLD for now. - increase PO pain control today - tylenol  QID, PO robaxin QID, oxy 5-10 mg q 4h PRN, dilaudid  for breakthrough. - Crohn's Rx per [facility] - encouraged OOB, ambulation  Charlott Converse 05/01/2024

## 2024-05-01 NOTE — Progress Notes (Signed)
  Progress Note   Patient: Jessica [name] ZOX:[id number] DOB: 04-Feb-1996 DOA: 04/26/2024     5 DOS: the patient was seen and examined on 05/01/2024   Brief hospital course: Jessica [name] is a 28 y.o. 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  Assessment and Plan: Principal problem Crohn's ileocolitis with small bowel obstruction-status post ex lap on 5/9 by Dr. Zadie Herter, underwent LOA - Discussed with gastroenterology, scan reviewed, no evidence of active disease.  She is on Entyvio  as an outpatient and she got it about a week ago - Management per general surgery at this point, NG tube out, plan to advance diet per surgery. Remains on clears for now   Active problems Leukocytosis-perioperatively.  Monitor.  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  She would benefit from weight loss   Subjective: Still having abd discomfort, remains with decreased appetite, does not like Jell-O  Physical Exam: Vitals:   05/01/24 0019 05/01/24 0537 05/01/24 0837 05/01/24 1627  BP:  124/84 (!) 142/107 (!) 140/121  Pulse:  65 87 79  Resp:  17 17   Temp: 98.4 F (36.9 C) 97.8 F (36.6 C) 98.2 F (36.8 C) 97.8 F (36.6 C)  TempSrc: Oral Oral Oral Oral  SpO2:  100% 99% 100%  Weight:      Height:       General exam: Awake, laying in bed, in nad Respiratory system: Normal respiratory effort, no wheezing Cardiovascular system: regular rate, s1, s2 Gastrointestinal system: Soft, nondistended, positive BS Central nervous system: CN2-12 grossly intact, strength intact Extremities: Perfused, no clubbing Skin: Normal skin turgor, no notable skin lesions seen Psychiatry: Mood normal // no visual hallucinations   Data Reviewed:  There are no new results to review at this time.  Family Communication: Pt in room, family at  bedside  Disposition: Status is: Inpatient Remains inpatient appropriate because: severity of illness  Planned Discharge Destination: Home    Author: Cherylle Corwin, MD 05/01/2024 6:14 PM  For on call review [email or web address].

## 2024-05-02 DIAGNOSIS — K50912 Crohn's disease, unspecified, with intestinal obstruction: Secondary | ICD-10-CM | POA: Diagnosis not present

## 2024-05-02 DIAGNOSIS — K56609 Unspecified intestinal obstruction, unspecified as to partial versus complete obstruction: Secondary | ICD-10-CM | POA: Diagnosis not present

## 2024-05-02 DIAGNOSIS — K50812 Crohn's disease of both small and large intestine with intestinal obstruction: Secondary | ICD-10-CM | POA: Diagnosis not present

## 2024-05-02 LAB — COMPREHENSIVE METABOLIC PANEL WITH GFR
ALT: 18 U/L (ref 0–44)
AST: 21 U/L (ref 15–41)
Albumin: 3.1 g/dL — ABNORMAL LOW (ref 3.5–5.0)
Alkaline Phosphatase: 57 U/L (ref 38–126)
Anion gap: 12 (ref 5–15)
BUN: <5 mg/dL — ABNORMAL LOW (ref 6–20)
CO2: 24 mmol/L (ref 22–32)
Calcium: 8.8 mg/dL — ABNORMAL LOW (ref 8.9–10.3)
Chloride: 95 mmol/L — ABNORMAL LOW (ref 98–111)
Creatinine, Ser: 0.64 mg/dL (ref 0.44–1.00)
GFR, Estimated: >60 mL/min (ref >60)
Glucose, Bld: 90 mg/dL (ref 70–99)
Potassium: 4.1 mmol/L (ref 3.5–5.1)
Sodium: 131 mmol/L — ABNORMAL LOW (ref 135–145)
Total Bilirubin: 0.7 mg/dL (ref 0.0–1.2)
Total Protein: 7.5 g/dL (ref 6.5–8.1)

## 2024-05-02 LAB — CBC
HCT: 38.4 % (ref 36.0–46.0)
Hemoglobin: 12.6 g/dL (ref 12.0–15.0)
MCH: 28.8 pg (ref 26.0–34.0)
MCHC: 32.8 g/dL (ref 30.0–36.0)
MCV: 87.7 fL (ref 80.0–100.0)
Platelets: 343 10*3/uL (ref 150–400)
RBC: 4.38 MIL/uL (ref 3.87–5.11)
RDW: 13.2 % (ref 11.5–15.5)
WBC: 6.6 10*3/uL (ref 4.0–10.5)
nRBC: 0 % (ref 0.0–0.2)

## 2024-05-02 MED ORDER — NYSTATIN 100000 UNIT/ML MT SUSP
5.0000 mL | Freq: Four times a day (QID) | OROMUCOSAL | Status: DC
  Administered 2024-05-02 – 2024-05-08 (×24): 500000 [IU] via ORAL
  Filled 2024-05-02 (×22): qty 5

## 2024-05-02 MED ORDER — ENSURE ENLIVE PO LIQD
237.0000 mL | Freq: Two times a day (BID) | ORAL | Status: DC
  Administered 2024-05-02 – 2024-05-03 (×3): 237 mL via ORAL

## 2024-05-02 NOTE — Progress Notes (Signed)
  Progress Note   Patient: Jessica [name]:[id number] DOB: 08-12-1996 DOA: 04/26/2024     6 DOS: the patient was seen and examined on 05/02/2024   Brief hospital course: Jessica [name] is a 28 y.o. 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  Assessment and Plan: Principal problem Crohn's ileocolitis with small bowel obstruction-status post ex lap on 5/9 by Dr. Zadie Herter, underwent LOA - Discussed with gastroenterology, scan reviewed, no evidence of active disease.  She is on Entyvio  as an outpatient and she got it about a week ago - Management per general surgery at this point, NG tube out, advancing diet per Surgery. Now on full liquid   Active problems Leukocytosis-perioperatively.  Monitor.  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  She would benefit from weight loss   Subjective: Reports tolerating clears. Passing flatus  Physical Exam: Vitals:   05/01/24 0837 05/01/24 1627 05/01/24 2103 05/02/24 0900  BP: (!) 142/107 (!) 140/121 (!) 148/111 (!) 124/99  Pulse: 87 79 85 89  Resp: 17  18 19   Temp: 98.2 F (36.8 C) 97.8 F (36.6 C) 98.3 F (36.8 C) 98.4 F (36.9 C)  TempSrc: Oral Oral Oral Oral  SpO2: 99% 100% 100% 97%  Weight:      Height:       General exam: Conversant, in no acute distress Respiratory system: normal chest rise, clear, no audible wheezing Cardiovascular system: regular rhythm, s1-s2 Gastrointestinal system: Nondistended, nontender, pos BS Central nervous system: No seizures, no tremors Extremities: No cyanosis, no joint deformities Skin: No rashes, no pallor Psychiatry: Affect normal // no auditory hallucinations   Data Reviewed:  Labs reviewed: Na 131, K 4.1, Cr 0.64, WBC 6.6, Hgb, 12.6, Plts 343  Family Communication: Pt in room, family not at  bedside  Disposition: Status is: Inpatient Remains inpatient appropriate because: severity of illness  Planned Discharge Destination: Home    Author: Cherylle Corwin, MD 05/02/2024 4:21 PM  For on call review [email or web address].

## 2024-05-02 NOTE — Plan of Care (Signed)
  Problem: Health Behavior/Discharge Planning: Goal: Ability to manage health-related needs will improve Outcome: Progressing   Problem: Clinical Measurements: Goal: Ability to maintain clinical measurements within normal limits will improve Outcome: Progressing Goal: Will remain free from infection Outcome: Progressing Goal: Diagnostic test results will improve Outcome: Progressing Goal: Respiratory complications will improve Outcome: Progressing Goal: Cardiovascular complication will be avoided Outcome: Progressing   Problem: Activity: Goal: Risk for activity intolerance will decrease Outcome: Progressing   Problem: Nutrition: Goal: Adequate nutrition will be maintained Outcome: Progressing   Problem: Coping: Goal: Level of anxiety will decrease Outcome: Progressing   Problem: Elimination: Goal: Will not experience complications related to bowel motility Outcome: Progressing Goal: Will not experience complications related to urinary retention Outcome: Progressing   Problem: Pain Managment: Goal: General experience of comfort will improve and/or be controlled Outcome: Progressing   Problem: Safety: Goal: Ability to remain free from injury will improve Outcome: Progressing   Problem: Skin Integrity: Goal: Risk for impaired skin integrity will decrease Outcome: Progressing   Problem: Education: Goal: Required Educational Video(s) Outcome: Progressing   Problem: Clinical Measurements: Goal: Ability to maintain clinical measurements within normal limits will improve Outcome: Progressing Goal: Postoperative complications will be avoided or minimized Outcome: Progressing   Problem: Skin Integrity: Goal: Demonstration of wound healing without infection will improve Outcome: Progressing

## 2024-05-03 ENCOUNTER — Inpatient Hospital Stay (HOSPITAL_COMMUNITY)

## 2024-05-03 DIAGNOSIS — K567 Ileus, unspecified: Secondary | ICD-10-CM | POA: Diagnosis not present

## 2024-05-03 DIAGNOSIS — K56609 Unspecified intestinal obstruction, unspecified as to partial versus complete obstruction: Secondary | ICD-10-CM | POA: Diagnosis not present

## 2024-05-03 DIAGNOSIS — K50912 Crohn's disease, unspecified, with intestinal obstruction: Secondary | ICD-10-CM | POA: Diagnosis not present

## 2024-05-03 DIAGNOSIS — K50812 Crohn's disease of both small and large intestine with intestinal obstruction: Secondary | ICD-10-CM | POA: Diagnosis not present

## 2024-05-03 LAB — CBC
HCT: 36.6 % (ref 36.0–46.0)
Hemoglobin: 12.1 g/dL (ref 12.0–15.0)
MCH: 28.6 pg (ref 26.0–34.0)
MCHC: 33.1 g/dL (ref 30.0–36.0)
MCV: 86.5 fL (ref 80.0–100.0)
Platelets: 317 10*3/uL (ref 150–400)
RBC: 4.23 MIL/uL (ref 3.87–5.11)
RDW: 13.1 % (ref 11.5–15.5)
WBC: 7 10*3/uL (ref 4.0–10.5)
nRBC: 0 % (ref 0.0–0.2)

## 2024-05-03 LAB — COMPREHENSIVE METABOLIC PANEL WITH GFR
ALT: 29 U/L (ref 0–44)
AST: 37 U/L (ref 15–41)
Albumin: 3 g/dL — ABNORMAL LOW (ref 3.5–5.0)
Alkaline Phosphatase: 80 U/L (ref 38–126)
Anion gap: 10 (ref 5–15)
BUN: <5 mg/dL — ABNORMAL LOW (ref 6–20)
CO2: 25 mmol/L (ref 22–32)
Calcium: 8.8 mg/dL — ABNORMAL LOW (ref 8.9–10.3)
Chloride: 98 mmol/L (ref 98–111)
Creatinine, Ser: 0.54 mg/dL (ref 0.44–1.00)
GFR, Estimated: >60 mL/min (ref >60)
Glucose, Bld: 92 mg/dL (ref 70–99)
Potassium: 3.5 mmol/L (ref 3.5–5.1)
Sodium: 133 mmol/L — ABNORMAL LOW (ref 135–145)
Total Bilirubin: 0.7 mg/dL (ref 0.0–1.2)
Total Protein: 7.4 g/dL (ref 6.5–8.1)

## 2024-05-03 MED ORDER — BOOST / RESOURCE BREEZE PO LIQD CUSTOM: 1.0000 | Freq: Three times a day (TID) | ORAL | Status: DC

## 2024-05-03 MED ORDER — SIMETHICONE 80 MG PO CHEW
80.0000 mg | CHEWABLE_TABLET | Freq: Four times a day (QID) | ORAL | Status: DC | PRN
  Administered 2024-05-03: 80 mg via ORAL
  Filled 2024-05-03: qty 1

## 2024-05-03 MED ORDER — ORAL CARE MOUTH RINSE: 15.0000 mL | OROMUCOSAL | Status: DC | PRN

## 2024-05-03 NOTE — Plan of Care (Signed)
  Problem: Health Behavior/Discharge Planning: Goal: Ability to manage health-related needs will improve Outcome: Progressing   Problem: Clinical Measurements: Goal: Ability to maintain clinical measurements within normal limits will improve Outcome: Progressing Goal: Will remain free from infection Outcome: Progressing Goal: Diagnostic test results will improve Outcome: Progressing Goal: Respiratory complications will improve Outcome: Progressing Goal: Cardiovascular complication will be avoided Outcome: Progressing   Problem: Activity: Goal: Risk for activity intolerance will decrease Outcome: Progressing   Problem: Nutrition: Goal: Adequate nutrition will be maintained Outcome: Progressing   Problem: Coping: Goal: Level of anxiety will decrease Outcome: Progressing   Problem: Elimination: Goal: Will not experience complications related to bowel motility Outcome: Progressing Goal: Will not experience complications related to urinary retention Outcome: Progressing   Problem: Pain Managment: Goal: General experience of comfort will improve and/or be controlled Outcome: Progressing   Problem: Safety: Goal: Ability to remain free from injury will improve Outcome: Progressing   Problem: Skin Integrity: Goal: Risk for impaired skin integrity will decrease Outcome: Progressing   Problem: Education: Goal: Required Educational Video(s) Outcome: Progressing   Problem: Clinical Measurements: Goal: Ability to maintain clinical measurements within normal limits will improve Outcome: Progressing Goal: Postoperative complications will be avoided or minimized Outcome: Progressing   Problem: Skin Integrity: Goal: Demonstration of wound healing without infection will improve Outcome: Progressing

## 2024-05-03 NOTE — Progress Notes (Signed)
 7 Days Post-Op   Subjective/Chief Complaint: Some nausea, still with flatus, thinks ensure was bothering her   Objective: Vital signs in last 24 hours: Temp:  [97.5 F (36.4 C)-98.1 F (36.7 C)] 98 F (36.7 C) (05/16 0752) Pulse Rate:  [84-104] 104 (05/16 0752) Resp:  [16-19] 18 (05/16 0752) BP: (133-157)/(101-119) 133/101 (05/16 0752) SpO2:  [98 %-100 %] 98 % (05/16 0752) Last BM Date :  (passing gas)  Intake/Output from previous day: 05/15 0701 - 05/16 0700 In: 600 [P.O.:600] Out: 0  Intake/Output this shift: Total I/O In: 100 [P.O.:100] Out: 0   Ab mild distended approp tender dressing clean  Lab Results:  Recent Labs    05/02/24 0511 05/03/24 0626  WBC 6.6 7.0  HGB 12.6 12.1  HCT 38.4 36.6  PLT 343 317   BMET Recent Labs    05/02/24 0511 05/03/24 0626  NA 131* 133*  K 4.1 3.5  CL 95* 98  CO2 24 25  GLUCOSE 90 92  BUN <5* <5*  CREATININE 0.64 0.54  CALCIUM 8.8* 8.8*   PT/INR No results for input(s): "LABPROT", "INR" in the last 72 hours. ABG No results for input(s): "PHART", "HCO3" in the last 72 hours.  Invalid input(s): "PCO2", "PO2"  Studies/Results: No results found.  Anti-infectives: Anti-infectives (From admission, onward)    Start     Dose/Rate Route Frequency Ordered Stop   04/27/24 0600  cefoTEtan  (CEFOTAN ) 2 g in sodium chloride  0.9 % 100 mL IVPB        2 g 200 mL/hr over 30 Minutes Intravenous On call to O.R. 04/26/24 2214 04/26/24 2336   04/26/24 2234  sodium chloride  0.9 % with cefoTEtan  (CEFOTAN ) ADS Med       Note to Pharmacy: Jessica [name] T: cabinet override      04/26/24 2234 04/26/24 2316       Assessment/Plan: POD 5 - status post ex lap with lysis of adhesions - LK 04/26/24 - having flatus, small BM - tolerating clears but ongoing mild distention along with nausea, continue CLD for now. - Crohn's Rx per [facility] - encouraged OOB, ambulation -will check xray today- I think just slowly coming along  Jessica [name] 05/03/2024

## 2024-05-03 NOTE — Progress Notes (Signed)
  Progress Note   Patient: Jessica [name] ZHY:[id number] DOB: 06/27/1996 DOA: 04/26/2024     7 DOS: the patient was seen and examined on 05/03/2024   Brief hospital course: Jessica [name] is a 28 y.o. 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  Assessment and Plan: Principal problem Crohn's ileocolitis with small bowel obstruction-status post ex lap on 5/9 by Dr. Zadie [name], underwent LOA - Discussed with gastroenterology, scan reviewed, no evidence of active disease.  She is on Entyvio  as an outpatient and she got it about a week ago - Management per general surgery at this point, NG tube out, did not tolerate full liquids overnight. Now back on clears per Surgery   Active problems Leukocytosis-perioperatively.  Monitor.  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  She would benefit from weight loss   Subjective: Reported increased nausea and abd discomfort overnight  Physical Exam: Vitals:   05/02/24 1719 05/02/24 2101 05/03/24 0459 05/03/24 0752  BP: (!) 157/116 (!) 139/105 (!) 146/119 (!) 133/101  Pulse: 99 84 (!) 103 (!) 104  Resp: 19 16 18 18   Temp: 98.1 F (36.7 C) (!) 97.5 F (36.4 C) 98 F (36.7 C) 98 F (36.7 C)  TempSrc: Oral Oral  Oral  SpO2: 99% 100% 100% 98%  Weight:      Height:       General exam: Conversant, in no acute distress Respiratory system: normal chest rise, clear, no audible wheezing Cardiovascular system: regular rhythm, s1-s2 Gastrointestinal system: Nondistended, nontender, pos BS Central nervous system: No seizures, no tremors Extremities: No cyanosis, no joint deformities Skin: No rashes, no pallor Psychiatry: Affect normal // no auditory hallucinations   Data Reviewed:  Labs reviewed: Na 133, K 3.5, Cr 0.54, WBC 7.0, Hgb 12.1, Plts 317  Family Communication: Pt in room, family  not at bedside  Disposition: Status is: Inpatient Remains inpatient appropriate because: severity of illness  Planned Discharge Destination: Home    Author: Cherylle Corwin, MD 05/03/2024 9:48 AM  For on call review [email or web address].

## 2024-05-03 NOTE — Progress Notes (Signed)
 7 Days Post-Op   Subjective/Chief Complaint: Overall stable. Increased flatus. No vomiting. Mild nausea improves with meds.   Objective: Vital signs in last 24 hours: Temp:  [97.5 F (36.4 C)-98.1 F (36.7 C)] 98 F (36.7 C) (05/16 0752) Pulse Rate:  [84-104] 104 (05/16 0752) Resp:  [16-19] 18 (05/16 0752) BP: (133-157)/(101-119) 133/101 (05/16 0752) SpO2:  [98 %-100 %] 98 % (05/16 0752) Last BM Date :  (passing gas)  Intake/Output from previous day: 05/15 0701 - 05/16 0700 In: 600 [P.O.:600] Out: 0  Intake/Output this shift: No intake/output data recorded.  Ab with some distention, dressing c/d/i, soft approp tender  Lab Results:  Recent Labs    05/02/24 0511 05/03/24 0626  WBC 6.6 7.0  HGB 12.6 12.1  HCT 38.4 36.6  PLT 343 317   BMET Recent Labs    05/02/24 0511 05/03/24 0626  NA 131* 133*  K 4.1 3.5  CL 95* 98  CO2 24 25  GLUCOSE 90 92  BUN <5* <5*  CREATININE 0.64 0.54  CALCIUM 8.8* 8.8*   PT/INR No results for input(s): "LABPROT", "INR" in the last 72 hours. ABG No results for input(s): "PHART", "HCO3" in the last 72 hours.  Invalid input(s): "PCO2", "PO2"  Studies/Results: No results found.  Anti-infectives: Anti-infectives (From admission, onward)    Start     Dose/Rate Route Frequency Ordered Stop   04/27/24 0600  cefoTEtan  (CEFOTAN ) 2 g in sodium chloride  0.9 % 100 mL IVPB        2 g 200 mL/hr over 30 Minutes Intravenous On call to O.R. 04/26/24 2214 04/26/24 2336   04/26/24 2234  sodium chloride  0.9 % with cefoTEtan  (CEFOTAN ) ADS Med       Note to Pharmacy: [name] T: cabinet override      04/26/24 2234 04/26/24 2316       Assessment/Plan: POD 5 - status post ex lap with lysis of adhesions - LK 04/26/24 - having flatus, small BM - tolerating clears, advance to FLD/Ensure - i PO pain control - tylenol  QID, PO robaxin QID, oxy 5-10 mg q 4h PRN, dilaudid  for breakthrough. - Crohn's Rx per [facility] - encouraged OOB,  ambulation  Charlott Converse 05/03/2024

## 2024-05-03 NOTE — Plan of Care (Signed)
   Problem: Health Behavior/Discharge Planning: Goal: Ability to manage health-related needs will improve Outcome: Completed/Met

## 2024-05-04 DIAGNOSIS — K56609 Unspecified intestinal obstruction, unspecified as to partial versus complete obstruction: Secondary | ICD-10-CM | POA: Diagnosis not present

## 2024-05-04 DIAGNOSIS — K50812 Crohn's disease of both small and large intestine with intestinal obstruction: Secondary | ICD-10-CM | POA: Diagnosis not present

## 2024-05-04 DIAGNOSIS — K50912 Crohn's disease, unspecified, with intestinal obstruction: Secondary | ICD-10-CM | POA: Diagnosis not present

## 2024-05-04 LAB — CBC
HCT: 37.2 % (ref 36.0–46.0)
Hemoglobin: 12.5 g/dL (ref 12.0–15.0)
MCH: 28.6 pg (ref 26.0–34.0)
MCHC: 33.6 g/dL (ref 30.0–36.0)
MCV: 85.1 fL (ref 80.0–100.0)
Platelets: 328 10*3/uL (ref 150–400)
RBC: 4.37 MIL/uL (ref 3.87–5.11)
RDW: 13 % (ref 11.5–15.5)
WBC: 7.9 10*3/uL (ref 4.0–10.5)
nRBC: 0 % (ref 0.0–0.2)

## 2024-05-04 LAB — COMPREHENSIVE METABOLIC PANEL WITH GFR
ALT: 46 U/L — ABNORMAL HIGH (ref 0–44)
AST: 52 U/L — ABNORMAL HIGH (ref 15–41)
Albumin: 3.1 g/dL — ABNORMAL LOW (ref 3.5–5.0)
Alkaline Phosphatase: 97 U/L (ref 38–126)
Anion gap: 11 (ref 5–15)
BUN: <5 mg/dL — ABNORMAL LOW (ref 6–20)
CO2: 23 mmol/L (ref 22–32)
Calcium: 8.7 mg/dL — ABNORMAL LOW (ref 8.9–10.3)
Chloride: 97 mmol/L — ABNORMAL LOW (ref 98–111)
Creatinine, Ser: 0.6 mg/dL (ref 0.44–1.00)
GFR, Estimated: >60 mL/min (ref >60)
Glucose, Bld: 88 mg/dL (ref 70–99)
Potassium: 3.7 mmol/L (ref 3.5–5.1)
Sodium: 131 mmol/L — ABNORMAL LOW (ref 135–145)
Total Bilirubin: 0.9 mg/dL (ref 0.0–1.2)
Total Protein: 7.6 g/dL (ref 6.5–8.1)

## 2024-05-04 MED ORDER — ALUM & MAG HYDROXIDE-SIMETH 200-200-20 MG/5ML PO SUSP: 30.0000 mL | Freq: Four times a day (QID) | ORAL | Status: DC | PRN

## 2024-05-04 MED ORDER — PHENOL 1.4 % MT LIQD: 2.0000 | OROMUCOSAL | Status: DC | PRN

## 2024-05-04 MED ORDER — ADULT MULTIVITAMIN W/MINERALS CH
1.0000 | ORAL_TABLET | Freq: Every day | ORAL | Status: DC
  Administered 2024-05-05 – 2024-05-09 (×5): 1 via ORAL
  Filled 2024-05-04 (×5): qty 1

## 2024-05-04 MED ORDER — DICYCLOMINE HCL 10 MG PO CAPS: 10.0000 mg | ORAL_CAPSULE | Freq: Three times a day (TID) | ORAL | Status: DC | PRN

## 2024-05-04 MED ORDER — CALCIUM POLYCARBOPHIL 625 MG PO TABS
625.0000 mg | ORAL_TABLET | Freq: Two times a day (BID) | ORAL | Status: DC
  Administered 2024-05-04 – 2024-05-05 (×3): 625 mg via ORAL
  Filled 2024-05-04 (×4): qty 1

## 2024-05-04 MED ORDER — LACTATED RINGERS IV BOLUS: 1000.0000 mL | Freq: Three times a day (TID) | INTRAVENOUS | Status: AC | PRN

## 2024-05-04 MED ORDER — NAPHAZOLINE-GLYCERIN 0.012-0.25 % OP SOLN: 1.0000 [drp] | Freq: Four times a day (QID) | OPHTHALMIC | Status: DC | PRN

## 2024-05-04 MED ORDER — BOOST PLUS PO LIQD
237.0000 mL | Freq: Two times a day (BID) | ORAL | Status: DC
  Administered 2024-05-04 (×2): 237 mL via ORAL
  Filled 2024-05-04 (×2): qty 237

## 2024-05-04 MED ORDER — SALINE SPRAY 0.65 % NA SOLN: 1.0000 | Freq: Four times a day (QID) | NASAL | Status: DC | PRN

## 2024-05-04 MED ORDER — BISACODYL 10 MG RE SUPP
10.0000 mg | Freq: Two times a day (BID) | RECTAL | Status: DC | PRN
  Administered 2024-05-05: 10 mg via RECTAL
  Filled 2024-05-04: qty 1

## 2024-05-04 MED ORDER — MAGIC MOUTHWASH: 15.0000 mL | Freq: Four times a day (QID) | ORAL | Status: DC | PRN

## 2024-05-04 MED ORDER — ORAL CARE MOUTH RINSE: 15.0000 mL | OROMUCOSAL | Status: DC | PRN

## 2024-05-04 MED ORDER — HYDROMORPHONE HCL 1 MG/ML IJ SOLN
0.5000 mg | INTRAMUSCULAR | Status: DC | PRN
  Administered 2024-05-04 – 2024-05-09 (×19): 1 mg via INTRAVENOUS
  Filled 2024-05-04 (×19): qty 1

## 2024-05-04 MED ORDER — KATE FARMS STANDARD 1.4 PO LIQD
325.0000 mL | Freq: Two times a day (BID) | ORAL | Status: DC
  Administered 2024-05-04 – 2024-05-08 (×8): 325 mL via ORAL
  Filled 2024-05-04 (×11): qty 325

## 2024-05-04 NOTE — Progress Notes (Signed)
 Initial Nutrition Assessment  DOCUMENTATION CODES:   Obesity unspecified  INTERVENTION:  - Full Liquid diet per Surgery.   - Assistance with ordering. [employer] 1.4 (plant based) PO BID, each supplement provides 455 kcal and 20 grams protein. - Encourage intake at all meals and of supplements as tolerated. - Monitor weight trends.   NUTRITION DIAGNOSIS:   Inadequate oral intake related to altered GI function (SBO) as evidenced by energy intake < or equal to 50% for > or equal to 5 days.  GOAL:   Patient will meet greater than or equal to 90% of their needs  MONITOR:   PO intake, Supplement acceptance, Diet advancement, Weight trends  REASON FOR ASSESSMENT:   Consult Assessment of nutrition requirement/status, Wound healing, Other (Comment) (Intolerance to ensure ?lactose?)  ASSESSMENT:   28 y.o. female with PMH significant for Crohn's ileocolitis (s/p remote ileal resection) on Entyvio  who is admitted with partial small bowel obstruction.  5/9 Admit; OR for lysis of adhesions, NGT placed for LIS 5/12 NGT removed 5/13 CLD 5/14 FLD -> CLD 5/15 FLD 5/16 CLD 5/17 FLD  RD working remotely. Called patient via bedside telephone.  She reports a UBW of 200# and denies any significant changes in weight. Per EMR, weight stable the past 6 months.  Patient reports eating 2-3 meals a day at home. Usually has fruit or eggs with sausage and breakfast potatoes for breakfast, usually skips lunch but if eats will have leftovers from dinner the night before, and dinner is usually a homemade meal such as chicken alfredo or steak with rice.  Eating normally up until 2 days PTA.  Patient has been on clears and fulls since 5/13. Documented to be consuming 25-50% of meals. On a FLD now. Had creamy potato soup and jello today and so far is tolerating with no nausea or discomfort.   Consult received to determine different ONS. Patient reports not tolerating Ensure very well and notes she  doesn't drink a lot of milk. She tried Boost Plus and felt it was okay, but only had a little and notes she only really likes vanilla. Patient agreeable to try [employer] for plant based ONS.  Patient also reports not able to eat very well as she often gets foods she doesn't like. Also doesn't have a menu. Will change patient to assist and messaged RN about getting patient a menu.   Medications reviewed and include: Fibercon BID  Labs reviewed:  Na 131   NUTRITION - FOCUSED PHYSICAL EXAM:  RD working remotely  Diet Order:   Diet Order             Diet full liquid Room service appropriate? Yes; Fluid consistency: Thin  Diet effective now                   EDUCATION NEEDS:  Education needs have been addressed  Skin:  Skin Assessment: Skin Integrity Issues: Skin Integrity Issues:: Incisions Incisions: Abdomen  Last BM:  PTA  Height:  Ht Readings from Last 1 Encounters:  04/28/24 5\' 6"  (1.676 m)   Weight:  Wt Readings from Last 1 Encounters:  04/29/24 91.3 kg    BMI:  Body mass index is 32.49 kg/m.  Estimated Nutritional Needs:  Kcal:  2000-2250 kcals Protein:  100-120 grams Fluid:  >/= 2L    Scheryl Cushing RD, LDN Contact via Secure Chat.

## 2024-05-04 NOTE — Progress Notes (Addendum)
 05/04/2024  Jessica [name] [id number] [birth year]  CARE TEAM: PCP: Patient, No Pcp Per  Outpatient Care Team: Patient Care Team: Patient, No Pcp Per as PCP - General (General Practice)  Inpatient Treatment Team: Treatment Team:  Oral Billings, MD Lenise Quince, Md, MD Piedad Brewer, MD Barnes, Josie E, LCSW Hankins, Sequoia S, [place] Burgess Caroline, RN Artelia Laroche, [facility] Cindia Crease, RN Viann Graces, RN   Problem List:   Principal Problem:   Crohn's disease of both small and large intestine with intestinal obstruction [facility])   04/26/2024  Preoperative diagnosis: small bowel obstruction   Postoperative diagnosis: same    Procedure: open lysis of adhesions   Surgeon: Harman Lightning, M.D.  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  Clinically improving.  Tolerating clear liquids.  Will readvance diet.  Try to avoid lactose if possible.  Consider lactose-free boost.  See if nutrition agrees  Crohn's disease without a flare on Entyvio .  Last dose early May 2025.  Holding off on further steroids at this time  -monitor electrolytes & replace as needed  Keep K>4, Mg>2, Phos>3  -VTE prophylaxis- SCDs.  Anticoagulation prophyllaxis SQ as appropriate  -mobilize as tolerated to help recovery.  Enlist therapies in moderate/high risk patients as appropriate  Staples out 2 weeks postop since immunosuppressed = 5/23  I updated the patient's status to the patient  Recommendations were made.  Questions were answered.  She expressed understanding & appreciation.  -Disposition: TBD.  possibly home 5/18-19 if continues to improve       I reviewed hospitalist notes, last 24 h vitals and pain scores, last 48 h intake and output, last  24 h labs and trends, and last 24 h imaging results.  I have reviewed this patient's available data, including medical history, events of note, test results, etc as part of my evaluation.   A significant portion of that time was spent in counseling. Care during the described time interval was provided by me.  This care required moderate level of medical decision making.  05/04/2024    Subjective: (Chief complaint)  Patient tolerated liquids without any nausea or vomiting.  Walking more hallways.  Pain under control.  Got nauseated and cramping with Ensure and just wants to avoid that.  Does not know if she really has a lactose allergy but avoids dairy products at baseline already.  Objective:  Vital signs:  Vitals:   05/03/24 1721 05/03/24 2100 05/04/24 0530 05/04/24 0904  BP: (!) 135/99 (!) 147/108 (!) 132/105 (!) 128/101  Pulse: 93 92 (!) 106 (!) 106  Resp: 19 19 19    Temp: 98.5 F (36.9 C) 98.5 F (36.9 C) 99.9 F (37.7 C) 98.1 F (36.7 C)  TempSrc:   Oral   SpO2: 99% 100% 100% 99%  Weight:      Height:        Last BM Date :  (PTA)  Intake/Output   Yesterday:  05/16 0701 - 05/17 0700 In: 400 [P.O.:400] Out: 0  This shift:  Total I/O In: 240 [P.O.:240] Out: -   Bowel function:  Flatus: YES  BM:  YES  Drain: (No drain)   Physical Exam:  General: Pt awake/alert in no acute distress Eyes: PERRL, normal EOM.  Sclera clear.  No icterus Neuro: CN II-XII intact w/o focal sensory/motor deficits. Lymph: No head/neck/groin lymphadenopathy Psych:  No delerium/psychosis/paranoia.  Oriented x 4 HENT: Normocephalic, Mucus membranes moist.  No thrush Neck: Supple, No tracheal deviation.  No obvious thyromegaly Chest: No pain to chest wall compression.  Good respiratory excursion.  No audible wheezing CV:  Pulses intact.  Regular rhythm.  No major extremity edema MS: Normal AROM mjr joints.  No obvious deformity  Abdomen: Soft.  Nondistended.  Mildly tender at  incisions only.  Midline incision clean dry and intact.  No evidence of peritonitis.  No incarcerated hernias.  Ext:   No deformity.  No mjr edema.  No cyanosis Skin: No petechiae / purpurea.  No major sores.  Warm and dry    Results:   Cultures: No results found for this or any previous visit (from the past 720 hours).  Labs: Results for orders placed or performed during the hospital encounter of 04/26/24 (from the past 48 hours)  Comprehensive metabolic panel with GFR     Status: Abnormal   Collection Time: 05/03/24  6:26 AM  Result Value Ref Range   Sodium 133 (L) 135 - 145 mmol/L   Potassium 3.5 3.5 - 5.1 mmol/L   Chloride 98 98 - 111 mmol/L   CO2 25 22 - 32 mmol/L   Glucose, Bld 92 70 - 99 mg/dL    Comment: Glucose reference range applies only to samples taken after fasting for at least 8 hours.   BUN <5 (L) 6 - 20 mg/dL   Creatinine, Ser 1.61 0.44 - 1.00 mg/dL   Calcium 8.8 (L) 8.9 - 10.3 mg/dL   Total Protein 7.4 6.5 - 8.1 g/dL   Albumin 3.0 (L) 3.5 - 5.0 g/dL   AST 37 15 - 41 U/L   ALT 29 0 - 44 U/L   Alkaline Phosphatase 80 38 - 126 U/L   Total Bilirubin 0.7 0.0 - 1.2 mg/dL   GFR, Estimated >09 >60 mL/min    Comment: (NOTE) Calculated using the CKD-EPI Creatinine Equation (2021)    Anion gap 10 5 - 15    Comment: Performed at [facility], [address] [place], [place] [address]  CBC     Status: None   Collection Time: 05/03/24  6:26 AM  Result Value Ref Range   WBC 7.0 4.0 - 10.5 K/uL   RBC 4.23 3.87 - 5.11 MIL/uL   Hemoglobin 12.1 12.0 - 15.0 g/dL   HCT 81.1 91.4 - 78.2 %   MCV 86.5 80.0 - 100.0 fL   MCH 28.6 26.0 - 34.0 pg   MCHC 33.1 30.0 - 36.0 g/dL   RDW 95.6 21.3 - 08.6 %   Platelets 317 150 - 400 K/uL   nRBC 0.0 0.0 - 0.2 %    Comment: Performed at [facility], [address] [place], [place] [address]  Comprehensive metabolic panel with GFR     Status: Abnormal   Collection Time: 05/04/24  5:12 AM  Result Value Ref Range    Sodium 131 (L) 135 - 145 mmol/L   Potassium 3.7 3.5 - 5.1 mmol/L   Chloride 97 (L) 98 - 111 mmol/L   CO2 23 22 - 32 mmol/L   Glucose, Bld 88 70 - 99 mg/dL    Comment: Glucose reference range applies only to samples taken after fasting for at least 8 hours.   BUN <5 (L) 6 - 20 mg/dL   Creatinine,  Ser 0.60 0.44 - 1.00 mg/dL   Calcium 8.7 (L) 8.9 - 10.3 mg/dL   Total Protein 7.6 6.5 - 8.1 g/dL   Albumin 3.1 (L) 3.5 - 5.0 g/dL   AST 52 (H) 15 - 41 U/L   ALT 46 (H) 0 - 44 U/L   Alkaline Phosphatase 97 38 - 126 U/L   Total Bilirubin 0.9 0.0 - 1.2 mg/dL   GFR, Estimated >16 >10 mL/min    Comment: (NOTE) Calculated using the CKD-EPI Creatinine Equation (2021)    Anion gap 11 5 - 15    Comment: Performed at [facility], [address] [place], [place] [address]  CBC     Status: None   Collection Time: 05/04/24  5:12 AM  Result Value Ref Range   WBC 7.9 4.0 - 10.5 K/uL   RBC 4.37 3.87 - 5.11 MIL/uL   Hemoglobin 12.5 12.0 - 15.0 g/dL   HCT 40.9 81.1 - 91.4 %   MCV 85.1 80.0 - 100.0 fL   MCH 28.6 26.0 - 34.0 pg   MCHC 33.6 30.0 - 36.0 g/dL   RDW 78.2 95.6 - 21.3 %   Platelets 328 150 - 400 K/uL   nRBC 0.0 0.0 - 0.2 %    Comment: Performed at [facility], [address], [place], [place] [address]    Imaging / Studies: DG Abd Portable 1V Result Date: 05/03/2024 CLINICAL DATA:  [id number] Ileus (HCC) [id number] EXAM: PORTABLE ABDOMEN - 1 VIEW COMPARISON:  Apr 26, 2024 FINDINGS: Midline surgical staples. There are several persistent dilated loops of small bowel in the RIGHT hemiabdomen. Representative loop of bowel measures approximately 4 cm in diameter. Visualized lung bases are unremarkable. Tubal ligation clips. IMPRESSION: There are several persistent dilated loops of small bowel in the RIGHT hemiabdomen. Findings are favored to reflect ileus in a postoperative setting. If concern for obstruction, consider serial abdominal radiographs after ingestion of enteric contrast versus  dedicated cross-sectional imaging. Electronically Signed   By: Clancy Crimes M.D.   On: 05/03/2024 16:43    Medications / Allergies: per chart  Antibiotics: Anti-infectives (From admission, onward)    Start     Dose/Rate Route Frequency Ordered Stop   04/27/24 0600  cefoTEtan  (CEFOTAN ) 2 g in sodium chloride  0.9 % 100 mL IVPB        2 g 200 mL/hr over 30 Minutes Intravenous On call to O.R. 04/26/24 2214 04/26/24 2336   04/26/24 2234  sodium chloride  0.9 % with cefoTEtan  (CEFOTAN ) ADS Med       Note to Pharmacy: [name] T: cabinet override      04/26/24 2234 04/26/24 2316         Note: Portions of this report may have been transcribed using voice recognition software. Every effort was made to ensure accuracy; however, inadvertent computerized transcription errors may be present.   Any transcriptional errors that result from this process are unintentional.    Eddye Goodie, MD, FACS, MASCRS Esophageal, Gastrointestinal & Colorectal Surgery Robotic and Minimally Invasive Surgery  [facility] [address] [place], [place] [address] [phone] Fax [phone] Main  CONTACT INFORMATION: Weekday (9AM-5PM): Call [facility] main office at [phone] Weeknight (5PM-9AM) or Weekend/Holiday: Check EPIC "Web Links" tab & use "AMION" (password " [email or web address]") for General Surgery [facility] coverage  Please, DO NOT use SecureChat  (it is not reliable communication to reach operating surgeons & will lead to  a delay in care).   Epic staff messaging available for outptient concerns needing 1-2 business day response.      05/04/2024  10:14 AM

## 2024-05-04 NOTE — Progress Notes (Signed)
  Progress Note   Patient: Jessica [name] [id number]:[id number] DOB: [birth year] DOA: 04/26/2024     8 DOS: the patient was seen and examined on 05/04/2024   Brief hospital course: SHARNELLE CAPPELLI is a 28 y.o. 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  Assessment and Plan: Principal problem Crohn's ileocolitis with small bowel obstruction-status post ex lap on 5/9 by Dr. Zadie Herter, underwent LOA - Discussed with gastroenterology, scan reviewed, no evidence of active disease.  She is on Entyvio  as an outpatient and she got it about a week ago - Noted to have evidence of ileus on 5/16 xray, now improving. Diet being advanced by General Surgery   Active problems Leukocytosis-perioperatively.  Monitor.  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  She would benefit from weight loss   Subjective: Now passing flatus. Ambulating in hallway frequently  Physical Exam: Vitals:   05/03/24 1721 05/03/24 2100 05/04/24 0530 05/04/24 0904  BP: (!) 135/99 (!) 147/108 (!) 132/105 (!) 128/101  Pulse: 93 92 (!) 106 (!) 106  Resp: 19 19 19    Temp: 98.5 F (36.9 C) 98.5 F (36.9 C) 99.9 F (37.7 C) 98.1 F (36.7 C)  TempSrc:   Oral   SpO2: 99% 100% 100% 99%  Weight:      Height:       General exam: Awake, laying in bed, in nad Respiratory system: Normal respiratory effort, no wheezing Cardiovascular system: regular rate, s1, s2 Gastrointestinal system: Soft, nondistended, positive BS Central nervous system: CN2-12 grossly intact, strength intact Extremities: Perfused, no clubbing Skin: Normal skin turgor, no notable skin lesions seen Psychiatry: Mood normal // no visual hallucinations   Data Reviewed:  Labs reviewed: Na 131, K 3.7, Cr 0.60, WBC 7.9, Hgb 12.5, plts 328  Family Communication: Pt in room, family at  bedside  Disposition: Status is: Inpatient Remains inpatient appropriate because: severity of illness  Planned Discharge Destination: Home    Author: Cherylle Corwin, MD 05/04/2024 5:17 PM  For on call review [email or web address].

## 2024-05-05 DIAGNOSIS — K56609 Unspecified intestinal obstruction, unspecified as to partial versus complete obstruction: Secondary | ICD-10-CM | POA: Diagnosis not present

## 2024-05-05 DIAGNOSIS — K50912 Crohn's disease, unspecified, with intestinal obstruction: Secondary | ICD-10-CM | POA: Diagnosis not present

## 2024-05-05 DIAGNOSIS — K50812 Crohn's disease of both small and large intestine with intestinal obstruction: Secondary | ICD-10-CM | POA: Diagnosis not present

## 2024-05-05 LAB — CBC
HCT: 36.8 % (ref 36.0–46.0)
Hemoglobin: 12.3 g/dL (ref 12.0–15.0)
MCH: 28.4 pg (ref 26.0–34.0)
MCHC: 33.4 g/dL (ref 30.0–36.0)
MCV: 85 fL (ref 80.0–100.0)
Platelets: 328 10*3/uL (ref 150–400)
RBC: 4.33 MIL/uL (ref 3.87–5.11)
RDW: 13 % (ref 11.5–15.5)
WBC: 8.1 10*3/uL (ref 4.0–10.5)
nRBC: 0 % (ref 0.0–0.2)

## 2024-05-05 LAB — COMPREHENSIVE METABOLIC PANEL WITH GFR
ALT: 54 U/L — ABNORMAL HIGH (ref 0–44)
AST: 68 U/L — ABNORMAL HIGH (ref 15–41)
Albumin: 3.2 g/dL — ABNORMAL LOW (ref 3.5–5.0)
Alkaline Phosphatase: 114 U/L (ref 38–126)
Anion gap: 14 (ref 5–15)
BUN: <5 mg/dL — ABNORMAL LOW (ref 6–20)
CO2: 20 mmol/L — ABNORMAL LOW (ref 22–32)
Calcium: 8.9 mg/dL (ref 8.9–10.3)
Chloride: 98 mmol/L (ref 98–111)
Creatinine, Ser: 0.55 mg/dL (ref 0.44–1.00)
GFR, Estimated: >60 mL/min (ref >60)
Glucose, Bld: 93 mg/dL (ref 70–99)
Potassium: 4.2 mmol/L (ref 3.5–5.1)
Sodium: 132 mmol/L — ABNORMAL LOW (ref 135–145)
Total Bilirubin: 1.5 mg/dL — ABNORMAL HIGH (ref 0.0–1.2)
Total Protein: 7.6 g/dL (ref 6.5–8.1)

## 2024-05-05 MED ORDER — BISACODYL 10 MG RE SUPP
10.0000 mg | Freq: Every day | RECTAL | Status: DC
  Administered 2024-05-06 – 2024-05-07 (×2): 10 mg via RECTAL
  Filled 2024-05-05 (×4): qty 1

## 2024-05-05 MED ORDER — CALCIUM POLYCARBOPHIL 625 MG PO TABS
1250.0000 mg | ORAL_TABLET | Freq: Two times a day (BID) | ORAL | Status: DC
  Administered 2024-05-05 – 2024-05-09 (×8): 1250 mg via ORAL
  Filled 2024-05-05 (×8): qty 2

## 2024-05-05 NOTE — Progress Notes (Signed)
  Progress Note   Patient: Jessica [name] [id number] DOB: 1996-02-01 DOA: 04/26/2024     9 DOS: the patient was seen and examined on 05/05/2024   Brief hospital course: Jessica [name] is a 28 y.o. 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  Assessment and Plan: Principal problem Crohn's ileocolitis with small bowel obstruction-status post ex lap on 5/9 by Dr. Zadie Herter, underwent LOA - Discussed with gastroenterology, scan reviewed, no evidence of active disease.  She is on Entyvio  as an outpatient and she got it about a week ago - Noted to have evidence of ileus on 5/16 xray, now improving. Advancing diet per General Surgery   Active problems Leukocytosis-perioperatively.  Monitor.  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  She would benefit from weight loss   Subjective: Reports no BM yet, does feel hard "balls" of stool  Physical Exam: Vitals:   05/04/24 1930 05/05/24 0500 05/05/24 0819 05/05/24 1704  BP: (!) 144/105 (!) 140/107 (!) 134/103 134/87  Pulse: 92 95 (!) 110 95  Resp:      Temp: (!) 97.4 F (36.3 C) (!) 97.5 F (36.4 C)    TempSrc:  Oral    SpO2: 100% 100% 98% 100%  Weight:      Height:       General exam: Conversant, in no acute distress Respiratory system: normal chest rise, clear, no audible wheezing Cardiovascular system: regular rhythm, s1-s2 Gastrointestinal system: Nondistended, nontender, pos BS Central nervous system: No seizures, no tremors Extremities: No cyanosis, no joint deformities Skin: No rashes, no pallor Psychiatry: Affect normal // no auditory hallucinations   Data Reviewed:  Labs reviewed: Na 132, K 4.2, Cr 0.55, WBC 8.1, Hgb 12.3  Family Communication: Pt in room, family at bedside  Disposition: Status is: Inpatient Remains inpatient appropriate because: severity of  illness  Planned Discharge Destination: Home    Author: Cherylle Corwin, MD 05/05/2024 5:44 PM  For on call review [email or web address].

## 2024-05-05 NOTE — Plan of Care (Signed)
  Problem: Clinical Measurements: Goal: Ability to maintain clinical measurements within normal limits will improve Outcome: Progressing Goal: Will remain free from infection Outcome: Progressing Goal: Diagnostic test results will improve Outcome: Progressing Goal: Respiratory complications will improve Outcome: Progressing Goal: Cardiovascular complication will be avoided Outcome: Progressing   Problem: Activity: Goal: Risk for activity intolerance will decrease Outcome: Progressing   Problem: Nutrition: Goal: Adequate nutrition will be maintained Outcome: Progressing   Problem: Coping: Goal: Level of anxiety will decrease Outcome: Progressing   Problem: Elimination: Goal: Will not experience complications related to bowel motility Outcome: Progressing Goal: Will not experience complications related to urinary retention Outcome: Progressing   Problem: Pain Managment: Goal: General experience of comfort will improve and/or be controlled Outcome: Progressing   Problem: Safety: Goal: Ability to remain free from injury will improve Outcome: Progressing   Problem: Skin Integrity: Goal: Risk for impaired skin integrity will decrease Outcome: Progressing   Problem: Education: Goal: Required Educational Video(s) Outcome: Progressing   Problem: Clinical Measurements: Goal: Ability to maintain clinical measurements within normal limits will improve Outcome: Progressing Goal: Postoperative complications will be avoided or minimized Outcome: Progressing   Problem: Skin Integrity: Goal: Demonstration of wound healing without infection will improve Outcome: Progressing

## 2024-05-05 NOTE — Progress Notes (Signed)
 05/05/2024  Jessica [name] [id number] 05-25-1996  CARE TEAM: PCP: Patient, No Pcp Per  Outpatient Care Team: Patient Care Team: Patient, No Pcp Per as PCP - General (General Practice)  Inpatient Treatment Team: Treatment Team:  Oral Billings, MD Lenise Quince, Md, MD Piedad Brewer, MD Barnes, Josie E, LCSW Gagliardi, Briana R, RN Maryanna Smart, [place] Horton Maas, RN Artelia Laroche, [facility] Viann Graces, RN   Problem List:   Principal Problem:   Crohn's disease of both small and large intestine with intestinal obstruction [facility])   04/26/2024  Preoperative diagnosis: small bowel obstruction   Postoperative diagnosis: same    Procedure: open lysis of adhesions   Surgeon: Harman Lightning, M.D.  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  Clinically improving.  Tolerating liquids.  Nutrition input for nondairy supplemental shakes.  Increase FiberCon to naturally stimulate bowel movement.  Add daily suppository as well.  Crohn's disease without a flare on Entyvio .  Last dose early May 2025.  Holding off on further steroids at this time  -monitor electrolytes & replace as needed  Keep K>4, Mg>2, Phos>3  -VTE prophylaxis- SCDs.  Anticoagulation prophyllaxis SQ as appropriate  -mobilize as tolerated to help recovery.  Enlist therapies in moderate/high risk patients as appropriate  Staples out 2 weeks postop since immunosuppressed = 5/23  I updated the patient's status to the patient  Recommendations were made.  Questions were answered.  She expressed understanding & appreciation.  -Disposition: TBD.  possibly home 5/19 if continues to improve       I reviewed hospitalist notes, last 24 h vitals and pain scores, last 48 h intake and  output, last 24 h labs and trends, and last 24 h imaging results.  I have reviewed this patient's available data, including medical history, events of note, test results, etc as part of my evaluation.   A significant portion of that time was spent in counseling. Care during the described time interval was provided by me.  This care required moderate level of medical decision making.  05/05/2024    Subjective: (Chief complaint)  Patient tolerated liquids without any nausea or vomiting.  Walking more hallways.  Pain under control.    Objective:  Vital signs:  Vitals:   05/04/24 1737 05/04/24 1930 05/05/24 0500 05/05/24 0819  BP: (!) 136/109 (!) 144/105 (!) 140/107 (!) 134/103  Pulse: (!) 107 92 95 (!) 110  Resp:      Temp: 98.4 F (36.9 C) (!) 97.4 F (36.3 C) (!) 97.5 F (36.4 C)   TempSrc:   Oral   SpO2: 99% 100% 100% 98%  Weight:      Height:        Last BM Date :  (PTA)  Intake/Output   Yesterday:  05/17 0701 - 05/18 0700 In: 360 [P.O.:360] Out: -  This shift:  Total I/O In: 120 [P.O.:120] Out: -   Bowel function:  Flatus: YES  BM:  YES  Drain: (No drain)   Physical Exam:  General: Pt awake/alert in no acute distress Eyes: PERRL, normal EOM.  Sclera clear.  No icterus Neuro: CN II-XII intact w/o focal sensory/motor deficits. Lymph: No head/neck/groin lymphadenopathy Psych:  No delerium/psychosis/paranoia.  Oriented x 4 HENT: Normocephalic, Mucus membranes  moist.  No thrush Neck: Supple, No tracheal deviation.  No obvious thyromegaly Chest: No pain to chest wall compression.  Good respiratory excursion.  No audible wheezing CV:  Pulses intact.  Regular rhythm.  No major extremity edema MS: Normal AROM mjr joints.  No obvious deformity  Abdomen: Soft.  Nondistended.  Nontender.  Midline incision clean dry and intact.  No evidence of peritonitis.  No incarcerated hernias.  Ext:   No deformity.  No mjr edema.  No cyanosis Skin: No petechiae /  purpurea.  No major sores.  Warm and dry    Results:   Cultures: No results found for this or any previous visit (from the past 720 hours).  Labs: Results for orders placed or performed during the hospital encounter of 04/26/24 (from the past 48 hours)  Comprehensive metabolic panel with GFR     Status: Abnormal   Collection Time: 05/04/24  5:12 AM  Result Value Ref Range   Sodium 131 (L) 135 - 145 mmol/L   Potassium 3.7 3.5 - 5.1 mmol/L   Chloride 97 (L) 98 - 111 mmol/L   CO2 23 22 - 32 mmol/L   Glucose, Bld 88 70 - 99 mg/dL    Comment: Glucose reference range applies only to samples taken after fasting for at least 8 hours.   BUN <5 (L) 6 - 20 mg/dL   Creatinine, Ser 5.78 0.44 - 1.00 mg/dL   Calcium 8.7 (L) 8.9 - 10.3 mg/dL   Total Protein 7.6 6.5 - 8.1 g/dL   Albumin 3.1 (L) 3.5 - 5.0 g/dL   AST 52 (H) 15 - 41 U/L   ALT 46 (H) 0 - 44 U/L   Alkaline Phosphatase 97 38 - 126 U/L   Total Bilirubin 0.9 0.0 - 1.2 mg/dL   GFR, Estimated >46 >96 mL/min    Comment: (NOTE) Calculated using the CKD-EPI Creatinine Equation (2021)    Anion gap 11 5 - 15    Comment: Performed at [facility], [address], [place], [place] [address]  CBC     Status: None   Collection Time: 05/04/24  5:12 AM  Result Value Ref Range   WBC 7.9 4.0 - 10.5 K/uL   RBC 4.37 3.87 - 5.11 MIL/uL   Hemoglobin 12.5 12.0 - 15.0 g/dL   HCT 41.3 24.4 - 01.0 %   MCV 85.1 80.0 - 100.0 fL   MCH 28.6 26.0 - 34.0 pg   MCHC 33.6 30.0 - 36.0 g/dL   RDW 27.2 53.6 - 64.4 %   Platelets 328 150 - 400 K/uL   nRBC 0.0 0.0 - 0.2 %    Comment: Performed at [facility], [address], [place], [place] [address]  Comprehensive metabolic panel with GFR     Status: Abnormal   Collection Time: 05/05/24  8:07 AM  Result Value Ref Range   Sodium 132 (L) 135 - 145 mmol/L   Potassium 4.2 3.5 - 5.1 mmol/L    Comment: HEMOLYSIS AT THIS LEVEL MAY AFFECT RESULT   Chloride 98 98 - 111 mmol/L   CO2 20 (L) 22 - 32  mmol/L   Glucose, Bld 93 70 - 99 mg/dL    Comment: Glucose reference range applies only to samples taken after fasting for at least 8 hours.   BUN <5 (L) 6 - 20 mg/dL   Creatinine, Ser 2.59 0.44 - 1.00 mg/dL   Calcium 8.9 8.9 - 56.3 mg/dL   Total Protein 7.6  6.5 - 8.1 g/dL   Albumin 3.2 (L) 3.5 - 5.0 g/dL   AST 68 (H) 15 - 41 U/L    Comment: HEMOLYSIS AT THIS LEVEL MAY AFFECT RESULT   ALT 54 (H) 0 - 44 U/L    Comment: HEMOLYSIS AT THIS LEVEL MAY AFFECT RESULT   Alkaline Phosphatase 114 38 - 126 U/L   Total Bilirubin 1.5 (H) 0.0 - 1.2 mg/dL    Comment: HEMOLYSIS AT THIS LEVEL MAY AFFECT RESULT   GFR, Estimated >60 >60 mL/min    Comment: (NOTE) Calculated using the CKD-EPI Creatinine Equation (2021)    Anion gap 14 5 - 15    Comment: Performed at [facility], [address] [place], [place] [address]  CBC     Status: None   Collection Time: 05/05/24  8:07 AM  Result Value Ref Range   WBC 8.1 4.0 - 10.5 K/uL   RBC 4.33 3.87 - 5.11 MIL/uL   Hemoglobin 12.3 12.0 - 15.0 g/dL   HCT 60.4 54.0 - 98.1 %   MCV 85.0 80.0 - 100.0 fL   MCH 28.4 26.0 - 34.0 pg   MCHC 33.4 30.0 - 36.0 g/dL   RDW 19.1 47.8 - 29.5 %   Platelets 328 150 - 400 K/uL   nRBC 0.0 0.0 - 0.2 %    Comment: Performed at [facility], [address] [place], [place] [address]    Imaging / Studies: DG Abd Portable 1V Result Date: 05/03/2024 CLINICAL DATA:  [id number] Ileus (HCC) [id number] EXAM: PORTABLE ABDOMEN - 1 VIEW COMPARISON:  Apr 26, 2024 FINDINGS: Midline surgical staples. There are several persistent dilated loops of small bowel in the RIGHT hemiabdomen. Representative loop of bowel measures approximately 4 cm in diameter. Visualized lung bases are unremarkable. Tubal ligation clips. IMPRESSION: There are several persistent dilated loops of small bowel in the RIGHT hemiabdomen. Findings are favored to reflect ileus in a postoperative setting. If concern for obstruction, consider serial abdominal radiographs  after ingestion of enteric contrast versus dedicated cross-sectional imaging. Electronically Signed   By: Clancy Crimes M.D.   On: 05/03/2024 16:43    Medications / Allergies: per chart  Antibiotics: Anti-infectives (From admission, onward)    Start     Dose/Rate Route Frequency Ordered Stop   04/27/24 0600  cefoTEtan  (CEFOTAN ) 2 g in sodium chloride  0.9 % 100 mL IVPB        2 g 200 mL/hr over 30 Minutes Intravenous On call to O.R. 04/26/24 2214 04/26/24 2336   04/26/24 2234  sodium chloride  0.9 % with cefoTEtan  (CEFOTAN ) ADS Med       Note to Pharmacy: [name] T: cabinet override      04/26/24 2234 04/26/24 2316         Note: Portions of this report may have been transcribed using voice recognition software. Every effort was made to ensure accuracy; however, inadvertent computerized transcription errors may be present.   Any transcriptional errors that result from this process are unintentional.    Eddye Goodie, MD, FACS, MASCRS Esophageal, Gastrointestinal & Colorectal Surgery Robotic and Minimally Invasive Surgery  [facility] [address] [place], [place] [address] [phone] Fax [phone] Main  CONTACT INFORMATION: Weekday (9AM-5PM): Call [facility] main office at [phone] Weeknight (5PM-9AM) or Weekend/Holiday: Check EPIC "Web Links" tab & use "AMION" (password " [email or web address]") for General Surgery [facility] coverage  Please, DO NOT use SecureChat  (  it is not reliable communication to reach operating surgeons & will lead to a delay in care).   Epic staff messaging available for outptient concerns needing 1-2 business day response.      05/05/2024  12:52 PM

## 2024-05-06 ENCOUNTER — Ambulatory Visit: Admitting: Gastroenterology

## 2024-05-06 DIAGNOSIS — K56609 Unspecified intestinal obstruction, unspecified as to partial versus complete obstruction: Secondary | ICD-10-CM | POA: Diagnosis not present

## 2024-05-06 DIAGNOSIS — K50812 Crohn's disease of both small and large intestine with intestinal obstruction: Secondary | ICD-10-CM | POA: Diagnosis not present

## 2024-05-06 DIAGNOSIS — K50912 Crohn's disease, unspecified, with intestinal obstruction: Secondary | ICD-10-CM | POA: Diagnosis not present

## 2024-05-06 LAB — CBC
HCT: 38.3 % (ref 36.0–46.0)
Hemoglobin: 12.5 g/dL (ref 12.0–15.0)
MCH: 27.8 pg (ref 26.0–34.0)
MCHC: 32.6 g/dL (ref 30.0–36.0)
MCV: 85.3 fL (ref 80.0–100.0)
Platelets: 322 10*3/uL (ref 150–400)
RBC: 4.49 MIL/uL (ref 3.87–5.11)
RDW: 13.2 % (ref 11.5–15.5)
WBC: 7.4 10*3/uL (ref 4.0–10.5)
nRBC: 0 % (ref 0.0–0.2)

## 2024-05-06 LAB — COMPREHENSIVE METABOLIC PANEL WITH GFR
ALT: 48 U/L — ABNORMAL HIGH (ref 0–44)
AST: 38 U/L (ref 15–41)
Albumin: 3.2 g/dL — ABNORMAL LOW (ref 3.5–5.0)
Alkaline Phosphatase: 112 U/L (ref 38–126)
Anion gap: 9 (ref 5–15)
BUN: <5 mg/dL — ABNORMAL LOW (ref 6–20)
CO2: 25 mmol/L (ref 22–32)
Calcium: 9.1 mg/dL (ref 8.9–10.3)
Chloride: 98 mmol/L (ref 98–111)
Creatinine, Ser: 0.56 mg/dL (ref 0.44–1.00)
GFR, Estimated: >60 mL/min (ref >60)
Glucose, Bld: 105 mg/dL — ABNORMAL HIGH (ref 70–99)
Potassium: 3.5 mmol/L (ref 3.5–5.1)
Sodium: 132 mmol/L — ABNORMAL LOW (ref 135–145)
Total Bilirubin: 0.7 mg/dL (ref 0.0–1.2)
Total Protein: 8.3 g/dL — ABNORMAL HIGH (ref 6.5–8.1)

## 2024-05-06 MED ORDER — METHOCARBAMOL 500 MG PO TABS
1000.0000 mg | ORAL_TABLET | Freq: Four times a day (QID) | ORAL | Status: DC
  Administered 2024-05-06 – 2024-05-09 (×12): 1000 mg via ORAL
  Filled 2024-05-06 (×12): qty 2

## 2024-05-06 NOTE — Progress Notes (Deleted)
 HPI :  Crohn's history History of Crohns diagnosed in 2012. Ileocolonic disease. She had a surgery with ileal resection with divertiing ileostomy at age 28 for emergency surgery, she thinks due to a perforation although details of this and indication for it are unclear. She was hospitalized for diversion colitis in 2015. Ileostomy takedown was done in 2016. She had a post-operative colonoscopy in February 2016 showing mildly active disease, and was placed on Humira  in March 2016. She developed antibody on this regimen, Humira  was increased and started on . The antibody level went away but Humira  level remained low despite optimal dosing. Started on Entyvio  November 2018. Follow up colonoscopy in 2019 looked normal. Subtherapeutic Entyvio  levels 2023, increased to once monthly dosing.     SINCE LAST VISIT: 28 year old Jessica [name] here for follow-up visit today.  I am connecting with her virtually due to issues with childcare and that she has a cold today.  Recall that back in February she had subtherapeutic Entyvio  level of 3 with no antibodies.  Her colonoscopy showed single ulcer at surgical anastomosis I felt more consistent with an anastomotic ulcer rather than active Crohn's disease.  The rest of her small intestine and colon were normal.   We increased her Entyvio  level to once every 4 weeks and she states that she felt really well on the regimen and felt that her Crohn's was well controlled.  She has missed her last 2 doses due to illness/sick contacts in her home.  Her child was hospitalized with an E. coli enteritis, she also felt that she had a viral gastroenteritis at 1 point time and so she is overdue for her next infusion.  She has a cold the right now but otherwise feels well.  Her weights been stable.  She has had some occasional pains in her abdomen but not severe.  Bentyl  helps relieve this.  She had at 1 point some constipation that was bothering her but that has since resolved and more so  occasional loose stools at this point time.  She appears to tolerate the Entyvio  well although continues to have some migraine headaches with intermittent frequency.  Recall I put her on some Elavil  years ago for migraine headaches but this made her a bit drowsy and had a hard time tolerating it during the day.  She has not seen a primary care in some time and has no primary care currently.  We discussed her options for this.  She has never been on a triptan before.  She is not using any NSAIDs.  Otherwise generally feeling okay.   She historically declined the pneumonia vaccine and COVID-vaccine.  She has gotten flu shots. Last colonoscopy as below.  She does have a history of iron  deficiency and vitamin D  deficiency, taking both of these supplements currently.     Prior endoscopic evaluation: Colonoscopy 08/14/2018 - normal ileum, normal anastomosis in the ascending colon, no inflammation Colonoscopy 2/16 - patent anastomosis with some inflammatory changes, ileum with some ulcerations EGD 2/16 - normal   Fecal calprotectin 07/21/2020 - normal (43)   Iron  deficient 07/21/2020   Entyvio  level 3.9, no antibodies on 01/24/22   Colonoscopy 02/02/22: Follow-up of Crohn's disease - history of prior ileal / right colon resection, on Entyvio  with some abdominal cramping / loose stools and history Uveitis recently. Entyvio  level recently returned subtherapeutic   The perianal and digital rectal examinations were normal. - The mid ileum contained a single (solitary) ulcer, near what appeared to be  a prior surgical anastomosis in the small bowel - The remainder of the exam in the terminal ileum was normal. The ileum was deeply intubated to eventually see the ulcer noted proximally. No other inflammatory changes noted. - There was evidence of a prior end-to-end ileo-colonic anastomosis in the ascending colon. This was patent and was characterized by healthy appearing mucosa. - A single small-mouthed  diverticulum was found in the ascending colon. - Anal papilla(e) were hypertrophied. - The exam was otherwise without abnormality.    12/29/2022 enterography with no evidence of bowel obstruction or stricture, no evidence of acute small or large bowel inflammation.     Assessment: 1.  Crohn's disease: Unfortunately has been off of Entyvio  over the past 6 months, first infusion back was 4 days ago, has been battling some constipation with rectal bleeding and generalized abdominal discomfort 2.  Migraines: Dr. Kimmora Risenhoover previously prescribed Imitrex  prior to her establishing care with PCP   Plan: 1.  Labs today to include CBC, CMP, vitamin D , iron  2.  Continue Entyvio  on a every 4 week basis 3.  Recommend the patient start over-the-counter MiraLAX up to 4 times daily to help with constipation 4.  Continue Dicyclomine  as needed 5.  Patient will call and let us  know if the Entyvio  is not working for her over the next couple of weeks, may need to consider short steroid taper 6.  Refill Imitrex  for 1 month.  Told her she needs to establish care with a PCP and I will not refill any further. 7.  Patient to follow in clinic with Dr. General Kenner in 3 to 4 months.    Thanks [employer]. Can you clarify how long she has been off Entyvio ? Nasopharyngitis can be a side effect of Entyvio , not sure if what she is dealing with his drug related or not and if it is something that may prohibit use of Entyvio  in the future. She needs an office visit with me to clarify next steps, not sure if I have anything open in the next 2 weeks. Can use a banding spot if any of those are held.  In the interim if her Crohn's is flaring could use budesonide  9mg  / day for 30 days, if needed. Thanks          Past Medical History:  Diagnosis Date   Anemia    Crohn's disease (HCC)    Osteoporosis    Supervision of normal first pregnancy 05/03/2013   Uveitis    Vitamin D  deficiency      Past Surgical History:   Procedure Laterality Date   COLONOSCOPY N/A 01/22/2015   Procedure: COLONOSCOPY;  Surgeon: Pietro Bridegroom, MD;  Location: [facility];  Service: Endoscopy;  Laterality: N/A;   DILATION AND EVACUATION N/A 10/31/2014   Procedure: DILATATION AND EVACUATION;  Surgeon: Abdul Hodgkin, MD;  Location: [facility];  Service: Gynecology;  Laterality: N/A;   ESOPHAGOGASTRODUODENOSCOPY N/A 01/22/2015   Procedure: ESOPHAGOGASTRODUODENOSCOPY (EGD);  Surgeon: Pietro Bridegroom, MD;  Location: [facility];  Service: Endoscopy;  Laterality: N/A;   FLEXIBLE SIGMOIDOSCOPY N/A 11/04/2014   Procedure: FLEXIBLE SIGMOIDOSCOPY;  Surgeon: Pietro Bridegroom, MD;  Location: [facility];  Service: Endoscopy;  Laterality: N/A;   ILEOSTOMY CLOSURE N/A 12/18/2014   Procedure: LOOP ILEOSTOMY REVERSAL;  Surgeon: Joyce Nixon, MD;  Location: [facility];  Service: General;  Laterality: N/A;   INCISION AND DRAINAGE Left    arm   LAPAROSCOPIC TUBAL LIGATION Bilateral 02/19/2020   Procedure: LAPAROSCOPIC TUBAL LIGATION;  Surgeon:  Tresia Fruit, MD;  Location: [facility];  Service: Gynecology;  Laterality: Bilateral;   LAPAROTOMY N/A 04/26/2024   Procedure: LAPAROTOMY, EXPLORATORY, LYSIS OF ADHESIONS;  Surgeon: Kinsinger, Alphonso Aschoff, MD;  Location: [facility];  Service: General;  Laterality: N/A;   SMALL INTESTINE SURGERY     WISDOM TOOTH EXTRACTION     Family History  Problem Relation Age of Onset   Diabetes Father    Diabetes Maternal Grandmother    Heart disease Maternal Grandmother    Hypertension Maternal Grandmother    Kidney disease Maternal Grandmother    Lung cancer Maternal Grandfather    Hyperlipidemia Maternal Grandfather    Stroke Neg Hx    Colon cancer Neg Hx    Colon polyps Neg Hx    Esophageal cancer Neg Hx    Rectal cancer Neg Hx    Stomach cancer Neg Hx    Social History   Tobacco Use   Smoking status: Never   Smokeless tobacco: Never   Tobacco comments:    occasional cigarette  Vaping Use    Vaping status: Never Used  Substance Use Topics   Alcohol use: No    Alcohol/week: 0.0 standard drinks of alcohol   Drug use: No   No current facility-administered medications for this visit.   No current outpatient medications on file.   Facility-Administered Medications Ordered in Other Visits  Medication Dose Route Frequency Provider Last Rate Last Admin   acetaminophen  (TYLENOL ) tablet 1,000 mg  1,000 mg Oral QID Charlott Converse, PA-C   1,000 mg at 05/19/Jessica 0845   alum & mag hydroxide-simeth (MAALOX/MYLANTA) 200-200-20 MG/5ML suspension 30 mL  30 mL Oral Q6H PRN Candyce Champagne, MD       bisacodyl  (DULCOLAX) suppository 10 mg  10 mg Rectal Daily Candyce Champagne, MD   10 mg at 05/19/Jessica 0846   dicyclomine  (BENTYL ) capsule 10 mg  10 mg Oral Q8H PRN Candyce Champagne, MD       enoxaparin  (LOVENOX ) injection 40 mg  40 mg Subcutaneous Q24H Enid Harry, MD   40 mg at 05/19/Jessica 0845   feeding supplement (KATE FARMS STANDARD 1.4) liquid 325 mL  325 mL Oral BID BM Oral Billings, MD   325 mL at 05/19/Jessica 7846   HYDROmorphone  (DILAUDID ) injection 0.5-1 mg  0.5-1 mg Intravenous Q4H PRN Candyce Champagne, MD   1 mg at 05/19/Jessica 1119   magic mouthwash  15 mL Oral QID PRN Candyce Champagne, MD       menthol -cetylpyridinium (CEPACOL) lozenge 3 mg  1 lozenge Oral PRN Kinsinger, Alphonso Aschoff, MD   3 mg at 05/19/Jessica 0847   methocarbamol  (ROBAXIN ) tablet 1,000 mg  1,000 mg Oral QID Maczis, Michael M, PA-C       multivitamin with minerals tablet 1 tablet  1 tablet Oral Daily Oral Billings, MD   1 tablet at 05/19/Jessica 9629   naphazoline-glycerin  (CLEAR EYES REDNESS) ophth solution 1-2 drop  1-2 drop Both Eyes QID PRN Candyce Champagne, MD       nystatin  (MYCOSTATIN ) 100000 UNIT/ML suspension 500,000 Units  5 mL Oral QID Oral Billings, MD   500,000 Units at 05/19/Jessica 0845   ondansetron  (ZOFRAN ) injection 4 mg  4 mg Intravenous Q6H PRN Patel, Vishal R, MD   4 mg at 05/16/Jessica 5284   Oral care mouth rinse  15 mL Mouth Rinse 4  times per day Gherghe, Costin M, MD   15 mL at 05/19/Jessica 1116   Oral care  mouth rinse  15 mL Mouth Rinse PRN Oral Billings, MD       oxyCODONE  (Oxy IR/ROXICODONE ) immediate release tablet 5-10 mg  5-10 mg Oral Q4H PRN Simaan, Elizabeth S, PA-C   10 mg at 05/19/Jessica 0844   phenol (CHLORASEPTIC) mouth spray 2 spray  2 spray Mouth/Throat PRN Candyce Champagne, MD       polycarbophil (FIBERCON) tablet 1,250 mg  1,250 mg Oral BID Candyce Champagne, MD   1,250 mg at 05/19/Jessica 0844   simethicone  (MYLICON) chewable tablet 80 mg  80 mg Oral Q6H PRN Kinsinger, Alphonso Aschoff, MD   80 mg at 05/16/Jessica 0553   sodium chloride  (OCEAN) 0.65 % nasal spray 1-2 spray  1-2 spray Each Nare Q6H PRN Candyce Champagne, MD       No Known Allergies   Review of Systems: All systems reviewed and negative except where noted in HPI.    DG Abd Portable 1V Result Date: 05/03/2024 CLINICAL DATA:  [id number] Ileus (HCC) [id number] EXAM: PORTABLE ABDOMEN - 1 VIEW COMPARISON:  Apr 26, 2024 FINDINGS: Midline surgical staples. There are several persistent dilated loops of small bowel in the RIGHT hemiabdomen. Representative loop of bowel measures approximately 4 cm in diameter. Visualized lung bases are unremarkable. Tubal ligation clips. IMPRESSION: There are several persistent dilated loops of small bowel in the RIGHT hemiabdomen. Findings are favored to reflect ileus in a postoperative setting. If concern for obstruction, consider serial abdominal radiographs after ingestion of enteric contrast versus dedicated cross-sectional imaging. Electronically Signed   By: Clancy Crimes M.D.   On: 05/03/2024 16:43   CT ABDOMEN PELVIS W CONTRAST Result Date: 04/26/2024 CLINICAL DATA:  Abdominal pain. EXAM: CT ABDOMEN AND PELVIS WITH CONTRAST TECHNIQUE: Multidetector CT imaging of the abdomen and pelvis was performed using the standard protocol following bolus administration of intravenous contrast. RADIATION DOSE REDUCTION: This exam was performed according to the  departmental dose-optimization program which includes automated exposure control, adjustment of the mA and/or kV according to patient size and/or use of iterative reconstruction technique. CONTRAST:  75mL OMNIPAQUE  IOHEXOL  350 MG/ML SOLN COMPARISON:  January 03, 2015 FINDINGS: Lower chest: No acute abnormality. Hepatobiliary: There is diffuse fatty infiltration of the liver parenchyma. No focal liver abnormality is seen. Small folds are seen within the gallbladder fundus. No gallstones, gallbladder wall thickening, or biliary dilatation. Pancreas: Unremarkable. No pancreatic ductal dilatation or surrounding inflammatory changes. Spleen: Normal in size without focal abnormality. Adrenals/Urinary Tract: Adrenal glands are unremarkable. Kidneys are normal in size, without renal calculi or hydronephrosis. A small, stable simple left renal cyst is seen. Bladder is unremarkable. Stomach/Bowel: There is a small hiatal hernia. Surgically anastomosed bowel is seen within the mid to upper right abdomen and along the expected region of the cecum and proximal ascending colon. Multiple dilated small bowel loops are also seen throughout the abdomen and pelvis (maximum small bowel diameter of approximately 3.2 cm). A transition zone is seen within the mid right abdomen at an area of surgically anastomosed large and small bowel. Vascular/Lymphatic: No significant vascular findings are present. No enlarged abdominal or pelvic lymph nodes. Reproductive: Bilateral tubal ligation clips are seen. The uterus and bilateral adnexa are otherwise unremarkable. Other: No abdominal wall hernia or abnormality. There is a small amount of pelvic free fluid. Musculoskeletal: No acute or significant osseous findings. IMPRESSION: 1. Findings consistent with a partial small bowel obstruction with a transition zone seen within the mid right abdomen at an area of surgically anastomosed large and small  bowel. 2. Small hiatal hernia. 3. Hepatic  steatosis. 4. Small amount of pelvic free fluid, likely reactive in nature. Electronically Signed   By: Virgle Grime M.D.   On: 04/26/2024 20:47    Physical Exam: LMP 04/19/2024 (Exact Date)  Constitutional: Pleasant,well-developed, ***Jessica [name] in no acute distress. HEENT: Normocephalic and atraumatic. Conjunctivae are normal. No scleral icterus. Neck supple.  Cardiovascular: Normal rate, regular rhythm.  Pulmonary/chest: Effort normal and breath sounds normal. No wheezing, rales or rhonchi. Abdominal: Soft, nondistended, nontender. Bowel sounds active throughout. There are no masses palpable. No hepatomegaly. Extremities: no edema Lymphadenopathy: No cervical adenopathy noted. Neurological: Alert and oriented to person place and time. Skin: Skin is warm and dry. No rashes noted. Psychiatric: Normal mood and affect. Behavior is normal.   ASSESSMENT: 28 y.o. Jessica [name] here for assessment of the following  No diagnosis found.  PLAN:   No ref. provider found

## 2024-05-06 NOTE — Plan of Care (Signed)
  Problem: Clinical Measurements: Goal: Ability to maintain clinical measurements within normal limits will improve Outcome: Progressing Goal: Will remain free from infection Outcome: Completed/Met Goal: Diagnostic test results will improve Outcome: Completed/Met Goal: Respiratory complications will improve Outcome: Completed/Met Goal: Cardiovascular complication will be avoided Outcome: Completed/Met

## 2024-05-06 NOTE — Plan of Care (Signed)
   Problem: Nutrition: Goal: Adequate nutrition will be maintained Outcome: Progressing

## 2024-05-06 NOTE — TOC Progression Note (Signed)
 Transition of Care [facility]) - Progression Note    Patient Details  Name: Jessica [name] MRN: [id number] Date of Birth: 1996-01-25  Transition of Care [facility]) CM/SW Contact  Tom-Johnson, Chiante Peden Daphne, RN Phone Number: 05/06/2024, 5:16 PM  Clinical Narrative:     Patient's Ileus improving, advancing diet as tolerated. Passing Flatus. Tolerating soft diet but not eating much as she gets full quickly. Gen sx following.   Patient not Medically ready for discharge.  CM will continue to follow as patient progresses with care towards discharge.          Expected Discharge Plan and Services                                               Social Determinants of Health (SDOH) Interventions SDOH Screenings   Food Insecurity: No Food Insecurity (04/28/2024)  Housing: Low Risk  (04/28/2024)  Transportation Needs: No Transportation Needs (04/28/2024)  Utilities: Not At Risk (04/28/2024)  Tobacco Use: Low Risk  (04/26/2024)    Readmission Risk Interventions    04/29/2024    4:38 PM  Readmission Risk Prevention Plan  Post Dischage Appt Complete  Medication Screening Complete  Transportation Screening Complete

## 2024-05-06 NOTE — Progress Notes (Signed)
 10 Days Post-Op  Subjective: CC: Tolerating soft diet but not eating much as she gets full quickly. Had 1/2 shake yesterday. Does have some lower abdominal pain at night and after po intake that is well controlled. Overall feels her pain/ttp is less today. Passing flatus. 2 BM's yesterday, one after suppository. Another BM today after suppository. Voiding. Mobilizing.   Afebrile. HR 104. No hypotension. WBC 7.4.  Objective: Vital signs in last 24 hours: Temp:  [98.6 F (37 C)-98.9 F (37.2 C)] 98.6 F (37 C) (05/19 0722) Pulse Rate:  [95-104] 104 (05/19 0516) Resp:  [14-16] 16 (05/19 0722) BP: (127-136)/(87-108) 136/91 (05/19 0722) SpO2:  [100 %] 100 % (05/19 0722) Last BM Date : 05/05/24  Intake/Output from previous day: 05/18 0701 - 05/19 0700 In: 360 [P.O.:360] Out: -  Intake/Output this shift: No intake/output data recorded.  PE: Gen:  Alert, NAD, pleasant Abd: Soft, mild distension, lower ttp that she reports is improved from yesterday. +BS. Midline wound with staples in place cdi.   Lab Results:  Recent Labs    05/05/24 0807 05/06/24 0549  WBC 8.1 7.4  HGB 12.3 12.5  HCT 36.8 38.3  PLT 328 322   BMET Recent Labs    05/05/24 0807 05/06/24 0549  NA 132* 132*  K 4.2 3.5  CL 98 98  CO2 20* 25  GLUCOSE 93 105*  BUN <5* <5*  CREATININE 0.55 0.56  CALCIUM  8.9 9.1   PT/INR No results for input(s): "LABPROT", "INR" in the last 72 hours. CMP     Component Value Date/Time   NA 132 (L) 05/06/2024 0549   NA 138 10/16/2019 1420   K 3.5 05/06/2024 0549   CL 98 05/06/2024 0549   CO2 25 05/06/2024 0549   GLUCOSE 105 (H) 05/06/2024 0549   BUN <5 (L) 05/06/2024 0549   BUN 6 10/16/2019 1420   CREATININE 0.56 05/06/2024 0549   CALCIUM  9.1 05/06/2024 0549   PROT 8.3 (H) 05/06/2024 0549   PROT 6.8 10/16/2019 1420   ALBUMIN 3.2 (L) 05/06/2024 0549   ALBUMIN 3.7 (L) 10/16/2019 1420   AST 38 05/06/2024 0549   ALT 48 (H) 05/06/2024 0549   ALKPHOS 112  05/06/2024 0549   BILITOT 0.7 05/06/2024 0549   BILITOT 0.3 10/16/2019 1420   GFRNONAA >60 05/06/2024 0549   GFRAA >60 10/19/2019 1655   Lipase     Component Value Date/Time   LIPASE 62 (H) 04/26/2024 1716    Studies/Results: No results found.  Anti-infectives: Anti-infectives (From admission, onward)    Start     Dose/Rate Route Frequency Ordered Stop   04/27/24 0600  cefoTEtan  (CEFOTAN ) 2 g in sodium chloride  0.9 % 100 mL IVPB        2 g 200 mL/hr over 30 Minutes Intravenous On call to O.R. 04/26/24 2214 04/26/24 2336   04/26/24 2234  sodium chloride  0.9 % with cefoTEtan  (CEFOTAN ) ADS Med       Note to Pharmacy: [name] T: cabinet override      04/26/24 2234 04/26/24 2316        Assessment/Plan POD 10 s/p open LOA by Dr. 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     LOS: 10 days    Delton Filbert, [facility] 05/06/2024, 10:46 AM Please see Amion for pager number during day hours 7:00am-4:30pm

## 2024-05-06 NOTE — Plan of Care (Signed)
  Problem: Clinical Measurements: Goal: Ability to maintain clinical measurements within normal limits will improve Outcome: Progressing Goal: Will remain free from infection Outcome: Progressing Goal: Diagnostic test results will improve Outcome: Progressing Goal: Respiratory complications will improve Outcome: Progressing Goal: Cardiovascular complication will be avoided Outcome: Progressing   Problem: Activity: Goal: Risk for activity intolerance will decrease Outcome: Progressing   Problem: Nutrition: Goal: Adequate nutrition will be maintained Outcome: Progressing   Problem: Coping: Goal: Level of anxiety will decrease Outcome: Progressing   Problem: Elimination: Goal: Will not experience complications related to bowel motility Outcome: Progressing Goal: Will not experience complications related to urinary retention Outcome: Progressing   Problem: Pain Managment: Goal: General experience of comfort will improve and/or be controlled Outcome: Progressing   Problem: Safety: Goal: Ability to remain free from injury will improve Outcome: Progressing   Problem: Skin Integrity: Goal: Risk for impaired skin integrity will decrease Outcome: Progressing   Problem: Education: Goal: Required Educational Video(s) Outcome: Progressing   Problem: Clinical Measurements: Goal: Ability to maintain clinical measurements within normal limits will improve Outcome: Progressing Goal: Postoperative complications will be avoided or minimized Outcome: Progressing   Problem: Skin Integrity: Goal: Demonstration of wound healing without infection will improve Outcome: Progressing

## 2024-05-06 NOTE — Progress Notes (Signed)
  Progress Note   Patient: Jessica [name] ZOX:[id number] DOB: 08-01-1996 DOA: 04/26/2024     10 DOS: the patient was seen and examined on 05/06/2024   Brief hospital course: Jessica [name] is a 28 y.o. 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  Assessment and Plan: Principal problem Crohn's ileocolitis with small bowel obstruction-status post ex lap on 5/9 by Dr. Zadie Herter, underwent LOA - Discussed with gastroenterology, scan reviewed, no evidence of active disease.  She is on Entyvio  as an outpatient and she got it about a week ago - Noted to have evidence of ileus on 5/16 xray, now improving. Advancing diet per General Surgery. Possible d/c in AM   Active problems Leukocytosis-perioperatively.  Monitor.  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  She would benefit from weight loss   Subjective: Ambulating in the hallway  Physical Exam: Vitals:   05/05/24 2044 05/06/24 0516 05/06/24 0722 05/06/24 1508  BP: (!) 132/108 (!) 127/101 (!) 136/91 111/86  Pulse: (!) 103 (!) 104  (!) 107  Resp: 16 14 16 18   Temp: 98.9 F (37.2 C) 98.6 F (37 C) 98.6 F (37 C) 98 F (36.7 C)  TempSrc: Oral Oral Oral Oral  SpO2: 100% 100% 100% 100%  Weight:      Height:       General exam: Awake, laying in bed, in nad Respiratory system: Normal respiratory effort, no wheezing Cardiovascular system: regular rate, s1, s2 Gastrointestinal system: Soft, nondistended, positive BS Central nervous system: CN2-12 grossly intact, strength intact Extremities: Perfused, no clubbing Skin: Normal skin turgor, no notable skin lesions seen Psychiatry: Mood normal // no visual hallucinations   Data Reviewed:  Labs reviewed: Na 132, K 3.5, Cr 0.56, WBC 7.4, Hgb 12.5   Family Communication: Pt in room, family at bedside  Disposition: Status is:  Inpatient Remains inpatient appropriate because: severity of illness  Planned Discharge Destination: Home    Author: Cherylle Corwin, MD 05/06/2024 5:50 PM  For on call review [email or web address].

## 2024-05-07 DIAGNOSIS — K50812 Crohn's disease of both small and large intestine with intestinal obstruction: Secondary | ICD-10-CM | POA: Diagnosis not present

## 2024-05-07 DIAGNOSIS — K50912 Crohn's disease, unspecified, with intestinal obstruction: Secondary | ICD-10-CM | POA: Diagnosis not present

## 2024-05-07 DIAGNOSIS — K56609 Unspecified intestinal obstruction, unspecified as to partial versus complete obstruction: Secondary | ICD-10-CM | POA: Diagnosis not present

## 2024-05-07 LAB — COMPREHENSIVE METABOLIC PANEL WITH GFR
ALT: 39 U/L (ref 0–44)
AST: 28 U/L (ref 15–41)
Albumin: 3 g/dL — ABNORMAL LOW (ref 3.5–5.0)
Alkaline Phosphatase: 120 U/L (ref 38–126)
Anion gap: 7 (ref 5–15)
BUN: <5 mg/dL — ABNORMAL LOW (ref 6–20)
CO2: 25 mmol/L (ref 22–32)
Calcium: 9 mg/dL (ref 8.9–10.3)
Chloride: 99 mmol/L (ref 98–111)
Creatinine, Ser: 0.6 mg/dL (ref 0.44–1.00)
GFR, Estimated: >60 mL/min (ref >60)
Glucose, Bld: 109 mg/dL — ABNORMAL HIGH (ref 70–99)
Potassium: 3.8 mmol/L (ref 3.5–5.1)
Sodium: 131 mmol/L — ABNORMAL LOW (ref 135–145)
Total Bilirubin: 0.8 mg/dL (ref 0.0–1.2)
Total Protein: 7.9 g/dL (ref 6.5–8.1)

## 2024-05-07 LAB — CBC
HCT: 37.2 % (ref 36.0–46.0)
Hemoglobin: 11.9 g/dL — ABNORMAL LOW (ref 12.0–15.0)
MCH: 27.7 pg (ref 26.0–34.0)
MCHC: 32 g/dL (ref 30.0–36.0)
MCV: 86.7 fL (ref 80.0–100.0)
Platelets: 305 10*3/uL (ref 150–400)
RBC: 4.29 MIL/uL (ref 3.87–5.11)
RDW: 13.1 % (ref 11.5–15.5)
WBC: 8.7 10*3/uL (ref 4.0–10.5)
nRBC: 0 % (ref 0.0–0.2)

## 2024-05-07 MED ORDER — PHENOL 1.4 % MT LIQD
1.0000 | OROMUCOSAL | Status: DC | PRN
  Administered 2024-05-08: 1 via OROMUCOSAL
  Filled 2024-05-07: qty 177

## 2024-05-07 NOTE — Plan of Care (Signed)
  Problem: Clinical Measurements: Goal: Ability to maintain clinical measurements within normal limits will improve Outcome: Progressing   Problem: Nutrition: Goal: Adequate nutrition will be maintained Outcome: Progressing   Problem: Coping: Goal: Level of anxiety will decrease Outcome: Progressing   Problem: Elimination: Goal: Will not experience complications related to bowel motility Outcome: Progressing Goal: Will not experience complications related to urinary retention Outcome: Progressing   Problem: Pain Managment: Goal: General experience of comfort will improve and/or be controlled Outcome: Progressing   Problem: Safety: Goal: Ability to remain free from injury will improve Outcome: Progressing   Problem: Skin Integrity: Goal: Risk for impaired skin integrity will decrease Outcome: Progressing   Problem: Education: Goal: Required Educational Video(s) Outcome: Progressing   Problem: Clinical Measurements: Goal: Ability to maintain clinical measurements within normal limits will improve Outcome: Progressing Goal: Postoperative complications will be avoided or minimized Outcome: Progressing   Problem: Skin Integrity: Goal: Demonstration of wound healing without infection will improve Outcome: Progressing

## 2024-05-07 NOTE — Progress Notes (Signed)
    11 Days Post-Op  Subjective: Had another BM this morning after a suppository. Feels well but is concerned about having constipation at home. She normal has 2 bowel movements per day.   Objective: Vital signs in last 24 hours: Temp:  [98 F (36.7 C)-98.5 F (36.9 C)] 98.3 F (36.8 C) (05/20 0749) Pulse Rate:  [100-115] 115 (05/20 0749) Resp:  [18] 18 (05/20 0749) BP: (111-137)/(85-106) 137/106 (05/20 0749) SpO2:  [96 %-100 %] 96 % (05/20 0749) Last BM Date : 05/06/24  Intake/Output from previous day: 05/19 0701 - 05/20 0700 In: 240 [P.O.:240] Out: 0  Intake/Output this shift: Total I/O In: 200 [P.O.:200] Out: 1 [Urine:1]  PE: Gen:  Alert, NAD, pleasant Abd: Soft, nondistended, midline incision is clean and dry with staples in place.  Lab Results:  Recent Labs    05/06/24 0549 05/07/24 0518  WBC 7.4 8.7  HGB 12.5 11.9*  HCT 38.3 37.2  PLT 322 305   BMET Recent Labs    05/06/24 0549 05/07/24 0518  NA 132* 131*  K 3.5 3.8  CL 98 99  CO2 25 25  GLUCOSE 105* 109*  BUN <5* <5*  CREATININE 0.56 0.60  CALCIUM  9.1 9.0   PT/INR No results for input(s): "LABPROT", "INR" in the last 72 hours. CMP     Component Value Date/Time   NA 131 (L) 05/07/2024 0518   NA 138 10/16/2019 1420   K 3.8 05/07/2024 0518   CL 99 05/07/2024 0518   CO2 25 05/07/2024 0518   GLUCOSE 109 (H) 05/07/2024 0518   BUN <5 (L) 05/07/2024 0518   BUN 6 10/16/2019 1420   CREATININE 0.60 05/07/2024 0518   CALCIUM  9.0 05/07/2024 0518   PROT 7.9 05/07/2024 0518   PROT 6.8 10/16/2019 1420   ALBUMIN 3.0 (L) 05/07/2024 0518   ALBUMIN 3.7 (L) 10/16/2019 1420   AST 28 05/07/2024 0518   ALT 39 05/07/2024 0518   ALKPHOS 120 05/07/2024 0518   BILITOT 0.8 05/07/2024 0518   BILITOT 0.3 10/16/2019 1420   GFRNONAA >60 05/07/2024 0518   GFRAA >60 10/19/2019 1655   Lipase     Component Value Date/Time   LIPASE 62 (H) 04/26/2024 1716       Assessment/Plan POD 11 s/p open LOA by Dr.  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 Will remove staples today.    LOS: 11 days    Lujean Sake, MD [facility] Surgery 05/07/2024, 10:44 AM Please see Amion for pager number during day hours 7:00am-4:30pm

## 2024-05-07 NOTE — Progress Notes (Signed)
  Progress Note   Patient: Jessica [name] ZOX:[id number] DOB: 04-Apr-1996 DOA: 04/26/2024     11 DOS: the patient was seen and examined on 05/07/2024   Brief hospital course: MUREL WIGLE is a 28 y.o. 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  Assessment and Plan: Principal problem Crohn's ileocolitis with small bowel obstruction-status post ex lap on 5/9 by Dr. Zadie Herter, underwent LOA - Discussed with gastroenterology, scan reviewed, no evidence of active disease.  She is on Entyvio  as an outpatient and she got it about a week ago - Noted to have evidence of ileus on 5/16 xray, now improving. Advancing diet per General Surgery. Clear for d/c today per General Surgery, however pt is apprehensive, stating "I don't feel so good"   Active problems Leukocytosis-perioperatively.  Monitor.  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  She would benefit from weight loss  Sore throat - cont with chloraseptic spray for symptom mgt. Will check strep test   Subjective: Stating "I don't feel so good" while pointing to her abd.   Physical Exam: Vitals:   05/06/24 2048 05/07/24 0510 05/07/24 0749 05/07/24 1658  BP: (!) 136/99 121/85 (!) 137/106 (!) 134/101  Pulse: 100 (!) 103 (!) 115 89  Resp: 18 18 18 19   Temp: 98.1 F (36.7 C) 98.5 F (36.9 C) 98.3 F (36.8 C) 98.6 F (37 C)  TempSrc: Oral Oral    SpO2: 100% 97% 96% 98%  Weight:      Height:       General exam: Conversant, in no acute distress Respiratory system: normal chest rise, clear, no audible wheezing Cardiovascular system: regular rhythm, s1-s2 Gastrointestinal system: Nondistended, nontender, pos BS Central nervous system: No seizures, no tremors Extremities: No cyanosis, no joint deformities Skin: No rashes, no pallor Psychiatry: Affect normal // no auditory  hallucinations   Data Reviewed:  Labs reviewed: Na 131, K 3.8, Cr 0.60, WBC 8.7, hgb 11.9  Family Communication: Pt in room, family at bedside  Disposition: Status is: Inpatient Remains inpatient appropriate because: severity of illness  Planned Discharge Destination: Home    Author: Cherylle Corwin, MD 05/07/2024 5:59 PM  For on call review [email or web address].

## 2024-05-07 NOTE — Discharge Instructions (Addendum)
   [facility] DISCHARGE INSTRUCTIONS  Activity No heavy lifting greater than 15 pounds for 8 weeks after surgery. Ok to shower, but do not bathe or submerge incisions underwater. Do not drive while taking narcotic pain medication. You may drive when you are no longer taking prescription pain medication, you can comfortably wear a seatbelt, and you can safely maneuver your car and apply brakes.  Wound Care Your incisions are covered with skin glue called Dermabond. This will peel off on its own over time. You may shower and allow warm soapy water to run over your incisions. Gently pat dry. Do not submerge your incision underwater until cleared by your surgeon. Monitor your incision for any new redness, tenderness, or drainage. Many patients will experience some swelling and bruising at the incisions.  Ice packs will help.  Swelling and bruising can take several days to resolve.   Medications A  prescription for pain medication may be given to you upon discharge.  Take your pain medication as prescribed, if needed.  If narcotic pain medicine is not needed, then you may take acetaminophen  (Tylenol ) or ibuprofen  (Advil ) as needed. It is common to experience some constipation if taking pain medication after surgery.  Increasing fluid intake and taking a stool softener (such as Colace) will usually help or prevent this problem from occurring.  A mild laxative (Milk of Magnesia or Miralax) should be taken according to package directions if there are no bowel movements after 48 hours. Take your usually prescribed medications unless otherwise directed. If you need a refill on your pain medication, please contact your pharmacy.  They will contact our office to request authorization. Prescriptions will not be filled after 5 pm or on weekends.  When to Call Us : Fever greater than 100.5 New redness, drainage, or swelling at incision site Severe pain, nausea, or vomiting Persistent bleeding  from incisions   Follow-up You have a follow up appointment scheduled with Dr. Dorrie Gaudier on June 04, 2024 at 1:50pm. This will be at the [facility] Surgery office at [address], [address], [place], [place]. Please arrive about 30 minutes prior to your scheduled appointment time to complete paperwork. If you need to reschedule your appointment, please call the office at the number below.  IF YOU HAVE DISABILITY OR FAMILY LEAVE FORMS, YOU MUST BRING THEM TO THE OFFICE FOR PROCESSING.   DO NOT GIVE THEM TO YOUR DOCTOR.  The clinic staff is available to answer your questions during regular business hours.  Please don't hesitate to call and ask to speak to one of the nurses for clinical concerns.  If you have a medical emergency, go to the nearest emergency room or call 911.  A surgeon from [facility] is always on call at the hospital  [address], [address], [place], [place]  [address] ?  [address] [address], [place], [place]   [address] [phone] ? Toll Free: [phone] ? FAX [phone] Web site: [email or web address]

## 2024-05-07 NOTE — Progress Notes (Signed)
 19 staples removed per MD order, incision well approximated, no clinical signs and symptoms of infection noted, steri strips applied  [employer]

## 2024-05-08 DIAGNOSIS — K50812 Crohn's disease of both small and large intestine with intestinal obstruction: Secondary | ICD-10-CM | POA: Diagnosis not present

## 2024-05-08 LAB — COMPREHENSIVE METABOLIC PANEL WITH GFR
ALT: 32 U/L (ref 0–44)
AST: 22 U/L (ref 15–41)
Albumin: 3.2 g/dL — ABNORMAL LOW (ref 3.5–5.0)
Alkaline Phosphatase: 110 U/L (ref 38–126)
Anion gap: 12 (ref 5–15)
BUN: <5 mg/dL — ABNORMAL LOW (ref 6–20)
CO2: 24 mmol/L (ref 22–32)
Calcium: 9.3 mg/dL (ref 8.9–10.3)
Chloride: 98 mmol/L (ref 98–111)
Creatinine, Ser: 0.61 mg/dL (ref 0.44–1.00)
GFR, Estimated: >60 mL/min (ref >60)
Glucose, Bld: 101 mg/dL — ABNORMAL HIGH (ref 70–99)
Potassium: 3.7 mmol/L (ref 3.5–5.1)
Sodium: 134 mmol/L — ABNORMAL LOW (ref 135–145)
Total Bilirubin: 0.8 mg/dL (ref 0.0–1.2)
Total Protein: 8.3 g/dL — ABNORMAL HIGH (ref 6.5–8.1)

## 2024-05-08 LAB — CBC
HCT: 39.1 % (ref 36.0–46.0)
Hemoglobin: 12.9 g/dL (ref 12.0–15.0)
MCH: 28.3 pg (ref 26.0–34.0)
MCHC: 33 g/dL (ref 30.0–36.0)
MCV: 85.7 fL (ref 80.0–100.0)
Platelets: 328 10*3/uL (ref 150–400)
RBC: 4.56 MIL/uL (ref 3.87–5.11)
RDW: 13.2 % (ref 11.5–15.5)
WBC: 7.9 10*3/uL (ref 4.0–10.5)
nRBC: 0 % (ref 0.0–0.2)

## 2024-05-08 MED ORDER — ACETAMINOPHEN 500 MG PO TABS: 1000.0000 mg | ORAL_TABLET | Freq: Four times a day (QID) | ORAL | Status: DC | PRN

## 2024-05-08 MED ORDER — METHOCARBAMOL 1000 MG PO TABS: 1000.0000 mg | ORAL_TABLET | Freq: Four times a day (QID) | ORAL | 0 refills | Status: DC | PRN

## 2024-05-08 MED ORDER — OXYCODONE HCL 10 MG PO TABS: 10.0000 mg | ORAL_TABLET | Freq: Four times a day (QID) | ORAL | 0 refills | Status: DC | PRN

## 2024-05-08 MED ORDER — AMOXICILLIN-POT CLAVULANATE 875-125 MG PO TABS
1.0000 | ORAL_TABLET | Freq: Two times a day (BID) | ORAL | Status: DC
  Administered 2024-05-08 – 2024-05-09 (×3): 1 via ORAL
  Filled 2024-05-08 (×3): qty 1

## 2024-05-08 NOTE — Plan of Care (Signed)
  Problem: Clinical Measurements: Goal: Ability to maintain clinical measurements within normal limits will improve Outcome: Adequate for Discharge   

## 2024-05-08 NOTE — Plan of Care (Signed)
  Problem: Clinical Measurements: Goal: Ability to maintain clinical measurements within normal limits will improve Outcome: Progressing   Problem: Nutrition: Goal: Adequate nutrition will be maintained Outcome: Progressing   Problem: Elimination: Goal: Will not experience complications related to bowel motility Outcome: Progressing Goal: Will not experience complications related to urinary retention Outcome: Progressing   Problem: Pain Managment: Goal: General experience of comfort will improve and/or be controlled Outcome: Progressing   Problem: Safety: Goal: Ability to remain free from injury will improve Outcome: Progressing   Problem: Skin Integrity: Goal: Risk for impaired skin integrity will decrease Outcome: Progressing   Problem: Education: Goal: Required Educational Video(s) Outcome: Progressing   Problem: Clinical Measurements: Goal: Ability to maintain clinical measurements within normal limits will improve Outcome: Progressing Goal: Postoperative complications will be avoided or minimized Outcome: Progressing   Problem: Skin Integrity: Goal: Demonstration of wound healing without infection will improve Outcome: Progressing

## 2024-05-08 NOTE — Progress Notes (Addendum)
    12 Days Post-Op  Subjective: Abdominal pain improving. Tolerating PO but intake limited by throat pain. Reports regular flatus and BMs. Mobilizing. Hoping to go home.  Objective: Vital signs in last 24 hours: Temp:  [97.9 F (36.6 C)-98.6 F (37 C)] 98.3 F (36.8 C) (05/21 0750) Pulse Rate:  [76-122] 122 (05/21 0750) Resp:  [18-19] 19 (05/21 0750) BP: (121-175)/(91-111) 134/111 (05/21 0750) SpO2:  [96 %-100 %] 100 % (05/21 0750) Last BM Date : 05/07/24  Intake/Output from previous day: 05/20 0701 - 05/21 0700 In: 840 [P.O.:840] Out: 1 [Urine:1] Intake/Output this shift: Total I/O In: 200 [P.O.:200] Out: 0   PE: Gen:  Alert, NAD, pleasant ENT: tongue with white exudate, oropharynx and tonsils are erythematous and swollen Abd: Soft, nondistended, midline incision is clean and dry with interval removal of staples   Lab Results:  Recent Labs    05/07/24 0518 05/08/24 0452  WBC 8.7 7.9  HGB 11.9* 12.9  HCT 37.2 39.1  PLT 305 328   BMET Recent Labs    05/07/24 0518 05/08/24 0452  NA 131* 134*  K 3.8 3.7  CL 99 98  CO2 25 24  GLUCOSE 109* 101*  BUN <5* <5*  CREATININE 0.60 0.61  CALCIUM  9.0 9.3   PT/INR No results for input(s): "LABPROT", "INR" in the last 72 hours. CMP     Component Value Date/Time   NA 134 (L) 05/08/2024 0452   NA 138 10/16/2019 1420   K 3.7 05/08/2024 0452   CL 98 05/08/2024 0452   CO2 24 05/08/2024 0452   GLUCOSE 101 (H) 05/08/2024 0452   BUN <5 (L) 05/08/2024 0452   BUN 6 10/16/2019 1420   CREATININE 0.61 05/08/2024 0452   CALCIUM  9.3 05/08/2024 0452   PROT 8.3 (H) 05/08/2024 0452   PROT 6.8 10/16/2019 1420   ALBUMIN 3.2 (L) 05/08/2024 0452   ALBUMIN 3.7 (L) 10/16/2019 1420   AST 22 05/08/2024 0452   ALT 32 05/08/2024 0452   ALKPHOS 110 05/08/2024 0452   BILITOT 0.8 05/08/2024 0452   BILITOT 0.3 10/16/2019 1420   GFRNONAA >60 05/08/2024 0452   GFRAA >60 10/19/2019 1655   Lipase     Component Value Date/Time    LIPASE 62 (H) 04/26/2024 1716       Assessment/Plan POD 12 s/p open LOA by Dr. 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 Staples removed 5/20. F/U with Dr. Dorrie Gaudier provided. Pain Rx provided.    LOS: 12 days    Charlott Converse, [facility] 05/08/2024, 10:39 AM Please see Amion for pager number during day hours 7:00am-4:30pm

## 2024-05-08 NOTE — Progress Notes (Signed)
  Progress Note   Patient: Jessica [name] VWU:[id number] DOB: 02-08-1996 DOA: 04/26/2024     12 DOS: the patient was seen and examined on 05/08/2024   Brief hospital course: Jessica [name] is a 28 y.o. 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  Assessment and Plan: Principal problem Crohn's ileocolitis with small bowel obstruction-status post ex lap on 5/9 by Dr. Zadie Herter, underwent LOA -  She is on Entyvio  as an outpatient and can resume it as an outpatient -Pain control oxycodone  5-10 every 4 as needed moderate pain, Dilaudid  0.5-1 Robaxin  1000 every 4 - Noted to have evidence of ileus on 5/16 xray, now improving. Advancing diet per General Surgery.    Probable Tonsillitis On exam neck is swollen with enlarged glands and erythema of back of throat with tonsillar swelling.  She has prior been treated for tonsillitis We can await Strep throat test, but will start Augmentin  875 at this time I have stopped her nystatin  and she can continue Chloraseptic  Leukocytosis-perioperatively.  Monitor.  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  She would benefit from weight loss    Subjective: main concern is throat discomfort and swelling--- this is preventing her from eating and she is unable to take much p.o.   Physical Exam: Vitals:   05/07/24 2049 05/08/24 0534 05/08/24 0713 05/08/24 0750  BP: (!) 121/91 (!) 136/105 (!) 175/96 (!) 134/111  Pulse: 94 (!) 104 76 (!) 122  Resp: 18 18 18 19   Temp: 97.9 F (36.6 C) 98.5 F (36.9 C) 98 F (36.7 C) 98.3 F (36.8 C)  TempSrc:  Oral Oral   SpO2: 96% 97% 98% 100%  Weight:      Height:       Coherent no distress looks well feels fair no fever chills nausea S1-S2 no murmur Postop changes statin Swollen bilaterally with enlarged submandibular glands Back throat is red and  erythematous No lower extremity edema  Data Reviewed:  Sodium 134, potassium 3.7, BUN/creatinine 5/0.6  LFTs normal, WBC 7, hemoglobin 12  Family Communication: Pt in room Disposition: Status is: Inpatient Remains inpatient appropriate because: severity of illness  Planned Discharge Destination: Home    Author: [name] Elber Galyean, MD 05/08/2024 10:52 AM  For on call review [email or web address].

## 2024-05-08 NOTE — Plan of Care (Signed)
   Problem: Elimination: Goal: Will not experience complications related to urinary retention Outcome: Completed/Met

## 2024-05-08 NOTE — TOC Progression Note (Signed)
 Transition of Care [facility]) - Progression Note    Patient Details  Name: Jessica [name] MRN: [id number] Date of Birth: 16-Jul-1996  [facility] CM/SW Contact  Tom-Johnson, Jenisa Monty Daphne, RN Phone Number: 05/08/2024, 1:52 PM  Clinical Narrative:     Patient is POD 12 Ex-Lap. Gen Sx following. Patient noted to have a swollen Neck with enlarged Glands, Tonsillar swelling and redness to back of Throat. Patient started on Oral Augmentin .  Awaiting Strep Test result.   Patient not Medically ready for discharge.  CM will continue to follow as patient progresses with care towards discharge.       Expected Discharge Plan and Services                                               Social Determinants of Health (SDOH) Interventions SDOH Screenings   Food Insecurity: No Food Insecurity (04/28/2024)  Housing: Low Risk  (04/28/2024)  Transportation Needs: No Transportation Needs (04/28/2024)  Utilities: Not At Risk (04/28/2024)  Tobacco Use: Low Risk  (04/26/2024)    Readmission Risk Interventions    04/29/2024    4:38 PM  Readmission Risk Prevention Plan  Post Dischage Appt Complete  Medication Screening Complete  Transportation Screening Complete

## 2024-05-08 NOTE — Progress Notes (Signed)
 Patient continues to complain of throat being sore. Kept her up most of the night. Pain meds given as needed. Throat spray at bedside.

## 2024-05-09 DIAGNOSIS — K50812 Crohn's disease of both small and large intestine with intestinal obstruction: Secondary | ICD-10-CM | POA: Diagnosis not present

## 2024-05-09 LAB — CULTURE, GROUP A STREP (THRC)

## 2024-05-09 MED ORDER — FLUCONAZOLE 150 MG PO TABS: ORAL_TABLET | ORAL | 0 refills | Status: DC

## 2024-05-09 MED ORDER — AMOXICILLIN-POT CLAVULANATE 875-125 MG PO TABS: 1.0000 | ORAL_TABLET | Freq: Two times a day (BID) | ORAL | 0 refills | Status: AC

## 2024-05-09 MED ORDER — ONDANSETRON HCL 4 MG/2ML IJ SOLN: 4.0000 mg | Freq: Four times a day (QID) | INTRAMUSCULAR | 0 refills | Status: DC | PRN

## 2024-05-09 MED ORDER — BISACODYL 10 MG RE SUPP
10.0000 mg | Freq: Every day | RECTAL | 0 refills | Status: AC
Start: 2024-05-10 — End: ?

## 2024-05-09 NOTE — Telephone Encounter (Signed)
 I have spoken to patient to advise of Dr Tena Feeling recommendation to hold off on budesonide  and entyvio  for now. Advised that entyvio  has been rescheduled for 06/06/24 and she should keep her scheduled office  follow up with Dr General Kenner on 05/23/24 for additional recommendations. She verbalizes understanding.

## 2024-05-09 NOTE — Telephone Encounter (Signed)
 Dr General Kenner-  Patient with Crohns and recent recurrent tonsillitis which has interrupted her Entyvio  infusions on a couple different occasions. Calls today having just had exploratory lap 5/9 for SBO. Patient is being discharged from hospital today and wanted to know plan of care from GI perspective. Patient was originally scheduled for visit on 5/19 but, again, was hospitalized at that time. I have rescheduled her to see Dr General Kenner on 05/23/24 at 4 pm. Patient tells me that she does have the budesonide  9 mg at home as recently prescribed but has not yet started it. I advised that she go ahead and start this to hopefully keep Crohns symptoms at bay while awaiting her office visit.   Patient is scheduled for Entyvio  infusion 05/16/24 and wants to know if she should go ahead and cancel this. Per hospital discharge note dated 05/09/24, it looks like they were leaving the decision to GI but did indicate "She is on Entyvio  as an outpatient and can resume it as an outpatient based on GI recommendations-probably may need to hold off the same until she heals a little bit more."  Please advise.

## 2024-05-09 NOTE — Telephone Encounter (Signed)
 Patient returning phone call. Please advise, thank you.

## 2024-05-09 NOTE — Discharge Summary (Signed)
 Physician Discharge Summary  Jessica [name] NWG:[id number] DOB: 12-24-1995 DOA: 04/26/2024  PCP: Patient, No Pcp Per  Admit date: 04/26/2024 Discharge date: 05/09/2024  Time spent: 15 minutes  Recommendations for Outpatient Follow-up:  Needs outpatient postop follow-up with general surgery Follow-up with gastroenterology as an outpatient regarding underlying Crohn's disease Complete Augmentin  for tonsillitis  Discharge Diagnoses:  MAIN problem for hospitalization   SBO probably secondary to Crohn's disease with remote ileal resection Tonsillitis  Please see below for itemized issues addressed in HOpsital- refer to other progress notes for clarity if needed  Discharge Condition: Improved  Diet recommendation: Regular  Filed Weights   04/26/24 1357 04/28/24 1100 04/29/24 0625  Weight: 90 kg 90 kg 91.3 kg    History of present illness:   28 y.o. female with medical history significant for Crohn's ileocolitis follows with [place] [facility] (s/p remote ileal resection) on Entyvio  who is admitted with partial small bowel obstruction.   Assessment and Plan:  Crohn's ileocolitis with small bowel obstruction-status post ex lap on 5/9 by Dr. Zadie Herter, underwent LOA -  She is on Entyvio  as an outpatient and can resume it as an outpatient based on GI recommendations-probably may need to hold off the same until she heals a little bit more -Pain control oxycodone  5-10 every 4 as needed moderate pain, Robaxin  1000 every 4--I have told her that she can take Tylenol  high-dose over-the-counter 1000-3 times a day as needed for mild pain - Noted to have evidence of ileus on 5/16 xray, now improving--tolerating diet at discharge - At follow-up will need removal of staples wound check etc. to be managed by general surgery   Probable Tonsillitis On exam neck is swollen with enlarged glands and erythema of back of throat with tonsillar swelling.  She has prior been treated for  tonsillitis Strep test was performed but is pending-she improved on Augmentin  which I prescribed for and will give her 3 more days of twice daily dosing 875 which she can continue I have stopped her nystatin  and she can continue Chloraseptic I have given her prophylactic Diflucan  in case she develops yeast infection and she can follow-up as an outpatient   Leukocytosis-perioperatively.  Monitor.  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  She would benefit from weight loss   Discharge Exam: Vitals:   05/09/24 0452 05/09/24 0826  BP: (!) 130/99 (!) 129/101  Pulse: (!) 105 (!) 106  Resp: 20 18  Temp: 98.6 F (37 C) 98.4 F (36.9 C)  SpO2: 96% 98%    Subj on day of d/c   Awake coherent no distress looks well feels fair pain in throat still present eating some however no fever no chills  General Exam on discharge  EOMI NCAT no focal deficit no icterus no pallor no wheeze no rales or rhonchi ROM intact Abdomens soft slightly tender staples in place slight distention No lower extremity edema S1-S2 no murmur  Discharge Instructions   Discharge Instructions     Diet - low sodium heart healthy   Complete by: As directed    Discharge instructions   Complete by: As directed    Continue Robaxin  and oxycodone  for pain control which should get better you may use high-dose Tylenol  1000 mg over-the-counter 3 times a day for mild pain and reserve the oxycodone  for severe pain You can continue your infusions as an outpatient and resume your Entocort at discharge Use iron  pills to build up your iron   stores etc. Follow-up with general surgery for staple removal and postop care and they should give you some precautions regarding lifting etc.   Increase activity slowly   Complete by: As directed       Allergies as of 05/09/2024   No Known Allergies      Medication List     STOP taking these medications    clindamycin  150 MG  capsule Commonly known as: CLEOCIN    dicyclomine  10 MG capsule Commonly known as: BENTYL    SUMAtriptan  50 MG tablet Commonly known as: IMITREX    Vitamin D3 125 MCG (5000 UT) Caps       TAKE these medications    acetaminophen  500 MG tablet Commonly known as: TYLENOL  Take 2 tablets (1,000 mg total) by mouth every 6 (six) hours as needed.   amoxicillin -clavulanate 875-125 MG tablet Commonly known as: AUGMENTIN  Take 1 tablet by mouth every 12 (twelve) hours for 3 days.   bisacodyl  10 MG suppository Commonly known as: DULCOLAX Place 1 suppository (10 mg total) rectally daily. Start taking on: May 10, 2024   budesonide  3 MG 24 hr capsule Commonly known as: ENTOCORT EC  Take 3 capsules (9 mg total) by mouth daily.   ferrous sulfate  325 (65 FE) MG EC tablet Take 1 tablet (325 mg total) by mouth daily with breakfast.   fluconazole  150 MG tablet Commonly known as: Diflucan  Take 1 tablet if you experience any thrush or yeast infection   lidocaine  2 % solution Commonly known as: XYLOCAINE  Use as directed 15 mLs in the mouth or throat as needed for mouth pain.   Methocarbamol  1000 MG Tabs Take 1,000 mg by mouth every 6 (six) hours as needed for muscle spasms.   ondansetron  4 MG disintegrating tablet Commonly known as: ZOFRAN -ODT Take 1 tablet (4 mg total) by mouth every 8 (eight) hours as needed for nausea or vomiting.   ondansetron  4 MG/2ML Soln injection Commonly known as: ZOFRAN  Inject 2 mLs (4 mg total) into the vein every 6 (six) hours as needed for nausea or vomiting.   Oxycodone  HCl 10 MG Tabs Take 1 tablet (10 mg total) by mouth every 6 (six) hours as needed for severe pain (pain score 7-10) (not releived by tylenol  or robaxin ).   sodium chloride  0.9 % SOLN 250 mL with vedolizumab  300 MG SOLR 300 mg Inject 300 mg into the vein every 28 (twenty-eight) days. Entyvio  every 4 weeks at [facility] infusion center.       No Known Allergies  Follow-up Information      Kinsinger, Alphonso Aschoff, MD. Go on 06/04/2024.   Specialty: General Surgery Why: at 1:50pm for post-operative follow up. please arrive 20-30 minutes early. Contact information: [address] [place] [address] [phone]                  The results of significant diagnostics from this hospitalization (including imaging, microbiology, ancillary and laboratory) are listed below for reference.    Significant Diagnostic Studies: DG Abd Portable 1V Result Date: 05/03/2024 CLINICAL DATA:  [id number] Ileus (HCC) [id number] EXAM: PORTABLE ABDOMEN - 1 VIEW COMPARISON:  Apr 26, 2024 FINDINGS: Midline surgical staples. There are several persistent dilated loops of small bowel in the RIGHT hemiabdomen. Representative loop of bowel measures approximately 4 cm in diameter. Visualized lung bases are unremarkable. Tubal ligation clips. IMPRESSION: There are several persistent dilated loops of small bowel in the RIGHT hemiabdomen. Findings are favored to reflect ileus in a postoperative setting. If concern  for obstruction, consider serial abdominal radiographs after ingestion of enteric contrast versus dedicated cross-sectional imaging. Electronically Signed   By: Clancy Crimes M.D.   On: 05/03/2024 16:43   CT ABDOMEN PELVIS W CONTRAST Result Date: 04/26/2024 CLINICAL DATA:  Abdominal pain. EXAM: CT ABDOMEN AND PELVIS WITH CONTRAST TECHNIQUE: Multidetector CT imaging of the abdomen and pelvis was performed using the standard protocol following bolus administration of intravenous contrast. RADIATION DOSE REDUCTION: This exam was performed according to the departmental dose-optimization program which includes automated exposure control, adjustment of the mA and/or kV according to patient size and/or use of iterative reconstruction technique. CONTRAST:  75mL OMNIPAQUE  IOHEXOL  350 MG/ML SOLN COMPARISON:  January 03, 2015 FINDINGS: Lower chest: No acute abnormality. Hepatobiliary: There is diffuse  fatty infiltration of the liver parenchyma. No focal liver abnormality is seen. Small folds are seen within the gallbladder fundus. No gallstones, gallbladder wall thickening, or biliary dilatation. Pancreas: Unremarkable. No pancreatic ductal dilatation or surrounding inflammatory changes. Spleen: Normal in size without focal abnormality. Adrenals/Urinary Tract: Adrenal glands are unremarkable. Kidneys are normal in size, without renal calculi or hydronephrosis. A small, stable simple left renal cyst is seen. Bladder is unremarkable. Stomach/Bowel: There is a small hiatal hernia. Surgically anastomosed bowel is seen within the mid to upper right abdomen and along the expected region of the cecum and proximal ascending colon. Multiple dilated small bowel loops are also seen throughout the abdomen and pelvis (maximum small bowel diameter of approximately 3.2 cm). A transition zone is seen within the mid right abdomen at an area of surgically anastomosed large and small bowel. Vascular/Lymphatic: No significant vascular findings are present. No enlarged abdominal or pelvic lymph nodes. Reproductive: Bilateral tubal ligation clips are seen. The uterus and bilateral adnexa are otherwise unremarkable. Other: No abdominal wall hernia or abnormality. There is a small amount of pelvic free fluid. Musculoskeletal: No acute or significant osseous findings. IMPRESSION: 1. Findings consistent with a partial small bowel obstruction with a transition zone seen within the mid right abdomen at an area of surgically anastomosed large and small bowel. 2. Small hiatal hernia. 3. Hepatic steatosis. 4. Small amount of pelvic free fluid, likely reactive in nature. Electronically Signed   By: Virgle Grime M.D.   On: 04/26/2024 20:47    Microbiology: Recent Results (from the past 240 hours)  Culture, group A strep     Status: None (Preliminary result)   Collection Time: 05/07/24 12:24 PM   Specimen: Throat  Result Value Ref  Range Status   Specimen Description THROAT  Final   Special Requests NONE  Final   Culture   Final    CULTURE REINCUBATED FOR BETTER GROWTH Performed at [facility], [address] [place], [place] [address]    Report Status PENDING  Incomplete     Labs: Basic Metabolic Panel: Recent Labs  Lab 05/04/24 0512 05/05/24 0807 05/06/24 0549 05/07/24 0518 05/08/24 0452  NA 131* 132* 132* 131* 134*  K 3.7 4.2 3.5 3.8 3.7  CL 97* 98 98 99 98  CO2 23 20* 25 25 24   GLUCOSE 88 93 105* 109* 101*  BUN <5* <5* <5* <5* <5*  CREATININE 0.60 0.55 0.56 0.60 0.61  CALCIUM  8.7* 8.9 9.1 9.0 9.3   Liver Function Tests: Recent Labs  Lab 05/04/24 0512 05/05/24 0807 05/06/24 0549 05/07/24 0518 05/08/24 0452  AST 52* 68* 38 28 22  ALT 46* 54* 48* 39 32  ALKPHOS 97 114 112 120 110  BILITOT  0.9 1.5* 0.7 0.8 0.8  PROT 7.6 7.6 8.3* 7.9 8.3*  ALBUMIN 3.1* 3.2* 3.2* 3.0* 3.2*   No results for input(s): "LIPASE", "AMYLASE" in the last 168 hours. No results for input(s): "AMMONIA" in the last 168 hours. CBC: Recent Labs  Lab 05/04/24 0512 05/05/24 0807 05/06/24 0549 05/07/24 0518 05/08/24 0452  WBC 7.9 8.1 7.4 8.7 7.9  HGB 12.5 12.3 12.5 11.9* 12.9  HCT 37.2 36.8 38.3 37.2 39.1  MCV 85.1 85.0 85.3 86.7 85.7  PLT 328 328 322 305 328   Cardiac Enzymes: No results for input(s): "CKTOTAL", "CKMB", "CKMBINDEX", "TROPONINI" in the last 168 hours. BNP: BNP (last 3 results) No results for input(s): "BNP" in the last 8760 hours.  ProBNP (last 3 results) No results for input(s): "PROBNP" in the last 8760 hours.  CBG: No results for input(s): "GLUCAP" in the last 168 hours.  Signed:  Verlie Glisson MD   [facility] 05/09/2024, 10:11 AM

## 2024-05-09 NOTE — Telephone Encounter (Signed)
 I have left a voicemail for patient to call back.  Patient's Entyvio  infusion has been rescheduled to 06/06/24 at 10 am.

## 2024-05-09 NOTE — Telephone Encounter (Signed)
 Patient requesting to speak with a nurse in regards to her next plan of care. States she will be discharged to day from the hospital from a previous surgery.   Please advise. Thank you

## 2024-05-09 NOTE — Telephone Encounter (Signed)
 Thanks for the update Dottie.  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 It was not her Crohn's.   I think she needs some time to heal from her surgery. I'd actually hold off on the budesonide  and the Entyvio  right now. Plan will likely be to resume Entyvio  in the next few weeks, I'd give her 2-3 weeks to heal from her surgery. I will see her on 6/5 to discuss further but may be good to reschedule her Entyvio  for 2-3 weeks from now, after my visit with her, just to have it on the books. Thank you

## 2024-05-09 NOTE — TOC Transition Note (Signed)
 Transition of Care [facility]) - Discharge Note   Patient Details  Name: Jessica [name] MRN: [id number] Date of Birth: 1996-09-15  Transition [facility]) CM/SW Contact:  Tom-Johnson, Angelique Ken, RN Phone Number: 05/09/2024, 10:46 AM   Clinical Narrative:     Patient is scheduled for discharge today.  Readmission Risk Assessment done. Outpatient f/u, hospital f/u and discharge instructions on AVS. No TOC needs or recommendations noted. Mother, Angie to transport at discharge.  No further TOC needs noted  Final next level of care: Home/Self Care Barriers to Discharge: Barriers Resolved   Patient Goals and CMS Choice Patient states their goals for this hospitalization and ongoing recovery are:: To return home CMS Medicare.gov Compare Post Acute Care list provided to:: Patient Choice offered to / list presented to : NA      Discharge Placement                       Discharge Plan and Services Additional resources added to the After Visit Summary for                  DME Arranged: N/A         HH Arranged: NA HH Agency: NA        Social Drivers of Health (SDOH) Interventions SDOH Screenings   Food Insecurity: No Food Insecurity (04/28/2024)  Housing: Low Risk  (04/28/2024)  Transportation Needs: No Transportation Needs (04/28/2024)  Utilities: Not At Risk (04/28/2024)  Tobacco Use: Low Risk  (04/26/2024)     Readmission Risk Interventions    04/29/2024    4:38 PM  Readmission Risk Prevention Plan  Post Dischage Appt Complete  Medication Screening Complete  Transportation Screening Complete

## 2024-05-16 ENCOUNTER — Encounter (HOSPITAL_COMMUNITY)

## 2024-05-20 ENCOUNTER — Other Ambulatory Visit: Payer: Self-pay | Admitting: Gastroenterology

## 2024-05-23 ENCOUNTER — Ambulatory Visit: Admitting: Gastroenterology

## 2024-06-05 ENCOUNTER — Telehealth: Payer: Self-pay | Admitting: Gastroenterology

## 2024-06-05 ENCOUNTER — Telehealth: Payer: Self-pay | Admitting: Pharmacy Technician

## 2024-06-05 NOTE — Telephone Encounter (Signed)
 error

## 2024-06-05 NOTE — Telephone Encounter (Signed)
 Auth Submission: NO AUTH NEEDED Site of care: Site of care: [facility] Payer: [employer] Medication & CPT/J Code(s) submitted: Entyvio  (Vedolizumab ) J3380 Diagnosis Code:  Route of submission (phone, fax, portal):  Phone # Fax # Auth type: Buy/Bill PB Units/visits requested: 300MG  Q4WKS Reference number:  Approval from: 06/05/24 to 12/18/24

## 2024-06-05 NOTE — Telephone Encounter (Signed)
 Dr General Kenner please see information below and advise-  Patient was scheduled for office visit with you for follow up and discussion of Entyvio  on 05/23/24 but she cancelled-sickness. She rescheduled for 06/24/24 but that appointment is with T.Garrett, PA-C.  Can she keep her Entyvio  appointment for 06/06/24 at this point or should she reschedule after GI follow up?

## 2024-06-05 NOTE — Telephone Encounter (Signed)
 Can you let the patient know I think okay to resume her Entyvio  tomorrow if she is otherwise feeling well. Of note she missed her last appointment in May and in June.  She must keep her appointments with us  in July if she wishes to continue to receive care here.

## 2024-06-05 NOTE — Telephone Encounter (Signed)
 Patient is advised that she may keep upcoming entyvio  infusion provided she is feeling well after recent surgery. I have also advised patient that Dr General Kenner indicates she should keep her upcoming appointment in July in order for us  to be able to continue in her care. She verbalizes understanding

## 2024-06-05 NOTE — Telephone Encounter (Signed)
 Patient is calling in regards to her appointment on tomorrow for an Entyvio  injection. She was under the impression that she needs to see doctor prior to injection but her appointment is not until 7/7. She wants to know should she still have the injection tomorrow. Please advise.

## 2024-06-06 ENCOUNTER — Ambulatory Visit (HOSPITAL_COMMUNITY)
Admission: RE | Admit: 2024-06-06 | Discharge: 2024-06-06 | Disposition: A | Discharge status: Home / Self Care | Payer: Self-pay | Source: Ambulatory Visit | Attending: Gastroenterology | Admitting: Gastroenterology

## 2024-06-06 DIAGNOSIS — K50812 Crohn's disease of both small and large intestine with intestinal obstruction: Secondary | ICD-10-CM | POA: Diagnosis not present

## 2024-06-06 MED ORDER — VEDOLIZUMAB 300 MG IV SOLR
300.0000 mg | INTRAVENOUS | Status: DC
  Administered 2024-06-06: 300 mg via INTRAVENOUS
  Filled 2024-06-06: qty 5

## 2024-06-22 NOTE — Progress Notes (Unsigned)
 Ellouise Console, PA-C [address] [place], [place]  [address] Phone: [phone]   Primary Care Physician: Patient, No Pcp Per  Primary Gastroenterologist:  Ellouise Console, PA-C / Elspeth Naval, MD   Chief Complaint: Hospital follow-up Crohn's disease and SBO       HPI:   Jessica [name] is a 28 y.o. female returns for follow-up of ileocolonic Crohn's disease.  Currently on Entyvio .  Established patient Dr. Naval.  Last office visit here 06/2023 with Delon Failing, PA-C and she was re-started on Entyvio  after being off treatment for 6 months.  Noncompliant with treatment and follow-ups.  Recently hospitalized 04/26/2024 until 05/09/2024 for SBO secondary to Crohn's disease s/p remote ileal resection.  Was currently on Entyvio .  She underwent exploratory laparotomy 04/26/2024 by Dr. Rosanne with lysis of adhesions.  Was treated with Augmentin  for tonsillitis during hospitalization.  CT abdomen pelvis with contrast 04/26/2024: 1. Findings consistent with a partial small bowel obstruction with a transition zone seen within the mid right abdomen at an area of surgically anastomosed large and small bowel. 2. Small hiatal hernia. 3. Hepatic steatosis. 4. Small amount of pelvic free fluid, likely reactive in nature.  History of Crohns diagnosed in 2012. Ileocolonic disease. She had a surgery with ileal resection with divertiing ileostomy at age 65 for emergency surgery, she thinks due to a perforation although details of this and indication for it are unclear. She was hospitalized for diversion colitis in 2015. Ileostomy takedown was done in 2016. She had a post-operative colonoscopy in February 2016 showing mildly active disease, and was placed on Humira  in March 2016. She developed antibody on this regimen, Humira  was increased and started on 6MP. The antibody level went away but Humira  level remained low despite optimal dosing. Started on Entyvio  November 2018. Follow up  colonoscopy in 2019 looked normal. Subtherapeutic Entyvio  levels 2023, increased to once monthly dosing.     09/13/2022 patient seen via video visit for chronic disease.  At that time patient had been subtherapeutic on Entyvio  level of 3 with no antibodies, colonoscopy showed single ulcer at surgical anastomosis which was more consistent with anastomotic ulcer rather than active Crohn's.  The rest of her small intestine and colon were normal.  Her Entyvio  was increased to once every 4 weeks and she was doing well.  She has declined pneumonia vaccine and COVID-vaccine in the past and has gotten flu shots.  She does have history of iron  deficiency and vitamin D  deficiency and is on supplements. History of Osteoporosis.  Continue Entyvio  to every 4 weeks.  Repeated labs that time including CBC, iron  studies, vitamin D , CMP.  She was using Bentyl  as needed for occasional pain or loose stools.  Also occasional MiraLAX.      12/29/2022  MRI enterography with no evidence of bowel obstruction or stricture, no evidence of acute small or large bowel inflammation.   Prior endoscopic evaluation: Colonoscopy 08/14/2018 - normal ileum, normal anastomosis in the ascending colon, no inflammation Colonoscopy 2/16 - patent anastomosis with some inflammatory changes, ileum with some ulcerations EGD 2/16 - normal   Fecal calprotectin 07/21/2020 - normal (43)   Iron  deficient 07/21/2020   Entyvio  level 3.9, no antibodies on 01/24/22   Colonoscopy 02/02/22: Follow-up of Crohn's disease - history of prior ileal / right colon resection, on Entyvio  with some abdominal cramping / loose stools and history Uveitis recently. Entyvio  level recently returned subtherapeutic   The perianal and digital rectal examinations  were normal. - The mid ileum contained a single (solitary) ulcer, near what appeared to be a prior surgical anastomosis in the small bowel - The remainder of the exam in the terminal ileum was normal. The ileum was  deeply intubated to eventually see the ulcer noted proximally. No other inflammatory changes noted. - There was evidence of a prior end-to-end ileo-colonic anastomosis in the ascending colon. This was patent and was characterized by healthy appearing mucosa. - A single small-mouthed diverticulum was found in the ascending colon. - Anal papilla(e) were hypertrophied. - The exam was otherwise without abnormality.  Current Outpatient Medications  Medication Sig Dispense Refill   acetaminophen  (TYLENOL ) 500 MG tablet Take 2 tablets (1,000 mg total) by mouth every 6 (six) hours as needed.     bisacodyl  (DULCOLAX) 10 MG suppository Place 1 suppository (10 mg total) rectally daily. 12 suppository 0   budesonide  (ENTOCORT EC ) 3 MG 24 hr capsule Take 3 capsules (9 mg total) by mouth daily. Office visit for further refills 90 capsule 0   ferrous sulfate  325 (65 FE) MG EC tablet Take 1 tablet (325 mg total) by mouth daily with breakfast. (Patient not taking: Reported on 04/26/2024) 30 tablet 0   fluconazole  (DIFLUCAN ) 150 MG tablet Take 1 tablet if you experience any thrush or yeast infection 1 tablet 0   lidocaine  (XYLOCAINE ) 2 % solution Use as directed 15 mLs in the mouth or throat as needed for mouth pain. (Patient not taking: Reported on 04/26/2024) 100 mL 0   methocarbamol  1000 MG TABS Take 1,000 mg by mouth every 6 (six) hours as needed for muscle spasms. 50 tablet 0   ondansetron  (ZOFRAN ) 4 MG/2ML SOLN injection Inject 2 mLs (4 mg total) into the vein every 6 (six) hours as needed for nausea or vomiting. 2 mL 0   ondansetron  (ZOFRAN -ODT) 4 MG disintegrating tablet Take 1 tablet (4 mg total) by mouth every 8 (eight) hours as needed for nausea or vomiting. (Patient not taking: Reported on 04/26/2024) 20 tablet 0   oxyCODONE  10 MG TABS Take 1 tablet (10 mg total) by mouth every 6 (six) hours as needed for severe pain (pain score 7-10) (not releived by tylenol  or robaxin ). 20 tablet 0   sodium chloride  0.9 %  SOLN 250 mL with vedolizumab  300 MG SOLR 300 mg Inject 300 mg into the vein every 28 (twenty-eight) days. Entyvio  every 4 weeks at [facility] infusion center.     No current facility-administered medications for this visit.    Allergies as of 06/24/2024   (No Known Allergies)    Past Medical History:  Diagnosis Date   Anemia    Crohn's disease (HCC)    Osteoporosis    Supervision of normal first pregnancy 05/03/2013   Uveitis    Vitamin D  deficiency     Past Surgical History:  Procedure Laterality Date   COLONOSCOPY N/A 01/22/2015   Procedure: COLONOSCOPY;  Surgeon: Princella CHRISTELLA Nida, MD;  Location: [facility];  Service: Endoscopy;  Laterality: N/A;   DILATION AND EVACUATION N/A 10/31/2014   Procedure: DILATATION AND EVACUATION;  Surgeon: Olam Mill, MD;  Location: [facility];  Service: Gynecology;  Laterality: N/A;   ESOPHAGOGASTRODUODENOSCOPY N/A 01/22/2015   Procedure: ESOPHAGOGASTRODUODENOSCOPY (EGD);  Surgeon: Princella CHRISTELLA Nida, MD;  Location: [facility];  Service: Endoscopy;  Laterality: N/A;   FLEXIBLE SIGMOIDOSCOPY N/A 11/04/2014   Procedure: FLEXIBLE SIGMOIDOSCOPY;  Surgeon: Princella CHRISTELLA Nida, MD;  Location: [facility];  Service: Endoscopy;  Laterality: N/A;  ILEOSTOMY CLOSURE N/A 12/18/2014   Procedure: LOOP ILEOSTOMY REVERSAL;  Surgeon: Bernarda Ned, MD;  Location: [facility];  Service: General;  Laterality: N/A;   INCISION AND DRAINAGE Left    arm   LAPAROSCOPIC TUBAL LIGATION Bilateral 02/19/2020   Procedure: LAPAROSCOPIC TUBAL LIGATION;  Surgeon: Eveline Lynwood MATSU, MD;  Location: [facility];  Service: Gynecology;  Laterality: Bilateral;   LAPAROTOMY N/A 04/26/2024   Procedure: LAPAROTOMY, EXPLORATORY, LYSIS OF ADHESIONS;  Surgeon: Kinsinger, Herlene Righter, MD;  Location: [facility];  Service: General;  Laterality: N/A;   SMALL INTESTINE SURGERY     WISDOM TOOTH EXTRACTION      Review of Systems:    All systems reviewed and negative except where noted in HPI.    Physical  Exam:  There were no vitals taken for this visit. No LMP recorded.  General: Well-nourished, well-developed in no acute distress.  Lungs: Clear to auscultation bilaterally. Non-labored. Heart: Regular rate and rhythm, no murmurs rubs or gallops.  Abdomen: Bowel sounds are normal; Abdomen is Soft; No hepatosplenomegaly, masses or hernias;  No Abdominal Tenderness; No guarding or rebound tenderness. Neuro: Alert and oriented x 3.  Grossly intact.  Psych: Alert and cooperative, normal mood and affect.   Imaging Studies: No results found.  Labs: CBC    Component Value Date/Time   WBC 7.9 05/08/2024 0452   RBC 4.56 05/08/2024 0452   HGB 12.9 05/08/2024 0452   HGB 10.1 (L) 10/16/2019 1420   HGB 11.0 01/23/2013 0000   HCT 39.1 05/08/2024 0452   HCT 32.5 (L) 10/16/2019 1420   HCT 33 01/23/2013 0000   PLT 328 05/08/2024 0452   PLT 271 10/16/2019 1420   PLT 226 01/23/2013 0000   MCV 85.7 05/08/2024 0452   MCV 77 (L) 10/16/2019 1420   MCH 28.3 05/08/2024 0452   MCHC 33.0 05/08/2024 0452   RDW 13.2 05/08/2024 0452   RDW 13.0 10/16/2019 1420   LYMPHSABS 2.1 04/26/2024 1716   LYMPHSABS 1.4 04/18/2019 1028   MONOABS 0.8 04/26/2024 1716   EOSABS 0.1 04/26/2024 1716   EOSABS 0.0 04/18/2019 1028   BASOSABS 0.0 04/26/2024 1716   BASOSABS 0.0 04/18/2019 1028    CMP     Component Value Date/Time   NA 134 (L) 05/08/2024 0452   NA 138 10/16/2019 1420   K 3.7 05/08/2024 0452   CL 98 05/08/2024 0452   CO2 24 05/08/2024 0452   GLUCOSE 101 (H) 05/08/2024 0452   BUN <5 (L) 05/08/2024 0452   BUN 6 10/16/2019 1420   CREATININE 0.61 05/08/2024 0452   CALCIUM  9.3 05/08/2024 0452   PROT 8.3 (H) 05/08/2024 0452   PROT 6.8 10/16/2019 1420   ALBUMIN 3.2 (L) 05/08/2024 0452   ALBUMIN 3.7 (L) 10/16/2019 1420   AST 22 05/08/2024 0452   ALT 32 05/08/2024 0452   ALKPHOS 110 05/08/2024 0452   BILITOT 0.8 05/08/2024 0452   BILITOT 0.3 10/16/2019 1420   GFRNONAA >60 05/08/2024 0452   GFRAA  >60 10/19/2019 1655       Assessment and Plan:   Jessica [name] is a 28 y.o. y/o female returns for hospital follow-up of small bowel obstruction s/p exploratory laparotomy with lysis of adhesions due to Crohn's disease.  1.  Crohn's disease - Lab: CBC, CMP, CRP, vedolizumab , anti-vedolizumab  antibody - Fecal calprotectin - Continue Entyvio  every 4 weeks  2.  Recent small bowel obstruction s/p exploratory laparotomy with lysis of adhesions - Abdominal x-ray - Continue follow-up  with general surgeon Dr. Augustus  3.  Vitamin D  deficiency - Lab: Vitamin D   4.  Iron  deficiency anemia - Lab: CBC, iron  panel, ferritin, B12  5.  Chronic constipation - Continue MiraLAX  Ellouise Console, PA-C  Follow up with Dr. Leigh in 3 months

## 2024-06-24 ENCOUNTER — Encounter: Payer: Self-pay | Admitting: Physician Assistant

## 2024-06-24 ENCOUNTER — Ambulatory Visit (INDEPENDENT_AMBULATORY_CARE_PROVIDER_SITE_OTHER)
Admission: RE | Admit: 2024-06-24 | Discharge: 2024-06-24 | Disposition: A | Discharge status: Home / Self Care | Source: Ambulatory Visit | Attending: Physician Assistant | Admitting: Physician Assistant

## 2024-06-24 ENCOUNTER — Ambulatory Visit: Admitting: Physician Assistant

## 2024-06-24 ENCOUNTER — Other Ambulatory Visit (INDEPENDENT_AMBULATORY_CARE_PROVIDER_SITE_OTHER)

## 2024-06-24 VITALS — BP 120/70 | HR 89 | Ht 66.0 in | Wt 195.0 lb

## 2024-06-24 DIAGNOSIS — D649 Anemia, unspecified: Secondary | ICD-10-CM

## 2024-06-24 DIAGNOSIS — E559 Vitamin D deficiency, unspecified: Secondary | ICD-10-CM

## 2024-06-24 DIAGNOSIS — D509 Iron deficiency anemia, unspecified: Secondary | ICD-10-CM

## 2024-06-24 DIAGNOSIS — Z8719 Personal history of other diseases of the digestive system: Secondary | ICD-10-CM | POA: Diagnosis not present

## 2024-06-24 DIAGNOSIS — K5080 Crohn's disease of both small and large intestine without complications: Secondary | ICD-10-CM

## 2024-06-24 DIAGNOSIS — K56609 Unspecified intestinal obstruction, unspecified as to partial versus complete obstruction: Secondary | ICD-10-CM | POA: Diagnosis not present

## 2024-06-24 LAB — COMPREHENSIVE METABOLIC PANEL WITH GFR
ALT: 16 U/L (ref 0–35)
AST: 18 U/L (ref 0–37)
Albumin: 4.2 g/dL (ref 3.5–5.2)
Alkaline Phosphatase: 76 U/L (ref 39–117)
BUN: 8 mg/dL (ref 6–23)
CO2: 26 meq/L (ref 19–32)
Calcium: 9.1 mg/dL (ref 8.4–10.5)
Chloride: 104 meq/L (ref 96–112)
Creatinine, Ser: 0.6 mg/dL (ref 0.40–1.20)
GFR: 121.08 mL/min (ref >60.00)
Glucose, Bld: 92 mg/dL (ref 70–99)
Potassium: 3.5 meq/L (ref 3.5–5.1)
Sodium: 137 meq/L (ref 135–145)
Total Bilirubin: 0.5 mg/dL (ref 0.2–1.2)
Total Protein: 7.8 g/dL (ref 6.0–8.3)

## 2024-06-24 LAB — CBC WITH DIFFERENTIAL/PLATELET
Basophils Absolute: 0 K/uL (ref 0.0–0.1)
Basophils Relative: 0.4 % (ref 0.0–3.0)
Eosinophils Absolute: 0.2 K/uL (ref 0.0–0.7)
Eosinophils Relative: 2.1 % (ref 0.0–5.0)
HCT: 39.6 % (ref 36.0–46.0)
Hemoglobin: 13.1 g/dL (ref 12.0–15.0)
Lymphocytes Relative: 27.7 % (ref 12.0–46.0)
Lymphs Abs: 2.2 K/uL (ref 0.7–4.0)
MCHC: 33.2 g/dL (ref 30.0–36.0)
MCV: 85.2 fl (ref 78.0–100.0)
Monocytes Absolute: 0.7 K/uL (ref 0.1–1.0)
Monocytes Relative: 8.7 % (ref 3.0–12.0)
Neutro Abs: 4.9 K/uL (ref 1.4–7.7)
Neutrophils Relative %: 61.1 % (ref 43.0–77.0)
Platelets: 361 K/uL (ref 150.0–400.0)
RBC: 4.65 Mil/uL (ref 3.87–5.11)
RDW: 15.2 % (ref 11.5–15.5)
WBC: 8 K/uL (ref 4.0–10.5)

## 2024-06-24 LAB — B12 AND FOLATE PANEL
Folate: 3.9 ng/mL — ABNORMAL LOW (ref >5.9)
Vitamin B-12: 455 pg/mL (ref 211–911)

## 2024-06-24 LAB — C-REACTIVE PROTEIN: CRP: <1 mg/dL (ref 0.5–20.0)

## 2024-06-24 LAB — VITAMIN D 25 HYDROXY (VIT D DEFICIENCY, FRACTURES): VITD: 16.35 ng/mL — ABNORMAL LOW (ref 30.00–100.00)

## 2024-06-24 NOTE — Patient Instructions (Addendum)
 Your provider has requested that you go to the basement level for lab work before leaving today. Press B on the elevator. The lab is located at the first door on the left as you exit the elevator.  Please go to the X-ray department to have your X-ray done  Continue taking your Entyvio  300mg  every 4 weeks  Please follow up sooner if symptoms increase or worsen  Due to recent changes in healthcare laws, you may see the results of your imaging and laboratory studies on MyChart before your provider has had a chance to review them.  We understand that in some cases there may be results that are confusing or concerning to you. Not all laboratory results come back in the same time frame and the provider may be waiting for multiple results in order to interpret others.  Please give us  48 hours in order for your provider to thoroughly review all the results before contacting the office for clarification of your results.   Thank you for trusting me with your gastrointestinal care!   Ellouise Console, PA-C _______________________________________________________  If your blood pressure at your visit was 140/90 or greater, please contact your primary care physician to follow up on this.  _______________________________________________________  If you are age 78 or older, your body mass index should be between 23-30. Your Body mass index is 31.47 kg/m. If this is out of the aforementioned range listed, please consider follow up with your Primary Care Provider.  If you are age 34 or younger, your body mass index should be between 19-25. Your Body mass index is 31.47 kg/m. If this is out of the aformentioned range listed, please consider follow up with your Primary Care Provider.   ________________________________________________________  The [facility] providers would like to encourage you to use MYCHART to communicate with providers for non-urgent requests or questions.  Due to long hold times on the  telephone, sending your provider a message by [facility] may be a faster and more efficient way to get a response.  Please allow 48 business hours for a response.  Please remember that this is for non-urgent requests.  _______________________________________________________

## 2024-06-25 ENCOUNTER — Ambulatory Visit: Payer: Self-pay | Admitting: Physician Assistant

## 2024-06-25 DIAGNOSIS — E538 Deficiency of other specified B group vitamins: Secondary | ICD-10-CM

## 2024-06-25 DIAGNOSIS — D509 Iron deficiency anemia, unspecified: Secondary | ICD-10-CM

## 2024-06-25 DIAGNOSIS — E559 Vitamin D deficiency, unspecified: Secondary | ICD-10-CM

## 2024-06-25 LAB — IRON,TIBC AND FERRITIN PANEL
%SAT: 11 % — ABNORMAL LOW (ref 16–45)
Ferritin: 7 ng/mL — ABNORMAL LOW (ref 16–154)
Iron: 43 ug/dL (ref 40–190)
TIBC: 408 ug/dL (ref 250–450)

## 2024-06-25 MED ORDER — FOLIC ACID 1 MG PO TABS: 1.0000 mg | ORAL_TABLET | Freq: Every day | ORAL | 2 refills | Status: AC

## 2024-06-25 MED ORDER — VITAMIN D (ERGOCALCIFEROL) 1.25 MG (50000 UNIT) PO CAPS: 50000.0000 [IU] | ORAL_CAPSULE | ORAL | 1 refills | Status: AC

## 2024-06-26 NOTE — Progress Notes (Signed)
 Agree with assessment with the following thoughts.  While this patient has Crohn's, it appears it was NOT the cause of her obstruction. Per operative report and surgical notes: The right side of the abdomen had a thick cord from the lateral abdominal wall to the omentum creating an internal hernia that was entrapping the small bowel anastomosis. There was evidence of a chronic scar just proximal to the small intestine small intestine anastomosis. The cord was removed with ligasure. This allows untwisting of the ileum. There was a narrowing just proximal to the ileocolostomy as well. Additional lysis allowed more freedom to the area and liquid easily passed from the mid jejunum through to the colon.  Agree with fecal calprotectin to assess for inflammation but I do not think that this obstruction was considered a failure of Entyvio , sounds more like adhesive disease was causing her obstruction and status post surgery she is doing well.  She should continue Entyvio  and will await fecal calprotectin

## 2024-07-04 ENCOUNTER — Encounter (HOSPITAL_COMMUNITY)

## 2024-07-19 ENCOUNTER — Other Ambulatory Visit

## 2024-07-19 ENCOUNTER — Encounter (HOSPITAL_COMMUNITY)
Admission: RE | Admit: 2024-07-19 | Discharge: 2024-07-19 | Disposition: A | Discharge status: Home / Self Care | Source: Ambulatory Visit | Attending: Gastroenterology | Admitting: Gastroenterology

## 2024-07-19 DIAGNOSIS — K5080 Crohn's disease of both small and large intestine without complications: Secondary | ICD-10-CM

## 2024-07-19 MED ORDER — VEDOLIZUMAB 300 MG IV SOLR
300.0000 mg | INTRAVENOUS | Status: DC
  Administered 2024-07-19: 300 mg via INTRAVENOUS
  Filled 2024-07-19: qty 5

## 2024-07-23 LAB — CALPROTECTIN, FECAL: Calprotectin, Fecal: 66 ug/g (ref 0–120)

## 2024-08-01 ENCOUNTER — Encounter (HOSPITAL_COMMUNITY)

## 2024-08-16 ENCOUNTER — Inpatient Hospital Stay (HOSPITAL_COMMUNITY): Admission: RE | Admit: 2024-08-16 | Source: Ambulatory Visit

## 2024-08-23 ENCOUNTER — Ambulatory Visit (HOSPITAL_COMMUNITY)
Admission: RE | Admit: 2024-08-23 | Discharge: 2024-08-23 | Disposition: A | Discharge status: Home / Self Care | Source: Ambulatory Visit | Attending: Gastroenterology | Admitting: Gastroenterology

## 2024-08-23 DIAGNOSIS — K5090 Crohn's disease, unspecified, without complications: Secondary | ICD-10-CM | POA: Diagnosis not present

## 2024-08-23 MED ORDER — VEDOLIZUMAB 300 MG IV SOLR
300.0000 mg | INTRAVENOUS | Status: DC
  Administered 2024-08-23: 300 mg via INTRAVENOUS
  Filled 2024-08-23: qty 5

## 2024-09-13 ENCOUNTER — Inpatient Hospital Stay (HOSPITAL_COMMUNITY): Admission: RE | Admit: 2024-09-13 | Source: Ambulatory Visit

## 2024-09-20 ENCOUNTER — Inpatient Hospital Stay (HOSPITAL_COMMUNITY): Admission: RE | Admit: 2024-09-20 | Source: Ambulatory Visit

## 2024-09-20 ENCOUNTER — Encounter (HOSPITAL_COMMUNITY)

## 2024-09-23 ENCOUNTER — Encounter: Payer: Self-pay | Admitting: *Deleted

## 2024-10-01 ENCOUNTER — Encounter (HOSPITAL_COMMUNITY)

## 2024-10-18 ENCOUNTER — Ambulatory Visit: Admitting: Gastroenterology

## 2024-10-18 ENCOUNTER — Encounter (HOSPITAL_COMMUNITY)

## 2024-10-18 NOTE — Progress Notes (Deleted)
 HPI :  Crohn's history History of Crohns diagnosed in 2012. Ileocolonic disease. She had a surgery with ileal resection with divertiing ileostomy at age 28 for emergency surgery, she thinks due to a perforation although details of this and indication for it are unclear. She was hospitalized for diversion colitis in 2015. Ileostomy takedown was done in 2016. She had a post-operative colonoscopy in February 2016 showing mildly active disease, and was placed on Humira  in March 2016. She developed antibody on this regimen, Humira  was increased and started on . The antibody level went away but Humira  level remained low despite optimal dosing. Started on Entyvio  November 2018. Follow up colonoscopy in 2019 looked normal. Subtherapeutic Entyvio  levels 2023, increased to once monthly dosing.    Noncompliant with treatment and follow-ups.   Recently hospitalized 04/26/2024 until 05/09/2024 for SBO secondary to Crohn's disease s/p remote ileal resection.  Was currently on Entyvio .  She underwent exploratory laparotomy 04/26/2024 by Dr. Rosanne with lysis of adhesions.  Was treated with Augmentin  for tonsillitis during hospitalization.  Entyvio  was held during hospitalization.  She was restarted on Entyvio  infusion 3 weeks ago 300 Mg.  Last Entyvio  infusion 06/06/2024.  CT abdomen pelvis with contrast 04/26/2024: 1. Findings consistent with a partial small bowel obstruction with a transition zone seen within the mid right abdomen at an area of surgically anastomosed large and small bowel. 2. Small hiatal hernia. 3. Hepatic steatosis. 4. Small amount of pelvic free fluid, likely reactive in nature.   Agree with assessment with the following thoughts.   While this patient has Crohn's, it appears it was NOT the cause of her obstruction. Per operative report and surgical notes: The right side of the abdomen had a thick cord from the lateral abdominal wall to the omentum creating an internal hernia that was  entrapping the small bowel anastomosis. There was evidence of a chronic scar just proximal to the small intestine small intestine anastomosis. The cord was removed with ligasure. This allows untwisting of the ileum. There was a narrowing just proximal to the ileocolostomy as well. Additional lysis allowed more freedom to the area and liquid easily passed from the mid jejunum through to the colon.   Agree with fecal calprotectin to assess for inflammation but I do not think that this obstruction was considered a failure of Entyvio , sounds more like adhesive disease was causing her obstruction and status post surgery she is doing well.  She should continue Entyvio  and will await fecal calprotectin    Fecal calprotectin 07/19/24 - 66         Prior endoscopic evaluation: Colonoscopy 08/14/2018 - normal ileum, normal anastomosis in the ascending colon, no inflammation Colonoscopy 2/16 - patent anastomosis with some inflammatory changes, ileum with some ulcerations EGD 2/16 - normal   Fecal calprotectin 07/21/2020 - normal (43)   Iron  deficient 07/21/2020   Entyvio  level 3.9, no antibodies on 01/24/22   Colonoscopy 02/02/22: Follow-up of Crohn's disease - history of prior ileal / right colon resection, on Entyvio  with some abdominal cramping / loose stools and history Uveitis recently. Entyvio  level recently returned subtherapeutic   The perianal and digital rectal examinations were normal. - The mid ileum contained a single (solitary) ulcer, near what appeared to be a prior surgical anastomosis in the small bowel - The remainder of the exam in the terminal ileum was normal. The ileum was deeply intubated to eventually see the ulcer noted proximally. No other inflammatory changes noted. - There was evidence of  a prior end-to-end ileo-colonic anastomosis in the ascending colon. This was patent and was characterized by healthy appearing mucosa. - A single small-mouthed diverticulum was found in  the ascending colon. - Anal papilla(e) were hypertrophied. - The exam was otherwise without abnormality.   Past Medical History:  Diagnosis Date   Anemia    Crohn's disease (HCC)    Osteoporosis    SBO (small bowel obstruction) (HCC)    Supervision of normal first pregnancy 05/03/2013   Uveitis    Vitamin D  deficiency      Past Surgical History:  Procedure Laterality Date   COLONOSCOPY N/A 01/22/2015   Procedure: COLONOSCOPY;  Surgeon: Princella CHRISTELLA Nida, MD;  Location: [facility];  Service: Endoscopy;  Laterality: N/A;   DILATION AND EVACUATION N/A 10/31/2014   Procedure: DILATATION AND EVACUATION;  Surgeon: Olam Mill, MD;  Location: [facility];  Service: Gynecology;  Laterality: N/A;   ESOPHAGOGASTRODUODENOSCOPY N/A 01/22/2015   Procedure: ESOPHAGOGASTRODUODENOSCOPY (EGD);  Surgeon: Princella CHRISTELLA Nida, MD;  Location: [facility];  Service: Endoscopy;  Laterality: N/A;   FLEXIBLE SIGMOIDOSCOPY N/A 11/04/2014   Procedure: FLEXIBLE SIGMOIDOSCOPY;  Surgeon: Princella CHRISTELLA Nida, MD;  Location: [facility];  Service: Endoscopy;  Laterality: N/A;   ILEOSTOMY CLOSURE N/A 12/18/2014   Procedure: LOOP ILEOSTOMY REVERSAL;  Surgeon: Bernarda Ned, MD;  Location: [facility];  Service: General;  Laterality: N/A;   INCISION AND DRAINAGE Left    arm   LAPAROSCOPIC TUBAL LIGATION Bilateral 02/19/2020   Procedure: LAPAROSCOPIC TUBAL LIGATION;  Surgeon: Eveline Lynwood MATSU, MD;  Location: [facility];  Service: Gynecology;  Laterality: Bilateral;   LAPAROTOMY N/A 04/26/2024   Procedure: LAPAROTOMY, EXPLORATORY, LYSIS OF ADHESIONS;  Surgeon: Kinsinger, Herlene Righter, MD;  Location: [facility];  Service: General;  Laterality: N/A;   SMALL INTESTINE SURGERY     WISDOM TOOTH EXTRACTION     Family History  Problem Relation Age of Onset   Diabetes Father    Diabetes Maternal Grandmother    Heart disease Maternal Grandmother    Hypertension Maternal Grandmother    Kidney disease Maternal Grandmother    Lung  cancer Maternal Grandfather    Hyperlipidemia Maternal Grandfather    Stroke Neg Hx    Colon cancer Neg Hx    Colon polyps Neg Hx    Esophageal cancer Neg Hx    Rectal cancer Neg Hx    Stomach cancer Neg Hx    Social History   Tobacco Use   Smoking status: Never   Smokeless tobacco: Never   Tobacco comments:    occasional cigarette  Vaping Use   Vaping status: Never Used  Substance Use Topics   Alcohol use: No    Alcohol/week: 0.0 standard drinks of alcohol   Drug use: No   Current Outpatient Medications  Medication Sig Dispense Refill   acetaminophen  (TYLENOL ) 500 MG tablet Take 2 tablets (1,000 mg total) by mouth every 6 (six) hours as needed.     bisacodyl  (DULCOLAX) 10 MG suppository Place 1 suppository (10 mg total) rectally daily. 12 suppository 0   budesonide  (ENTOCORT EC ) 3 MG 24 hr capsule Take 3 capsules (9 mg total) by mouth daily. Office visit for further refills (Patient not taking: Reported on 06/24/2024) 90 capsule 0   ferrous sulfate  325 (65 FE) MG EC tablet Take 1 tablet (325 mg total) by mouth daily with breakfast. (Patient not taking: Reported on 04/26/2024) 30 tablet 0   fluconazole  (DIFLUCAN ) 150 MG tablet Take 1 tablet if you  experience any thrush or yeast infection 1 tablet 0   folic acid  (FOLVITE ) 1 MG tablet Take 1 tablet (1 mg total) by mouth daily. 30 tablet 2   lidocaine  (XYLOCAINE ) 2 % solution Use as directed 15 mLs in the mouth or throat as needed for mouth pain. (Patient not taking: Reported on 04/26/2024) 100 mL 0   methocarbamol  1000 MG TABS Take 1,000 mg by mouth every 6 (six) hours as needed for muscle spasms. 50 tablet 0   ondansetron  (ZOFRAN -ODT) 4 MG disintegrating tablet Take 1 tablet (4 mg total) by mouth every 8 (eight) hours as needed for nausea or vomiting. (Patient not taking: Reported on 04/26/2024) 20 tablet 0   oxyCODONE  10 MG TABS Take 1 tablet (10 mg total) by mouth every 6 (six) hours as needed for severe pain (pain score 7-10) (not  releived by tylenol  or robaxin ). 20 tablet 0   sodium chloride  0.9 % SOLN 250 mL with vedolizumab  300 MG SOLR 300 mg Inject 300 mg into the vein every 28 (twenty-eight) days. Entyvio  every 4 weeks at [facility] infusion center.     Vitamin D , Ergocalciferol , (DRISDOL ) 1.25 MG (50000 UNIT) CAPS capsule Take 1 capsule (50,000 Units total) by mouth every 7 (seven) days. 12 capsule 1   No current facility-administered medications for this visit.   No Known Allergies   Review of Systems: All systems reviewed and negative except where noted in HPI.    No results found.  Physical Exam: There were no vitals taken for this visit. Constitutional: Pleasant,well-developed, ***female in no acute distress. HEENT: Normocephalic and atraumatic. Conjunctivae are normal. No scleral icterus. Neck supple.  Cardiovascular: Normal rate, regular rhythm.  Pulmonary/chest: Effort normal and breath sounds normal. No wheezing, rales or rhonchi. Abdominal: Soft, nondistended, nontender. Bowel sounds active throughout. There are no masses palpable. No hepatomegaly. Extremities: no edema Lymphadenopathy: No cervical adenopathy noted. Neurological: Alert and oriented to person place and time. Skin: Skin is warm and dry. No rashes noted. Psychiatric: Normal mood and affect. Behavior is normal.   ASSESSMENT: 28 y.o. female here for assessment of the following  No diagnosis found.  PLAN:   No ref. provider found

## 2024-10-28 ENCOUNTER — Other Ambulatory Visit (INDEPENDENT_AMBULATORY_CARE_PROVIDER_SITE_OTHER)

## 2024-10-28 ENCOUNTER — Ambulatory Visit: Payer: Self-pay | Admitting: Physician Assistant

## 2024-10-28 DIAGNOSIS — E559 Vitamin D deficiency, unspecified: Secondary | ICD-10-CM

## 2024-10-28 DIAGNOSIS — D509 Iron deficiency anemia, unspecified: Secondary | ICD-10-CM | POA: Diagnosis not present

## 2024-10-28 DIAGNOSIS — E538 Deficiency of other specified B group vitamins: Secondary | ICD-10-CM

## 2024-10-28 LAB — CBC WITH DIFFERENTIAL/PLATELET
Basophils Absolute: 0 K/uL (ref 0.0–0.1)
Basophils Relative: 0.5 % (ref 0.0–3.0)
Eosinophils Absolute: 0.2 K/uL (ref 0.0–0.7)
Eosinophils Relative: 2.2 % (ref 0.0–5.0)
HCT: 40.7 % (ref 36.0–46.0)
Hemoglobin: 13.4 g/dL (ref 12.0–15.0)
Lymphocytes Relative: 30.1 % (ref 12.0–46.0)
Lymphs Abs: 2.4 K/uL (ref 0.7–4.0)
MCHC: 32.9 g/dL (ref 30.0–36.0)
MCV: 89.8 fl (ref 78.0–100.0)
Monocytes Absolute: 0.6 K/uL (ref 0.1–1.0)
Monocytes Relative: 7.9 % (ref 3.0–12.0)
Neutro Abs: 4.7 K/uL (ref 1.4–7.7)
Neutrophils Relative %: 59.3 % (ref 43.0–77.0)
Platelets: 261 K/uL (ref 150.0–400.0)
RBC: 4.53 Mil/uL (ref 3.87–5.11)
RDW: 15.3 % (ref 11.5–15.5)
WBC: 8 K/uL (ref 4.0–10.5)

## 2024-10-28 LAB — IBC + FERRITIN
Ferritin: 10.5 ng/mL (ref 10.0–291.0)
Iron: 61 ug/dL (ref 42–145)
Saturation Ratios: 13.6 % — ABNORMAL LOW (ref 20.0–50.0)
TIBC: 449.4 ug/dL (ref 250.0–450.0)
Transferrin: 321 mg/dL (ref 212.0–360.0)

## 2024-10-28 LAB — FOLATE: Folate: 7.9 ng/mL (ref >5.9)

## 2024-10-28 LAB — VITAMIN D 25 HYDROXY (VIT D DEFICIENCY, FRACTURES): VITD: 21.69 ng/mL — ABNORMAL LOW (ref 30.00–100.00)

## 2024-10-29 ENCOUNTER — Encounter (HOSPITAL_COMMUNITY)

## 2024-11-21 ENCOUNTER — Inpatient Hospital Stay (HOSPITAL_COMMUNITY)
Admission: RE | Admit: 2024-11-21 | Discharge: 2024-11-21 | Disposition: A | Source: Ambulatory Visit | Attending: Gastroenterology

## 2024-11-21 VITALS — BP 138/104 | HR 95 | Temp 98.3°F | Resp 16

## 2024-11-21 DIAGNOSIS — K50812 Crohn's disease of both small and large intestine with intestinal obstruction: Secondary | ICD-10-CM | POA: Diagnosis present

## 2024-11-21 MED ORDER — SODIUM CHLORIDE 0.9 % IV SOLN: INTRAVENOUS | Status: DC

## 2024-11-21 MED ORDER — VEDOLIZUMAB 300 MG IV SOLR
300.0000 mg | Freq: Once | INTRAVENOUS | Status: AC
  Administered 2024-11-21: 300 mg via INTRAVENOUS
  Filled 2024-11-21: qty 5

## 2024-12-16 ENCOUNTER — Encounter (HOSPITAL_COMMUNITY): Payer: Self-pay

## 2024-12-16 ENCOUNTER — Inpatient Hospital Stay (HOSPITAL_COMMUNITY)
Admission: EM | Admit: 2024-12-16 | Discharge: 2024-12-20 | DRG: 389 | Disposition: A | Discharge status: Home / Self Care | Attending: Family Medicine | Admitting: Family Medicine

## 2024-12-16 ENCOUNTER — Other Ambulatory Visit: Payer: Self-pay

## 2024-12-16 DIAGNOSIS — F17210 Nicotine dependence, cigarettes, uncomplicated: Secondary | ICD-10-CM | POA: Diagnosis present

## 2024-12-16 DIAGNOSIS — D509 Iron deficiency anemia, unspecified: Secondary | ICD-10-CM | POA: Diagnosis present

## 2024-12-16 DIAGNOSIS — Z833 Family history of diabetes mellitus: Secondary | ICD-10-CM

## 2024-12-16 DIAGNOSIS — Z8249 Family history of ischemic heart disease and other diseases of the circulatory system: Secondary | ICD-10-CM

## 2024-12-16 DIAGNOSIS — K50912 Crohn's disease, unspecified, with intestinal obstruction: Secondary | ICD-10-CM

## 2024-12-16 DIAGNOSIS — K5730 Diverticulosis of large intestine without perforation or abscess without bleeding: Secondary | ICD-10-CM | POA: Diagnosis present

## 2024-12-16 DIAGNOSIS — R109 Unspecified abdominal pain: Secondary | ICD-10-CM | POA: Diagnosis present

## 2024-12-16 DIAGNOSIS — K429 Umbilical hernia without obstruction or gangrene: Secondary | ICD-10-CM | POA: Diagnosis present

## 2024-12-16 DIAGNOSIS — K50812 Crohn's disease of both small and large intestine with intestinal obstruction: Principal | ICD-10-CM | POA: Diagnosis present

## 2024-12-16 DIAGNOSIS — I10 Essential (primary) hypertension: Secondary | ICD-10-CM | POA: Diagnosis present

## 2024-12-16 DIAGNOSIS — K565 Intestinal adhesions [bands], unspecified as to partial versus complete obstruction: Principal | ICD-10-CM | POA: Diagnosis present

## 2024-12-16 DIAGNOSIS — Z432 Encounter for attention to ileostomy: Secondary | ICD-10-CM

## 2024-12-16 DIAGNOSIS — K56609 Unspecified intestinal obstruction, unspecified as to partial versus complete obstruction: Principal | ICD-10-CM

## 2024-12-16 DIAGNOSIS — E876 Hypokalemia: Secondary | ICD-10-CM | POA: Diagnosis present

## 2024-12-16 DIAGNOSIS — M810 Age-related osteoporosis without current pathological fracture: Secondary | ICD-10-CM | POA: Diagnosis present

## 2024-12-16 LAB — CBC
HCT: 43.8 % (ref 36.0–46.0)
Hemoglobin: 14.3 g/dL (ref 12.0–15.0)
MCH: 29.5 pg (ref 26.0–34.0)
MCHC: 32.6 g/dL (ref 30.0–36.0)
MCV: 90.3 fL (ref 80.0–100.0)
Platelets: 397 K/uL (ref 150–400)
RBC: 4.85 MIL/uL (ref 3.87–5.11)
RDW: 12.7 % (ref 11.5–15.5)
WBC: 14.9 K/uL — ABNORMAL HIGH (ref 4.0–10.5)
nRBC: 0 % (ref 0.0–0.2)

## 2024-12-16 LAB — HCG, SERUM, QUALITATIVE: Preg, Serum: NEGATIVE

## 2024-12-16 MED ORDER — ONDANSETRON HCL 4 MG/2ML IJ SOLN
4.0000 mg | Freq: Once | INTRAMUSCULAR | Status: AC
  Administered 2024-12-16: 4 mg via INTRAVENOUS
  Filled 2024-12-16: qty 2

## 2024-12-16 MED ORDER — MORPHINE SULFATE (PF) 2 MG/ML IV SOLN
4.0000 mg | Freq: Once | INTRAVENOUS | Status: AC
  Administered 2024-12-16: 4 mg via INTRAVENOUS
  Filled 2024-12-16: qty 2

## 2024-12-16 MED ORDER — MORPHINE SULFATE (PF) 2 MG/ML IV SOLN
2.0000 mg | Freq: Once | INTRAVENOUS | Status: AC
  Administered 2024-12-16: 2 mg via INTRAVENOUS
  Filled 2024-12-16: qty 1

## 2024-12-16 NOTE — ED Triage Notes (Signed)
 Patient here with abdominal pain for the past week. She has a history of chron's disease, she had a bowel obstruction in May, she is saying her belly button is bulging out since the surgery. She has had diarrhea. Denies nausea, vomiting, fever.

## 2024-12-16 NOTE — ED Provider Triage Note (Signed)
 Emergency Medicine Provider Triage Evaluation Note  Jessica [name] , a 28 y.o. female  was evaluated in triage.  Pt complains of abd pain. Diffused abdominal pain ongoing x 1 week.  Also felt a bulge at her umbilicus at the surgical site when she has SBO in August requiring surgery. No fever, chills, n/v/d, dysuria  Review of Systems  Positive: As above Negative: As above  Physical Exam  BP (!) 144/123 (BP Location: Right Arm)   Pulse 89   Temp 98 F (36.7 C) (Oral)   Resp 18   Ht 5' 6 (1.676 m)   Wt 87.1 kg   SpO2 100%   BMI 30.99 kg/m  Gen:   Awake, no distress   Resp:  Normal effort  MSK:   Moves extremities without difficulty  Other:    Medical Decision Making  Medically screening exam initiated at 9:34 PM.  Appropriate orders placed.  Terryn S Killgore was informed that the remainder of the evaluation will be completed by another provider, this initial triage assessment does not replace that evaluation, and the importance of remaining in the ED until their evaluation is complete.     Nivia Colon, PA-C 12/16/24 [id number]

## 2024-12-16 NOTE — ED Notes (Signed)
 PA advised RN that patient can wait in waiting area.

## 2024-12-17 ENCOUNTER — Emergency Department (HOSPITAL_COMMUNITY)

## 2024-12-17 ENCOUNTER — Inpatient Hospital Stay (HOSPITAL_COMMUNITY)

## 2024-12-17 DIAGNOSIS — K5730 Diverticulosis of large intestine without perforation or abscess without bleeding: Secondary | ICD-10-CM

## 2024-12-17 DIAGNOSIS — M810 Age-related osteoporosis without current pathological fracture: Secondary | ICD-10-CM | POA: Diagnosis present

## 2024-12-17 DIAGNOSIS — R1084 Generalized abdominal pain: Secondary | ICD-10-CM

## 2024-12-17 DIAGNOSIS — Z833 Family history of diabetes mellitus: Secondary | ICD-10-CM | POA: Diagnosis not present

## 2024-12-17 DIAGNOSIS — R109 Unspecified abdominal pain: Secondary | ICD-10-CM | POA: Diagnosis present

## 2024-12-17 DIAGNOSIS — K5080 Crohn's disease of both small and large intestine without complications: Secondary | ICD-10-CM

## 2024-12-17 DIAGNOSIS — Z432 Encounter for attention to ileostomy: Secondary | ICD-10-CM | POA: Diagnosis not present

## 2024-12-17 DIAGNOSIS — E876 Hypokalemia: Secondary | ICD-10-CM | POA: Diagnosis present

## 2024-12-17 DIAGNOSIS — R030 Elevated blood-pressure reading, without diagnosis of hypertension: Secondary | ICD-10-CM | POA: Diagnosis not present

## 2024-12-17 DIAGNOSIS — Z8249 Family history of ischemic heart disease and other diseases of the circulatory system: Secondary | ICD-10-CM | POA: Diagnosis not present

## 2024-12-17 DIAGNOSIS — D509 Iron deficiency anemia, unspecified: Secondary | ICD-10-CM | POA: Diagnosis present

## 2024-12-17 DIAGNOSIS — K429 Umbilical hernia without obstruction or gangrene: Secondary | ICD-10-CM | POA: Diagnosis present

## 2024-12-17 DIAGNOSIS — K565 Intestinal adhesions [bands], unspecified as to partial versus complete obstruction: Secondary | ICD-10-CM | POA: Diagnosis present

## 2024-12-17 DIAGNOSIS — K56609 Unspecified intestinal obstruction, unspecified as to partial versus complete obstruction: Secondary | ICD-10-CM | POA: Diagnosis present

## 2024-12-17 DIAGNOSIS — I10 Essential (primary) hypertension: Secondary | ICD-10-CM | POA: Diagnosis present

## 2024-12-17 DIAGNOSIS — K50812 Crohn's disease of both small and large intestine with intestinal obstruction: Secondary | ICD-10-CM | POA: Diagnosis present

## 2024-12-17 DIAGNOSIS — F17210 Nicotine dependence, cigarettes, uncomplicated: Secondary | ICD-10-CM | POA: Diagnosis present

## 2024-12-17 LAB — COMPREHENSIVE METABOLIC PANEL WITH GFR
ALT: 19 U/L (ref 0–44)
AST: 19 U/L (ref 15–41)
Albumin: 4.3 g/dL (ref 3.5–5.0)
Alkaline Phosphatase: 88 U/L (ref 38–126)
Anion gap: 9 (ref 5–15)
BUN: 7 mg/dL (ref 6–20)
CO2: 26 mmol/L (ref 22–32)
Calcium: 9.2 mg/dL (ref 8.9–10.3)
Chloride: 102 mmol/L (ref 98–111)
Creatinine, Ser: 0.65 mg/dL (ref 0.44–1.00)
GFR, Estimated: >60 mL/min
Glucose, Bld: 94 mg/dL (ref 70–99)
Potassium: 3.8 mmol/L (ref 3.5–5.1)
Sodium: 137 mmol/L (ref 135–145)
Total Bilirubin: 0.5 mg/dL (ref 0.0–1.2)
Total Protein: 8 g/dL (ref 6.5–8.1)

## 2024-12-17 LAB — I-STAT CG4 LACTIC ACID, ED: Lactic Acid, Venous: 1.9 mmol/L (ref 0.5–1.9)

## 2024-12-17 LAB — LACTIC ACID, PLASMA: Lactic Acid, Venous: 0.5 mmol/L (ref 0.5–1.9)

## 2024-12-17 LAB — LIPASE, BLOOD: Lipase: 16 U/L (ref 11–51)

## 2024-12-17 MED ORDER — SODIUM CHLORIDE 0.9% FLUSH
3.0000 mL | Freq: Two times a day (BID) | INTRAVENOUS | Status: DC
  Administered 2024-12-17 – 2024-12-19 (×2): 3 mL via INTRAVENOUS

## 2024-12-17 MED ORDER — PANTOPRAZOLE SODIUM 40 MG IV SOLR
40.0000 mg | INTRAVENOUS | Status: DC
  Administered 2024-12-17 – 2024-12-19 (×3): 40 mg via INTRAVENOUS
  Filled 2024-12-17 (×3): qty 10

## 2024-12-17 MED ORDER — LORAZEPAM 2 MG/ML IJ SOLN
0.5000 mg | Freq: Once | INTRAMUSCULAR | Status: AC
  Administered 2024-12-17: 0.5 mg via INTRAVENOUS
  Filled 2024-12-17: qty 1

## 2024-12-17 MED ORDER — ONDANSETRON HCL 4 MG/2ML IJ SOLN
4.0000 mg | Freq: Once | INTRAMUSCULAR | Status: AC
  Administered 2024-12-17: 4 mg via INTRAVENOUS
  Filled 2024-12-17: qty 2

## 2024-12-17 MED ORDER — DIATRIZOATE MEGLUMINE & SODIUM 66-10 % PO SOLN
90.0000 mL | Freq: Once | ORAL | Status: AC
  Administered 2024-12-17: 90 mL via NASOGASTRIC
  Filled 2024-12-17: qty 90

## 2024-12-17 MED ORDER — IOHEXOL 350 MG/ML SOLN
75.0000 mL | Freq: Once | INTRAVENOUS | Status: AC | PRN
  Administered 2024-12-17: 75 mL via INTRAVENOUS

## 2024-12-17 MED ORDER — ACETAMINOPHEN 650 MG RE SUPP: 650.0000 mg | Freq: Four times a day (QID) | RECTAL | Status: DC | PRN

## 2024-12-17 MED ORDER — SODIUM CHLORIDE 0.9 % IV SOLN: INTRAVENOUS | Status: AC

## 2024-12-17 MED ORDER — MORPHINE SULFATE (PF) 2 MG/ML IV SOLN
2.0000 mg | INTRAVENOUS | Status: DC | PRN
  Administered 2024-12-17: 2 mg via INTRAVENOUS
  Filled 2024-12-17: qty 1

## 2024-12-17 MED ORDER — ONDANSETRON HCL 4 MG/2ML IJ SOLN
4.0000 mg | Freq: Four times a day (QID) | INTRAMUSCULAR | Status: DC | PRN
  Administered 2024-12-17 – 2024-12-19 (×4): 4 mg via INTRAVENOUS
  Filled 2024-12-17 (×4): qty 2

## 2024-12-17 MED ORDER — HYDROMORPHONE HCL 1 MG/ML IJ SOLN
1.0000 mg | Freq: Once | INTRAMUSCULAR | Status: AC
  Administered 2024-12-17: 1 mg via INTRAVENOUS
  Filled 2024-12-17: qty 1

## 2024-12-17 MED ORDER — ACETAMINOPHEN 325 MG PO TABS: 650.0000 mg | ORAL_TABLET | Freq: Four times a day (QID) | ORAL | Status: DC | PRN

## 2024-12-17 MED ORDER — HYDROMORPHONE HCL 1 MG/ML IJ SOLN
0.5000 mg | Freq: Once | INTRAMUSCULAR | Status: AC
  Administered 2024-12-17: 0.5 mg via INTRAVENOUS
  Filled 2024-12-17: qty 1

## 2024-12-17 MED ORDER — PANTOPRAZOLE SODIUM 40 MG IV SOLR
40.0000 mg | Freq: Two times a day (BID) | INTRAVENOUS | Status: DC
  Administered 2024-12-17: 40 mg via INTRAVENOUS
  Filled 2024-12-17: qty 10

## 2024-12-17 MED ORDER — HYDROMORPHONE HCL 1 MG/ML IJ SOLN
0.5000 mg | INTRAMUSCULAR | Status: AC | PRN
  Administered 2024-12-17: 1 mg via INTRAVENOUS
  Filled 2024-12-17: qty 1

## 2024-12-17 MED ORDER — HYDROMORPHONE HCL 1 MG/ML IJ SOLN
0.5000 mg | INTRAMUSCULAR | Status: DC | PRN
  Administered 2024-12-17: 0.5 mg via INTRAVENOUS
  Filled 2024-12-17: qty 1

## 2024-12-17 MED ORDER — HEPARIN SODIUM (PORCINE) 5000 UNIT/ML IJ SOLN
5000.0000 [IU] | Freq: Two times a day (BID) | INTRAMUSCULAR | Status: DC
  Administered 2024-12-17: 5000 [IU] via SUBCUTANEOUS
  Filled 2024-12-17: qty 1

## 2024-12-17 MED ORDER — HYDROMORPHONE HCL 1 MG/ML IJ SOLN
0.5000 mg | Freq: Once | INTRAMUSCULAR | Status: AC
  Administered 2024-12-17: 0.5 mg via INTRAVENOUS
  Filled 2024-12-17: qty 0.5

## 2024-12-17 MED ORDER — HYDROMORPHONE HCL 1 MG/ML IJ SOLN
0.5000 mg | INTRAMUSCULAR | Status: DC | PRN
  Administered 2024-12-17 (×3): 0.5 mg via INTRAVENOUS
  Filled 2024-12-17 (×3): qty 0.5

## 2024-12-17 NOTE — Consult Note (Signed)
 Reason for Consult:ab pain Referring Physician: Dr Yolande Wendi GORMAN Jessica [name] is an 28 y.o. female.  HPI: 12 yof with history of Crohns disease who was operated on in 5/25 for SBO.  This was open LOA (she has had prior sbr). Op note reports this was adhesive in nature and not active Crohns.  She remains on entyvio  for which she is due a dose soon.  She had been doing well. She sees Dr Leigh of [facility].  She comes in with a couple days of n/v, decreased po intake and no bowel function. Also reports that she has had UH come up in recent past as well. She has no tachycardia, nl Cr, WBC 14.9. she has undergone CT scan with evidence of prior sbr and distal small bowel  obstruction with some stranding.  There is small UH with fat present. She is comfortable when I see her.  She does not have ng or fluids.   Past Medical History:  Diagnosis Date   Anemia    Crohn's disease (HCC)    Osteoporosis    SBO (small bowel obstruction) (HCC)    Supervision of normal first pregnancy 05/03/2013   Uveitis    Vitamin D  deficiency     Past Surgical History:  Procedure Laterality Date   COLONOSCOPY N/A 01/22/2015   Procedure: COLONOSCOPY;  Surgeon: Princella CHRISTELLA Nida, MD;  Location: [facility];  Service: Endoscopy;  Laterality: N/A;   DILATION AND EVACUATION N/A 10/31/2014   Procedure: DILATATION AND EVACUATION;  Surgeon: Olam Mill, MD;  Location: [facility];  Service: Gynecology;  Laterality: N/A;   ESOPHAGOGASTRODUODENOSCOPY N/A 01/22/2015   Procedure: ESOPHAGOGASTRODUODENOSCOPY (EGD);  Surgeon: Princella CHRISTELLA Nida, MD;  Location: [facility];  Service: Endoscopy;  Laterality: N/A;   FLEXIBLE SIGMOIDOSCOPY N/A 11/04/2014   Procedure: FLEXIBLE SIGMOIDOSCOPY;  Surgeon: Princella CHRISTELLA Nida, MD;  Location: [facility];  Service: Endoscopy;  Laterality: N/A;   ILEOSTOMY CLOSURE N/A 12/18/2014   Procedure: LOOP ILEOSTOMY REVERSAL;  Surgeon: Bernarda Ned, MD;  Location: [facility];  Service: General;  Laterality: N/A;    INCISION AND DRAINAGE Left    arm   LAPAROSCOPIC TUBAL LIGATION Bilateral 02/19/2020   Procedure: LAPAROSCOPIC TUBAL LIGATION;  Surgeon: Eveline Lynwood MATSU, MD;  Location: [facility];  Service: Gynecology;  Laterality: Bilateral;   LAPAROTOMY N/A 04/26/2024   Procedure: LAPAROTOMY, EXPLORATORY, LYSIS OF ADHESIONS;  Surgeon: Kinsinger, Herlene Righter, MD;  Location: [facility];  Service: General;  Laterality: N/A;   SMALL INTESTINE SURGERY     WISDOM TOOTH EXTRACTION      Family History  Problem Relation Age of Onset   Diabetes Father    Diabetes Maternal Grandmother    Heart disease Maternal Grandmother    Hypertension Maternal Grandmother    Kidney disease Maternal Grandmother    Lung cancer Maternal Grandfather    Hyperlipidemia Maternal Grandfather    Stroke Neg Hx    Colon cancer Neg Hx    Colon polyps Neg Hx    Esophageal cancer Neg Hx    Rectal cancer Neg Hx    Stomach cancer Neg Hx     Social History:  reports that she has never smoked. She has never used smokeless tobacco. She reports that she does not drink alcohol and does not use drugs.  Allergies: Allergies[1]  Medications: I have reviewed the patient's current medications.  Results for orders placed or performed during the hospital encounter of 12/16/24 (from the past 48 hours)  Comprehensive metabolic panel with  GFR     Status: None   Collection Time: 12/16/24 12:11 AM  Result Value Ref Range   Sodium 137 135 - 145 mmol/L   Potassium 3.8 3.5 - 5.1 mmol/L   Chloride 102 98 - 111 mmol/L   CO2 26 22 - 32 mmol/L   Glucose, Bld 94 70 - 99 mg/dL    Comment: Glucose reference range applies only to samples taken after fasting for at least 8 hours.   BUN 7 6 - 20 mg/dL   Creatinine, Ser 9.34 0.44 - 1.00 mg/dL   Calcium  9.2 8.9 - 10.3 mg/dL   Total Protein 8.0 6.5 - 8.1 g/dL   Albumin 4.3 3.5 - 5.0 g/dL   AST 19 15 - 41 U/L   ALT 19 0 - 44 U/L   Alkaline Phosphatase 88 38 - 126 U/L   Total Bilirubin 0.5 0.0 - 1.2  mg/dL   GFR, Estimated >39 >39 mL/min    Comment: (NOTE) Calculated using the CKD-EPI Creatinine Equation (2021)    Anion gap 9 5 - 15    Comment: Performed at [facility], [address], [place], [place] [address]  Lipase, blood     Status: None   Collection Time: 12/16/24 12:11 AM  Result Value Ref Range   Lipase 16 11 - 51 U/L    Comment: Performed at [facility], [address], [place], [place] [address]  CBC     Status: Abnormal   Collection Time: 12/16/24  9:36 PM  Result Value Ref Range   WBC 14.9 (H) 4.0 - 10.5 K/uL   RBC 4.85 3.87 - 5.11 MIL/uL   Hemoglobin 14.3 12.0 - 15.0 g/dL   HCT 56.1 63.9 - 53.9 %   MCV 90.3 80.0 - 100.0 fL   MCH 29.5 26.0 - 34.0 pg   MCHC 32.6 30.0 - 36.0 g/dL   RDW 87.2 88.4 - 84.4 %   Platelets 397 150 - 400 K/uL   nRBC 0.0 0.0 - 0.2 %    Comment: Performed at [facility], [address] [place], [place] [address]  hCG, serum, qualitative     Status: None   Collection Time: 12/16/24  9:36 PM  Result Value Ref Range   Preg, Serum NEGATIVE NEGATIVE    Comment:        THE SENSITIVITY OF THIS METHODOLOGY IS >10 mIU/mL. Performed at [facility], [address], [place], [place] [address]     CT ABDOMEN PELVIS W CONTRAST Result Date: 12/17/2024 EXAM: CT ABDOMEN AND PELVIS WITH CONTRAST 12/17/2024 01:43:16 AM TECHNIQUE: CT of the abdomen and pelvis was performed with the administration of 75 mL of iohexol  (OMNIPAQUE ) 350 MG/ML injection. Multiplanar reformatted images are provided for review. Automated exposure control, iterative reconstruction, and/or weight-based adjustment of the mA/kV was utilized to reduce the radiation dose to as low as reasonably achievable. COMPARISON: 04/26/2024 CLINICAL HISTORY: Crohn's exacerbation FINDINGS: LOWER CHEST: No acute abnormality. LIVER: The liver is unremarkable. GALLBLADDER AND BILE DUCTS: Gallbladder is unremarkable. No biliary ductal dilatation. SPLEEN: No acute abnormality.  PANCREAS: No acute abnormality. ADRENAL GLANDS: No acute abnormality. KIDNEYS, URETERS AND BLADDER: No stones in the kidneys or ureters. No hydronephrosis. No perinephric or periureteral stranding. Urinary bladder is unremarkable. GI AND BOWEL: Partial small bowel resection has been performed at least twice with 2 anastomotic staple lines seen involving the distal small bowel. There is a distal bowel obstruction with 2 points of transition  best appreciated on sagittal image 92/7, likely related to a circumferential adhesion. The ileocolic anastomosis is seen within the right upper quadrant between the 2 points of obstruction. Proximal to this is the second anastomotic staple line with mild distention of the bowel in this region. Inflammatory stranding is seen involving the mesentery involved in the adhesion which encircles the 2 points of transition. There is a small umbilical hernia containing mesenteric fat which appears infiltrated, possibly reflecting incarceration of the herniated fat (49/3). Correlation with clinical examination is recommended. PERITONEUM AND RETROPERITONEUM: No ascites. No free air. VASCULATURE: Aorta is normal in caliber. LYMPH NODES: No lymphadenopathy. REPRODUCTIVE ORGANS: Bilateral tubal ligation clips are noted. BONES AND SOFT TISSUES: No acute osseous abnormality. No focal soft tissue abnormality. IMPRESSION: 1. Postsurgical changes from prior partial small bowel resections with 2 anastomoses including an ileocolic anastomosis. 2. Distal small bowel obstruction with 2 transition points, likely related to a circumferential adhesion, with associated inflammatory mesenteric stranding. 3. Small umbilical hernia containing mesenteric fat, possibly reflecting incarceration of the herniated fat; consider physical examination correlation. Electronically signed by: Dorethia Molt MD 12/17/2024 03:24 AM EST RP Workstation: [id number]    Review of Systems  Gastrointestinal:  Positive for  abdominal pain, nausea and vomiting.  All other systems reviewed and are negative.  Blood pressure (!) 157/111, pulse 79, temperature 97.7 F (36.5 C), temperature source Oral, resp. rate 14, height 5' 6 (1.676 m), weight 87.1 kg, SpO2 100%. Physical Exam Constitutional:      General: She is not in acute distress.    Appearance: She is well-developed.  Eyes:     General: No scleral icterus. Cardiovascular:     Rate and Rhythm: Normal rate.  Pulmonary:     Effort: Pulmonary effort is normal.  Abdominal:     General: There is distension.     Palpations: Abdomen is soft.     Tenderness: There is generalized abdominal tenderness (mild).     Hernia: A hernia is present. Hernia is present in the umbilical area.   Skin:    General: Skin is warm and dry.     Capillary Refill: Capillary refill takes less than 2 seconds.  Neurological:     General: No focal deficit present.     Mental Status: She is alert.  Psychiatric:        Mood and Affect: Mood normal.        Behavior: Behavior normal.     Assessment/Plan: Likely adhesive SBO -certainly has some stranding, this sounds like similar to before but with normal vitals and an exam that is not concerning don't think she needs to be urgently taken to the OR. -will give a trial of conservative therapy with NG tube, IVF and give gastrograffin later today with repeat xray -understands if clinically worsens or does not improve may need surgery  -does not appear to have active crohns  -would hold entyvio  for now- she is due a dose soon -pharm dvt prophylaxis recommended  Donnice Bury 12/17/2024, 10:02 AM         [1] No Known Allergies

## 2024-12-17 NOTE — ED Notes (Signed)
 Patient is extremely anxious about gastric tube insertion. PRN morphine  but she is still refusing insertion. Patient in bed shaking explaining she had a horrible experience with previous NG tube insertion. Tobie MD notified.

## 2024-12-17 NOTE — Consult Note (Addendum)
 "   Consultation  Referring Provider: [facility]/Patel Primary Care Physician:  Patient, No Pcp Per Primary Gastroenterologist:  Dr. Leigh  Reason for Consultation: History of Crohn's disease, small bowel obstruction  HPI: Jessica [name] is a 28 y.o. female with history of Crohn's ileocolitis initially diagnosed in 2012.  She required ileal resection with diverting ileostomy at age 11, had ileostomy takedown in 2016.  She was initially managed with Humira  and eventually developed antibodies, dose was increased and then 6-MP added.  Eventually initiated on Entyvio  November 2018.  She had follow-up colonoscopy in 2019 which was normal.  Noted to have subtherapeutic Entyvio  levels in 2023 and dosing increased to once monthly.  Her last colonoscopy was done in February 2023 for follow-up given history of prior ileal and right colon resection.  The mid ileum contained a single solitary ulcer near what appeared to be prior surgical anastomosis and the remainder of the TI was normal, ileum deeply intubated.  End-to-end ileocolonic anastomosis in the ascending colon and the remaining of the colonic mucos She has a appeared healthy, she was noted to have a single diverticulum in the ascending  colon.  She has continued on Entyvio  monthly, and has done well general.  She did have an admission in May thousand 23 with a small bowel obstruction and underwent open lysis of adhesions per Dr. Stevie. She resumed Entyvio  postoperatively Most recent fecal calprotectin was 66 as of August 2025.  Her last dose of Entyvio  was exactly 1 month ago and says she was due for infusion today.  She developed what she describes as a stomachache 3 or 4 days ago which became worse on Sunday, 12/15/2024.  At that point she was having abdominal pain and a bloated full distention type feeling.  She has not had a bowel movement in the past 2 days and has not passed any gas since 12/15/2024.  She got to the point where she was  unable to eat and felt nauseated but has not vomited.  No fever or chills. She came to the emergency room last evening  Labs showed WBC 14.9/hemoglobin 14.3 Chemistries unremarkable UA negative. Labs today pending  CT of the abdomen and pelvis showed a normal-appearing gallbladder there is evidence of partial small bowel resection x 2 with staple line in the distal small bowel.  Also evidence of distal obstruction with 2 transition points likely related to a circumferential adhesion there is inflammatory stranding involving the mesentery involved in the adhesion which encircles both of these points of transition, also small umbilical hernia containing mesenteric fat which appears infiltrated.  She has been seen by surgery today, who recommended conservative management with small bowel decompression and Gastrografin  small bowel obstruction protocol.  Currently patient has a lot of anxiety related to NG tube placement which was unsuccessful earlier today she is agreeable to retry if she can have something for sedation.    Past Medical History:  Diagnosis Date   Anemia    Crohn's disease (HCC)    Osteoporosis    SBO (small bowel obstruction) (HCC)    Supervision of normal first pregnancy 05/03/2013   Uveitis    Vitamin D  deficiency     Past Surgical History:  Procedure Laterality Date   COLONOSCOPY N/A 01/22/2015   Procedure: COLONOSCOPY;  Surgeon: Princella CHRISTELLA Nida, MD;  Location: [facility];  Service: Endoscopy;  Laterality: N/A;   DILATION AND EVACUATION N/A 10/31/2014   Procedure: DILATATION AND EVACUATION;  Surgeon: Olam Mill, MD;  Location:  [facility];  Service: Gynecology;  Laterality: N/A;   ESOPHAGOGASTRODUODENOSCOPY N/A 01/22/2015   Procedure: ESOPHAGOGASTRODUODENOSCOPY (EGD);  Surgeon: Princella CHRISTELLA Nida, MD;  Location: [facility];  Service: Endoscopy;  Laterality: N/A;   FLEXIBLE SIGMOIDOSCOPY N/A 11/04/2014   Procedure: FLEXIBLE SIGMOIDOSCOPY;  Surgeon: Princella CHRISTELLA Nida, MD;   Location: [facility];  Service: Endoscopy;  Laterality: N/A;   ILEOSTOMY CLOSURE N/A 12/18/2014   Procedure: LOOP ILEOSTOMY REVERSAL;  Surgeon: Bernarda Ned, MD;  Location: [facility];  Service: General;  Laterality: N/A;   INCISION AND DRAINAGE Left    arm   LAPAROSCOPIC TUBAL LIGATION Bilateral 02/19/2020   Procedure: LAPAROSCOPIC TUBAL LIGATION;  Surgeon: Eveline Lynwood MATSU, MD;  Location: [facility];  Service: Gynecology;  Laterality: Bilateral;   LAPAROTOMY N/A 04/26/2024   Procedure: LAPAROTOMY, EXPLORATORY, LYSIS OF ADHESIONS;  Surgeon: Kinsinger, Herlene Righter, MD;  Location: [facility];  Service: General;  Laterality: N/A;   SMALL INTESTINE SURGERY     WISDOM TOOTH EXTRACTION      Prior to Admission medications  Medication Sig Start Date End Date Taking? Authorizing Provider  ferrous sulfate  325 (65 FE) MG EC tablet Take 1 tablet (325 mg total) by mouth daily with breakfast. 02/03/22  Yes Armbruster, Elspeth SQUIBB, MD  folic acid  (FOLVITE ) 1 MG tablet Take 1 tablet (1 mg total) by mouth daily. 06/25/24  Yes [name], PA-C  sodium chloride  0.9 % SOLN 250 mL with vedolizumab  300 MG SOLR 300 mg Inject 300 mg into the vein every 28 (twenty-eight) days. Entyvio  every 4 weeks at [facility] infusion center. 02/08/22  Yes Armbruster, Elspeth SQUIBB, MD  Vitamin D , Ergocalciferol , (DRISDOL ) 1.25 MG (50000 UNIT) CAPS capsule Take 1 capsule (50,000 Units total) by mouth every 7 (seven) days. 06/25/24  Yes [name], PA-C  acetaminophen  (TYLENOL ) 500 MG tablet Take 2 tablets (1,000 mg total) by mouth every 6 (six) hours as needed. Patient not taking: Reported on 12/17/2024 05/08/24   [name], PA-C  bisacodyl  (DULCOLAX) 10 MG suppository Place 1 suppository (10 mg total) rectally daily. Patient not taking: Reported on 12/17/2024 05/10/24   Samtani, [name], MD  budesonide  (ENTOCORT EC ) 3 MG 24 hr capsule Take 3 capsules (9 mg total) by mouth daily. Office visit for further refills Patient not  taking: No sig reported 05/20/24   Armbruster, Elspeth SQUIBB, MD  fluconazole  (DIFLUCAN ) 150 MG tablet Take 1 tablet if you experience any thrush or yeast infection Patient not taking: Reported on 12/17/2024 05/09/24   Samtani, [name], MD  lidocaine  (XYLOCAINE ) 2 % solution Use as directed 15 mLs in the mouth or throat as needed for mouth pain. Patient not taking: Reported on 04/26/2024 03/10/24   Roemhildt, Lorin T, PA-C  methocarbamol  1000 MG TABS Take 1,000 mg by mouth every 6 (six) hours as needed for muscle spasms. Patient not taking: Reported on 12/17/2024 05/08/24   [name], PA-C  ondansetron  (ZOFRAN -ODT) 4 MG disintegrating tablet Take 1 tablet (4 mg total) by mouth every 8 (eight) hours as needed for nausea or vomiting. Patient not taking: Reported on 04/26/2024 02/21/23   Ladora Congress, PA  oxyCODONE  10 MG TABS Take 1 tablet (10 mg total) by mouth every 6 (six) hours as needed for severe pain (pain score 7-10) (not releived by tylenol  or robaxin ). Patient not taking: Reported on 12/17/2024 05/08/24   Simaan, Elizabeth S, PA-C    Current Facility-Administered Medications  Medication Dose Route Frequency Provider Last Rate Last Admin   0.9 %  sodium chloride  infusion   Intravenous Continuous Ebbie Cough, MD 100 mL/hr at 12/17/24 1111 New Bag at 12/17/24 1111   0.9 %  sodium chloride  infusion   Intravenous Continuous Esterwood, Amy S, PA-C   Stopped at 12/17/24 1110   acetaminophen  (TYLENOL ) tablet 650 mg  650 mg Oral Q6H PRN Tobie Mario GAILS, MD       Or   acetaminophen  (TYLENOL ) suppository 650 mg  650 mg Rectal Q6H PRN Tobie Mario GAILS, MD       diatrizoate  meglumine -sodium (GASTROGRAFIN ) 66-10 % solution 90 mL  90 mL Per NG tube Once Ebbie Cough, MD       heparin  injection 5,000 Units  5,000 Units Subcutaneous Q12H Tobie Mario GAILS, MD   5,000 Units at 12/17/24 1109   HYDROmorphone  (DILAUDID ) injection 0.5 mg  0.5 mg Intravenous Q4H PRN Tobie Mario GAILS, MD   0.5 mg at 12/17/24 1333    LORazepam  (ATIVAN ) injection 0.5 mg  0.5 mg Intravenous Once Esterwood, Amy S, PA-C       ondansetron  (ZOFRAN ) injection 4 mg  4 mg Intravenous Q6H PRN Esterwood, Amy S, PA-C       pantoprazole  (PROTONIX ) injection 40 mg  40 mg Intravenous Q12H Tobie Mario V, MD   40 mg at 12/17/24 1111   sodium chloride  flush (NS) 0.9 % injection 3 mL  3 mL Intravenous Q12H Tobie Mario V, MD   3 mL at 12/17/24 1046    Allergies as of 12/16/2024   (No Known Allergies)    Family History  Problem Relation Age of Onset   Diabetes Father    Diabetes Maternal Grandmother    Heart disease Maternal Grandmother    Hypertension Maternal Grandmother    Kidney disease Maternal Grandmother    Lung cancer Maternal Grandfather    Hyperlipidemia Maternal Grandfather    Stroke Neg Hx    Colon cancer Neg Hx    Colon polyps Neg Hx    Esophageal cancer Neg Hx    Rectal cancer Neg Hx    Stomach cancer Neg Hx     Social History   Socioeconomic History   Marital status: Single    Spouse name: Not on file   Number of children: 3   Years of education: Not on file   Highest education level: Not on file  Occupational History   Occupation: homemaker  Tobacco Use   Smoking status: Never   Smokeless tobacco: Never   Tobacco comments:    occasional cigarette  Vaping Use   Vaping status: Never Used  Substance and Sexual Activity   Alcohol use: No    Alcohol/week: 0.0 standard drinks of alcohol   Drug use: No   Sexual activity: Not Currently    Partners: Male    Birth control/protection: Surgical    Comment: tubal lig  Other Topics Concern   Not on file  Social History Narrative   Not on file   Social Drivers of Health   Tobacco Use: Low Risk (12/16/2024)   Patient History    Smoking Tobacco Use: Never    Smokeless Tobacco Use: Never    Passive Exposure: Not on file  Financial Resource Strain: Not on file  Food Insecurity: No Food Insecurity (04/28/2024)   Hunger Vital Sign    Worried About Running  Out of Food in the Last Year: Never true    Ran Out of Food in the Last Year: Never true  Transportation Needs: No Transportation Needs (04/28/2024)  PRAPARE - Administrator, Civil Service (Medical): No    Lack of Transportation (Non-Medical): No  Physical Activity: Not on file  Stress: Not on file  Social Connections: Not on file  Intimate Partner Violence: Not At Risk (04/28/2024)   Humiliation, Afraid, Rape, and Kick questionnaire    Fear of Current or Ex-Partner: No    Emotionally Abused: No    Physically Abused: No    Sexually Abused: No  Depression (PHQ2-9): Not on file  Alcohol Screen: Not on file  Housing: Low Risk (04/28/2024)   Housing Stability Vital Sign    Unable to Pay for Housing in the Last Year: No    Number of Times Moved in the Last Year: 0    Homeless in the Last Year: No  Utilities: Not At Risk (04/28/2024)   [employer] Utilities    Threatened with loss of utilities: No  Health Literacy: Not on file    Review of Systems: Pertinent positive and negative review of systems were noted in the above HPI section.  All other review of systems was otherwise negative.   Physical Exam: Vital signs in last 24 hours: Temp:  [97.7 F (36.5 C)-98 F (36.7 C)] 97.9 F (36.6 C) (12/30 1414) Pulse Rate:  [71-89] 81 (12/30 1414) Resp:  [14-19] 18 (12/30 1414) BP: (144-174)/(96-124) 156/102 (12/30 1414) SpO2:  [99 %-100 %] 100 % (12/30 1414) Weight:  [87.1 kg] 87.1 kg (12/29 1833)   General:   Alert,  Well-developed, well-nourished young African-American female, pleasant and cooperative in NAD, boyfriend at bedside Head:  Normocephalic and atraumatic. Eyes:  Sclera clear, no icterus.   Conjunctiva pink. Ears:  Normal auditory acuity. Nose:  No deformity, discharge,  or lesions. Mouth:  No deformity or lesions.   Neck:  Supple; no masses or thyromegaly. Lungs:  Clear throughout to auscultation.   No wheezes, crackles, or rhonchi.  Heart:  Regular rate and rhythm;  no murmurs, clicks, rubs,  or gallops. Abdomen: Somewhat distended, bowel sounds are present, she is tender in the right mid and mid abdomen, no rebound, midline incisional scar and previous ileostomy scar present Rectal: Not done Msk:  Symmetrical without gross deformities. . Pulses:  Normal pulses noted. Extremities:  Without clubbing or edema. Neurologic:  Alert and  oriented x4;  grossly normal neurologically. Skin:  Intact without significant lesions or rashes.. Psych:  Alert and cooperative. Normal mood and affect.  Intake/Output from previous day: No intake/output data recorded. Intake/Output this shift: No intake/output data recorded.  Lab Results: Recent Labs    12/16/24 2136  WBC 14.9*  HGB 14.3  HCT 43.8  PLT 397   BMET Recent Labs    12/16/24 0011  NA 137  K 3.8  CL 102  CO2 26  GLUCOSE 94  BUN 7  CREATININE 0.65  CALCIUM  9.2   LFT Recent Labs    12/16/24 0011  PROT 8.0  ALBUMIN 4.3  AST 19  ALT 19  ALKPHOS 88  BILITOT 0.5   PT/INR No results for input(s): LABPROT, INR in the last 72 hours. Hepatitis Panel No results for input(s): HEPBSAG, HCVAB, HEPAIGM, HEPBIGM in the last 72 hours.    IMPRESSION:  #67 28 year old white female with history of Crohn's ileocolitis status post remote ileostomy for obstruction which was subsequently taken down in 2016.  She has been on biologic therapy for multiple years initially with Humira  and then transition to Entyvio  in 2019.  Most recently has been on once  a month dosing with good control of disease. Last colonoscopy 2023 no evidence of active Crohn's  Patient had admission in May 2025 for small bowel obstruction and required lysis of adhesions/open.  Entyvio  resumed postoperatively and fecal calprotectin August 2025 in normal range at 66  Now with acute onset of abdominal pain distention bloating nausea and inability to have a bowel movement or passed flatus over the past 2 days. CT imaging  today again consistent with a partial small bowel obstruction in the distal small bowel with 2 transition points likely related to circumferential adhesive band.  There is some inflammatory stranding around this area of adhesions.  Suspect that the recurrent obstruction is secondary to adhesive disease rather than active Crohn's. Patient has been compliant with Entyvio , was actually due for her dose today.  #2 diverticulosis  Plan; n.p.o. except ice chips Pain control IV Zofran  4 mg every 6 hours as needed for nausea Discussed reattempt at NG tube placement patient agreeable with some sedation.  Will order Ativan  0.5 IV 15 minutes prior to placing NG, then to wall suction.  IV PPI once daily Hopefully she will open up with bowel decompression and can be managed conservatively Check CRP GI will follow with you. She will need to be rescheduled for Entyvio  infusion once obstruction resolves.   Amy EsterwoodPA-C  12/17/2024, 3:29 PM    I have taken an interval history, thoroughly reviewed the chart and examined the patient. I agree with the Advanced Practitioner's note, impression and recommendations, and have recorded additional findings, impressions and recommendations below. I performed a substantive portion of this encounter (>50% time spent), including a complete performance of the medical decision making.  I also personally reviewed the CT abdomen and pelvis images.  My additional thoughts are as follows:  Patient seen and examined and extensive chart review performed, particularly related to her IBD and prior admission for SBO earlier this year.  This current bowel obstruction appears related to recurrent adhesions in a patient whose IBD is under good control on biologic therapy.  She is due for her Entyvio  infusion today, and it can be rescheduled until after discharge.  (We will communicate with Dr. Leigh our office regarding that)  She does not need steroids at present or  any change to her IBD treatment regimen.  NG tube drainage, bowel rest, continue management with the surgical service to determine when or if surgical therapy warranted (hopefully not for this young lady).  We will return if needed. _________________  This consultation required a moderate degree of medical decision making due to the nature and complexity of the acute condition(s) being evaluated as well as the patient's medical comorbidities.  Victory LITTIE Brand III Office:[phone]  "

## 2024-12-17 NOTE — ED Notes (Signed)
 MD Ebbie notified of patient's NG tube refusal.

## 2024-12-17 NOTE — ED Provider Notes (Signed)
 " Jessica [name] EMERGENCY DEPARTMENT AT [facility] Provider Note   CSN: [id number] Arrival date & time: 12/16/24  1818     Patient presents with: Abdominal Pain   Jessica [name] is a 28 y.o. female.   Patient to ED with abdominal pain, nausea and vomiting x 2 days. No fever. No hematemesis or bloody stoold. She reports the worst pain is at the umbilicus. History of Crohn's, history of multiple bowel obstructions, last in August of this year. Urinating well. No chest pain or SOB.  The history is provided by the patient. No language interpreter was used.  Abdominal Pain      Prior to Admission medications  Medication Sig Start Date End Date Taking? Authorizing Provider  ferrous sulfate  325 (65 FE) MG EC tablet Take 1 tablet (325 mg total) by mouth daily with breakfast. 02/03/22  Yes Armbruster, Elspeth SQUIBB, MD  folic acid  (FOLVITE ) 1 MG tablet Take 1 tablet (1 mg total) by mouth daily. 06/25/24  Yes [name], PA-C  sodium chloride  0.9 % SOLN 250 mL with vedolizumab  300 MG SOLR 300 mg Inject 300 mg into the vein every 28 (twenty-eight) days. Entyvio  every 4 weeks at [facility] infusion center. 02/08/22  Yes Armbruster, Elspeth SQUIBB, MD  Vitamin D , Ergocalciferol , (DRISDOL ) 1.25 MG (50000 UNIT) CAPS capsule Take 1 capsule (50,000 Units total) by mouth every 7 (seven) days. 06/25/24  Yes [name], PA-C  acetaminophen  (TYLENOL ) 500 MG tablet Take 2 tablets (1,000 mg total) by mouth every 6 (six) hours as needed. Patient not taking: Reported on 12/17/2024 05/08/24   Augustus Almarie GORMAN, PA-C  bisacodyl  (DULCOLAX) 10 MG suppository Place 1 suppository (10 mg total) rectally daily. Patient not taking: Reported on 12/17/2024 05/10/24   Samtani, [name], MD  budesonide  (ENTOCORT EC ) 3 MG 24 hr capsule Take 3 capsules (9 mg total) by mouth daily. Office visit for further refills Patient not taking: No sig reported 05/20/24   Armbruster, Elspeth SQUIBB, MD  fluconazole  (DIFLUCAN ) 150 MG tablet Take 1  tablet if you experience any thrush or yeast infection Patient not taking: Reported on 12/17/2024 05/09/24   Samtani, [name], MD  lidocaine  (XYLOCAINE ) 2 % solution Use as directed 15 mLs in the mouth or throat as needed for mouth pain. Patient not taking: Reported on 04/26/2024 03/10/24   Roemhildt, Lorin T, PA-C  methocarbamol  1000 MG TABS Take 1,000 mg by mouth every 6 (six) hours as needed for muscle spasms. Patient not taking: Reported on 12/17/2024 05/08/24   Augustus Almarie GORMAN, PA-C  ondansetron  (ZOFRAN -ODT) 4 MG disintegrating tablet Take 1 tablet (4 mg total) by mouth every 8 (eight) hours as needed for nausea or vomiting. Patient not taking: Reported on 04/26/2024 02/21/23   Ladora Congress, PA  oxyCODONE  10 MG TABS Take 1 tablet (10 mg total) by mouth every 6 (six) hours as needed for severe pain (pain score 7-10) (not releived by tylenol  or robaxin ). Patient not taking: Reported on 12/17/2024 05/08/24   Simaan, Elizabeth S, PA-C    Allergies: Patient has no known allergies.    Review of Systems  Gastrointestinal:  Positive for abdominal pain.    Updated Vital Signs BP (!) 157/111 (BP Location: Right Arm)   Pulse 79   Temp 97.7 F (36.5 C) (Oral)   Resp 14   Ht 5' 6 (1.676 m)   Wt 87.1 kg   SpO2 100%   BMI 30.99 kg/m   Physical Exam Vitals and nursing note reviewed.  Constitutional:  Appearance: She is well-developed.  HENT:     Head: Normocephalic.  Cardiovascular:     Rate and Rhythm: Normal rate and regular rhythm.     Heart sounds: No murmur heard. Pulmonary:     Effort: Pulmonary effort is normal.     Breath sounds: Normal breath sounds. No wheezing, rhonchi or rales.  Abdominal:     General: Bowel sounds are absent. There is no distension.     Palpations: Abdomen is soft.     Tenderness: There is generalized abdominal tenderness. There is no guarding.  Musculoskeletal:        General: Normal range of motion.     Cervical back: Normal range of motion and neck  supple.  Skin:    General: Skin is warm and dry.  Neurological:     General: No focal deficit present.     Mental Status: She is alert and oriented to person, place, and time.     (all labs ordered are listed, but only abnormal results are displayed) Labs Reviewed  CBC - Abnormal; Notable for the following components:      Result Value   WBC 14.9 (*)    All other components within normal limits  HCG, SERUM, QUALITATIVE  COMPREHENSIVE METABOLIC PANEL WITH GFR  LIPASE, BLOOD  URINALYSIS, ROUTINE W REFLEX MICROSCOPIC  I-STAT CG4 LACTIC ACID, ED   Results for orders placed or performed during the hospital encounter of 12/16/24  Comprehensive metabolic panel with GFR   Collection Time: 12/16/24 12:11 AM  Result Value Ref Range   Sodium 137 135 - 145 mmol/L   Potassium 3.8 3.5 - 5.1 mmol/L   Chloride 102 98 - 111 mmol/L   CO2 26 22 - 32 mmol/L   Glucose, Bld 94 70 - 99 mg/dL   BUN 7 6 - 20 mg/dL   Creatinine, Ser 9.34 0.44 - 1.00 mg/dL   Calcium  9.2 8.9 - 10.3 mg/dL   Total Protein 8.0 6.5 - 8.1 g/dL   Albumin 4.3 3.5 - 5.0 g/dL   AST 19 15 - 41 U/L   ALT 19 0 - 44 U/L   Alkaline Phosphatase 88 38 - 126 U/L   Total Bilirubin 0.5 0.0 - 1.2 mg/dL   GFR, Estimated >39 >39 mL/min   Anion gap 9 5 - 15  Lipase, blood   Collection Time: 12/16/24 12:11 AM  Result Value Ref Range   Lipase 16 11 - 51 U/L  CBC   Collection Time: 12/16/24  9:36 PM  Result Value Ref Range   WBC 14.9 (H) 4.0 - 10.5 K/uL   RBC 4.85 3.87 - 5.11 MIL/uL   Hemoglobin 14.3 12.0 - 15.0 g/dL   HCT 56.1 63.9 - 53.9 %   MCV 90.3 80.0 - 100.0 fL   MCH 29.5 26.0 - 34.0 pg   MCHC 32.6 30.0 - 36.0 g/dL   RDW 87.2 88.4 - 84.4 %   Platelets 397 150 - 400 K/uL   nRBC 0.0 0.0 - 0.2 %  hCG, serum, qualitative   Collection Time: 12/16/24  9:36 PM  Result Value Ref Range   Preg, Serum NEGATIVE NEGATIVE    EKG: None  Radiology: CT ABDOMEN PELVIS W CONTRAST Result Date: 12/17/2024 EXAM: CT ABDOMEN AND  PELVIS WITH CONTRAST 12/17/2024 01:43:16 AM TECHNIQUE: CT of the abdomen and pelvis was performed with the administration of 75 mL of iohexol  (OMNIPAQUE ) 350 MG/ML injection. Multiplanar reformatted images are provided for review. Automated exposure control, iterative reconstruction, and/or  weight-based adjustment of the mA/kV was utilized to reduce the radiation dose to as low as reasonably achievable. COMPARISON: 04/26/2024 CLINICAL HISTORY: Crohn's exacerbation FINDINGS: LOWER CHEST: No acute abnormality. LIVER: The liver is unremarkable. GALLBLADDER AND BILE DUCTS: Gallbladder is unremarkable. No biliary ductal dilatation. SPLEEN: No acute abnormality. PANCREAS: No acute abnormality. ADRENAL GLANDS: No acute abnormality. KIDNEYS, URETERS AND BLADDER: No stones in the kidneys or ureters. No hydronephrosis. No perinephric or periureteral stranding. Urinary bladder is unremarkable. GI AND BOWEL: Partial small bowel resection has been performed at least twice with 2 anastomotic staple lines seen involving the distal small bowel. There is a distal bowel obstruction with 2 points of transition best appreciated on sagittal image 92/7, likely related to a circumferential adhesion. The ileocolic anastomosis is seen within the right upper quadrant between the 2 points of obstruction. Proximal to this is the second anastomotic staple line with mild distention of the bowel in this region. Inflammatory stranding is seen involving the mesentery involved in the adhesion which encircles the 2 points of transition. There is a small umbilical hernia containing mesenteric fat which appears infiltrated, possibly reflecting incarceration of the herniated fat (49/3). Correlation with clinical examination is recommended. PERITONEUM AND RETROPERITONEUM: No ascites. No free air. VASCULATURE: Aorta is normal in caliber. LYMPH NODES: No lymphadenopathy. REPRODUCTIVE ORGANS: Bilateral tubal ligation clips are noted. BONES AND SOFT TISSUES:  No acute osseous abnormality. No focal soft tissue abnormality. IMPRESSION: 1. Postsurgical changes from prior partial small bowel resections with 2 anastomoses including an ileocolic anastomosis. 2. Distal small bowel obstruction with 2 transition points, likely related to a circumferential adhesion, with associated inflammatory mesenteric stranding. 3. Small umbilical hernia containing mesenteric fat, possibly reflecting incarceration of the herniated fat; consider physical examination correlation. Electronically signed by: Dorethia Molt MD 12/17/2024 03:24 AM EST RP Workstation: [id number]     Procedures   Medications Ordered in the ED  diatrizoate  meglumine -sodium (GASTROGRAFIN ) 66-10 % solution 90 mL (has no administration in time range)  0.9 %  sodium chloride  infusion (has no administration in time range)  heparin  injection 5,000 Units (has no administration in time range)  sodium chloride  flush (NS) 0.9 % injection 3 mL (3 mLs Intravenous Given 12/17/24 1046)  0.9 %  sodium chloride  infusion (has no administration in time range)  acetaminophen  (TYLENOL ) tablet 650 mg (has no administration in time range)    Or  acetaminophen  (TYLENOL ) suppository 650 mg (has no administration in time range)  morphine  (PF) 2 MG/ML injection 2 mg (2 mg Intravenous Given 12/17/24 1044)  pantoprazole  (PROTONIX ) injection 40 mg (has no administration in time range)  morphine  (PF) 2 MG/ML injection 4 mg (4 mg Intravenous Given 12/16/24 2210)  ondansetron  (ZOFRAN ) injection 4 mg (4 mg Intravenous Given 12/16/24 2210)  morphine  (PF) 2 MG/ML injection 2 mg (2 mg Intravenous Given 12/16/24 2319)  iohexol  (OMNIPAQUE ) 350 MG/ML injection 75 mL (75 mLs Intravenous Contrast Given 12/17/24 0143)  HYDROmorphone  (DILAUDID ) injection 1 mg (1 mg Intravenous Given 12/17/24 0755)  ondansetron  (ZOFRAN ) injection 4 mg (4 mg Intravenous Given 12/17/24 0755)  HYDROmorphone  (DILAUDID ) injection 0.5 mg (0.5 mg Intravenous Given  12/17/24 0911)    Clinical Course as of 12/17/24 1059  Tue Dec 17, 2024  1018 Patient to ED with abdominal pain, N, V. History of similar symptoms with SBO in the past, most recently 04/2024 requiring surgical intervention. CT today showing recurrent SBO, 2 transition points, ? Incarcerated fat-containing umbilical hernia. Surgery consulted - advise admit to medicine, GI  consult. Discussed with Dr. Tobie, TRH, who accepts the patient for admission. GI - Amy Esterwood, PA-C to arrange GI consult.  [SU]    Clinical Course User Index [SU] Odell Balls, PA-C                                 Medical Decision Making Amount and/or Complexity of Data Reviewed Labs: ordered.  Risk Prescription drug management.        Final diagnoses:  SBO (small bowel obstruction) (HCC)  Crohn's disease with intestinal obstruction, unspecified gastrointestinal tract location [facility])    ED Discharge Orders     None          Odell Balls, PA-C 12/17/24 1059    Yolande Lamar BROCKS, MD 12/24/24 [phone]  "

## 2024-12-17 NOTE — H&P (Signed)
 " History and Physical    PatientISYS TIETJE FMW:[id number] DOB: Sep 19, 1996 DOA: 12/16/2024 DOS: the patient was seen and examined on 12/17/2024 . PCP: Patient, No Pcp Per  Patient coming from: Home Chief complaint: Chief Complaint  Patient presents with   Abdominal Pain   HPI:  Jessica [name] is a 28 y.o. female with past medical history  of Crohn's disease with history of colitis with ileal resection and a diverting ileostomy with a takedown in 2016 patient is currently managed with Entyvio  and recently dose has been changed to q. monthly patient is due for Entyvio  prior to discharge per GI note.  At bedside patient is grimacing in pain and is a limited historian does state that she has been having abdominal pain nausea for past few days did not notice any bleeding.  Patient had a history of small bowel obstruction 2 episodes in the past year.  Greatly appreciate GI and general surgery consult and management in her case.    ED Course:  Vital signs in the ED were notable for the following: Afebrile otherwise except for elevated blood pressures suspect secondary to pain. Vitals:   12/17/24 1125 12/17/24 1255 12/17/24 1330 12/17/24 1414  BP:   (!) 171/96 (!) 156/102  Pulse:   73 81  Temp: 97.9 F (36.6 C) 98 F (36.7 C)  97.9 F (36.6 C)  Resp:   18 18  Height:      Weight:      SpO2:   99% 100%  TempSrc: Oral Oral  Oral  BMI (Calculated):       >>ED evaluation thus far shows: Initial CMP is within normal limits. Lipase is within normal limits.  Lactic acid of 1.9. CBC shows a white count of 14.9 hemoglobin of 14.3 platelets 397. CT of the abdomen and pelvis with contrast shows a distal small bowel obstruction with 2 transition points likely from circumferential adhesions small umbilical hernia containing mesenteric fat possibly reflecting incarceration of the herniated fat.  >>While in the ED patient received the following: Medications  morphine  (PF) 2 MG/ML injection 4  mg (4 mg Intravenous Given 12/16/24 2210)  ondansetron  (ZOFRAN ) injection 4 mg (4 mg Intravenous Given 12/16/24 2210)  morphine  (PF) 2 MG/ML injection 2 mg (2 mg Intravenous Given 12/16/24 2319)  iohexol  (OMNIPAQUE ) 350 MG/ML injection 75 mL (75 mLs Intravenous Contrast Given 12/17/24 0143)  HYDROmorphone  (DILAUDID ) injection 1 mg (1 mg Intravenous Given 12/17/24 0755)  ondansetron  (ZOFRAN ) injection 4 mg (4 mg Intravenous Given 12/17/24 0755)  HYDROmorphone  (DILAUDID ) injection 0.5 mg (0.5 mg Intravenous Given 12/17/24 0911)   Review of Systems  Gastrointestinal:  Positive for abdominal pain, nausea and vomiting. Negative for blood in stool and diarrhea.   Past Medical History:  Diagnosis Date   Anemia    Crohn's disease (HCC)    Osteoporosis    SBO (small bowel obstruction) (HCC)    Supervision of normal first pregnancy 05/03/2013   Uveitis    Vitamin D  deficiency    Past Surgical History:  Procedure Laterality Date   COLONOSCOPY N/A 01/22/2015   Procedure: COLONOSCOPY;  Surgeon: Princella CHRISTELLA Nida, MD;  Location: [facility];  Service: Endoscopy;  Laterality: N/A;   DILATION AND EVACUATION N/A 10/31/2014   Procedure: DILATATION AND EVACUATION;  Surgeon: Olam Mill, MD;  Location: [facility];  Service: Gynecology;  Laterality: N/A;   ESOPHAGOGASTRODUODENOSCOPY N/A 01/22/2015   Procedure: ESOPHAGOGASTRODUODENOSCOPY (EGD);  Surgeon: Princella CHRISTELLA Nida, MD;  Location: [facility];  Service:  Endoscopy;  Laterality: N/A;   FLEXIBLE SIGMOIDOSCOPY N/A 11/04/2014   Procedure: FLEXIBLE SIGMOIDOSCOPY;  Surgeon: Princella CHRISTELLA Nida, MD;  Location: [facility];  Service: Endoscopy;  Laterality: N/A;   ILEOSTOMY CLOSURE N/A 12/18/2014   Procedure: LOOP ILEOSTOMY REVERSAL;  Surgeon: Bernarda Ned, MD;  Location: [facility];  Service: General;  Laterality: N/A;   INCISION AND DRAINAGE Left    arm   LAPAROSCOPIC TUBAL LIGATION Bilateral 02/19/2020   Procedure: LAPAROSCOPIC TUBAL LIGATION;  Surgeon: Eveline Lynwood MATSU, MD;  Location: [facility];  Service: Gynecology;  Laterality: Bilateral;   LAPAROTOMY N/A 04/26/2024   Procedure: LAPAROTOMY, EXPLORATORY, LYSIS OF ADHESIONS;  Surgeon: Kinsinger, Herlene Righter, MD;  Location: [facility];  Service: General;  Laterality: N/A;   SMALL INTESTINE SURGERY     WISDOM TOOTH EXTRACTION      reports that she has never smoked. She has never used smokeless tobacco. She reports that she does not drink alcohol and does not use drugs. Allergies[1] Family History  Problem Relation Age of Onset   Diabetes Father    Diabetes Maternal Grandmother    Heart disease Maternal Grandmother    Hypertension Maternal Grandmother    Kidney disease Maternal Grandmother    Lung cancer Maternal Grandfather    Hyperlipidemia Maternal Grandfather    Stroke Neg Hx    Colon cancer Neg Hx    Colon polyps Neg Hx    Esophageal cancer Neg Hx    Rectal cancer Neg Hx    Stomach cancer Neg Hx    Prior to Admission medications  Medication Sig Start Date End Date Taking? Authorizing Provider  ferrous sulfate  325 (65 FE) MG EC tablet Take 1 tablet (325 mg total) by mouth daily with breakfast. 02/03/22  Yes Armbruster, Elspeth SQUIBB, MD  folic acid  (FOLVITE ) 1 MG tablet Take 1 tablet (1 mg total) by mouth daily. 06/25/24  Yes [name], PA-C  sodium chloride  0.9 % SOLN 250 mL with vedolizumab  300 MG SOLR 300 mg Inject 300 mg into the vein every 28 (twenty-eight) days. Entyvio  every 4 weeks at [facility] infusion center. 02/08/22  Yes Armbruster, Elspeth SQUIBB, MD  Vitamin D , Ergocalciferol , (DRISDOL ) 1.25 MG (50000 UNIT) CAPS capsule Take 1 capsule (50,000 Units total) by mouth every 7 (seven) days. 06/25/24  Yes [name], PA-C  acetaminophen  (TYLENOL ) 500 MG tablet Take 2 tablets (1,000 mg total) by mouth every 6 (six) hours as needed. Patient not taking: Reported on 12/17/2024 05/08/24   [name], PA-C  bisacodyl  (DULCOLAX) 10 MG suppository Place 1 suppository (10 mg total) rectally  daily. Patient not taking: Reported on 12/17/2024 05/10/24   Samtani, [name], MD  budesonide  (ENTOCORT EC ) 3 MG 24 hr capsule Take 3 capsules (9 mg total) by mouth daily. Office visit for further refills Patient not taking: No sig reported 05/20/24   Armbruster, Elspeth SQUIBB, MD  fluconazole  (DIFLUCAN ) 150 MG tablet Take 1 tablet if you experience any thrush or yeast infection Patient not taking: Reported on 12/17/2024 05/09/24   Samtani, [name], MD  lidocaine  (XYLOCAINE ) 2 % solution Use as directed 15 mLs in the mouth or throat as needed for mouth pain. Patient not taking: Reported on 04/26/2024 03/10/24   Roemhildt, Lorin T, PA-C  methocarbamol  1000 MG TABS Take 1,000 mg by mouth every 6 (six) hours as needed for muscle spasms. Patient not taking: Reported on 12/17/2024 05/08/24   [name], PA-C  ondansetron  (ZOFRAN -ODT) 4 MG disintegrating tablet Take 1 tablet (  4 mg total) by mouth every 8 (eight) hours as needed for nausea or vomiting. Patient not taking: Reported on 04/26/2024 02/21/23   Ladora Congress, PA  oxyCODONE  10 MG TABS Take 1 tablet (10 mg total) by mouth every 6 (six) hours as needed for severe pain (pain score 7-10) (not releived by tylenol  or robaxin ). Patient not taking: Reported on 12/17/2024 05/08/24   [name], [place]                                                                                 Vitals:   12/17/24 1125 12/17/24 1255 12/17/24 1330 12/17/24 1414  BP:   (!) 171/96 (!) 156/102  Pulse:   73 81  Resp:   18 18  Temp: 97.9 F (36.6 C) 98 F (36.7 C)  97.9 F (36.6 C)  TempSrc: Oral Oral  Oral  SpO2:   99% 100%  Weight:      Height:       Physical Exam Vitals reviewed.  Constitutional:      General: She is not in acute distress.    Appearance: She is not ill-appearing.  HENT:     Head: Normocephalic.  Eyes:     Extraocular Movements: Extraocular movements intact.  Cardiovascular:     Rate and Rhythm: Normal rate and regular rhythm.      Pulses: Normal pulses.     Heart sounds: Normal heart sounds.  Pulmonary:     Effort: Pulmonary effort is normal.     Breath sounds: Normal breath sounds.  Abdominal:     General: Bowel sounds are decreased. There is no distension.     Palpations: Abdomen is soft.     Tenderness: There is generalized abdominal tenderness.  Musculoskeletal:     Right lower leg: No edema.     Left lower leg: No edema.  Neurological:     General: No focal deficit present.     Mental Status: She is alert and oriented to person, place, and time.     Labs on Admission: I have personally reviewed following labs and imaging studies CBC: Recent Labs  Lab 12/16/24 2136  WBC 14.9*  HGB 14.3  HCT 43.8  MCV 90.3  PLT 397   Basic Metabolic Panel: Recent Labs  Lab 12/16/24 0011  NA 137  K 3.8  CL 102  CO2 26  GLUCOSE 94  BUN 7  CREATININE 0.65  CALCIUM  9.2   GFR: Estimated Creatinine Clearance: 114.3 mL/min (by C-G formula based on SCr of 0.65 mg/dL). Liver Function Tests: Recent Labs  Lab 12/16/24 0011  AST 19  ALT 19  ALKPHOS 88  BILITOT 0.5  PROT 8.0  ALBUMIN 4.3   Recent Labs  Lab 12/16/24 0011  LIPASE 16   No results for input(s): AMMONIA in the last 168 hours. Recent Labs    04/30/24 0512 05/02/24 0511 05/03/24 0626 05/04/24 0512 05/05/24 0807 05/06/24 0549 05/07/24 0518 05/08/24 0452 06/24/24 1045 12/16/24 0011  BUN 6 <5* <5* <5* <5* <5* <5* <5* 8 7  CREATININE 0.64 0.64 0.54 0.60 0.55 0.56 0.60 0.61 0.60 0.65    Cardiac Enzymes: No results for input(s): CKTOTAL, CKMB, CKMBINDEX,  TROPONINI in the last 168 hours. BNP (last 3 results) No results for input(s): PROBNP in the last 8760 hours. HbA1C: No results for input(s): HGBA1C in the last 72 hours. CBG: No results for input(s): GLUCAP in the last 168 hours. Lipid Profile: No results for input(s): CHOL, HDL, LDLCALC, TRIG, CHOLHDL, LDLDIRECT in the last 72 hours. Thyroid Function  Tests: No results for input(s): TSH, T4TOTAL, FREET4, T3FREE, THYROIDAB in the last 72 hours. Anemia Panel: No results for input(s): VITAMINB12, FOLATE, FERRITIN, TIBC, IRON , RETICCTPCT in the last 72 hours. Urine analysis:    Component Value Date/Time   COLORURINE YELLOW 04/25/2024 1846   APPEARANCEUR HAZY (A) 04/25/2024 1846   LABSPEC 1.030 04/25/2024 1846   PHURINE 6.0 04/25/2024 1846   GLUCOSEU NEGATIVE 04/25/2024 1846   HGBUR NEGATIVE 04/25/2024 1846   BILIRUBINUR NEGATIVE 04/25/2024 1846   BILIRUBINUR small 09/30/2019 1338   KETONESUR 5 (A) 04/25/2024 1846   PROTEINUR 30 (A) 04/25/2024 1846   UROBILINOGEN 0.2 09/30/2019 1338   UROBILINOGEN 1.0 01/02/2015 2320   NITRITE NEGATIVE 04/25/2024 1846   LEUKOCYTESUR MODERATE (A) 04/25/2024 1846   Radiological Exams on Admission: DG Abd Portable 1V-Small Bowel Protocol-Position Verification Result Date: 12/17/2024 CLINICAL DATA:  NG placement. EXAM: PORTABLE ABDOMEN - 1 VIEW COMPARISON:  Abdominal radiograph dated 09/24/2024. FINDINGS: Enteric tube with tip in the left upper abdomen in the region of the body of the stomach. IMPRESSION: Enteric tube with tip in the body of the stomach. Electronically Signed   By: Vanetta Chou M.D.   On: 12/17/2024 19:23   CT ABDOMEN PELVIS W CONTRAST Result Date: 12/17/2024 EXAM: CT ABDOMEN AND PELVIS WITH CONTRAST 12/17/2024 01:43:16 AM TECHNIQUE: CT of the abdomen and pelvis was performed with the administration of 75 mL of iohexol  (OMNIPAQUE ) 350 MG/ML injection. Multiplanar reformatted images are provided for review. Automated exposure control, iterative reconstruction, and/or weight-based adjustment of the mA/kV was utilized to reduce the radiation dose to as low as reasonably achievable. COMPARISON: 04/26/2024 CLINICAL HISTORY: Crohn's exacerbation FINDINGS: LOWER CHEST: No acute abnormality. LIVER: The liver is unremarkable. GALLBLADDER AND BILE DUCTS: Gallbladder is  unremarkable. No biliary ductal dilatation. SPLEEN: No acute abnormality. PANCREAS: No acute abnormality. ADRENAL GLANDS: No acute abnormality. KIDNEYS, URETERS AND BLADDER: No stones in the kidneys or ureters. No hydronephrosis. No perinephric or periureteral stranding. Urinary bladder is unremarkable. GI AND BOWEL: Partial small bowel resection has been performed at least twice with 2 anastomotic staple lines seen involving the distal small bowel. There is a distal bowel obstruction with 2 points of transition best appreciated on sagittal image 92/7, likely related to a circumferential adhesion. The ileocolic anastomosis is seen within the right upper quadrant between the 2 points of obstruction. Proximal to this is the second anastomotic staple line with mild distention of the bowel in this region. Inflammatory stranding is seen involving the mesentery involved in the adhesion which encircles the 2 points of transition. There is a small umbilical hernia containing mesenteric fat which appears infiltrated, possibly reflecting incarceration of the herniated fat (49/3). Correlation with clinical examination is recommended. PERITONEUM AND RETROPERITONEUM: No ascites. No free air. VASCULATURE: Aorta is normal in caliber. LYMPH NODES: No lymphadenopathy. REPRODUCTIVE ORGANS: Bilateral tubal ligation clips are noted. BONES AND SOFT TISSUES: No acute osseous abnormality. No focal soft tissue abnormality. IMPRESSION: 1. Postsurgical changes from prior partial small bowel resections with 2 anastomoses including an ileocolic anastomosis. 2. Distal small bowel obstruction with 2 transition points, likely related to a circumferential adhesion, with  associated inflammatory mesenteric stranding. 3. Small umbilical hernia containing mesenteric fat, possibly reflecting incarceration of the herniated fat; consider physical examination correlation. Electronically signed by: Dorethia Molt MD 12/17/2024 03:24 AM EST RP Workstation:  [id number]   Data Reviewed: Relevant notes from primary care and specialist visits, past discharge summaries as available in EHR, including Care Everywhere . Prior diagnostic testing as pertinent to current admission diagnoses, Updated medications and problem lists for reconciliation .ED course, including vitals, labs, imaging, treatment and response to treatment,Triage notes, nursing and pharmacy notes and ED provider's notes.Notable results as noted in HPI.Discussed case with EDMD/ ED APP/ or Specialty MD on call and as needed.  Assessment & Plan  >>Distal Small Bowel Obstruction (SBO) in Patient with Crohn's Disease: CT demonstrates distal small bowel obstruction with 2 transition points likely from circumferential adhesions, consistent with adhesive SBO in the setting of prior ileostomy takedown (2015) and exploratory laparotomy with lysis of adhesions (May 2025). Patient has had 2 prior SBO episodes in the past year.  Initial conservative management given patient is hemodynamically stable, afebrile, without peritonitis, and has no CT findings of bowel ischemia.However will follow closely due to : Complete obstruction with 2 transition points has higher failure rate of conservative management  Leukocytosis (WBC 14.9) is a concerning sign, though patient lacks fever, tachycardia, or metabolic acidosis. NPO with nasogastric tube decompression IV fluid resuscitation with balanced crystalloid . Daily electrolyte monitoring and replacement (hypokalemia, hypomagnesemia, hypophosphatemia contribute to hypoperistalsis) Daily CBC, BMP, lactate monitoring   >>Crohn's disease considerations:  Suspect due to adhesions.  Cont entyvio  per GI.  >>Small Umbilical Hernia with Possible Fat Incarceration Clinical significance: CT shows small umbilical hernia containing mesenteric fat which appears infiltrated, possibly reflecting incarceration of the herniated fat.Pt is seen by general surg appreciate consult  and management. Continue IV hydromorphone  0.5-1 mg q3-4h PRN for severe pain Add PRN acetaminophen  1000 mg PO/IV q6h (if able to tolerate) Avoid NSAIDs given Crohn's disease Reassess pain control frequently and adjust as needed Consider PCA if pain remains difficult to control.  >> Essential Hypertension: Likely secondary to pain: BP elevated to 171/96 and 156/102, likely pain-related given patient grimacing. Continue to monitor. Once pain controlled, reassess BP. Patient not on home antihypertensives.  >> Anemia:    Latest Ref Rng & Units 12/16/2024    9:36 PM 10/28/2024    3:52 PM 06/24/2024   10:45 AM  CBC  WBC 4.0 - 10.5 K/uL 14.9  8.0  8.0   Hemoglobin 12.0 - 15.0 g/dL 85.6  86.5  86.8   Hematocrit 36.0 - 46.0 % 43.8  40.7  39.6   Platelets 150 - 400 K/uL 397  261.0  361.0   Stable will follow.    DVT prophylaxis:  Scd's, will d/c heparin  as she may need surgery. Consults:  GI. General surgery.   Advance Care Planning:    Code Status: Full Code   Family Communication:  Family at bedside.   Disposition Plan:  Home.   Severity of Illness: The appropriate patient status for this patient is INPATIENT. Inpatient status is judged to be reasonable and necessary in order to provide the required intensity of service to ensure the patient's safety. The patient's presenting symptoms, physical exam findings, and initial radiographic and laboratory data in the context of their chronic comorbidities is felt to place them at high risk for further clinical deterioration. Furthermore, it is not anticipated that the patient will be medically stable for discharge from the hospital within 2  midnights of admission.   * I certify that at the point of admission it is my clinical judgment that the patient will require inpatient hospital care spanning beyond 2 midnights from the point of admission due to high intensity of service, high risk for further deterioration and high frequency of  surveillance required.*  Unresulted Labs (From admission, onward)     Start     Ordered   12/18/24 0500  Comprehensive metabolic panel  Tomorrow morning,   R        12/17/24 1004   12/18/24 0500  CBC  Tomorrow morning,   R        12/17/24 1004   12/18/24 0500  C-reactive protein  Tomorrow morning,   R        12/17/24 1528   12/17/24 1925  Lactic acid, plasma  (Lactic Acid)  Once,   R       Question:  Release to patient  Answer:  Immediate   12/17/24 1924   12/16/24 1834  Urinalysis, Routine w reflex microscopic -Urine, Clean Catch  Once,   URGENT       Question Answer Comment  Obtain urine by in and out catheter if not obtained within 30 minutes of placing in a treatment room? Yes   Specimen Source Urine, Clean Catch      12/16/24 1833            Meds ordered this encounter  Medications   morphine  (PF) 2 MG/ML injection 4 mg   ondansetron  (ZOFRAN ) injection 4 mg   morphine  (PF) 2 MG/ML injection 2 mg   iohexol  (OMNIPAQUE ) 350 MG/ML injection 75 mL   HYDROmorphone  (DILAUDID ) injection 1 mg   ondansetron  (ZOFRAN ) injection 4 mg   HYDROmorphone  (DILAUDID ) injection 0.5 mg   diatrizoate  meglumine -sodium (GASTROGRAFIN ) 66-10 % solution 90 mL    Give gg six hours after ng placed to  suction   0.9 %  sodium chloride  infusion   DISCONTD: heparin  injection 5,000 Units   sodium chloride  flush (NS) 0.9 % injection 3 mL   0.9 %  sodium chloride  infusion   OR Linked Order Group    acetaminophen  (TYLENOL ) tablet 650 mg    acetaminophen  (TYLENOL ) suppository 650 mg   DISCONTD: morphine  (PF) 2 MG/ML injection 2 mg   DISCONTD: pantoprazole  (PROTONIX ) injection 40 mg   DISCONTD: HYDROmorphone  (DILAUDID ) injection 0.5 mg   HYDROmorphone  (DILAUDID ) injection 0.5 mg   LORazepam  (ATIVAN ) injection 0.5 mg   ondansetron  (ZOFRAN ) injection 4 mg   pantoprazole  (PROTONIX ) injection 40 mg   HYDROmorphone  (DILAUDID ) injection 0.5 mg     Orders Placed This Encounter  Procedures   CT  ABDOMEN PELVIS W CONTRAST   DG Abd Portable 1V-Small Bowel Protocol-Position Verification   DG Abd Portable 1V-Small Bowel Obstruction Protocol-initial, 8 hr delay   CBC   Urinalysis, Routine w reflex microscopic -Urine, Clean Catch   hCG, serum, qualitative   Comprehensive metabolic panel with GFR   Lipase, blood   Comprehensive metabolic panel   CBC   C-reactive protein   Lactic acid, plasma   Diet NPO time specified Except for: Ice Chips   Refer to Sidebar: Small Bowel Obstruction Protocol   Gastric tube   Elevate head of bed   Ensure patient not vomiting prior to administration of Gastrografin . Notify MD if patient is actively vomiting   Clamp NG tube for 1 hour after administration of Gastrografin  then resume NG tube to low wall intermittent suction  Maintain IV access   Vital signs   Notify physician (specify)   Mobility Protocol: No Restrictions   Refer to Sidebar Report Mobility Protocol for Adult Inpatient   Initiate Adult Central Line Maintenance and Catheter Clearance Protocol for patients with central line (CVC, PICC, Port, Hemodialysis, Trialysis)   Daily weights   Intake and Output   Initiate CHG Protocol for patients in ICU/SD or any patient with a central line or foley catheter   Do not place and if present remove PureWick   Initiate Oral Care Protocol   Initiate Carrier Fluid Protocol   RN may order General Admission PRN Orders utilizing General Admission PRN medications (through manage orders) for the following patient needs: allergy symptoms (Claritin ), cold sores (Carmex), cough (Robitussin DM), eye irritation (Liquifilm Tears), hemorrhoids (Tucks), indigestion (Maalox), minor skin irritation (Hydrocortisone  Cream), muscle pain (Ben Gay), nose irritation (saline nasal spray) and sore throat (Chloraseptic spray).   Gastric tube   Place and maintain sequential compression device   Full code   Consult to hospitalist   Pulse oximetry check with vital signs    Oxygen therapy Mode or (Route): Nasal cannula; Liters Per Minute: 2; Keep O2 saturation between: greater than 92 %   I-Stat CG4 Lactic Acid   EKG 12-Lead   EKG 12-Lead   Admit to Inpatient (patient's expected length of stay will be greater than 2 midnights or inpatient only procedure)    Author: Mario LULLA Blanch, MD 12 pm- 8 pm. [facility]. 12/17/2024 7:27 PM Please note for any communication after hours contact [facility] Assigned provider on call on Amion.       [1] No Known Allergies  "

## 2024-12-18 ENCOUNTER — Inpatient Hospital Stay (HOSPITAL_COMMUNITY)

## 2024-12-18 DIAGNOSIS — R030 Elevated blood-pressure reading, without diagnosis of hypertension: Secondary | ICD-10-CM | POA: Diagnosis not present

## 2024-12-18 DIAGNOSIS — K56609 Unspecified intestinal obstruction, unspecified as to partial versus complete obstruction: Secondary | ICD-10-CM

## 2024-12-18 LAB — CBC
HCT: 35.9 % — ABNORMAL LOW (ref 36.0–46.0)
Hemoglobin: 11.9 g/dL — ABNORMAL LOW (ref 12.0–15.0)
MCH: 29.8 pg (ref 26.0–34.0)
MCHC: 33.1 g/dL (ref 30.0–36.0)
MCV: 89.8 fL (ref 80.0–100.0)
Platelets: 290 K/uL (ref 150–400)
RBC: 4 MIL/uL (ref 3.87–5.11)
RDW: 13 % (ref 11.5–15.5)
WBC: 9.3 K/uL (ref 4.0–10.5)
nRBC: 0 % (ref 0.0–0.2)

## 2024-12-18 LAB — COMPREHENSIVE METABOLIC PANEL WITH GFR
ALT: 13 U/L (ref 0–44)
AST: 14 U/L — ABNORMAL LOW (ref 15–41)
Albumin: 3.6 g/dL (ref 3.5–5.0)
Alkaline Phosphatase: 71 U/L (ref 38–126)
Anion gap: 9 (ref 5–15)
BUN: 5 mg/dL — ABNORMAL LOW (ref 6–20)
CO2: 25 mmol/L (ref 22–32)
Calcium: 8.7 mg/dL — ABNORMAL LOW (ref 8.9–10.3)
Chloride: 104 mmol/L (ref 98–111)
Creatinine, Ser: 0.6 mg/dL (ref 0.44–1.00)
GFR, Estimated: >60 mL/min
Glucose, Bld: 82 mg/dL (ref 70–99)
Potassium: 3.8 mmol/L (ref 3.5–5.1)
Sodium: 138 mmol/L (ref 135–145)
Total Bilirubin: 0.5 mg/dL (ref 0.0–1.2)
Total Protein: 6.8 g/dL (ref 6.5–8.1)

## 2024-12-18 LAB — C-REACTIVE PROTEIN: CRP: 1.8 mg/dL — ABNORMAL HIGH

## 2024-12-18 MED ORDER — ENOXAPARIN SODIUM 40 MG/0.4ML IJ SOSY
40.0000 mg | PREFILLED_SYRINGE | INTRAMUSCULAR | Status: DC
  Administered 2024-12-18 – 2024-12-20 (×3): 40 mg via SUBCUTANEOUS
  Filled 2024-12-18 (×3): qty 0.4

## 2024-12-18 MED ORDER — HYDROMORPHONE HCL 1 MG/ML IJ SOLN
0.5000 mg | INTRAMUSCULAR | Status: DC | PRN
  Administered 2024-12-18 – 2024-12-20 (×20): 1 mg via INTRAVENOUS
  Filled 2024-12-18 (×20): qty 1

## 2024-12-18 MED ORDER — HYDRALAZINE HCL 20 MG/ML IJ SOLN: 10.0000 mg | Freq: Four times a day (QID) | INTRAMUSCULAR | Status: DC | PRN

## 2024-12-18 NOTE — Progress Notes (Signed)
 [facility] Progress Note     Subjective: CC:  Got NGT placed later yesterday, not much coming out. No nausea at present but has had some nausea meds. No vomiting around NGT. No flatus or BM. Abd pain improved. Objective: Vital signs in last 24 hours: Temp:  [97.6 F (36.4 C)-98.1 F (36.7 C)] 97.9 F (36.6 C) (12/31 0819) Pulse Rate:  [69-88] 88 (12/31 0819) Resp:  [18] 18 (12/31 0819) BP: (137-171)/(93-102) 146/101 (12/31 0819) SpO2:  [98 %-100 %] 99 % (12/31 0819) Weight:  [88 kg] 88 kg (12/31 0402) Last BM Date : 12/15/24  Intake/Output from previous day: 12/30 0701 - 12/31 0700 In: 1944.7 [I.V.:1854.7; NG/GT:90] Out: 70 [Emesis/NG output:70] Intake/Output this shift: No intake/output data recorded.  PE: Gen:  Alert, NAD, pleasant Card:  Regular rate and rhythm Pulm:  Normal effort ORA Abd: Soft, non-tender, mild distention, no guarding, previou scars appear well healing  NGT in place, it is tiny, looks like a pediatric tube. Not really draining. I flushed it and checked the connections. Skin: warm and dry, no rashes  Psych: A&Ox3   Lab Results:  Recent Labs    12/16/24 2136 12/18/24 0429  WBC 14.9* 9.3  HGB 14.3 11.9*  HCT 43.8 35.9*  PLT 397 290   BMET Recent Labs    12/16/24 0011 12/18/24 0429  NA 137 138  K 3.8 3.8  CL 102 104  CO2 26 25  GLUCOSE 94 82  BUN 7 5*  CREATININE 0.65 0.60  CALCIUM  9.2 8.7*   PT/INR No results for input(s): LABPROT, INR in the last 72 hours. CMP     Component Value Date/Time   NA 138 12/18/2024 0429   NA 138 10/16/2019 1420   K 3.8 12/18/2024 0429   CL 104 12/18/2024 0429   CO2 25 12/18/2024 0429   GLUCOSE 82 12/18/2024 0429   BUN 5 (L) 12/18/2024 0429   BUN 6 10/16/2019 1420   CREATININE 0.60 12/18/2024 0429   CALCIUM  8.7 (L) 12/18/2024 0429   PROT 6.8 12/18/2024 0429   PROT 6.8 10/16/2019 1420   ALBUMIN 3.6 12/18/2024 0429   ALBUMIN 3.7 (L) 10/16/2019 1420   AST 14 (L) 12/18/2024  0429   ALT 13 12/18/2024 0429   ALKPHOS 71 12/18/2024 0429   BILITOT 0.5 12/18/2024 0429   BILITOT 0.3 10/16/2019 1420   GFRNONAA >60 12/18/2024 0429   GFRAA >60 10/19/2019 1655   Lipase     Component Value Date/Time   LIPASE 16 12/16/2024 0011       Studies/Results: DG Abd Portable 1V-Small Bowel Obstruction Protocol-initial, 8 hr delay Result Date: 12/18/2024 CLINICAL DATA:  Small bowel obstruction EXAM: PORTABLE ABDOMEN - 1 VIEW COMPARISON:  Yesterday FINDINGS: There appears to be contrast within dilated distal small bowel loops in the right side of abdomen and pelvis. The dilated portion of contrast seen in right upper quadrant appears to be in expected position of the ileocolic anastomosis as noted on prior CT scan. No other definite contrast filling of colon is noted. IMPRESSION: Contrast is noted within dilated distal small bowel loops in the right side of abdomen and pelvis. The dilated portion of contrast seen in the right upper quadrant appears to be in expected position of the ileocolic anastomosis as noted on prior CT scan. No other definite contrast filling of colon is noted. Electronically Signed   By: Lynwood Landy Raddle M.D.   On: 12/18/2024 10:03   DG Abd Portable 1V-Small Bowel Protocol-Position  Verification Result Date: 12/17/2024 CLINICAL DATA:  NG placement. EXAM: PORTABLE ABDOMEN - 1 VIEW COMPARISON:  Abdominal radiograph dated 09/24/2024. FINDINGS: Enteric tube with tip in the left upper abdomen in the region of the body of the stomach. IMPRESSION: Enteric tube with tip in the body of the stomach. Electronically Signed   By: Vanetta Chou M.D.   On: 12/17/2024 19:23   CT ABDOMEN PELVIS W CONTRAST Result Date: 12/17/2024 EXAM: CT ABDOMEN AND PELVIS WITH CONTRAST 12/17/2024 01:43:16 AM TECHNIQUE: CT of the abdomen and pelvis was performed with the administration of 75 mL of iohexol  (OMNIPAQUE ) 350 MG/ML injection. Multiplanar reformatted images are provided for review.  Automated exposure control, iterative reconstruction, and/or weight-based adjustment of the mA/kV was utilized to reduce the radiation dose to as low as reasonably achievable. COMPARISON: 04/26/2024 CLINICAL HISTORY: Crohn's exacerbation FINDINGS: LOWER CHEST: No acute abnormality. LIVER: The liver is unremarkable. GALLBLADDER AND BILE DUCTS: Gallbladder is unremarkable. No biliary ductal dilatation. SPLEEN: No acute abnormality. PANCREAS: No acute abnormality. ADRENAL GLANDS: No acute abnormality. KIDNEYS, URETERS AND BLADDER: No stones in the kidneys or ureters. No hydronephrosis. No perinephric or periureteral stranding. Urinary bladder is unremarkable. GI AND BOWEL: Partial small bowel resection has been performed at least twice with 2 anastomotic staple lines seen involving the distal small bowel. There is a distal bowel obstruction with 2 points of transition best appreciated on sagittal image 92/7, likely related to a circumferential adhesion. The ileocolic anastomosis is seen within the right upper quadrant between the 2 points of obstruction. Proximal to this is the second anastomotic staple line with mild distention of the bowel in this region. Inflammatory stranding is seen involving the mesentery involved in the adhesion which encircles the 2 points of transition. There is a small umbilical hernia containing mesenteric fat which appears infiltrated, possibly reflecting incarceration of the herniated fat (49/3). Correlation with clinical examination is recommended. PERITONEUM AND RETROPERITONEUM: No ascites. No free air. VASCULATURE: Aorta is normal in caliber. LYMPH NODES: No lymphadenopathy. REPRODUCTIVE ORGANS: Bilateral tubal ligation clips are noted. BONES AND SOFT TISSUES: No acute osseous abnormality. No focal soft tissue abnormality. IMPRESSION: 1. Postsurgical changes from prior partial small bowel resections with 2 anastomoses including an ileocolic anastomosis. 2. Distal small bowel obstruction  with 2 transition points, likely related to a circumferential adhesion, with associated inflammatory mesenteric stranding. 3. Small umbilical hernia containing mesenteric fat, possibly reflecting incarceration of the herniated fat; consider physical examination correlation. Electronically signed by: Dorethia Molt MD 12/17/2024 03:24 AM EST RP Workstation: [id number]    Anti-infectives: Anti-infectives (From admission, onward)    None        Assessment/Plan  Likely adhesive SB -certainly has some stranding, this sounds like similar to before but with normal vitals and an exam that is not concerning don't think she needs to be urgently taken to the OR. -got NG tube, unfortunately it is small caliber and does not seem to be doing much. She has a lot of anxiety around NG tube placement and exchange but if she has nausea, increased distention, or vomiting then the NG tube will need to be exchanged for a larger one (16 or 32F).  - contrast given per tube remains in small bowel, consistent with ongoing SBO. Continue NGT and bowel rest -does not appear to have active crohns, complaint with her biologic therapy -would hold entyvio  for now, appreciate GI following  -pharm dvt prophylaxis recommended    LOS: 1 day   I reviewed nursing notes, Consultant  GI notes, hospitalist notes, last 24 h vitals and pain scores, last 48 h intake and output, last 24 h labs and trends, and last 24 h imaging results.  This care required moderate level of medical decision making.   Almarie Pringle, PA-C [facility] Please see Amion for pager number during day hours 7:00am-4:30pm

## 2024-12-18 NOTE — Progress Notes (Signed)
 Pt reports she is now passing gas.

## 2024-12-18 NOTE — Hospital Course (Signed)
 Jessica [name] is a 28 y.o. female with a history of Crohn's disease with history of ileal resection and diverting ileostomy takedown in 2016.  Patient presented secondary to abdominal pain and was found to have evidence of small bowel obstruction presumed secondary to adhesions.  General surgery and GI consulted.  NG tube placed for conservative therapy, which was successful in avoiding need for surgery.

## 2024-12-18 NOTE — Progress Notes (Signed)
 "  PROGRESS NOTE    Jessica [name]  FMW:[id number] DOB: 12-18-1996 DOA: 12/16/2024 PCP: Patient, No Pcp Per   Brief Narrative: Jessica [name] is a 28 y.o. female with a history of Crohn's disease with history of ileal resection and diverting ileostomy takedown in 2016.  Patient presented secondary to abdominal pain and was found to have evidence of small bowel obstruction presumed secondary to adhesions.  General surgery and GI consulted.  NG tube placed for conservative therapy in hopes to avoid surgical management.   Assessment and Plan:  Small bowel obstruction Complicated by patient's history of chron's disease in addition to history of small bowel resections with anastomoses.  CT imaging this admission identifies distal small bowel obstruction with 2 transition points likely related to circumferential adhesion.  General surgery consulted.  NG tube placed.  Patient without flatus or bowel movements. - General Surgery recommendations: Continue NG tube with possible need to exchange for larger tube (patient has expressed desire to decline exchange at this time)  History of Chron's disease Patient is manage with Entyvio  as an outpatient.  Unlikely Crohn's flare at this time.  GI recommendation to continue Entyvio  as an outpatient.  Umbilical hernia CT imaging from admission with possible incarceration.  Elevated blood pressure History of gestational hypertension Patient is not on medication therapy as an outpatient. Blood pressure uncontrolled this admission. Asymptomatic. Possibly related to pain. - Hydralazine IV as needed  Mild anemia Likely related to iron  deficiency. Last iron  panel significant for low-normal ferritin.   DVT prophylaxis: Lovenox  Code Status:   Code Status: Full Code Family Communication: Boyfriend at bedside Disposition Plan: Discharge home pending ongoing general surgery recommendations/management and improvement of bowel obstruction   Consultants:   Gastroenterology General surgery  Procedures:  NG tube placement  Antimicrobials: None    Subjective: Patient reports ongoing abdominal pain.  Objective: BP (!) 137/94 (BP Location: Right Arm)   Pulse 74   Temp 97.6 F (36.4 C) (Oral)   Resp 18   Ht 5' 6 (1.676 m)   Wt 88 kg   SpO2 98%   BMI 31.31 kg/m   Examination:  General exam: Appears calm and comfortable. Respiratory system: Clear to auscultation. Respiratory effort normal. Cardiovascular system: S1 & S2 heard, RRR. No murmur. Gastrointestinal system: Abdomen is mildly distended, soft and generalized tenderness. Normal bowel sounds heard. Central nervous system: Alert and oriented. No focal neurological deficits. Musculoskeletal: No edema. No calf tenderness Psychiatry: Judgement and insight appear normal. Mood & affect appropriate.    Data Reviewed: I have personally reviewed following labs and imaging studies  CBC Lab Results  Component Value Date   WBC 9.3 12/18/2024   RBC 4.00 12/18/2024   HGB 11.9 (L) 12/18/2024   HCT 35.9 (L) 12/18/2024   MCV 89.8 12/18/2024   MCH 29.8 12/18/2024   PLT 290 12/18/2024   MCHC 33.1 12/18/2024   RDW 13.0 12/18/2024   LYMPHSABS 2.4 10/28/2024   MONOABS 0.6 10/28/2024   EOSABS 0.2 10/28/2024   BASOSABS 0.0 10/28/2024     Last metabolic panel Lab Results  Component Value Date   NA 138 12/18/2024   K 3.8 12/18/2024   CL 104 12/18/2024   CO2 25 12/18/2024   BUN 5 (L) 12/18/2024   CREATININE 0.60 12/18/2024   GLUCOSE 82 12/18/2024   GFRNONAA >60 12/18/2024   GFRAA >60 10/19/2019   CALCIUM  8.7 (L) 12/18/2024   PHOS 3.1 12/02/2010   PROT 6.8 12/18/2024   ALBUMIN 3.6  12/18/2024   LABGLOB 3.1 10/16/2019   AGRATIO 1.2 10/16/2019   BILITOT 0.5 12/18/2024   ALKPHOS 71 12/18/2024   AST 14 (L) 12/18/2024   ALT 13 12/18/2024   ANIONGAP 9 12/18/2024    GFR: Estimated Creatinine Clearance: 114.9 mL/min (by C-G formula based on SCr of 0.6 mg/dL).  No  results found for this or any previous visit (from the past 240 hours).    Radiology Studies: DG Abd Portable 1V-Small Bowel Protocol-Position Verification Result Date: 12/17/2024 CLINICAL DATA:  NG placement. EXAM: PORTABLE ABDOMEN - 1 VIEW COMPARISON:  Abdominal radiograph dated 09/24/2024. FINDINGS: Enteric tube with tip in the left upper abdomen in the region of the body of the stomach. IMPRESSION: Enteric tube with tip in the body of the stomach. Electronically Signed   By: Vanetta Chou M.D.   On: 12/17/2024 19:23   CT ABDOMEN PELVIS W CONTRAST Result Date: 12/17/2024 EXAM: CT ABDOMEN AND PELVIS WITH CONTRAST 12/17/2024 01:43:16 AM TECHNIQUE: CT of the abdomen and pelvis was performed with the administration of 75 mL of iohexol  (OMNIPAQUE ) 350 MG/ML injection. Multiplanar reformatted images are provided for review. Automated exposure control, iterative reconstruction, and/or weight-based adjustment of the mA/kV was utilized to reduce the radiation dose to as low as reasonably achievable. COMPARISON: 04/26/2024 CLINICAL HISTORY: Crohn's exacerbation FINDINGS: LOWER CHEST: No acute abnormality. LIVER: The liver is unremarkable. GALLBLADDER AND BILE DUCTS: Gallbladder is unremarkable. No biliary ductal dilatation. SPLEEN: No acute abnormality. PANCREAS: No acute abnormality. ADRENAL GLANDS: No acute abnormality. KIDNEYS, URETERS AND BLADDER: No stones in the kidneys or ureters. No hydronephrosis. No perinephric or periureteral stranding. Urinary bladder is unremarkable. GI AND BOWEL: Partial small bowel resection has been performed at least twice with 2 anastomotic staple lines seen involving the distal small bowel. There is a distal bowel obstruction with 2 points of transition best appreciated on sagittal image 92/7, likely related to a circumferential adhesion. The ileocolic anastomosis is seen within the right upper quadrant between the 2 points of obstruction. Proximal to this is the second  anastomotic staple line with mild distention of the bowel in this region. Inflammatory stranding is seen involving the mesentery involved in the adhesion which encircles the 2 points of transition. There is a small umbilical hernia containing mesenteric fat which appears infiltrated, possibly reflecting incarceration of the herniated fat (49/3). Correlation with clinical examination is recommended. PERITONEUM AND RETROPERITONEUM: No ascites. No free air. VASCULATURE: Aorta is normal in caliber. LYMPH NODES: No lymphadenopathy. REPRODUCTIVE ORGANS: Bilateral tubal ligation clips are noted. BONES AND SOFT TISSUES: No acute osseous abnormality. No focal soft tissue abnormality. IMPRESSION: 1. Postsurgical changes from prior partial small bowel resections with 2 anastomoses including an ileocolic anastomosis. 2. Distal small bowel obstruction with 2 transition points, likely related to a circumferential adhesion, with associated inflammatory mesenteric stranding. 3. Small umbilical hernia containing mesenteric fat, possibly reflecting incarceration of the herniated fat; consider physical examination correlation. Electronically signed by: Dorethia Molt MD 12/17/2024 03:24 AM EST RP Workstation: [id number]      LOS: 1 day    Elgin Lam, MD [facility] 12/18/2024, 7:30 AM   If 7PM-7AM, please contact night-coverage [email or web address]  "

## 2024-12-19 ENCOUNTER — Inpatient Hospital Stay (HOSPITAL_COMMUNITY)

## 2024-12-19 DIAGNOSIS — R030 Elevated blood-pressure reading, without diagnosis of hypertension: Secondary | ICD-10-CM | POA: Diagnosis not present

## 2024-12-19 DIAGNOSIS — K56609 Unspecified intestinal obstruction, unspecified as to partial versus complete obstruction: Secondary | ICD-10-CM | POA: Diagnosis not present

## 2024-12-19 NOTE — Progress Notes (Signed)
 Pt had small bowel movement without difficulty. Order upgraded to a soft diet. VEVA LITTIE POD, RN

## 2024-12-19 NOTE — Plan of Care (Signed)

## 2024-12-19 NOTE — Progress Notes (Signed)
 "  PROGRESS NOTE    Jessica [name]  FMW:[id number] DOB: 12-24-1995 DOA: 12/16/2024 PCP: Patient, No Pcp Per   Brief Narrative: Jessica [name] is a 29 y.o. female with a history of Crohn's disease with history of ileal resection and diverting ileostomy takedown in 2016.  Patient presented secondary to abdominal pain and was found to have evidence of small bowel obstruction presumed secondary to adhesions.  General surgery and GI consulted.  NG tube placed for conservative therapy in hopes to avoid surgical management.   Assessment and Plan:  Small bowel obstruction Complicated by patient's history of chron's disease in addition to history of small bowel resections with anastomoses.  CT imaging this admission identifies distal small bowel obstruction with 2 transition points likely related to circumferential adhesion.  General surgery consulted.  NG tube placed.  Patient without flatus or bowel movements. - General Surgery recommendations: Continue NG tube  History of Chron's disease Patient is manage with Entyvio  as an outpatient.  Unlikely Crohn's flare at this time.  GI recommendation to continue Entyvio  as an outpatient.  Umbilical hernia CT imaging from admission with possible incarceration.  Elevated blood pressure History of gestational hypertension Patient is not on medication therapy as an outpatient. Blood pressure uncontrolled this admission. Asymptomatic. Possibly related to pain. - Hydralazine IV as needed  Mild anemia Likely related to iron  deficiency. Last iron  panel significant for low-normal ferritin.   DVT prophylaxis: Lovenox  Code Status:   Code Status: Full Code Family Communication: Boyfriend at bedside Disposition Plan: Discharge home pending ongoing general surgery recommendations/management and improvement of bowel obstruction   Consultants:  Gastroenterology General surgery  Procedures:  NG tube placement  Antimicrobials: None     Subjective: Patient reports ongoing abdominal pain.  Objective: BP (!) 145/98 (BP Location: Right Arm)   Pulse 82   Temp 98.2 F (36.8 C) (Oral)   Resp 18   Ht 5' 6 (1.676 m)   Wt 88.6 kg   LMP  (LMP Unknown)   SpO2 95%   BMI 31.54 kg/m   Examination:  General exam: Appears calm and comfortable. Respiratory system: Clear to auscultation. Respiratory effort normal. Cardiovascular system: S1 & S2 heard, RRR. No murmur. Gastrointestinal system: Abdomen is mildly distended, soft and with mild generalized tenderness. Decreased bowel sounds heard. Central nervous system: Alert and oriented. No focal neurological deficits. Musculoskeletal: No edema. No calf tenderness Psychiatry: Judgement and insight appear normal. Mood & affect appropriate.    Data Reviewed: I have personally reviewed following labs and imaging studies  CBC Lab Results  Component Value Date   WBC 9.3 12/18/2024   RBC 4.00 12/18/2024   HGB 11.9 (L) 12/18/2024   HCT 35.9 (L) 12/18/2024   MCV 89.8 12/18/2024   MCH 29.8 12/18/2024   PLT 290 12/18/2024   MCHC 33.1 12/18/2024   RDW 13.0 12/18/2024   LYMPHSABS 2.4 10/28/2024   MONOABS 0.6 10/28/2024   EOSABS 0.2 10/28/2024   BASOSABS 0.0 10/28/2024     Last metabolic panel Lab Results  Component Value Date   NA 138 12/18/2024   K 3.8 12/18/2024   CL 104 12/18/2024   CO2 25 12/18/2024   BUN 5 (L) 12/18/2024   CREATININE 0.60 12/18/2024   GLUCOSE 82 12/18/2024   GFRNONAA >60 12/18/2024   GFRAA >60 10/19/2019   CALCIUM  8.7 (L) 12/18/2024   PHOS 3.1 12/02/2010   PROT 6.8 12/18/2024   ALBUMIN 3.6 12/18/2024   LABGLOB 3.1 10/16/2019   AGRATIO 1.2  10/16/2019   BILITOT 0.5 12/18/2024   ALKPHOS 71 12/18/2024   AST 14 (L) 12/18/2024   ALT 13 12/18/2024   ANIONGAP 9 12/18/2024    GFR: Estimated Creatinine Clearance: 115.3 mL/min (by C-G formula based on SCr of 0.6 mg/dL).  No results found for this or any previous visit (from the past 240  hours).    Radiology Studies: DG Abd Portable 1V-Small Bowel Obstruction Protocol-initial, 8 hr delay Result Date: 12/18/2024 CLINICAL DATA:  Small bowel obstruction EXAM: PORTABLE ABDOMEN - 1 VIEW COMPARISON:  Yesterday FINDINGS: There appears to be contrast within dilated distal small bowel loops in the right side of abdomen and pelvis. The dilated portion of contrast seen in right upper quadrant appears to be in expected position of the ileocolic anastomosis as noted on prior CT scan. No other definite contrast filling of colon is noted. IMPRESSION: Contrast is noted within dilated distal small bowel loops in the right side of abdomen and pelvis. The dilated portion of contrast seen in the right upper quadrant appears to be in expected position of the ileocolic anastomosis as noted on prior CT scan. No other definite contrast filling of colon is noted. Electronically Signed   By: Lynwood Landy Raddle M.D.   On: 12/18/2024 10:03   DG Abd Portable 1V-Small Bowel Protocol-Position Verification Result Date: 12/17/2024 CLINICAL DATA:  NG placement. EXAM: PORTABLE ABDOMEN - 1 VIEW COMPARISON:  Abdominal radiograph dated 09/24/2024. FINDINGS: Enteric tube with tip in the left upper abdomen in the region of the body of the stomach. IMPRESSION: Enteric tube with tip in the body of the stomach. Electronically Signed   By: Vanetta Chou M.D.   On: 12/17/2024 19:23      LOS: 2 days    Elgin Lam, MD [facility] 12/19/2024, 7:53 AM   If 7PM-7AM, please contact night-coverage [email or web address]  "

## 2024-12-19 NOTE — Progress Notes (Signed)
 [facility] Progress Note     Subjective: CC:  Feeling better- having flatus. No BM yet. NG tube > 1,000 mL/24h  Objective: Vital signs in last 24 hours: Temp:  [97.9 F (36.6 C)-98.8 F (37.1 C)] 98.8 F (37.1 C) (01/01 0832) Pulse Rate:  [74-90] 74 (01/01 0832) Resp:  [17-18] 17 (01/01 0832) BP: (131-145)/(86-98) 141/89 (01/01 0832) SpO2:  [95 %-99 %] 98 % (01/01 0832) Weight:  [88.6 kg] 88.6 kg (01/01 0500) Last BM Date : 12/15/24  Intake/Output from previous day: 12/31 0701 - 01/01 0700 In: 2310.7 [I.V.:2280.7; NG/GT:30] Out: 960 [Emesis/NG output:960] Intake/Output this shift: No intake/output data recorded.  PE: Gen:  Alert, NAD, pleasant Card:  Regular rate and rhythm Pulm:  Normal effort ORA Abd: Soft, overall non-tender - mild tenderness right mid abdomen, non-distended, no guarding, previou scars appear well healing  NGT in place with thick bilious effluent (~400 mL in new cannister from this AM) Skin: warm and dry, no rashes  Psych: A&Ox3   Lab Results:  Recent Labs    12/16/24 2136 12/18/24 0429  WBC 14.9* 9.3  HGB 14.3 11.9*  HCT 43.8 35.9*  PLT 397 290   BMET Recent Labs    12/18/24 0429  NA 138  K 3.8  CL 104  CO2 25  GLUCOSE 82  BUN 5*  CREATININE 0.60  CALCIUM  8.7*   PT/INR No results for input(s): LABPROT, INR in the last 72 hours. CMP     Component Value Date/Time   NA 138 12/18/2024 0429   NA 138 10/16/2019 1420   K 3.8 12/18/2024 0429   CL 104 12/18/2024 0429   CO2 25 12/18/2024 0429   GLUCOSE 82 12/18/2024 0429   BUN 5 (L) 12/18/2024 0429   BUN 6 10/16/2019 1420   CREATININE 0.60 12/18/2024 0429   CALCIUM  8.7 (L) 12/18/2024 0429   PROT 6.8 12/18/2024 0429   PROT 6.8 10/16/2019 1420   ALBUMIN 3.6 12/18/2024 0429   ALBUMIN 3.7 (L) 10/16/2019 1420   AST 14 (L) 12/18/2024 0429   ALT 13 12/18/2024 0429   ALKPHOS 71 12/18/2024 0429   BILITOT 0.5 12/18/2024 0429   BILITOT 0.3 10/16/2019 1420   GFRNONAA  >60 12/18/2024 0429   GFRAA >60 10/19/2019 1655   Lipase     Component Value Date/Time   LIPASE 16 12/16/2024 0011       Studies/Results: DG Abd Portable 1V-Small Bowel Obstruction Protocol-initial, 8 hr delay Result Date: 12/18/2024 CLINICAL DATA:  Small bowel obstruction EXAM: PORTABLE ABDOMEN - 1 VIEW COMPARISON:  Yesterday FINDINGS: There appears to be contrast within dilated distal small bowel loops in the right side of abdomen and pelvis. The dilated portion of contrast seen in right upper quadrant appears to be in expected position of the ileocolic anastomosis as noted on prior CT scan. No other definite contrast filling of colon is noted. IMPRESSION: Contrast is noted within dilated distal small bowel loops in the right side of abdomen and pelvis. The dilated portion of contrast seen in the right upper quadrant appears to be in expected position of the ileocolic anastomosis as noted on prior CT scan. No other definite contrast filling of colon is noted. Electronically Signed   By: Lynwood Landy Raddle M.D.   On: 12/18/2024 10:03   DG Abd Portable 1V-Small Bowel Protocol-Position Verification Result Date: 12/17/2024 CLINICAL DATA:  NG placement. EXAM: PORTABLE ABDOMEN - 1 VIEW COMPARISON:  Abdominal radiograph dated 09/24/2024. FINDINGS: Enteric tube with tip in  the left upper abdomen in the region of the body of the stomach. IMPRESSION: Enteric tube with tip in the body of the stomach. Electronically Signed   By: Vanetta Chou M.D.   On: 12/17/2024 19:23    Anti-infectives: Anti-infectives (From admission, onward)    None        Assessment/Plan  Likely adhesive SB -certainly has some stranding, this sounds like similar to before but with normal vitals and an exam that is not concerning don't think she needs to be urgently taken to the OR. -got NG tube and SBO protocol - contrast in colon, having some flatus but no BM, high NG output, continue NGT for today and await further  bowel function.  -does not appear to have active crohns, complaint with her biologic therapy -would hold entyvio  for now, appreciate GI following  -pharm dvt w/ daily lovenox    LOS: 2 days   I reviewed nursing notes, Consultant GI notes, hospitalist notes, last 24 h vitals and pain scores, last 48 h intake and output, last 24 h labs and trends, and last 24 h imaging results.  This care required moderate level of medical decision making.   Almarie Pringle, PA-C [facility] Please see Amion for pager number during day hours 7:00am-4:30pm

## 2024-12-20 ENCOUNTER — Other Ambulatory Visit (HOSPITAL_COMMUNITY): Payer: Self-pay

## 2024-12-20 ENCOUNTER — Inpatient Hospital Stay (HOSPITAL_COMMUNITY)
Admission: RE | Admit: 2024-12-20 | Discharge: 2024-12-20 | Disposition: A | Discharge status: Home / Self Care | Source: Ambulatory Visit | Attending: Gastroenterology | Admitting: Gastroenterology

## 2024-12-20 ENCOUNTER — Telehealth: Payer: Self-pay

## 2024-12-20 DIAGNOSIS — K56609 Unspecified intestinal obstruction, unspecified as to partial versus complete obstruction: Secondary | ICD-10-CM | POA: Diagnosis not present

## 2024-12-20 MED ORDER — HYDROMORPHONE HCL 1 MG/ML IJ SOLN: 0.5000 mg | INTRAMUSCULAR | Status: DC | PRN

## 2024-12-20 MED ORDER — OXYCODONE-ACETAMINOPHEN 5-325 MG PO TABS
1.0000 | ORAL_TABLET | Freq: Four times a day (QID) | ORAL | 0 refills | Status: AC | PRN
  Filled 2024-12-20: qty 12, 3d supply, fill #0

## 2024-12-20 MED ORDER — OXYCODONE-ACETAMINOPHEN 5-325 MG PO TABS
1.0000 | ORAL_TABLET | ORAL | Status: DC | PRN
  Administered 2024-12-20 (×2): 1 via ORAL
  Filled 2024-12-20 (×2): qty 1

## 2024-12-20 MED ORDER — ONDANSETRON 4 MG PO TBDP
4.0000 mg | ORAL_TABLET | Freq: Three times a day (TID) | ORAL | 0 refills | Status: AC | PRN
  Filled 2024-12-20: qty 20, 7d supply, fill #0

## 2024-12-20 NOTE — Plan of Care (Signed)

## 2024-12-20 NOTE — Telephone Encounter (Signed)
 Pt already scheduled for f/u appt. Entyvio  rescheduled for 12/27/24 at 9am.

## 2024-12-20 NOTE — Discharge Summary (Addendum)
 " Physician Discharge Summary   Patient: Jessica [name] MRN: [id number] DOB: [birth year]  Admit date:     12/16/2024  Discharge date: 12/20/2024  Discharge Physician: Elgin Lam, MD   PCP: Patient, No Pcp Per   Recommendations at discharge:  PCP visit for hospital follow-up General surgery follow-up Blood pressure monitoring for consideration of starting antihypertensive therapy  Discharge Diagnoses: Principal Problem:   Abdominal pain Active Problems:   SBO (small bowel obstruction) (HCC)  Resolved Problems:   * No resolved hospital problems. *  Hospital Course: Jessica [name] is a 29 y.o. female with a history of Crohn's disease with history of ileal resection and diverting ileostomy takedown in 2016.  Patient presented secondary to abdominal pain and was found to have evidence of small bowel obstruction presumed secondary to adhesions.  General surgery and GI consulted.  NG tube placed for conservative therapy, which was successful in avoiding need for surgery.  Assessment and Plan:  Small bowel obstruction Complicated by patient's history of chron's disease in addition to history of small bowel resections with anastomoses.  CT imaging this admission identifies distal small bowel obstruction with 2 transition points likely related to circumferential adhesion.  General surgery consulted.  NG tube placed.  Patient with development of flatus. Diet advanced and patient had multiple bowel movements prior to discharge.   History of Chron's disease Patient is manage with Entyvio  as an outpatient.  Unlikely Crohn's flare at this time.  GI recommendation to continue Entyvio  as an outpatient.   Umbilical hernia CT imaging from admission with possible incarceration.   Elevated blood pressure History of gestational hypertension Patient is not on medication therapy as an outpatient. Blood pressure uncontrolled this admission. Asymptomatic. Possibly related to pain. Improved prior to  discharge. Recommend outpatient PCP follow-up.   Mild anemia Likely related to iron  deficiency. Last iron  panel significant for low-normal ferritin. Follow-up with PCP.   Consultants: General surgery Procedures performed: None  Disposition: Home Diet recommendation: Soft diet   DISCHARGE MEDICATION: Allergies as of 12/20/2024   No Known Allergies      Medication List     STOP taking these medications    acetaminophen  500 MG tablet Commonly known as: TYLENOL    fluconazole  150 MG tablet Commonly known as: Diflucan    lidocaine  2 % solution Commonly known as: XYLOCAINE    Methocarbamol  1000 MG Tabs   Oxycodone  HCl 10 MG Tabs       TAKE these medications    bisacodyl  10 MG suppository Commonly known as: DULCOLAX Place 1 suppository (10 mg total) rectally daily.   budesonide  3 MG 24 hr capsule Commonly known as: ENTOCORT EC  Take 3 capsules (9 mg total) by mouth daily. Office visit for further refills   ferrous sulfate  325 (65 FE) MG EC tablet Take 1 tablet (325 mg total) by mouth daily with breakfast.   folic acid  1 MG tablet Commonly known as: FOLVITE  Take 1 tablet (1 mg total) by mouth daily.   ondansetron  4 MG disintegrating tablet Commonly known as: ZOFRAN -ODT Take 1 tablet (4 mg total) by mouth every 8 (eight) hours as needed for nausea or vomiting.   oxyCODONE -acetaminophen  5-325 MG tablet Commonly known as: PERCOCET/ROXICET Take 1 tablet by mouth every 6 (six) hours as needed for up to 3 days for severe pain (pain score 7-10).   sodium chloride  0.9 % SOLN 250 mL with vedolizumab  300 MG SOLR 300 mg Inject 300 mg into the vein every 28 (twenty-eight) days. Entyvio  every 4  weeks at [facility] infusion center.   Vitamin D  (Ergocalciferol ) 1.25 MG (50000 UNIT) Caps capsule Commonly known as: DRISDOL  Take 1 capsule (50,000 Units total) by mouth every 7 (seven) days.        Discharge Exam: BP (!) 144/111 (BP Location: Right Arm)   Pulse 95   Temp 98.2 F  (36.8 C)   Resp 17   Ht 5' 6 (1.676 m)   Wt 86.1 kg   LMP  (LMP Unknown)   SpO2 98%   BMI 30.63 kg/m   General exam: Appears calm and comfortable. Respiratory system: Clear to auscultation. Respiratory effort normal. Cardiovascular system: S1 & S2 heard, RRR.  Gastrointestinal system: Abdomen is mildly distended, soft and with generalized tenderness. Normal bowel sounds heard. Central nervous system: Alert and oriented. No focal neurological deficits. Musculoskeletal: No edema. No calf tenderness Psychiatry: Judgement and insight appear normal. Mood & affect appropriate.   Condition at discharge: stable  The results of significant diagnostics from this hospitalization (including imaging, microbiology, ancillary and laboratory) are listed below for reference.   Imaging Studies: DG Abd Portable 1V Result Date: 12/19/2024 CLINICAL DATA:  Small bowel obstruction. EXAM: DG ABD PORTABLE 1V COMPARISON:  Radiograph yesterday FINDINGS: Enteric contrast has progressed into the colon with opacification of the ascending and transverse. Small amount of residual contrast persists within distal small bowel. Enteric tube tip in the stomach, side-port in the region of the distal esophagus. IMPRESSION: 1. Enteric contrast has progressed into the colon. Small amount of residual contrast persists within distal small bowel. Findings may represent partial or resolving small bowel obstruction. 2. Enteric tube tip in the stomach, side-port in the region of the distal esophagus. Electronically Signed   By: Andrea Gasman M.D.   On: 12/19/2024 12:56   DG Abd Portable 1V-Small Bowel Obstruction Protocol-initial, 8 hr delay Result Date: 12/18/2024 CLINICAL DATA:  Small bowel obstruction EXAM: PORTABLE ABDOMEN - 1 VIEW COMPARISON:  Yesterday FINDINGS: There appears to be contrast within dilated distal small bowel loops in the right side of abdomen and pelvis. The dilated portion of contrast seen in right upper  quadrant appears to be in expected position of the ileocolic anastomosis as noted on prior CT scan. No other definite contrast filling of colon is noted. IMPRESSION: Contrast is noted within dilated distal small bowel loops in the right side of abdomen and pelvis. The dilated portion of contrast seen in the right upper quadrant appears to be in expected position of the ileocolic anastomosis as noted on prior CT scan. No other definite contrast filling of colon is noted. Electronically Signed   By: Lynwood Landy Raddle M.D.   On: 12/18/2024 10:03   DG Abd Portable 1V-Small Bowel Protocol-Position Verification Result Date: 12/17/2024 CLINICAL DATA:  NG placement. EXAM: PORTABLE ABDOMEN - 1 VIEW COMPARISON:  Abdominal radiograph dated 09/24/2024. FINDINGS: Enteric tube with tip in the left upper abdomen in the region of the body of the stomach. IMPRESSION: Enteric tube with tip in the body of the stomach. Electronically Signed   By: Vanetta Chou M.D.   On: 12/17/2024 19:23   CT ABDOMEN PELVIS W CONTRAST Result Date: 12/17/2024 EXAM: CT ABDOMEN AND PELVIS WITH CONTRAST 12/17/2024 01:43:16 AM TECHNIQUE: CT of the abdomen and pelvis was performed with the administration of 75 mL of iohexol  (OMNIPAQUE ) 350 MG/ML injection. Multiplanar reformatted images are provided for review. Automated exposure control, iterative reconstruction, and/or weight-based adjustment of the mA/kV was utilized to reduce the radiation dose to as  low as reasonably achievable. COMPARISON: 04/26/2024 CLINICAL HISTORY: Crohn's exacerbation FINDINGS: LOWER CHEST: No acute abnormality. LIVER: The liver is unremarkable. GALLBLADDER AND BILE DUCTS: Gallbladder is unremarkable. No biliary ductal dilatation. SPLEEN: No acute abnormality. PANCREAS: No acute abnormality. ADRENAL GLANDS: No acute abnormality. KIDNEYS, URETERS AND BLADDER: No stones in the kidneys or ureters. No hydronephrosis. No perinephric or periureteral stranding. Urinary bladder is  unremarkable. GI AND BOWEL: Partial small bowel resection has been performed at least twice with 2 anastomotic staple lines seen involving the distal small bowel. There is a distal bowel obstruction with 2 points of transition best appreciated on sagittal image 92/7, likely related to a circumferential adhesion. The ileocolic anastomosis is seen within the right upper quadrant between the 2 points of obstruction. Proximal to this is the second anastomotic staple line with mild distention of the bowel in this region. Inflammatory stranding is seen involving the mesentery involved in the adhesion which encircles the 2 points of transition. There is a small umbilical hernia containing mesenteric fat which appears infiltrated, possibly reflecting incarceration of the herniated fat (49/3). Correlation with clinical examination is recommended. PERITONEUM AND RETROPERITONEUM: No ascites. No free air. VASCULATURE: Aorta is normal in caliber. LYMPH NODES: No lymphadenopathy. REPRODUCTIVE ORGANS: Bilateral tubal ligation clips are noted. BONES AND SOFT TISSUES: No acute osseous abnormality. No focal soft tissue abnormality. IMPRESSION: 1. Postsurgical changes from prior partial small bowel resections with 2 anastomoses including an ileocolic anastomosis. 2. Distal small bowel obstruction with 2 transition points, likely related to a circumferential adhesion, with associated inflammatory mesenteric stranding. 3. Small umbilical hernia containing mesenteric fat, possibly reflecting incarceration of the herniated fat; consider physical examination correlation. Electronically signed by: Dorethia Molt MD 12/17/2024 03:24 AM EST RP Workstation: [id number]    Microbiology: Results for orders placed or performed in visit on 07/19/24  Calprotectin, Fecal     Status: None   Collection Time: 07/19/24  8:57 AM   Specimen: Stool   Stool  Result Value Ref Range Status   Calprotectin, Fecal 66 0 - 120 ug/g Final    Comment:  Concentration     Interpretation   Follow-Up < 5 - 50 ug/g     Normal           None >50 -120 ug/g     Borderline       Re-evaluate in 4-6 weeks     >120 ug/g     Abnormal         Repeat as clinically                                    indicated     Labs: CBC: Recent Labs  Lab 12/16/24 2136 12/18/24 0429  WBC 14.9* 9.3  HGB 14.3 11.9*  HCT 43.8 35.9*  MCV 90.3 89.8  PLT 397 290   Basic Metabolic Panel: Recent Labs  Lab 12/16/24 0011 12/18/24 0429  NA 137 138  K 3.8 3.8  CL 102 104  CO2 26 25  GLUCOSE 94 82  BUN 7 5*  CREATININE 0.65 0.60  CALCIUM  9.2 8.7*   Liver Function Tests: Recent Labs  Lab 12/16/24 0011 12/18/24 0429  AST 19 14*  ALT 19 13  ALKPHOS 88 71  BILITOT 0.5 0.5  PROT 8.0 6.8  ALBUMIN 4.3 3.6    Discharge time spent: 35 minutes.  Signed: Elgin Lam, MD [facility] 12/20/2024 "

## 2024-12-20 NOTE — Progress Notes (Signed)
 [facility] Progress Note     Subjective: CC:  Continues to feel better- reports 3 semi-solid/loose and nonbloody stools overnight. Tolerating small amounts of PO with some grumbling and minor abdominal discomfort. Still some R sided abdominal soreness. +flatus. Denies nausea/vomiting.  Objective: Vital signs in last 24 hours: Temp:  [97.6 F (36.4 C)-98.8 F (37.1 C)] 98 F (36.7 C) (01/02 0458) Pulse Rate:  [69-84] 71 (01/02 0458) Resp:  [17-18] 17 (01/02 0458) BP: (121-141)/(78-89) 135/85 (01/02 0458) SpO2:  [98 %-99 %] 99 % (01/02 0458) Weight:  [86.1 kg] 86.1 kg (01/02 0458) Last BM Date : 12/19/24 (x3)  Intake/Output from previous day: 01/01 0701 - 01/02 0700 In: -  Out: 200 [Urine:200] Intake/Output this shift: No intake/output data recorded.  PE: Gen:  Alert, NAD, pleasant Card:  Regular rate and rhythm Pulm:  Normal effort ORA Abd: Soft, overall non-tender - mild tenderness right mid abdomen and RLQ, non-distended, no guarding, previou scars appear well healing Skin: warm and dry, no rashes  Psych: A&Ox3   Lab Results:  Recent Labs    12/18/24 0429  WBC 9.3  HGB 11.9*  HCT 35.9*  PLT 290   BMET Recent Labs    12/18/24 0429  NA 138  K 3.8  CL 104  CO2 25  GLUCOSE 82  BUN 5*  CREATININE 0.60  CALCIUM  8.7*   PT/INR No results for input(s): LABPROT, INR in the last 72 hours. CMP     Component Value Date/Time   NA 138 12/18/2024 0429   NA 138 10/16/2019 1420   K 3.8 12/18/2024 0429   CL 104 12/18/2024 0429   CO2 25 12/18/2024 0429   GLUCOSE 82 12/18/2024 0429   BUN 5 (L) 12/18/2024 0429   BUN 6 10/16/2019 1420   CREATININE 0.60 12/18/2024 0429   CALCIUM  8.7 (L) 12/18/2024 0429   PROT 6.8 12/18/2024 0429   PROT 6.8 10/16/2019 1420   ALBUMIN 3.6 12/18/2024 0429   ALBUMIN 3.7 (L) 10/16/2019 1420   AST 14 (L) 12/18/2024 0429   ALT 13 12/18/2024 0429   ALKPHOS 71 12/18/2024 0429   BILITOT 0.5 12/18/2024 0429   BILITOT  0.3 10/16/2019 1420   GFRNONAA >60 12/18/2024 0429   GFRAA >60 10/19/2019 1655   Lipase     Component Value Date/Time   LIPASE 16 12/16/2024 0011       Studies/Results: DG Abd Portable 1V Result Date: 12/19/2024 CLINICAL DATA:  Small bowel obstruction. EXAM: DG ABD PORTABLE 1V COMPARISON:  Radiograph yesterday FINDINGS: Enteric contrast has progressed into the colon with opacification of the ascending and transverse. Small amount of residual contrast persists within distal small bowel. Enteric tube tip in the stomach, side-port in the region of the distal esophagus. IMPRESSION: 1. Enteric contrast has progressed into the colon. Small amount of residual contrast persists within distal small bowel. Findings may represent partial or resolving small bowel obstruction. 2. Enteric tube tip in the stomach, side-port in the region of the distal esophagus. Electronically Signed   By: Andrea Gasman M.D.   On: 12/19/2024 12:56   DG Abd Portable 1V-Small Bowel Obstruction Protocol-initial, 8 hr delay Result Date: 12/18/2024 CLINICAL DATA:  Small bowel obstruction EXAM: PORTABLE ABDOMEN - 1 VIEW COMPARISON:  Yesterday FINDINGS: There appears to be contrast within dilated distal small bowel loops in the right side of abdomen and pelvis. The dilated portion of contrast seen in right upper quadrant appears to be in expected position of the ileocolic anastomosis  as noted on prior CT scan. No other definite contrast filling of colon is noted. IMPRESSION: Contrast is noted within dilated distal small bowel loops in the right side of abdomen and pelvis. The dilated portion of contrast seen in the right upper quadrant appears to be in expected position of the ileocolic anastomosis as noted on prior CT scan. No other definite contrast filling of colon is noted. Electronically Signed   By: Lynwood Landy Raddle M.D.   On: 12/18/2024 10:03    Anti-infectives: Anti-infectives (From admission, onward)    None         Assessment/Plan  Likely adhesive SB -certainly has some stranding, this sounds like similar to before but with normal vitals and an exam that is not concerning don't think she needs to be urgently taken to the OR. -got NG tube and SBO protocol >> contrast in colon - SBO clinically resolving. Now having bowel function and tolerating PO with some ongoing abdominal pain that is improving. If she does well with PO intake today without recurrence of sxs then, from a [facility] standpoint, she could be discharged home this afternoon.   LOS: 3 days   I reviewed nursing notes, Consultant GI notes, hospitalist notes, last 24 h vitals and pain scores, last 48 h intake and output, last 24 h labs and trends, and last 24 h imaging results.  This care required moderate level of medical decision making.   Almarie Pringle, PA-C [facility] Please see Amion for pager number during day hours 7:00am-4:30pm

## 2024-12-20 NOTE — Telephone Encounter (Signed)
-----   Message from Elspeth Naval, MD sent at 12/19/2024  2:42 PM EST ----- Regarding: FW: Hospital follow-up Can someone help patient reschedule Entyvio  post hospitalization and coordinate outpatient follow up with me or APP in the next 1 month or so. Thanks ----- Message ----- From: Legrand Victory LITTIE DOUGLAS, MD Sent: 12/17/2024   5:12 PM EST To: Elspeth SHAUNNA Naval, MD Subject: Hospital follow-up                             Marcey,  This Crohn's patient of yours on Entyvio  is admitted with recurrent SBO.  As was the case during her May hospitalization for SBO, it appears due to adhesions rather than her Crohn's activity.  Just got here today, needs NG tube drainage and bowel rest.  She was due for Entyvio  today but will need that rescheduled for after she is discharged.  Thanks  HD

## 2024-12-25 ENCOUNTER — Telehealth (HOSPITAL_COMMUNITY): Payer: Self-pay

## 2024-12-25 NOTE — Telephone Encounter (Signed)
 Auth Submission: NO AUTH NEEDED Site of care: Site of care: [facility] Payer: [employer] Medication & CPT/J Code(s) submitted: Entyvio  (Vedolizumab ) J3380 Diagnosis Code: K50.812 Route of submission (phone, fax, portal):  Phone # Fax # Auth type: Buy/Bill HB Units/visits requested: 300mg  q4weeks Reference number:  Approval from: 12/19/24 to 09/17/25

## 2024-12-27 ENCOUNTER — Encounter (HOSPITAL_COMMUNITY)

## 2025-01-02 ENCOUNTER — Inpatient Hospital Stay (HOSPITAL_COMMUNITY): Admission: RE | Admit: 2025-01-02 | Source: Ambulatory Visit

## 2025-01-17 ENCOUNTER — Encounter (HOSPITAL_COMMUNITY)

## 2025-01-21 ENCOUNTER — Ambulatory Visit: Admitting: Physician Assistant

## 2025-01-24 ENCOUNTER — Encounter (HOSPITAL_COMMUNITY)

## 2025-01-30 ENCOUNTER — Encounter (HOSPITAL_COMMUNITY)

## 2025-02-17 ENCOUNTER — Ambulatory Visit: Admitting: Physician Assistant
